# Patient Record
Sex: Female | Born: 1940 | Race: White | Hispanic: No | Smoking: Never smoker
Health system: Southern US, Community
[De-identification: ages and names within clinical notes are randomized; demographics above are authoritative.]

## PROBLEM LIST (undated history)

## (undated) DIAGNOSIS — I4891 Unspecified atrial fibrillation: Secondary | ICD-10-CM

## (undated) DIAGNOSIS — I480 Paroxysmal atrial fibrillation: Secondary | ICD-10-CM

## (undated) DIAGNOSIS — I471 Supraventricular tachycardia, unspecified: Secondary | ICD-10-CM

## (undated) DIAGNOSIS — N183 Chronic kidney disease, stage 3 unspecified: Secondary | ICD-10-CM

## (undated) DIAGNOSIS — I10 Essential (primary) hypertension: Secondary | ICD-10-CM

## (undated) DIAGNOSIS — I440 Atrioventricular block, first degree: Secondary | ICD-10-CM

## (undated) DIAGNOSIS — I495 Sick sinus syndrome: Secondary | ICD-10-CM

## (undated) DIAGNOSIS — D649 Anemia, unspecified: Secondary | ICD-10-CM

## (undated) DIAGNOSIS — I5032 Chronic diastolic (congestive) heart failure: Secondary | ICD-10-CM

## (undated) DIAGNOSIS — C50919 Malignant neoplasm of unspecified site of unspecified female breast: Secondary | ICD-10-CM

## (undated) DIAGNOSIS — I509 Heart failure, unspecified: Secondary | ICD-10-CM

## (undated) DIAGNOSIS — J910 Malignant pleural effusion: Secondary | ICD-10-CM

## (undated) DIAGNOSIS — K219 Gastro-esophageal reflux disease without esophagitis: Secondary | ICD-10-CM

## (undated) DIAGNOSIS — C801 Malignant (primary) neoplasm, unspecified: Secondary | ICD-10-CM

## (undated) HISTORY — DX: Sick sinus syndrome: I49.5

## (undated) HISTORY — PX: CHOLECYSTECTOMY: SHX55

## (undated) HISTORY — DX: Atrioventricular block, first degree: I44.0

---
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
## 1998-07-23 ENCOUNTER — Emergency Department (HOSPITAL_COMMUNITY): Admission: EM | Admit: 1998-07-23 | Discharge: 1998-07-24 | Payer: Self-pay | Admitting: Emergency Medicine

## 2003-07-10 ENCOUNTER — Emergency Department (HOSPITAL_COMMUNITY): Admission: EM | Admit: 2003-07-10 | Discharge: 2003-07-10 | Payer: Self-pay | Admitting: Emergency Medicine

## 2015-12-01 DIAGNOSIS — I10 Essential (primary) hypertension: Secondary | ICD-10-CM | POA: Insufficient documentation

## 2018-02-06 DIAGNOSIS — N183 Chronic kidney disease, stage 3 unspecified: Secondary | ICD-10-CM | POA: Diagnosis present

## 2018-10-05 ENCOUNTER — Encounter (HOSPITAL_COMMUNITY): Payer: Self-pay | Admitting: Emergency Medicine

## 2018-10-05 ENCOUNTER — Emergency Department (HOSPITAL_COMMUNITY)
Admission: EM | Admit: 2018-10-05 | Discharge: 2018-10-05 | Disposition: A | Discharge status: Home / Self Care | Payer: Medicare HMO | Attending: Emergency Medicine | Admitting: Emergency Medicine

## 2018-10-05 ENCOUNTER — Emergency Department (HOSPITAL_COMMUNITY): Payer: Medicare HMO

## 2018-10-05 ENCOUNTER — Other Ambulatory Visit: Payer: Self-pay

## 2018-10-05 DIAGNOSIS — I471 Supraventricular tachycardia: Secondary | ICD-10-CM | POA: Diagnosis not present

## 2018-10-05 DIAGNOSIS — Y929 Unspecified place or not applicable: Secondary | ICD-10-CM | POA: Diagnosis not present

## 2018-10-05 DIAGNOSIS — R0602 Shortness of breath: Secondary | ICD-10-CM | POA: Diagnosis present

## 2018-10-05 DIAGNOSIS — I10 Essential (primary) hypertension: Secondary | ICD-10-CM | POA: Insufficient documentation

## 2018-10-05 DIAGNOSIS — X58XXXA Exposure to other specified factors, initial encounter: Secondary | ICD-10-CM | POA: Diagnosis not present

## 2018-10-05 DIAGNOSIS — S21001A Unspecified open wound of right breast, initial encounter: Secondary | ICD-10-CM | POA: Insufficient documentation

## 2018-10-05 DIAGNOSIS — J181 Lobar pneumonia, unspecified organism: Secondary | ICD-10-CM | POA: Insufficient documentation

## 2018-10-05 DIAGNOSIS — J189 Pneumonia, unspecified organism: Secondary | ICD-10-CM

## 2018-10-05 DIAGNOSIS — Y999 Unspecified external cause status: Secondary | ICD-10-CM | POA: Diagnosis not present

## 2018-10-05 DIAGNOSIS — Z20828 Contact with and (suspected) exposure to other viral communicable diseases: Secondary | ICD-10-CM | POA: Insufficient documentation

## 2018-10-05 DIAGNOSIS — Y939 Activity, unspecified: Secondary | ICD-10-CM | POA: Insufficient documentation

## 2018-10-05 HISTORY — DX: Essential (primary) hypertension: I10

## 2018-10-05 HISTORY — DX: Gastro-esophageal reflux disease without esophagitis: K21.9

## 2018-10-05 LAB — BRAIN NATRIURETIC PEPTIDE: B Natriuretic Peptide: 226.8 pg/mL — ABNORMAL HIGH (ref 0.0–100.0)

## 2018-10-05 LAB — CBC WITH DIFFERENTIAL/PLATELET
Abs Immature Granulocytes: 0.12 10*3/uL — ABNORMAL HIGH (ref 0.00–0.07)
Basophils Absolute: 0.1 10*3/uL (ref 0.0–0.1)
Basophils Relative: 1 %
Eosinophils Absolute: 0.1 10*3/uL (ref 0.0–0.5)
Eosinophils Relative: 1 %
HCT: 39.8 % (ref 36.0–46.0)
Hemoglobin: 12.3 g/dL (ref 12.0–15.0)
Immature Granulocytes: 1 %
Lymphocytes Relative: 14 %
Lymphs Abs: 1.5 10*3/uL (ref 0.7–4.0)
MCH: 29.1 pg (ref 26.0–34.0)
MCHC: 30.9 g/dL (ref 30.0–36.0)
MCV: 94.3 fL (ref 80.0–100.0)
Monocytes Absolute: 0.8 10*3/uL (ref 0.1–1.0)
Monocytes Relative: 8 %
Neutro Abs: 8 10*3/uL — ABNORMAL HIGH (ref 1.7–7.7)
Neutrophils Relative %: 75 %
Platelets: 319 10*3/uL (ref 150–400)
RBC: 4.22 MIL/uL (ref 3.87–5.11)
RDW: 14.7 % (ref 11.5–15.5)
WBC: 10.6 10*3/uL — ABNORMAL HIGH (ref 4.0–10.5)
nRBC: 0 % (ref 0.0–0.2)

## 2018-10-05 LAB — COMPREHENSIVE METABOLIC PANEL WITH GFR
ALT: 20 U/L (ref 0–44)
AST: 20 U/L (ref 15–41)
Albumin: 3.3 g/dL — ABNORMAL LOW (ref 3.5–5.0)
Alkaline Phosphatase: 82 U/L (ref 38–126)
Anion gap: 11 (ref 5–15)
BUN: 15 mg/dL (ref 8–23)
CO2: 25 mmol/L (ref 22–32)
Calcium: 9.2 mg/dL (ref 8.9–10.3)
Chloride: 103 mmol/L (ref 98–111)
Creatinine, Ser: 1.38 mg/dL — ABNORMAL HIGH (ref 0.44–1.00)
GFR calc Af Amer: 42 mL/min — ABNORMAL LOW
GFR calc non Af Amer: 36 mL/min — ABNORMAL LOW
Glucose, Bld: 110 mg/dL — ABNORMAL HIGH (ref 70–99)
Potassium: 4 mmol/L (ref 3.5–5.1)
Sodium: 139 mmol/L (ref 135–145)
Total Bilirubin: 0.6 mg/dL (ref 0.3–1.2)
Total Protein: 6.5 g/dL (ref 6.5–8.1)

## 2018-10-05 LAB — TSH: TSH: 3.441 u[IU]/mL (ref 0.350–4.500)

## 2018-10-05 LAB — T4, FREE: Free T4: 1.1 ng/dL (ref 0.61–1.12)

## 2018-10-05 LAB — SARS CORONAVIRUS 2 BY RT PCR (HOSPITAL ORDER, PERFORMED IN ~~LOC~~ HOSPITAL LAB): SARS Coronavirus 2: NEGATIVE

## 2018-10-05 MED ORDER — CEPHALEXIN 500 MG PO CAPS: 500.0000 mg | ORAL_CAPSULE | Freq: Two times a day (BID) | ORAL | 0 refills | Status: DC

## 2018-10-05 MED ORDER — FLUCONAZOLE 100 MG PO TABS
100.0000 mg | ORAL_TABLET | Freq: Once | ORAL | Status: DC
  Filled 2018-10-05: qty 1

## 2018-10-05 MED ORDER — DOXYCYCLINE HYCLATE 100 MG PO TABS
100.0000 mg | ORAL_TABLET | Freq: Once | ORAL | Status: AC
  Administered 2018-10-05: 100 mg via ORAL
  Filled 2018-10-05: qty 1

## 2018-10-05 MED ORDER — DOXYCYCLINE HYCLATE 100 MG PO CAPS: 100.0000 mg | ORAL_CAPSULE | Freq: Two times a day (BID) | ORAL | 0 refills | Status: DC

## 2018-10-05 MED ORDER — CEPHALEXIN 250 MG PO CAPS
500.0000 mg | ORAL_CAPSULE | Freq: Once | ORAL | Status: AC
  Administered 2018-10-05: 500 mg via ORAL
  Filled 2018-10-05: qty 2

## 2018-10-05 NOTE — ED Notes (Signed)
Pt verbalizes understanding of this visit to the ED, follow-up care and consultations discussed with pt, medications and prescriptions were reviewed. Opportunity for questions and answers provided.

## 2018-10-05 NOTE — ED Provider Notes (Signed)
[facility] DEPART[facility] Provider Note   CSN: [id number] Arrival date & time: 10/05/18  1648     History   Chief Complaint Chief Complaint  Patient presents with  . Shortness of Breath  . Tachycardia    HPI Beverly Tucker is a 78 y.o. female.     HPI   78 year old female with a history of hypertension, situational anxiety, chronic knee pain, GERD who presents with concern for shortness of breath.  Reports has had dyspnea for approximately 2 weeks.  It is worse with exertion and when she lays down flat.  Notes some bilateral lower extremity swelling.  She is also had cough, and sensation of something in the back of her throat.  Cough has been nonproductive but feels like she needs to cough something up.  Feels like the shortness of breath is coming from the back of her throat.  Reports some nasal congestion.  Denies any fevers, nausea, vomiting, diarrhea, black or bloody stools.  Her primary care physician put her on prednisone and she believes another antibiotic.  Denies taking any other over-the-counter cough or cold medications.  No known sick contacts over this with COVID-19.  No history of cardiac arrhythmia or palpitations.  Reports she is normally very active, mowing her lawn, but has not felt up to it in the last 2 weeks.  Reports for the last 3 to 4 weeks, she has had worsening changes under her right breast.  Describes initially redness in the area that she thought was secondary to her underwire bra.  Reports that she had stopped wearing the underwire bra, but believes she was out in the heat, and that sweat and moisture made the problem under her right breast worse.  She now has ulceration and some draining.  Denies any significant pain in the area, reports that she has more pruritus.  Has never had anything like this before.  She has not yet seen her physician regarding this.  Past Medical History:  Diagnosis Date  . GERD (gastroesophageal reflux  disease)   . Hypertension        OB History   No obstetric history on file.      Home Medications    Prior to Admission medications   Medication Sig Start Date End Date Taking? Authorizing Provider  cephALEXin (KEFLEX) 500 MG capsule Take 1 capsule (500 mg total) by mouth 2 (two) times daily. 10/05/18   Gareth Morgan, MD  doxycycline (VIBRAMYCIN) 100 MG capsule Take 1 capsule (100 mg total) by mouth 2 (two) times daily. 10/05/18   Gareth Morgan, MD    Family History No family history on file.  Social History Social History   Tobacco Use  . Smoking status: Not on file  Substance Use Topics  . Alcohol use: Not on file  . Drug use: Not on file     Allergies   Patient has no known allergies.   Review of Systems Review of Systems  Constitutional: Positive for fatigue. Negative for fever.  HENT: Positive for congestion. Negative for sore throat.   Eyes: Negative for visual disturbance.  Respiratory: Positive for cough and shortness of breath.   Cardiovascular: Positive for palpitations and leg swelling. Negative for chest pain.  Gastrointestinal: Negative for abdominal pain, nausea and vomiting.  Genitourinary: Negative for difficulty urinating.  Musculoskeletal: Negative for back pain and neck pain.  Skin: Positive for wound. Negative for rash.  Neurological: Negative for syncope and headaches.     Physical  Exam Updated Vital Signs BP (!) 168/74 (BP Location: Right Arm)   Pulse 86   Temp 98.3 F (36.8 C) (Oral)   Resp (!) 24   SpO2 96%   Physical Exam Vitals signs and nursing note reviewed.  Constitutional:      General: She is not in acute distress.    Appearance: She is well-developed. She is not diaphoretic.  HENT:     Head: Normocephalic and atraumatic.  Eyes:     Conjunctiva/sclera: Conjunctivae normal.  Neck:     Musculoskeletal: Normal range of motion.  Cardiovascular:     Rate and Rhythm: Regular rhythm. Tachycardia present.     Heart  sounds: Normal heart sounds. No murmur. No friction rub. No gallop.   Pulmonary:     Effort: Pulmonary effort is normal. No respiratory distress.     Breath sounds: Rales present. No wheezing.  Chest:     Comments: Right breast underside with erythema, ulcerations, nodularity, no fluctuance Abdominal:     General: There is no distension.     Palpations: Abdomen is soft.     Tenderness: There is no abdominal tenderness. There is no guarding.  Musculoskeletal:        General: No tenderness.  Skin:    General: Skin is warm and dry.     Findings: No erythema or rash.  Neurological:     Mental Status: She is alert and oriented to person, place, and time.      ED Treatments / Results  Labs (all labs ordered are listed, but only abnormal results are displayed) Labs Reviewed  CBC WITH DIFFERENTIAL/PLATELET - Abnormal; Notable for the following components:      Result Value   WBC 10.6 (*)    Neutro Abs 8.0 (*)    Abs Immature Granulocytes 0.12 (*)    All other components within normal limits  COMPREHENSIVE METABOLIC PANEL - Abnormal; Notable for the following components:   Glucose, Bld 110 (*)    Creatinine, Ser 1.38 (*)    Albumin 3.3 (*)    GFR calc non Af Amer 36 (*)    GFR calc Af Amer 42 (*)    All other components within normal limits  BRAIN NATRIURETIC PEPTIDE - Abnormal; Notable for the following components:   B Natriuretic Peptide 226.8 (*)    All other components within normal limits  SARS CORONAVIRUS 2 (HOSPITAL ORDER, [facility])  TSH  T4, FREE    EKG EKG Interpretation  Date/Time:  Saturday October 05 2018 17:23:59 EDT Ventricular Rate:  92 PR Interval:    QRS Duration: 100 QT Interval:  331 QTC Calculation: 410 R Axis:   -30 Text Interpretation:  Sinus rhythm Atrial premature complex Prolonged PR interval Left axis deviation Low voltage, precordial leads Abnormal R-wave progression, early transition Since prior ECG, rhythm is  now sinus, rate has slowed Confirmed by Gareth Morgan [phone]) on 10/05/2018 5:39:43 PM   Radiology Dg Chest Portable 1 View  Result Date: 10/05/2018 CLINICAL DATA:  Shortness of breath for 2 weeks. EXAM: PORTABLE CHEST 1 VIEW COMPARISON:  None. FINDINGS: External pacing wires overlie the thorax. Cardiomediastinal silhouette is normal. Mediastinal contours appear intact. Left lower lobe airspace consolidation versus atelectasis. Osseous structures are without acute abnormality. Soft tissues are grossly normal. IMPRESSION: Left lower lobe airspace consolidation versus atelectasis. Electronically Signed   By: Fidela Salisbury M.D.   On: 10/05/2018 18:08    Procedures .Critical Care Performed by: Gareth Morgan, MD  Authorized by: Gareth Morgan, MD   Critical care provider statement:    Critical care time (minutes):  30   Critical care was time spent personally by me on the following activities:  Discussions with consultants, evaluation of patient's response to treatment, examination of patient, ordering and performing treatments and interventions, ordering and review of laboratory studies, ordering and review of radiographic studies, pulse oximetry, re-evaluation of patient's condition, obtaining history from patient or surrogate and review of old charts   (including critical care time)  Medications Ordered in ED Medications  fluconazole (DIFLUCAN) tablet 100 mg (100 mg Oral Not Given 10/05/18 2125)  doxycycline (VIBRA-TABS) tablet 100 mg (100 mg Oral Given 10/05/18 2046)  cephALEXin (KEFLEX) capsule 500 mg (500 mg Oral Given 10/05/18 2046)     Initial Impression / Assessment and Plan / ED Course  I have reviewed the triage vital signs and the nursing notes.  Pertinent labs & imaging results that were available during my care of the patient were reviewed by me and considered in my medical decision making (see chart for details).        78 year old female with a history of  hypertension, situational anxiety, chronic knee pain, GERD who presents with concern for shortness of breath.  On arrival to the emergency department, patient is in a supraventricular tachycardia.  On arrival, heart rate is in the 150s, and blood pressures down into the 0000000 systolic.    Attempted vagal maneuvers without change into sinus rhythm, however shortly after attempted vagal maneuvers, as we are discussing adenosine, patient converted to a normal sinus rhythm and her blood pressures improved.  Labs completed showing Cr of 1.38, last 1 in 05/2017. WBC 10.9. BNP 200s.  Chest x-ray shows left sided pneumonia. Bicarb normal, no sign of acidosis.   COVID19 testing negative.  Patient remained in sinus rhythm while in the emergency department, without hypoxia, breathing comfortably.  Do not see indication for admission at this time, however discussed strict return precautions.  For SVT, recommend Cardiology follow up and discussed this with Dr. Radford Pax who also looked at initial ECG. Suspect this was brought on by pneumonia by history. Given doxycyline, keflex rx for pneumonia.   Also noted patient with ulcerations under right breast with ddx including multiple etiologies. Will cover for possible fungal component with fluconazole, give doxycycline for bacterial infection, recommend wound care and OBGYN consult rec for likely breast center imaging.   Patient to follow-up with cardiology for SVT, primary care physician for pneumonia and recheck of creatinine, and either OB/GYN or primary care physician for evaluation of her breast wound and referral to the breast center, as well as follow-up with wound care.  Beverly Tucker was evaluated in Emergency Department on 10/05/2018 for the symptoms described in the history of present illness. She was evaluated in the context of the global COVID-19 pandemic, which necessitated consideration that the patient might be at risk for infection with the SARS-CoV-2  virus that causes COVID-19. Institutional protocols and algorithms that pertain to the evaluation of patients at risk for COVID-19 are in a state of rapid change based on information released by regulatory bodies including the CDC and federal and state organizations. These policies and algorithms were followed during the patient's care in the ED.   Final Clinical Impressions(s) / ED Diagnoses   Final diagnoses:  SVT (supraventricular tachycardia) ([employer])  Community acquired pneumonia of left lower lobe of lung ([facility])  Wound of right breast, initial encounter  ED Discharge Orders         Ordered    cephALEXin (KEFLEX) 500 MG capsule  2 times daily     10/05/18 2043    doxycycline (VIBRAMYCIN) 100 MG capsule  2 times daily     10/05/18 2043           Gareth Morgan, MD 10/05/18 2141

## 2018-10-05 NOTE — ED Notes (Signed)
Patient verbalizes understanding of discharge instructions. Opportunity for questioning and answers were provided. Armband removed by staff, pt discharged from ED.  

## 2018-10-05 NOTE — ED Triage Notes (Signed)
Pt brought in by [facility] from home for SOB x2 weeks. Per EMS pt HR 150 on arrival. Pt denies pain, denies CP. During triage, a wound was noted under pt right breast. Pt states this is from a wire bra that caused irritation x2 weeks ago. Denies pain to the site, dressing changed immediately on arrival to ED. Pt states she has not been evaluated for the wound.

## 2018-10-05 NOTE — ED Notes (Signed)
Called granddaughter to come pick pt up. Granddaughter stated she would be here in 10 min.

## 2018-10-14 ENCOUNTER — Other Ambulatory Visit: Payer: Self-pay

## 2018-10-14 ENCOUNTER — Inpatient Hospital Stay (HOSPITAL_COMMUNITY)
Admission: EM | Admit: 2018-10-14 | Discharge: 2018-10-23 | DRG: 584 | Disposition: A | Discharge status: Home Health | Payer: Medicare HMO | Attending: Internal Medicine | Admitting: Internal Medicine

## 2018-10-14 ENCOUNTER — Emergency Department (HOSPITAL_COMMUNITY): Payer: Medicare HMO

## 2018-10-14 ENCOUNTER — Inpatient Hospital Stay (HOSPITAL_COMMUNITY): Payer: Medicare HMO

## 2018-10-14 ENCOUNTER — Encounter (HOSPITAL_COMMUNITY): Payer: Self-pay | Admitting: Emergency Medicine

## 2018-10-14 DIAGNOSIS — R0902 Hypoxemia: Secondary | ICD-10-CM | POA: Diagnosis not present

## 2018-10-14 DIAGNOSIS — M795 Residual foreign body in soft tissue: Secondary | ICD-10-CM

## 2018-10-14 DIAGNOSIS — R0982 Postnasal drip: Secondary | ICD-10-CM | POA: Diagnosis present

## 2018-10-14 DIAGNOSIS — R590 Localized enlarged lymph nodes: Secondary | ICD-10-CM | POA: Diagnosis present

## 2018-10-14 DIAGNOSIS — R7881 Bacteremia: Secondary | ICD-10-CM

## 2018-10-14 DIAGNOSIS — I129 Hypertensive chronic kidney disease with stage 1 through stage 4 chronic kidney disease, or unspecified chronic kidney disease: Secondary | ICD-10-CM | POA: Diagnosis present

## 2018-10-14 DIAGNOSIS — J910 Malignant pleural effusion: Secondary | ICD-10-CM | POA: Diagnosis present

## 2018-10-14 DIAGNOSIS — I4891 Unspecified atrial fibrillation: Secondary | ICD-10-CM | POA: Diagnosis not present

## 2018-10-14 DIAGNOSIS — Z9049 Acquired absence of other specified parts of digestive tract: Secondary | ICD-10-CM | POA: Diagnosis not present

## 2018-10-14 DIAGNOSIS — J90 Pleural effusion, not elsewhere classified: Secondary | ICD-10-CM

## 2018-10-14 DIAGNOSIS — Z20828 Contact with and (suspected) exposure to other viral communicable diseases: Secondary | ICD-10-CM | POA: Diagnosis present

## 2018-10-14 DIAGNOSIS — C7951 Secondary malignant neoplasm of bone: Secondary | ICD-10-CM | POA: Diagnosis present

## 2018-10-14 DIAGNOSIS — J939 Pneumothorax, unspecified: Secondary | ICD-10-CM

## 2018-10-14 DIAGNOSIS — F419 Anxiety disorder, unspecified: Secondary | ICD-10-CM | POA: Diagnosis present

## 2018-10-14 DIAGNOSIS — R7989 Other specified abnormal findings of blood chemistry: Secondary | ICD-10-CM | POA: Diagnosis not present

## 2018-10-14 DIAGNOSIS — N183 Chronic kidney disease, stage 3 unspecified: Secondary | ICD-10-CM | POA: Diagnosis present

## 2018-10-14 DIAGNOSIS — Z170 Estrogen receptor positive status [ER+]: Secondary | ICD-10-CM

## 2018-10-14 DIAGNOSIS — K219 Gastro-esophageal reflux disease without esophagitis: Secondary | ICD-10-CM | POA: Diagnosis present

## 2018-10-14 DIAGNOSIS — R918 Other nonspecific abnormal finding of lung field: Secondary | ICD-10-CM | POA: Diagnosis present

## 2018-10-14 DIAGNOSIS — R0602 Shortness of breath: Secondary | ICD-10-CM

## 2018-10-14 DIAGNOSIS — I482 Chronic atrial fibrillation, unspecified: Secondary | ICD-10-CM | POA: Diagnosis not present

## 2018-10-14 DIAGNOSIS — I2510 Atherosclerotic heart disease of native coronary artery without angina pectoris: Secondary | ICD-10-CM | POA: Diagnosis present

## 2018-10-14 DIAGNOSIS — N6310 Unspecified lump in the right breast, unspecified quadrant: Secondary | ICD-10-CM | POA: Diagnosis not present

## 2018-10-14 DIAGNOSIS — Z800 Family history of malignant neoplasm of digestive organs: Secondary | ICD-10-CM | POA: Diagnosis not present

## 2018-10-14 DIAGNOSIS — C50911 Malignant neoplasm of unspecified site of right female breast: Secondary | ICD-10-CM | POA: Diagnosis present

## 2018-10-14 DIAGNOSIS — N189 Chronic kidney disease, unspecified: Secondary | ICD-10-CM | POA: Diagnosis not present

## 2018-10-14 DIAGNOSIS — J9811 Atelectasis: Secondary | ICD-10-CM | POA: Diagnosis not present

## 2018-10-14 DIAGNOSIS — R112 Nausea with vomiting, unspecified: Secondary | ICD-10-CM

## 2018-10-14 DIAGNOSIS — I509 Heart failure, unspecified: Secondary | ICD-10-CM | POA: Diagnosis not present

## 2018-10-14 DIAGNOSIS — Z79899 Other long term (current) drug therapy: Secondary | ICD-10-CM | POA: Diagnosis not present

## 2018-10-14 DIAGNOSIS — Z7901 Long term (current) use of anticoagulants: Secondary | ICD-10-CM | POA: Diagnosis not present

## 2018-10-14 DIAGNOSIS — I10 Essential (primary) hypertension: Secondary | ICD-10-CM | POA: Diagnosis not present

## 2018-10-14 DIAGNOSIS — R079 Chest pain, unspecified: Secondary | ICD-10-CM

## 2018-10-14 DIAGNOSIS — C50919 Malignant neoplasm of unspecified site of unspecified female breast: Secondary | ICD-10-CM | POA: Diagnosis not present

## 2018-10-14 DIAGNOSIS — R05 Cough: Secondary | ICD-10-CM

## 2018-10-14 DIAGNOSIS — I130 Hypertensive heart and chronic kidney disease with heart failure and stage 1 through stage 4 chronic kidney disease, or unspecified chronic kidney disease: Secondary | ICD-10-CM | POA: Diagnosis not present

## 2018-10-14 DIAGNOSIS — R059 Cough, unspecified: Secondary | ICD-10-CM

## 2018-10-14 DIAGNOSIS — R0600 Dyspnea, unspecified: Secondary | ICD-10-CM

## 2018-10-14 LAB — CBC WITH DIFFERENTIAL/PLATELET
Abs Immature Granulocytes: 0.1 10*3/uL — ABNORMAL HIGH (ref 0.00–0.07)
Basophils Absolute: 0.1 10*3/uL (ref 0.0–0.1)
Basophils Relative: 1 %
Eosinophils Absolute: 0.1 10*3/uL (ref 0.0–0.5)
Eosinophils Relative: 1 %
HCT: 40.5 % (ref 36.0–46.0)
Hemoglobin: 12.6 g/dL (ref 12.0–15.0)
Immature Granulocytes: 1 %
Lymphocytes Relative: 16 %
Lymphs Abs: 1.6 10*3/uL (ref 0.7–4.0)
MCH: 29.3 pg (ref 26.0–34.0)
MCHC: 31.1 g/dL (ref 30.0–36.0)
MCV: 94.2 fL (ref 80.0–100.0)
Monocytes Absolute: 0.8 10*3/uL (ref 0.1–1.0)
Monocytes Relative: 8 %
Neutro Abs: 7.7 10*3/uL (ref 1.7–7.7)
Neutrophils Relative %: 73 %
Platelets: 334 10*3/uL (ref 150–400)
RBC: 4.3 MIL/uL (ref 3.87–5.11)
RDW: 14.9 % (ref 11.5–15.5)
WBC: 10.3 10*3/uL (ref 4.0–10.5)
nRBC: 0 % (ref 0.0–0.2)

## 2018-10-14 LAB — COMPREHENSIVE METABOLIC PANEL
ALT: 16 U/L (ref 0–44)
AST: 20 U/L (ref 15–41)
Albumin: 3.3 g/dL — ABNORMAL LOW (ref 3.5–5.0)
Alkaline Phosphatase: 97 U/L (ref 38–126)
Anion gap: 13 (ref 5–15)
BUN: 24 mg/dL — ABNORMAL HIGH (ref 8–23)
CO2: 27 mmol/L (ref 22–32)
Calcium: 9.3 mg/dL (ref 8.9–10.3)
Chloride: 100 mmol/L (ref 98–111)
Creatinine, Ser: 1.34 mg/dL — ABNORMAL HIGH (ref 0.44–1.00)
GFR calc Af Amer: 44 mL/min — ABNORMAL LOW (ref >60)
GFR calc non Af Amer: 38 mL/min — ABNORMAL LOW (ref >60)
Glucose, Bld: 111 mg/dL — ABNORMAL HIGH (ref 70–99)
Potassium: 4 mmol/L (ref 3.5–5.1)
Sodium: 140 mmol/L (ref 135–145)
Total Bilirubin: 0.7 mg/dL (ref 0.3–1.2)
Total Protein: 6.6 g/dL (ref 6.5–8.1)

## 2018-10-14 LAB — LACTIC ACID, PLASMA
Lactic Acid, Venous: 3 mmol/L (ref 0.5–1.9)
Lactic Acid, Venous: 1.9 mmol/L (ref 0.5–1.9)

## 2018-10-14 LAB — BRAIN NATRIURETIC PEPTIDE: B Natriuretic Peptide: 458.5 pg/mL — ABNORMAL HIGH (ref 0.0–100.0)

## 2018-10-14 LAB — SARS CORONAVIRUS 2 BY RT PCR (HOSPITAL ORDER, PERFORMED IN ~~LOC~~ HOSPITAL LAB): SARS Coronavirus 2: NEGATIVE

## 2018-10-14 MED ORDER — ENOXAPARIN SODIUM 40 MG/0.4ML ~~LOC~~ SOLN
40.0000 mg | SUBCUTANEOUS | Status: DC
  Administered 2018-10-15 – 2018-10-16 (×2): 40 mg via SUBCUTANEOUS
  Filled 2018-10-14 (×3): qty 0.4

## 2018-10-14 MED ORDER — ALPRAZOLAM 0.25 MG PO TABS
0.2500 mg | ORAL_TABLET | Freq: Two times a day (BID) | ORAL | Status: DC | PRN
  Administered 2018-10-14 – 2018-10-16 (×2): 0.5 mg via ORAL
  Administered 2018-10-18: 0.25 mg via ORAL
  Administered 2018-10-20 – 2018-10-22 (×4): 0.5 mg via ORAL
  Filled 2018-10-14 (×2): qty 2
  Filled 2018-10-14: qty 1
  Filled 2018-10-14 (×4): qty 2

## 2018-10-14 MED ORDER — VANCOMYCIN HCL 10 G IV SOLR
1750.0000 mg | Freq: Once | INTRAVENOUS | Status: DC
  Filled 2018-10-14: qty 1750

## 2018-10-14 MED ORDER — PIPERACILLIN-TAZOBACTAM 4.5 G IVPB: 4.5000 g | Freq: Once | INTRAVENOUS | Status: DC

## 2018-10-14 MED ORDER — ACETAMINOPHEN 325 MG PO TABS
650.0000 mg | ORAL_TABLET | Freq: Four times a day (QID) | ORAL | Status: DC | PRN
  Administered 2018-10-15 (×2): 650 mg via ORAL
  Filled 2018-10-14 (×3): qty 2

## 2018-10-14 MED ORDER — FUROSEMIDE 10 MG/ML IJ SOLN
40.0000 mg | Freq: Every day | INTRAMUSCULAR | Status: DC
  Administered 2018-10-14 – 2018-10-15 (×2): 40 mg via INTRAVENOUS
  Filled 2018-10-14 (×2): qty 4

## 2018-10-14 MED ORDER — METOPROLOL TARTRATE 25 MG PO TABS: 50.0000 mg | ORAL_TABLET | Freq: Two times a day (BID) | ORAL | Status: DC

## 2018-10-14 MED ORDER — ACETAMINOPHEN 650 MG RE SUPP: 650.0000 mg | Freq: Four times a day (QID) | RECTAL | Status: DC | PRN

## 2018-10-14 MED ORDER — FUROSEMIDE 10 MG/ML IJ SOLN: 40.0000 mg | Freq: Once | INTRAMUSCULAR | Status: DC

## 2018-10-14 MED ORDER — PIPERACILLIN-TAZOBACTAM 3.375 G IVPB 30 MIN: 3.3750 g | Freq: Once | INTRAVENOUS | Status: DC

## 2018-10-14 NOTE — H&P (Addendum)
Date: 10/14/2018               Patient Name:  Beverly Tucker MRN: [id number]  DOB: Jun 11, 1940 Age / Sex: 78 y.o., female   PCP: Dr. Evette Doffing         Medical Service: Internal Medicine Teaching Service         Attending Physician: Dr. Evette Doffing, Mallie Mussel, *    First Contact: Dr. Jeanmarie Hubert Pager: [phone]  Second Contact: Dr. Kathi Ludwig Pager: [phone]       After Hours (After 5p/  First Contact Pager: [phone]  weekends / holidays): Second Contact Pager: [phone]   Chief Complaint: Shortness of breath  History of Present Illness: Beverly Tucker is a 78 year old female with past medical history of hypertension who presents with a 3-week history of progressive worsening of shortness of breath.  Patient reports that symptoms started with a runny nose and cough productive of clear white mucus.  Patient saw outpatient provider, was COVID negative, was started on allergy medication.  Shortness of breath continue to progress, saw provider on 9/12, was diagnosed with pneumonia, and started on Keflex.  At that visit, patient was also started on doxycycline for cellulitis (under right breast).  Patient reports taking both medications consistently every day.  Patient reports that shortness of breath is worse with exertion, worse when lying flat, reports sleeping with 2 pillows, denies PND symptoms.  Patient denies fever, chills, muscle aches, weight loss/gain, abdominal pain, urinary symptoms, diarrhea.  Reports 1 episode of nonbilious, nonbloody vomit last Saturday.  Patient reports lower extremity swelling over the past 2 days.  States last colonoscopy was 3 to 4 years ago, not sure when she is supposed to follow-up again.  Patient reports never having a mammogram.  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 Patient reports lesion under right breast which is non-tender and has been  developing for the past couple of months. She has been taking doxycycline as this was thought to be cellulitis.  PCP is Beverly Tucker with [facility]  Meds:  Current Meds  Medication Sig   ALPRAZolam (XANAX) 0.5 MG tablet Take 0.25-0.5 mg by mouth 2 (two) times daily as needed for anxiety.   cephALEXin (KEFLEX) 500 MG capsule Take 1 capsule (500 mg total) by mouth 2 (two) times daily.   doxycycline (VIBRAMYCIN) 100 MG capsule Take 1 capsule (100 mg total) by mouth 2 (two) times daily.   lisinopril-hydrochlorothiazide (ZESTORETIC) 10-12.5 MG tablet Take 1 tablet by mouth daily.   metoprolol tartrate (LOPRESSOR) 50 MG tablet Take 50 mg by mouth 2 (two) times daily.     Allergies: * Denies allergies to medications  Past Medical History: * Hypertension * Anxiety   Family History:  * Mother had liver cancer * Denies family history of breast cancer * Denies family history of colon cancer  Social History:  * Denies alcohol use * Denies ever smoking * Denies non-prescription drug use * Lives at home by herself, drives, performs all ADL/IADLs independently, child lives nearby * Retired, used to work for [employer]: A complete ROS was negative except as per HPI.  Physical Exam: Blood pressure (!) 150/64, pulse 73, temperature 99 F (37.2 C), temperature source Rectal, resp. rate (!) 31, height 5\' 6"  (1.676 m), weight 89.4 kg, SpO2 94 %. Physical Exam  Constitutional: She is well-developed, well-nourished,  and in no distress.  HENT:  Head: Normocephalic and atraumatic.  Eyes: EOM are normal. Right eye exhibits no discharge. Left eye exhibits no discharge.  Neck: Normal range of motion. No tracheal deviation present.  Cardiovascular: Normal rate and regular rhythm. Exam reveals no gallop and no friction rub.  No murmur heard. Pulmonary/Chest: Effort normal and breath sounds normal. No respiratory distress. She has no wheezes. She has  no rales.  Abdominal: Soft. She exhibits no distension. There is no abdominal tenderness. There is no rebound and no guarding.  Musculoskeletal: Normal range of motion.        General: Edema (2+ pitting edema up to mid-shins) present. No tenderness or deformity.  Neurological: She is alert. Coordination normal.  Skin: Skin is warm and dry. No rash noted. She is not diaphoretic. No erythema.  Erythematous, nodular, raised lesion under right breast - see photo below  Psychiatric: Memory and judgment normal.       EKG: personally reviewed my interpretation is normal sinus rhythm without signs of acute ischemia  CXR: personally reviewed my interpretation is moderate left-sided pleural effusion with possible associated atelectasis vs. airspace disease in bilateral bases.  Assessment & Plan by Problem: Principal Problem:   Acute CHF (congestive heart failure) (HCC) Active Problems:   Mass of breast, right   CKD (chronic kidney disease) stage 3, GFR 30-59 ml/min (HCC)   Pleural effusion, left  Patient is a 78 year old female with past medical history of hypertension and anxiety who presents with a 3-week history of progressive worsening shortness of breath.  # Acute CHF: Patient with 3-week history of progressively worsening shortness of breath.  Patient with left-sided pleural effusion and bibasilar airspace disease.  However, patient has normal white count, no fever, or malaise which lowers the suspicion for pneumonia.  Patient without history of smoking or chemical exposure. Saturating well on room air, no chest pain.  Patient does have 2+ pitting edema up to the mid shins, orthopnea, and BNP of 458. No history of heart failure or prior echocardiograms in system.  * Diurese with lasix 40 mg IV * Strict I/Os, daily weights, will monitor electrolytes. Potassium of 4.0 on presentation, will check Mag in AM * TTE to evaluate for heart failure * CT Chest to further characterize possible  pneumonia vs. Malignancy  # Right breast lesion: Raised, nodular, erythematous lesion (see picture above) under right breast which patient reports started a couple months ago. Wound is non-tender, without significant purulence, and non-painful. It is not characteristic of cellulitis. There is concern for malignant process. Will further evaluate with CT scan of the chest.   # Hypertension: On lisinopril-hydrochlorothiazide 10-12.5mg  QD at home. Systolics in the [id number] on presentation. Will hold home medications as we diurese patient.  * Lasix 40mg  IV QD  DVT Ppx: Lovenox 40 mg QD Diet: Heart diet Dispo: Admit patient to Inpatient with expected length of stay greater than 2 midnights.  Signed: Jeanmarie Hubert, MD 10/14/2018, 6:48 PM  Pager: [phone]

## 2018-10-14 NOTE — ED Provider Notes (Addendum)
[facility] EMERGENCY DEPARTMENT Provider Note   CSN: [id number] Arrival date & time: 10/14/18  1354     History   Chief Complaint Chief Complaint  Patient presents with  . Chest Pain    HPI Beverly Tucker is a 78 y.o. female with Hx of HTN presenting to the ED with complaints of shortness of breath and weakness. History obtained from the patient who is a poor historian and is unclear of exact timeline of events. Patient was last seen in the ED on 9/5 for dyspnea and found to have left sided pneumonia without signs of acidosis. COVID negative. Patient  Discharged to home with Keflex and Doxycycline. Patient reports that her symptoms have been improving; however, she does not feel like she is getting better as quickly as she would like. She still feels weak and unable to take care of herself. She continues to have productive cough that is improving and has some postnasal drip since Saturday. She endorses some dyspnea on exertion that has improved since her ED visit. Patient reports some increased ankle edema. She denies any fevers, chills, chest pain, recent COVID contacts, abdominal pain, nausea/vomiting, or diarrhea.    Past Medical History:  Diagnosis Date  . GERD (gastroesophageal reflux disease)   . Hypertension     There are no active problems to display for this patient.   History reviewed. No pertinent surgical history.   OB History   No obstetric history on file.      Home Medications    Prior to Admission medications   Medication Sig Start Date End Date Taking? Authorizing Provider  cephALEXin (KEFLEX) 500 MG capsule Take 1 capsule (500 mg total) by mouth 2 (two) times daily. 10/05/18   Gareth Morgan, MD  doxycycline (VIBRAMYCIN) 100 MG capsule Take 1 capsule (100 mg total) by mouth 2 (two) times daily. 10/05/18   Gareth Morgan, MD    Family History History reviewed. No pertinent family history.  Social History Social History   Tobacco  Use  . Smoking status: Not on file  Substance Use Topics  . Alcohol use: Not on file  . Drug use: Not on file     Allergies   Patient has no known allergies.   Review of Systems Review of Systems  Constitutional: Positive for fatigue. Negative for appetite change, chills and fever.  HENT: Positive for postnasal drip. Negative for congestion, rhinorrhea, sneezing and sore throat.   Eyes: Negative.   Respiratory: Positive for cough and shortness of breath. Negative for chest tightness and wheezing.        Productive cough  Cardiovascular: Positive for leg swelling. Negative for chest pain and palpitations.  Gastrointestinal: Positive for abdominal distention. Negative for abdominal pain, diarrhea, nausea and vomiting.  Endocrine: Negative.   Genitourinary: Negative.   Musculoskeletal: Negative.   Skin: Negative.   Allergic/Immunologic: Negative.   Neurological: Negative for dizziness, numbness and headaches.  Hematological: Negative.   Psychiatric/Behavioral: Negative.     Physical Exam Updated Vital Signs BP (!) 134/55   Pulse 66   Temp 99 F (37.2 C) (Rectal)   Resp (!) 25   SpO2 93%   Physical Exam Constitutional:      General: She is not in acute distress.    Appearance: She is ill-appearing.  HENT:     Head: Normocephalic and atraumatic.  Eyes:     Extraocular Movements: Extraocular movements intact.     Pupils: Pupils are equal, round, and reactive to light.  Cardiovascular:     Rate and Rhythm: Normal rate and regular rhythm.     Heart sounds: Heart sounds not distant. No murmur. No systolic murmur. No friction rub. No gallop.   Pulmonary:     Effort: Pulmonary effort is normal. No tachypnea, accessory muscle usage or respiratory distress.     Breath sounds: Examination of the left-lower field reveals decreased breath sounds. Decreased breath sounds present. No rhonchi or rales.  Chest:     Chest wall: Mass present. No tenderness.     Comments: Fungating  mass present medial to and underneath right breast (see below) Abdominal:     General: Bowel sounds are normal.     Palpations: Abdomen is soft.     Tenderness: There is no abdominal tenderness. There is no guarding.  Musculoskeletal:     Right lower leg: She exhibits no tenderness. Edema present.     Left lower leg: She exhibits no tenderness. Edema present.     Comments: 1+ pitting edema bilaterally  Skin:    General: Skin is warm and dry.     Capillary Refill: Capillary refill takes less than 2 seconds.  Neurological:     General: No focal deficit present.     Mental Status: She is alert and oriented to person, place, and time.       ED Treatments / Results  Labs (all labs ordered are listed, but only abnormal results are displayed) Labs Reviewed  CULTURE, BLOOD (ROUTINE X 2)  CULTURE, BLOOD (ROUTINE X 2)  SARS CORONAVIRUS 2 (HOSPITAL ORDER, [facility])  CBC WITH DIFFERENTIAL/PLATELET  COMPREHENSIVE METABOLIC PANEL  LACTIC ACID, PLASMA  LACTIC ACID, PLASMA  BRAIN NATRIURETIC PEPTIDE    EKG EKG Interpretation  Date/Time:  Monday October 14 2018 14:09:00 EDT Ventricular Rate:  69 PR Interval:    QRS Duration: 110 QT Interval:  433 QTC Calculation: 464 R Axis:   8 Text Interpretation:  Sinus rhythm Borderline prolonged PR interval Abnormal R-wave progression, early transition Confirmed by Lacretia Leigh ([id number]) on 10/14/2018 2:29:38 PM   Radiology Dg Chest Port 1 View  Result Date: 10/14/2018 CLINICAL DATA:  Shortness of breath EXAM: PORTABLE CHEST 1 VIEW COMPARISON:  Chest radiograph dated 10/05/2018. FINDINGS: The heart appears enlarged accounting for technique. There is a small a moderate left pleural effusion with associated atelectasis/airspace disease. There is mild right basilar atelectasis/airspace disease. There is no pneumothorax. The visualized skeletal structures are unremarkable. IMPRESSION: Small to moderate left pleural  effusion with associated atelectasis/airspace disease. Mild right basilar atelectasis/airspace disease. Cardiomegaly. Electronically Signed   By: Zerita Boers M.D.   On: 10/14/2018 14:38    Procedures Procedures (including critical care time)  Medications Ordered in ED Medications - No data to display   Initial Impression / Assessment and Plan / ED Course  I have reviewed the triage vital signs and the nursing notes.  Pertinent labs & imaging results that were available during my care of the patient were reviewed by me and considered in my medical decision making (see chart for details).  Beverly Tucker is a 78 yo female with Hx of HTN presenting to the ED from home with shortness of breath and weakness.   She was recently seen in the ED on 9/5 for dyspnea and SVTs with spontaneous conversion to NSR. CXR showed left sided pneumonia. Suspected that SVT was induced by pneumonia. Patient was discharged to home with doxycycline and Keflex. Also noted to have ulceration under right  breast and patient discharged with fluconazole, doxycycline and recommended for wound care and OB/GYN consult for further imaging.   Today, patient reports improvement in dyspnea and cough since being in the ED. However, she reports that she does not believe she is getting better as quickly as she would like. She is still having some productive cough that is improving and has some postnasal drip since Saturday. She reports improved dyspnea as well; however continues to have fatigue. On examination, patient appears unkept and disoriented however AOx4. She is afebrile and saturating well on room air. She does not appear to be in any acute distress. There is a nontender fungating mass with ulceration and minimal bleeding present under her right breast. No surrounding erythema. Lung sounds decreased on left side. JVD to mid-neck and 1+ pitting edema at bilateral lower extremities. CXR with left pleural effusion with associated  atelectasis and mild right basilar atelectasis. Cardiomegaly present on CXR.  Suspected patient's symptoms secondary to heart failure vs. malignant pleural effusion.   Lactic acid 3.0. Suspected source of pleural effusion. Patient given vancomycin and zosyn in ED.   BNP 450 (200 on 9/5). As patient appeared volume overloaded and Cr stable since last ED visit, patient given one dose of Lasix.    Patient to be admitted to inpatient service.   Final Clinical Impressions(s) / ED Diagnoses   Final diagnoses:  Chest pain    ED Discharge Orders    None       Harvie Heck, MD 10/14/18 1606    Harvie Heck, MD 10/14/18 1607    Harvie Heck, MD 10/14/18 1630    Lacretia Leigh, MD 10/15/18 1605

## 2018-10-14 NOTE — ED Triage Notes (Addendum)
Pt was at home with SOB and weakness. PNA 10 days ago with antibiotics. Ankles swollen new onset back pain as well. Sats on RA 94-95%. EMS did EKG. Breathing 30 times a minute when EMS arrived. Pt has large wound under left breast that appears like a fungating tumor.

## 2018-10-14 NOTE — ED Notes (Addendum)
Spoke to patient's son [name] over the phone and notified him that patient would be admitted to the hospital for investigation of right breast wound. Patient's son would appreciate phone call with updates on any new information.

## 2018-10-14 NOTE — ED Provider Notes (Signed)
I saw and evaluated the patient, reviewed the resident's note and I agree with the findings and plan.  EKG: EKG Interpretation  Date/Time:  Monday October 14 2018 14:09:00 EDT Ventricular Rate:  69 PR Interval:    QRS Duration: 110 QT Interval:  433 QTC Calculation: 464 R Axis:   8 Text Interpretation:  Sinus rhythm Borderline prolonged PR interval Abnormal R-wave progression, early transition Confirmed by Lacretia Leigh ([id number]) on 10/14/2018 2:83:50 PM  78 year old female presents with shortness of breath and not feeling back to her baseline is being treated for pneumonia in the past.  Chest x-ray shows CHF.  Some pedal edema.as well 2.  Labs pending at this time and patient will be admitted to the hospital   Lacretia Leigh, MD 10/14/18 1506

## 2018-10-14 NOTE — ED Notes (Signed)
Got patient undress on the monitor did ekg shown to Dr Zenia Resides patient is resting with call bell in reach and nurse at bedside

## 2018-10-14 NOTE — ED Notes (Signed)
Attempted to call report and unable due to not having nurse assigned

## 2018-10-15 ENCOUNTER — Inpatient Hospital Stay (HOSPITAL_COMMUNITY): Payer: Medicare HMO

## 2018-10-15 DIAGNOSIS — R7989 Other specified abnormal findings of blood chemistry: Secondary | ICD-10-CM

## 2018-10-15 DIAGNOSIS — R918 Other nonspecific abnormal finding of lung field: Secondary | ICD-10-CM

## 2018-10-15 DIAGNOSIS — I10 Essential (primary) hypertension: Secondary | ICD-10-CM

## 2018-10-15 DIAGNOSIS — R0602 Shortness of breath: Secondary | ICD-10-CM

## 2018-10-15 DIAGNOSIS — F419 Anxiety disorder, unspecified: Secondary | ICD-10-CM

## 2018-10-15 DIAGNOSIS — I509 Heart failure, unspecified: Secondary | ICD-10-CM

## 2018-10-15 DIAGNOSIS — J90 Pleural effusion, not elsewhere classified: Secondary | ICD-10-CM

## 2018-10-15 DIAGNOSIS — N6310 Unspecified lump in the right breast, unspecified quadrant: Secondary | ICD-10-CM | POA: Diagnosis present

## 2018-10-15 LAB — MAGNESIUM: Magnesium: 1.6 mg/dL — ABNORMAL LOW (ref 1.7–2.4)

## 2018-10-15 LAB — CBC
HCT: 37.5 % (ref 36.0–46.0)
Hemoglobin: 11.8 g/dL — ABNORMAL LOW (ref 12.0–15.0)
MCH: 29 pg (ref 26.0–34.0)
MCHC: 31.5 g/dL (ref 30.0–36.0)
MCV: 92.1 fL (ref 80.0–100.0)
Platelets: 328 10*3/uL (ref 150–400)
RBC: 4.07 MIL/uL (ref 3.87–5.11)
RDW: 14.7 % (ref 11.5–15.5)
WBC: 12 10*3/uL — ABNORMAL HIGH (ref 4.0–10.5)
nRBC: 0 % (ref 0.0–0.2)

## 2018-10-15 LAB — COMPREHENSIVE METABOLIC PANEL
ALT: 16 U/L (ref 0–44)
AST: 19 U/L (ref 15–41)
Albumin: 3 g/dL — ABNORMAL LOW (ref 3.5–5.0)
Alkaline Phosphatase: 96 U/L (ref 38–126)
Anion gap: 14 (ref 5–15)
BUN: 23 mg/dL (ref 8–23)
CO2: 28 mmol/L (ref 22–32)
Calcium: 9 mg/dL (ref 8.9–10.3)
Chloride: 99 mmol/L (ref 98–111)
Creatinine, Ser: 1.13 mg/dL — ABNORMAL HIGH (ref 0.44–1.00)
GFR calc Af Amer: 54 mL/min — ABNORMAL LOW (ref >60)
GFR calc non Af Amer: 46 mL/min — ABNORMAL LOW (ref >60)
Glucose, Bld: 121 mg/dL — ABNORMAL HIGH (ref 70–99)
Potassium: 3.4 mmol/L — ABNORMAL LOW (ref 3.5–5.1)
Sodium: 141 mmol/L (ref 135–145)
Total Bilirubin: 0.7 mg/dL (ref 0.3–1.2)
Total Protein: 6.4 g/dL — ABNORMAL LOW (ref 6.5–8.1)

## 2018-10-15 LAB — PROTIME-INR
INR: 1.1 (ref 0.8–1.2)
Prothrombin Time: 14.2 seconds (ref 11.4–15.2)

## 2018-10-15 LAB — ECHOCARDIOGRAM COMPLETE
Height: 66 in
Weight: 3093.49 oz

## 2018-10-15 LAB — LACTATE DEHYDROGENASE, PLEURAL OR PERITONEAL FLUID: LD, Fluid: 149 U/L — ABNORMAL HIGH (ref 3–23)

## 2018-10-15 LAB — BODY FLUID CELL COUNT WITH DIFFERENTIAL
Eos, Fluid: 0 %
Lymphs, Fluid: 95 %
Monocyte-Macrophage-Serous Fluid: 4 % — ABNORMAL LOW (ref 50–90)
Neutrophil Count, Fluid: 1 % (ref 0–25)
Total Nucleated Cell Count, Fluid: 3050 cu mm — ABNORMAL HIGH (ref 0–1000)

## 2018-10-15 LAB — APTT: aPTT: 31 seconds (ref 24–36)

## 2018-10-15 LAB — PROTEIN, TOTAL: Total Protein: 6.2 g/dL — ABNORMAL LOW (ref 6.5–8.1)

## 2018-10-15 LAB — PROTEIN, PLEURAL OR PERITONEAL FLUID: Total protein, fluid: 3.8 g/dL

## 2018-10-15 LAB — LACTATE DEHYDROGENASE: LDH: 138 U/L (ref 98–192)

## 2018-10-15 MED ORDER — POTASSIUM CHLORIDE CRYS ER 20 MEQ PO TBCR
40.0000 meq | EXTENDED_RELEASE_TABLET | Freq: Once | ORAL | Status: AC
  Administered 2018-10-15: 40 meq via ORAL
  Filled 2018-10-15: qty 2

## 2018-10-15 MED ORDER — LIDOCAINE-EPINEPHRINE (PF) 2 %-1:200000 IJ SOLN
10.0000 mL | Freq: Once | INTRAMUSCULAR | Status: DC
  Filled 2018-10-15 (×2): qty 10

## 2018-10-15 MED ORDER — MAGNESIUM SULFATE 50 % IJ SOLN
3.0000 g | Freq: Once | INTRAVENOUS | Status: AC
  Administered 2018-10-15: 3 g via INTRAVENOUS
  Filled 2018-10-15: qty 6

## 2018-10-15 NOTE — Significant Event (Signed)
Rapid Response Event Note  Received a call at 1212 from bedside nurse Leatrice Jewels, RN to notify me about MEWS - 2 (Yellow). Per nurse, this is not an acute change, patient is stable. Pending bedside thoracentesis this afternoon.   NO RRT INTERVENTIONS

## 2018-10-15 NOTE — Progress Notes (Signed)
  Subjective:  Patient reports she is doing okay today.  States she has improvement in her shortness of breath and was able to lie flat without significant symptoms.  We discussed findings from CT scan and concern that skin lesion is not from infection.  Informed patient that we would like to consult surgery.  All questions answered, patient comfortable with plan of care.  Objective:   Vital Signs (last 24 hours): Vitals:   10/14/18 2245 10/14/18 2300 10/14/18 2340 10/15/18 0532  BP: (!) 142/67 (!) 144/68 (!) 159/60 (!) 136/51  Pulse:   85 77  Resp: (!) 32 (!) 30 (!) 22 18  Temp:   98.4 F (36.9 C) 98.5 F (36.9 C)  TempSrc:   Oral Oral  SpO2:  96% 96% 93%  Weight:   87.7 kg   Height:   5\' 6"  (1.676 m)     Physical Exam: General Alert and answers questions appropriately, no acute distress  Cardiac Regular rate and rhythm, no murmurs, rubs, or gallops  Skin Fungating skin mass under right breast, see media for photo  Extremities 1+ pitting edema up to mid-shins    Assessment/Plan:   Active Problems:   Dyspnea  Patient is a 78 year old female with past medical history of hypertension anxiety presents with 3-week history of progressive worsening shortness of breath.  # Possible acute congestive heart failure: Patient presented with shortness of breath. CT of chest does not show convincing evidence for pneumonia, demonstrates moderate left and small right pleural effusions, and findings of interstitial edema.  There are small irregular lung nodules most evident in the right middle lobe, largest approximately 1 cm.  Patient CT findings, symptoms, and lower extremity edema may be secondary to heart failure.  Malignant process may also be involved given patient's skin findings. * Lasix 40 mg IV QD * TTE scheduled for this AM * Potassium 3.4, mag 1.6 - repleted this a.m. * Strict I/O's, daily weights, monitor electrolytes.  # Left-sided pleural effusion: Moderate to large sized  pleural effusion on CT scan without septations on bedside ultrasound.  Concern for malignant effusion considering breast mass. * Plan for bedside thoracentesis today of left pleural effusion-for diagnostic purposes as well as symptomatic relief  #  Fungating right breast mass: Nodular, nontender, fungating mass beneath the right breast, progressive/worsening over the past several months.  Axillary lymphadenopathy seen on CT scan and small pulmonary nodules. * General surgery consulted and will evaluate patient.  Spoke with Dr. Marla Roe with plastic surgery. If surgery is indicated, general surgery would perform excision and Dr. Marla Roe with plastic surgery would likely perform the reconstruction.  # Creatinine elevation: Creatinine of 1.38 on presentation, no recent baseline-level 1.08 in May last year.  Creatinine downtrending today to 1.13.   Diet: Heart diet DVT Ppx: Lovenox 40 mg QD Dispo: Anticipated discharge pending further diagnostic workup  Jeanmarie Hubert, MD 10/15/2018, 7:46 AM Pager: [phone]

## 2018-10-15 NOTE — Progress Notes (Signed)
Internal Medicine Attending:   The left pleural effusion cell count is exudative with lymphocytic predominant white cells. The chest xray shows a very small left apical and base pneumothorax. I suspect this is pneumothorax ex vacuo due to lung that is unable to expand during the thoracentesis. 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 The patient is doing well with no shortness of breath, vitals are ok. Plan is to treat with nitrogen washout, nonrebreathing face mask with 100% FiO2 for 2 hours then repeat the chest xray. As long as the pneumothorax is not expanding, she will not need a chest tube.   Lalla Brothers, MD

## 2018-10-15 NOTE — TOC Initial Note (Signed)
Transition of Care [facility]) - Initial/Assessment Note    Patient Details  Name: Beverly Tucker MRN: [id number] Date of Birth: Nov 06, 1940  [facility] of [facility]) CM/SW Contact:    Marilu Favre, RN Phone Number: 10/15/2018, 12:18 PM  Clinical Narrative:                 Confirmed face sheet information.   Patient's PCP is DR Kathryne Eriksson.  Patient lives alone, her son lives next door.   Patient currently on oxygen , does not have oxygen at home.   Will continue to follow for discharge needs  Expected Discharge Plan: Home/Self Care Barriers to Discharge: Continued Medical Work up   Patient Goals and CMS Choice Patient states their goals for this hospitalization and ongoing recovery are:: to return to home CMS Medicare.gov Compare Post Acute Care list provided to:: Patient Choice offered to / list presented to : NA  Expected Discharge Plan and Services Expected Discharge Plan: Home/Self Care       Living arrangements for the past 2 months: Single Family Home                 DME Arranged: N/A         HH Arranged: NA          Prior Living Arrangements/Services Living arrangements for the past 2 months: Single Family Home Lives with:: Self Patient language and need for interpreter reviewed:: Yes Do you feel safe going back to the place where you live?: Yes      Need for Family Participation in Patient Care: Yes (Comment) Care giver support system in place?: Yes (comment)   Criminal Activity/Legal Involvement Pertinent to Current Situation/Hospitalization: No - Comment as needed  Activities of Daily Living      Permission Sought/Granted                  Emotional Assessment Appearance:: Appears stated age Attitude/Demeanor/Rapport: Engaged Affect (typically observed): Accepting Orientation: : Oriented to Self, Oriented to Place, Oriented to  Time, Oriented to Situation Alcohol / Substance Use: Not Applicable Psych Involvement: No  (comment)  Admission diagnosis:  Pleural effusion [J90] Chest pain [R07.9] Patient Active Problem List   Diagnosis Date Noted  . Mass of breast, right 10/15/2018  . Pleural effusion, left 10/15/2018  . Acute CHF (congestive heart failure) ([place]) 10/15/2018  . CKD (chronic kidney disease) stage 3, GFR 30-59 ml/min ([facility]) 02/06/2018  . Hypertension 12/01/2015   PCP:  Patient, No Pcp Per Pharmacy:   [id number] - [place], [place] - [address] [place] [address] Phone: [phone] Fax: [phone]     Social Determinants of Health (SDOH) Interventions    Readmission Risk Interventions No flowsheet data found.

## 2018-10-15 NOTE — Progress Notes (Signed)
  Echocardiogram 2D Echocardiogram has been performed.  Burnett Kanaris 10/15/2018, 1:40 PM

## 2018-10-15 NOTE — Consult Note (Addendum)
[facility] Surgery Consult Note  Beverly Tucker [birth year]  DN:[id number].    Requesting MD: Jeanmarie Hubert MD Chief Complaint: Short of breath/tachycardic Reason for Consult: Fungating/ulcerated right breast mass  HPI:   Patient is a 78 year old female who presented to the ED on 10/05/2018 she was also found to have ulcerations under her right breast.  She was diagnosed with possible pneumonia and SVT secondary to her pneumonia.  She was treated with Keflex for her shortness of breath/pneumonia.  She was treated with fluconazole and doxycycline with OB/GYN follow-up recommended for her breast changes.  Patient notes she has had something going on under her breasts for some months.  She does not remember being there Christmas last year but is been at least 6 months since she is had problems and quit wearing bras with wiring because it was uncomfortable.  Over the last 2 months the symptoms again become progressively worse.  She has been trying to ignore it, but she knew there was something wrong.  She treated it with some creams she got from the drugstore for a time but they were ineffective. Patient returned to the ED yesterday 10/14/2018, she complained of her ankle swelling, new onset back pain.  Sats were 94 to 95% on room air but respiratory rate was 30/min.  EMS noted a large wound under her breast that appeared to be a fungating tumor.   She reported her symptoms improved after discharge on 10/05/2018 on the Keflex and doxycycline.  She did not feel she was getting better as quickly as she should she felt weak and unable to care for herself.  She has dyspnea on exertion and increased ankle edema.  Work-up in the ED showed a large fungating ulcerated mass with some skin breakdown. She was afebrile her vital signs are stable respiratory rate was in the 2728 range.  Labs shows a potassium of 3.4, glucose of 121, creatinine was improved 1.13 magnesium 1.6 albumin 3.0, total protein 6.4 LFTs were  normal.  WBC 12.0, hemoglobin 11.8, hematocrit 37.5, platelets 328,000.  INR 1.1, PTT 31, COVID is negative,9/14 and 10/05/2018.  EKG showed sinus rhythm with slightly prolonged QTC.  Chest x-ray showed a small to moderate left pleural effusion with associated atelectasis and mild right basilar atelectasis.  Cardiomegaly. CT of the chest shows mild enlargement of the heart but no pericardial effusion.  There is a moderate size left and small right pleural effusion, dependent atelectasis left greater than the right.  Interstitial thickening both lower lobes.  There is a few small irregular nodules in the largest subpleural right lower lobe.   Three-vessel coronary artery calcification.  The chest wall shows a masslike soft tissue contiguous with the skin medial aspect the right breast measuring 5.7 x 3.5 x 4.9 cm.  There is generalized skin thickening bilaterally.  Mildly prominent bilateral axillary lymph nodes.  The abnormal skin changes were thought to be possibly infectious but inflammatory neoplastic was also in the differential.  They recommended mammogram and ultrasound.   Currently she is afebrile respiratory rate still elevated in the upper 20s.  Blood pressure is stable.  Sats are good on room air 94 to 92%.  Labs as noted above.,  WBC is 12,000 echocardiogram has been ordered.  Currently no orders for mammogram or ultrasound.  ROS: Review of Systems  Constitutional: Negative.   HENT: Negative.   Eyes: Negative.   Respiratory: Positive for shortness of breath (Dyspnea on exertion walking over the last couple weeks.). Negative  for cough, hemoptysis, sputum production and wheezing.   Cardiovascular: Positive for leg swelling.       She reports some dyspnea on exertion recently with her leg swelling over the last week or 2.  Gastrointestinal: Negative.   Genitourinary: Negative.   Musculoskeletal: Positive for back pain.  Skin: Positive for itching.       Sounds like she has had some discomfort  and skin changes for at least 6 months.  She says it started with just itching.  Neurological: Negative.   Endo/Heme/Allergies: Negative.   Psychiatric/Behavioral: Negative.   All other systems reviewed and are negative.   History reviewed. No pertinent family history.  Past Medical History:  Diagnosis Date   GERD (gastroesophageal reflux disease)    Hypertension    She is widowed and lives alone but her son lives next door. Tobacco: None Drugs: None EtOH: None  Family history: Mother died at 37 with liver cancer.  Father died with pneumonia.  She has 2 half brothers both of whom had cardiac issues.  She has 2 sons in good health and 4 grandchildren.  History reviewed. No pertinent surgical history.  Social History:  has no history on file for tobacco, alcohol, and drug.  Allergies: No Known Allergies  Medications Prior to Admission  Medication Sig Dispense Refill   ALPRAZolam (XANAX) 0.5 MG tablet Take 0.25-0.5 mg by mouth 2 (two) times daily as needed for anxiety.     cephALEXin (KEFLEX) 500 MG capsule Take 1 capsule (500 mg total) by mouth 2 (two) times daily. 28 capsule 0   doxycycline (VIBRAMYCIN) 100 MG capsule Take 1 capsule (100 mg total) by mouth 2 (two) times daily. 20 capsule 0   lisinopril-hydrochlorothiazide (ZESTORETIC) 10-12.5 MG tablet Take 1 tablet by mouth daily.     metoprolol tartrate (LOPRESSOR) 50 MG tablet Take 50 mg by mouth 2 (two) times daily.      Blood pressure 136/68, pulse 96, temperature 99.6 F (37.6 C), temperature source Oral, resp. rate (!) 28, height 5\' 6"  (1.676 m), weight 87.7 kg, SpO2 92 %. Physical Exam: Physical Exam Constitutional:      General: She is not in acute distress.    Appearance: She is well-developed. She is not ill-appearing, toxic-appearing or diaphoretic.     Comments: She does not appear acutely ill but she is fairly anxious and knows there is a real problem.  HENT:     Head: Normocephalic and atraumatic.    Eyes:     Comments: Pupils are equal  Neck:     Musculoskeletal: Normal range of motion and neck supple.     Thyroid: No thyromegaly.     Vascular: No hepatojugular reflux or JVD.     Trachea: No tracheal deviation.  Cardiovascular:     Rate and Rhythm: Regular rhythm. Tachycardia present.     Heart sounds: Normal heart sounds. No murmur.     Comments: Pulses are +2 and equal bilaterally Pulmonary:     Effort: Pulmonary effort is normal. No tachypnea, accessory muscle usage or respiratory distress.     Breath sounds: Normal breath sounds. No stridor.  Chest:     Chest wall: Mass present.     Comments: Right breast see picture below.  She has about a 5 x 7 cm area that is a nodular fungating mass.  In the midportion it is open ulcerated and draining.  The right breast also has palpable nodules within it in the lateral breast.  Left breast is  without nodules.  There were no axillary or supraclavicular lymph nodes found. Abdominal:     General: Bowel sounds are normal. There is no abdominal bruit.     Palpations: Abdomen is soft. There is no fluid wave, hepatomegaly, splenomegaly or mass.     Tenderness: There is no abdominal tenderness. There is no guarding or rebound.  Lymphadenopathy:     Cervical: No cervical adenopathy.  Skin:    General: Skin is warm and dry.     Capillary Refill: Capillary refill takes less than 2 seconds.     Comments: She has multiple senile keratotic nodules.  Neurological:     General: No focal deficit present.     Mental Status: She is alert and oriented to person, place, and time.     Cranial Nerves: No cranial nerve deficit.  Psychiatric:        Mood and Affect: Mood is anxious.        Behavior: Behavior normal. Behavior is not agitated.       Results for orders placed or performed during the hospital encounter of 10/14/18 (from the past 48 hour(s))  CBC with Differential     Status: Abnormal   Collection Time: 10/14/18  2:40 PM  Result Value  Ref Range   WBC 10.3 4.0 - 10.5 K/uL   RBC 4.30 3.87 - 5.11 MIL/uL   Hemoglobin 12.6 12.0 - 15.0 g/dL   HCT 40.5 36.0 - 46.0 %   MCV 94.2 80.0 - 100.0 fL   MCH 29.3 26.0 - 34.0 pg   MCHC 31.1 30.0 - 36.0 g/dL   RDW 14.9 11.5 - 15.5 %   Platelets 334 150 - 400 K/uL   nRBC 0.0 0.0 - 0.2 %   Neutrophils Relative % 73 %   Neutro Abs 7.7 1.7 - 7.7 K/uL   Lymphocytes Relative 16 %   Lymphs Abs 1.6 0.7 - 4.0 K/uL   Monocytes Relative 8 %   Monocytes Absolute 0.8 0.1 - 1.0 K/uL   Eosinophils Relative 1 %   Eosinophils Absolute 0.1 0.0 - 0.5 K/uL   Basophils Relative 1 %   Basophils Absolute 0.1 0.0 - 0.1 K/uL   Immature Granulocytes 1 %   Abs Immature Granulocytes 0.10 (H) 0.00 - 0.07 K/uL    Comment: Performed at [facility], [address] [place], [place] [address]  Comprehensive metabolic panel     Status: Abnormal   Collection Time: 10/14/18  2:40 PM  Result Value Ref Range   Sodium 140 135 - 145 mmol/L   Potassium 4.0 3.5 - 5.1 mmol/L   Chloride 100 98 - 111 mmol/L   CO2 27 22 - 32 mmol/L   Glucose, Bld 111 (H) 70 - 99 mg/dL   BUN 24 (H) 8 - 23 mg/dL   Creatinine, Ser 1.34 (H) 0.44 - 1.00 mg/dL   Calcium 9.3 8.9 - 10.3 mg/dL   Total Protein 6.6 6.5 - 8.1 g/dL   Albumin 3.3 (L) 3.5 - 5.0 g/dL   AST 20 15 - 41 U/L   ALT 16 0 - 44 U/L   Alkaline Phosphatase 97 38 - 126 U/L   Total Bilirubin 0.7 0.3 - 1.2 mg/dL   GFR calc non Af Amer 38 (L) >60 mL/min   GFR calc Af Amer 44 (L) >60 mL/min   Anion gap 13 5 - 15    Comment: Performed at [facility] [address], [place], [place] [address]  Brain  natriuretic peptide     Status: Abnormal   Collection Time: 10/14/18  2:40 PM  Result Value Ref Range   B Natriuretic Peptide 458.5 (H) 0.0 - 100.0 pg/mL    Comment: Performed at [facility] [address], [place], [place] [address]  Culture, blood (routine x 2)     Status: None (Preliminary result)   Collection Time: 10/14/18  2:41 PM   Specimen: BLOOD   Result Value Ref Range   Specimen Description BLOOD RIGHT ANTECUBITAL    Special Requests      BOTTLES DRAWN AEROBIC AND ANAEROBIC Blood Culture results may not be optimal due to an inadequate volume of blood received in culture bottles   Culture      NO GROWTH < 24 HOURS Performed at [facility] [address] [place], [place] [address]    Report Status PENDING   Culture, blood (routine x 2)     Status: None (Preliminary result)   Collection Time: 10/14/18  2:41 PM   Specimen: BLOOD  Result Value Ref Range   Specimen Description BLOOD LEFT ANTECUBITAL    Special Requests      BOTTLES DRAWN AEROBIC AND ANAEROBIC Blood Culture adequate volume   Culture      NO GROWTH < 24 HOURS Performed at [facility], [place] [address], [place], [place] [address]    Report Status PENDING   Lactic acid, plasma     Status: Abnormal   Collection Time: 10/14/18  3:14 PM  Result Value Ref Range   Lactic Acid, Venous 3.0 (HH) 0.5 - 1.9 mmol/L    Comment: CRITICAL RESULT CALLED TO, READ BACK BY AND VERIFIED WITH: L SHLUER,RN 1604 10/14/2018 WBOND Performed at [facility], [place] [address] [place],  [address]   SARS Coronavirus 2 [facility] order, Performed in [facility] hospital lab) Nasopharyngeal Nasopharyngeal Swab     Status: None   Collection Time: 10/14/18  3:26 PM   Specimen: Nasopharyngeal Swab  Result Value Ref Range   SARS Coronavirus 2 NEGATIVE NEGATIVE    Comment: (NOTE) If result is NEGATIVE SARS-CoV-2 target nucleic acids are NOT DETECTED. The SARS-CoV-2 RNA is generally detectable in upper and lower  respiratory specimens during the acute phase of infection. The lowest  concentration of SARS-CoV-2 viral copies this assay can detect is 250  copies / mL. A negative result does not preclude SARS-CoV-2 infection  and should not be used as the sole basis for treatment or other  patient management decisions.  A negative result may occur with  improper specimen  collection / handling, submission of specimen other  than nasopharyngeal swab, presence of viral mutation(s) within the  areas targeted by this assay, and inadequate number of viral copies  (<250 copies / mL). A negative result must be combined with clinical  observations, patient history, and epidemiological information. If result is POSITIVE SARS-CoV-2 target nucleic acids are DETECTED. The SARS-CoV-2 RNA is generally detectable in upper and lower  respiratory specimens dur ing the acute phase of infection.  Positive  results are indicative of active infection with SARS-CoV-2.  Clinical  correlation with patient history and other diagnostic information is  necessary to determine patient infection status.  Positive results do  not rule out bacterial infection or co-infection with other viruses. If result is PRESUMPTIVE POSTIVE SARS-CoV-2 nucleic acids MAY BE PRESENT.   A presumptive positive result was obtained on the submitted specimen  and confirmed on repeat testing.  While 2019 novel coronavirus  (SARS-CoV-2) nucleic acids may be present in the submitted sample  additional confirmatory testing may be necessary for epidemiological  and / or clinical management purposes  to differentiate between  SARS-CoV-2 and other Sarbecovirus currently known to infect humans.  If clinically indicated additional testing with an alternate test  methodology [phone]) is advised. The SARS-CoV-2 RNA is generally  detectable in upper and lower respiratory sp ecimens during the acute  phase of infection. The expected result is Negative. Fact Sheet for Patients:  StrictlyIdeas.no Fact Sheet for Healthcare Providers: BankingDealers.co.za This test is not yet approved or cleared by the [place] FDA and has been authorized for detection and/or diagnosis of SARS-CoV-2 by FDA under an Emergency Use Authorization (EUA).  This EUA will remain in effect  (meaning this test can be used) for the duration of the COVID-19 declaration under Section 564(b)(1) of the Act, 21 U.S.C. section 360bbb-3(b)(1), unless the authorization is terminated or revoked sooner. Performed at [facility], [place] [address] [place], [place] [address]   Lactic acid, plasma     Status: None   Collection Time: 10/14/18  6:00 PM  Result Value Ref Range   Lactic Acid, Venous 1.9 0.5 - 1.9 mmol/L    Comment: Performed at [facility] [address], [place], [place] [address]  Comprehensive metabolic panel     Status: Abnormal   Collection Time: 10/15/18  1:30 AM  Result Value Ref Range   Sodium 141 135 - 145 mmol/L   Potassium 3.4 (L) 3.5 - 5.1 mmol/L   Chloride 99 98 - 111 mmol/L   CO2 28 22 - 32 mmol/L   Glucose, Bld 121 (H) 70 - 99 mg/dL   BUN 23 8 - 23 mg/dL   Creatinine, Ser 1.13 (H) 0.44 - 1.00 mg/dL   Calcium 9.0 8.9 - 10.3 mg/dL   Total Protein 6.4 (L) 6.5 - 8.1 g/dL   Albumin 3.0 (L) 3.5 - 5.0 g/dL   AST 19 15 - 41 U/L   ALT 16 0 - 44 U/L   Alkaline Phosphatase 96 38 - 126 U/L   Total Bilirubin 0.7 0.3 - 1.2 mg/dL   GFR calc non Af Amer 46 (L) >60 mL/min   GFR calc Af Amer 54 (L) >60 mL/min   Anion gap 14 5 - 15    Comment: Performed at [facility], [place] [address], [place], [place] [address]  CBC     Status: Abnormal   Collection Time: 10/15/18  1:30 AM  Result Value Ref Range   WBC 12.0 (H) 4.0 - 10.5 K/uL   RBC 4.07 3.87 - 5.11 MIL/uL   Hemoglobin 11.8 (L) 12.0 - 15.0 g/dL   HCT 37.5 36.0 - 46.0 %   MCV 92.1 80.0 - 100.0 fL   MCH 29.0 26.0 - 34.0 pg   MCHC 31.5 30.0 - 36.0 g/dL   RDW 14.7 11.5 - 15.5 %   Platelets 328 150 - 400 K/uL   nRBC 0.0 0.0 - 0.2 %    Comment: Performed at [facility], [place] [address] [place], [place] [address]  Protime-INR     Status: None   Collection Time: 10/15/18  1:30 AM  Result Value Ref Range   Prothrombin Time 14.2 11.4 - 15.2 seconds   INR 1.1 0.8 - 1.2    Comment:  (NOTE) INR goal varies based on device and disease states. Performed at [facility]  Lab, [address] [place], [place] [address]   APTT     Status: None   Collection Time: 10/15/18  1:30 AM  Result Value Ref Range   aPTT 31 24 - 36 seconds    Comment: Performed at [facility] [address], [place], [place] [address]  Magnesium     Status: Abnormal   Collection Time: 10/15/18  1:30 AM  Result Value Ref Range   Magnesium 1.6 (L) 1.7 - 2.4 mg/dL    Comment: Performed at [facility] [address] [place], [place] [address]   Ct Chest Wo Contrast  Result Date: 10/14/2018 CLINICAL DATA:  78 year old female with past medical history of hypertension who presents with a 3-week history of progressive worsening of shortness of breath. Patient reports that symptoms started with a runny nose and cough productive of clear white mucus. Patient saw outpatient provider, was COVID negative, was started on allergy medication. Shortness of breath continue to progress, saw provider on 9/12, was diagnosed with pneumonia, and started on Keflex. At that visit, patient was also started on doxycycline for cellulitis (under right breast). EXAM: CT CHEST WITHOUT CONTRAST TECHNIQUE: Multidetector CT imaging of the chest was performed following the standard protocol without IV contrast. COMPARISON:  Current chest radiograph. FINDINGS: Cardiovascular: Mild enlargement of the heart. No pericardial effusion. Three-vessel coronary artery calcifications. Great vessels normal in caliber. There are aortic atherosclerotic calcifications. Mediastinum/Nodes: No neck base masses or enlarged lymph nodes. No mediastinal or hilar masses or adenopathy. Trachea and esophagus are unremarkable. Lungs/Pleura: Moderate-sized left and small right pleural effusions. Dependent atelectasis in the lower lobes, left greater than right. Mild interstitial thickening most evident the lower lobes. Linear opacities are noted in the  left upper lobe posteriorly and in the lingula consistent with atelectasis. There are a few small irregular nodules, largest subpleural, right lower lobe, image 81, series 4, 6 mm. Mild scarring at the apices. No pneumothorax. Upper Abdomen: No acute findings. Status post cholecystectomy. Aortic atherosclerosis. Musculoskeletal: No fracture or acute finding. No osteoblastic or osteolytic lesions. Chest wall: There is masslike soft tissue that is contiguous with the skin medial aspect of the right breast, measuring approximately 5.7 x 3.5 x 4.9 cm. There is more generalized breast skin thickening bilaterally. Mildly prominent bilateral axillary lymph nodes are noted, none pathologically enlarged by size criteria. IMPRESSION: 1. No convincing pneumonia. 2. Moderate left and small right pleural effusions. There is mild bilateral interstitial thickening most evident in the lower lungs. This suggests mild interstitial edema. 3. Dependent atelectasis in the lower lobes, left greater than right, with milder atelectasis in the left upper lobe. 4. Small lung nodules most evident in the right middle lobe. 5. Abnormal soft tissue contiguous with the skin of the medial right breast. Although this may be infectious/inflammatory, neoplastic disease should be considered. Consider follow-up diagnostic mammography and possible right breast ultrasound for further assessment. Aortic Atherosclerosis (ICD10-I70.0). Electronically Signed   By: Lajean Manes M.D.   On: 10/14/2018 20:05   Dg Chest Port 1 View  Result Date: 10/14/2018 CLINICAL DATA:  Shortness of breath EXAM: PORTABLE CHEST 1 VIEW COMPARISON:  Chest radiograph dated 10/05/2018. FINDINGS: The heart appears enlarged accounting for technique. There is a small a moderate left pleural effusion with associated atelectasis/airspace disease. There is mild right basilar atelectasis/airspace disease. There is no pneumothorax. The visualized skeletal structures are unremarkable.  IMPRESSION: Small to moderate left pleural effusion with associated atelectasis/airspace disease. Mild right basilar atelectasis/airspace  disease. Cardiomegaly. Electronically Signed   By: Zerita Boers M.D.   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  FEN: Heart healthy diet ID: None. DVT: Lovenox Follow-up: To be determined  Plan.  Patient is going for thoracentesis later this a.m.  I will review with Dr. Lucia Gaskins and discuss further imaging/diagnostic requirements.  When this completed will discuss treatment options.  Earnstine [facility] Surgery 10/15/2018, 12:00 PM Pager: [phone] Consults: [phone]  Agree with above.  We can try to get tissue while she is in the hospital.  Will try beside skin biopsy tomorrow.  Will need pathology and, if breast cancer, prognostic profile.  She will need Medical and Radiation oncology consultation.  And even though not amendable to cure, breast cancer is often very treatable, depending on the pathology, as long as the patient participates.  Alphonsa Overall, MD, [facility] Surgery Pager: [phone] Office phone:  [phone]

## 2018-10-15 NOTE — Procedures (Addendum)
Thoracentesis Procedure Note  Pre-operative Diagnosis:  Pleural effusion  Indications:  Symptomatic pleural effusion  Procedure Details:  Informed consent was obtained after explanation of the risks and benefits of the procedure, refer to the consent documentation.  Time-out Performed immediately prior to the procedure.  All available chest radiographs were reviewed and the patient was subsequently placed in a sitting/semi-recumbent position.  Using ultrasound guidance a moderate pleural effusion was noted on the left.  The area was prepped with chlorhexadine and draped in sterile fashion.  Following this 1% lidocaine was injected subcutaneously and deep to provide anesthesia.  A small incision was then made parallel and superior to the rib, the thoracentesis needle with catheter was inserted into the chest wall and advanced under constant aspiration.  Upon aspiration of pleural fluid, the catheter was advanced into the pleural space and the needle was removed.  The catheter was then connected to a drainange bag and the fluid was removed under manual drainage. The catheter was then removed during slow forced exhalation and a sterile bandage was placed.  Findings:   944mL of serosanguinous pleural fluid was removed.  Fluid was sent for LDH, protein, culture, cell count, cytology  Condition: The patient tolerated the procedure well and remains in the same condition as pre-procedure.  Complications: None  Plan: Post-procedure ultrasound showed appropriate lung sliding, which makes a pneumothorax unlikely. Patient experienced coughing following procedure which is expected as the lungs expand. A routine post-procedure xray was ordered. We will followup results.  Jeanmarie Hubert, MD 10/15/2018, 3:15 PM Pager: [phone]

## 2018-10-15 NOTE — Progress Notes (Addendum)
Dr. Lalla Brothers and Dr. Quintin Alto, resident, at bedside to perform thoracentesis. Timeout performed, see flow sheet, pt monitored with frequent VS, all within normal limits. Renda Rolls, IT sales professional, Leatrice Jewels, RN, and Janine Ores, RN from [employer] at bedside. Procedure start time 1424 and end time [id number], about 1000cc yellow fluid pulled off from left lung, pt with coughing post procedure, MD stated it was normal, did order CXR for follow up. 1455 pt sitting up in bed, coughing has stopped, NAD, VSS, denies pain.

## 2018-10-16 ENCOUNTER — Inpatient Hospital Stay (HOSPITAL_COMMUNITY): Payer: Medicare HMO

## 2018-10-16 ENCOUNTER — Encounter (HOSPITAL_COMMUNITY): Payer: Self-pay | Admitting: General Practice

## 2018-10-16 DIAGNOSIS — J939 Pneumothorax, unspecified: Secondary | ICD-10-CM

## 2018-10-16 LAB — BLOOD CULTURE ID PANEL (REFLEXED)

## 2018-10-16 LAB — CBC
HCT: 38.3 % (ref 36.0–46.0)
Hemoglobin: 11.9 g/dL — ABNORMAL LOW (ref 12.0–15.0)
MCH: 28.7 pg (ref 26.0–34.0)
MCHC: 31.1 g/dL (ref 30.0–36.0)
MCV: 92.3 fL (ref 80.0–100.0)
Platelets: 312 10*3/uL (ref 150–400)
RBC: 4.15 MIL/uL (ref 3.87–5.11)
RDW: 14.8 % (ref 11.5–15.5)
WBC: 12.9 10*3/uL — ABNORMAL HIGH (ref 4.0–10.5)
nRBC: 0 % (ref 0.0–0.2)

## 2018-10-16 LAB — BASIC METABOLIC PANEL
Anion gap: 13 (ref 5–15)
BUN: 27 mg/dL — ABNORMAL HIGH (ref 8–23)
CO2: 27 mmol/L (ref 22–32)
Calcium: 8.6 mg/dL — ABNORMAL LOW (ref 8.9–10.3)
Chloride: 98 mmol/L (ref 98–111)
Creatinine, Ser: 1.24 mg/dL — ABNORMAL HIGH (ref 0.44–1.00)
GFR calc Af Amer: 48 mL/min — ABNORMAL LOW (ref >60)
GFR calc non Af Amer: 41 mL/min — ABNORMAL LOW (ref >60)
Glucose, Bld: 129 mg/dL — ABNORMAL HIGH (ref 70–99)
Potassium: 3.9 mmol/L (ref 3.5–5.1)
Sodium: 138 mmol/L (ref 135–145)

## 2018-10-16 LAB — GLUCOSE, CAPILLARY: Glucose-Capillary: 147 mg/dL — ABNORMAL HIGH (ref 70–99)

## 2018-10-16 LAB — CYTOLOGY - NON PAP

## 2018-10-16 MED ORDER — METOPROLOL TARTRATE 5 MG/5ML IV SOLN
5.0000 mg | INTRAVENOUS | Status: AC | PRN
  Administered 2018-10-16 (×2): 5 mg via INTRAVENOUS
  Filled 2018-10-16: qty 5

## 2018-10-16 MED ORDER — ONDANSETRON HCL 4 MG/2ML IJ SOLN
4.0000 mg | Freq: Four times a day (QID) | INTRAMUSCULAR | Status: DC | PRN
  Administered 2018-10-16: 15:00:00 4 mg via INTRAVENOUS
  Filled 2018-10-16: qty 2

## 2018-10-16 MED ORDER — SODIUM CHLORIDE 0.9 % IV SOLN
3.0000 g | Freq: Three times a day (TID) | INTRAVENOUS | Status: DC
  Administered 2018-10-17: 3 g via INTRAVENOUS
  Filled 2018-10-16: qty 8
  Filled 2018-10-16: qty 3
  Filled 2018-10-16 (×3): qty 8

## 2018-10-16 MED ORDER — DILTIAZEM HCL-DEXTROSE 100-5 MG/100ML-% IV SOLN (PREMIX)
5.0000 mg/h | INTRAVENOUS | Status: DC
  Administered 2018-10-16: 5 mg/h via INTRAVENOUS
  Filled 2018-10-16: qty 100

## 2018-10-16 MED ORDER — SODIUM CHLORIDE 0.9 % IV BOLUS
1000.0000 mL | Freq: Once | INTRAVENOUS | Status: AC
  Administered 2018-10-16: 1000 mL via INTRAVENOUS

## 2018-10-16 MED ORDER — VANCOMYCIN HCL IN DEXTROSE 1-5 GM/200ML-% IV SOLN: 1000.0000 mg | INTRAVENOUS | Status: DC

## 2018-10-16 MED ORDER — ENOXAPARIN SODIUM 150 MG/ML ~~LOC~~ SOLN
130.0000 mg | SUBCUTANEOUS | Status: DC
  Administered 2018-10-16: 130 mg via SUBCUTANEOUS
  Filled 2018-10-16: qty 0.87

## 2018-10-16 MED ORDER — PROMETHAZINE HCL 25 MG/ML IJ SOLN
12.5000 mg | Freq: Four times a day (QID) | INTRAMUSCULAR | Status: DC | PRN
  Administered 2018-10-16: 12.5 mg via INTRAVENOUS
  Filled 2018-10-16: qty 1

## 2018-10-16 MED ORDER — VANCOMYCIN HCL 10 G IV SOLR
1500.0000 mg | Freq: Once | INTRAVENOUS | Status: AC
  Administered 2018-10-16: 21:00:00 1500 mg via INTRAVENOUS
  Filled 2018-10-16: qty 1500

## 2018-10-16 MED ORDER — IOHEXOL 350 MG/ML SOLN
80.0000 mL | Freq: Once | INTRAVENOUS | Status: AC | PRN
  Administered 2018-10-16: 80 mL via INTRAVENOUS

## 2018-10-16 NOTE — Significant Event (Signed)
Rapid Response Event Note  Overview: Respiratory   RN called, patient in respiratory distress and having new N/V.     Initial Focused Assessment: 1710: On arrival, patient sitting up in bed, vomiting small amount of clear emesis. Patient moaning but no complaints of pain, just nausea. Lungs diminished, left lower markedly diminished. Tachycardic, appears ST 110's.  Tachypnic 20-30s. BP 140/70.    Noted she had a thoracentesis 9/15 and a breast biopsy today 9/16.    Concern for PE vs aspiration pna vs infectious process per IMTS.   Interventions: -STAT CXR, KUB done -Phenergan IVP -STAT Chest CT done -Transfer to PCU  Plan of Care (if not transferred):  CT chest done, transferred to 4E22  1830: On transfer to new room, patient went into AF RVR rate 150-170.  EKG done. RN paged MD, orders for Metoprolol and Diltiazem.   Will continue IVF at 295ml/hr for total 1L NS.   Event Summary: End Call: [id number]    Carl Best

## 2018-10-16 NOTE — Progress Notes (Signed)
Patient was seen at the bedside after the nurse paged me saying patient was in A. fib with RVR.  Heart rate was up to 170s and patient was symptomatic.  We ordered IV metoprolol 5 mg and started a diltiazem drip. We saw the patient at the bedside, she had received the metoprolol and her heart rate decreased down to the 120s with improvement of her symptoms. EKG was ordered and pending, however on telemetry the patient was no longer in RVR.   Of note, pharmacy also paged me to let us know that the blood cultures had grown gram-positive cocci in both aerobic and anaerobic bottles. The patient is currently on Unasyn but will add vancomycin to broaden coverage.  Earlene Plater, MD Internal Medicine, PGY1 Pager: [phone]  10/16/2018,6:53 PM

## 2018-10-16 NOTE — Progress Notes (Signed)
  Subjective:  Patient reports she is doing okay this morning but is little stressed from all the information.  Patient reports that her shortness of breath has gotten better.  Upon patient's request, spoke with son Beda Rabuck on phone at [phone] and updated him on patient's plan of care.  Objective:   Vital Signs (last 24 hours): Vitals:   10/15/18 1636 10/15/18 1700 10/15/18 2011 10/16/18 0513  BP: 135/61 137/65 (!) 135/47 137/67  Pulse: 83 88 91 76  Resp: (!) 28 (!) 26 (!) 22   Temp:   98.7 F (37.1 C) 98.3 F (36.8 C)  TempSrc:   Oral Oral  SpO2: 98% 100% 94% 97%  Weight:    87.2 kg  Height:       Physical Exam: General Alert and answers questions appropriately, no acute distress  Cardiac Regular rate and rhythm, no murmurs, rubs, or gallops  Pulmonary Clear to auscultation bilaterally without wheezes, rhonchi, or rales  Extremities Trace peripheral edema    Assessment/Plan:   Principal Problem:   Acute CHF (congestive heart failure) (HCC) Active Problems:   Mass of breast, right   CKD (chronic kidney disease) stage 3, GFR 30-59 ml/min (HCC)   Pleural effusion, left  Patient is a 78 year old female with past medical history of hypertension, anxiety who presented with a 3-week history of progressive worsening shortness of breath who was found to have an exudative pleural effusion as well as a right breast fungating skin lesion concerning for malignancy.  # Shortness of breath with exudative effusion: Shortness of breath is likely secondary to malignancy.  CT of chest showed interstitial edema, left greater than right pleural effusions, but no evidence for pneumonia.  Approximately 900 mL pleural fluid was drained from left side with findings on fluid analysis consistent with exudative effusion with lymphocyte predominant white cells.  No cardiac origin of patient's volume status detected - TTE yesterday showed EF.  65%, left ventricular diastolic Doppler parameters  consistent with impaired relaxation. * Cytology pending * Patient diuresed with Lasix 40 mg the past 2 days for lower extremity edema.  Potassium stable, creatinine of 1.24 from 1.13 yesterday (still below admission baseline).  Lower extremity edema is minimal, will discontinue Lasix today.  # Pneumothorax s/p thoracentesis: Initial chest x-ray yesterday following thoracentesis demonstrated small left apical and base pneumothorax.  Patient was treated with nitrogen washout, nonrebreathing facemask with 20% FiO2 for 2 hours.  On repeat film, the pneumothoraces resolved.  # Fungating right breast mass: Nausea, nontender, fungating mass beneath the right breast, aggressive/worsening over the past several months.  Axillary lymphadenopathy seen on CT scans and small pulmonary nodules. * General surgery consulted and plans for bedside biopsy today.   Diet: Regular diet DVT Ppx: Lovenox 40 mg QD Dispo: Anticipated discharge pending clinical improvement  Jeanmarie Hubert, MD 10/16/2018, 10:37 AM Pager: [phone]

## 2018-10-16 NOTE — Progress Notes (Addendum)
CC:  SOB  Subjective: 900 ml left thoracentesis yesterday TS seeing her earlier.  I collected supplies to do biopsy and she was eating lunch and said she was SOB for over an hour.  O2 sats good on [place] O2, but dropped down into high 70's to mid 80's with N&V.  I put O2 back on and Sat has come up.  Getting CXR.     She had BS on both sides when she first complained to me.    Objective: Vital signs in last 24 hours: Temp:  [98.3 F (36.8 C)-99.6 F (37.6 C)] 98.3 F (36.8 C) (09/16 0513) Pulse Rate:  [76-97] 76 (09/16 0513) Resp:  [22-29] 22 (09/15 2011) BP: (119-151)/(47-115) 137/67 (09/16 0513) SpO2:  [92 %-100 %] 97 % (09/16 0513) Weight:  [87.2 kg] 87.2 kg (09/16 0513)  480 PO Urine 1150 Afebrile, VSS Creatinine is up to 1.24 WBC stable at 12.9 Thoracentesis cytology pending   Intake/Output from previous day: 09/15 0701 - 09/16 0700 In: 586 [P.O.:480; IV Piggyback:106] Out: 1150 [Urine:1150] Intake/Output this shift: No intake/output data recorded.  General appearance: alert, cooperative and she was SOB originally, now she is better Resp: clear to auscultation bilaterally Skin: Skin color, texture, turgor normal. No rashes or lesions or breast mass about the same.    Lab Results:  Recent Labs    10/15/18 0130 10/16/18 0155  WBC 12.0* 12.9*  HGB 11.8* 11.9*  HCT 37.5 38.3  PLT 328 312    BMET Recent Labs    10/15/18 0130 10/16/18 0155  NA 141 138  K 3.4* 3.9  CL 99 98  CO2 28 27  GLUCOSE 121* 129*  BUN 23 27*  CREATININE 1.13* 1.24*  CALCIUM 9.0 8.6*   PT/INR Recent Labs    10/15/18 0130  LABPROT 14.2  INR 1.1    Recent Labs  Lab 10/14/18 1440 10/15/18 0130  AST 20 19  ALT 16 16  ALKPHOS 97 96  BILITOT 0.7 0.7  PROT 6.6 6.2*  6.4*  ALBUMIN 3.3* 3.0*     Lipase  No results found for: LIPASE   Medications: . enoxaparin (LOVENOX) injection  40 mg Subcutaneous Q24H  . 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  FEN: Heart healthy diet ID: None. DVT: Lovenox Follow-up: To be determined  Procedure:  Breast tissue biopsy  Pt was placed in a supine position in in bed.  The right breast was exposed.  Dressing was taken down.  Area was cleaned with Betadine swabs x3.  9 cc of 2% lidocaine with epinephrine was infiltrated in the area of the biopsy.  An area of the tumor as well as adjacent normal skin was picked right medial breast side.  After local anesthesia was established.  A 1.5 x 1 x 0.5 cm piece of tissue was excised using a #11 blade.  After adequate hemostasis with direct pressure a single 4-0 suture was used to close the site.  A dry sterile dressing was placed over the site.  The tissue was placed in formalin and labeled.  Patient tolerated the procedure well without any issues.  Tissue sample was taken to the lab.      LOS: 2 days  JENNINGS,WILLARD 10/16/2018 [phone]  Agree with above. Will Creig Hines did right breast biopsy today - this should give [place] pathology and tumor markers.  Alphonsa Overall, MD, [facility] Pager: [phone] Office phone:  [phone]

## 2018-10-16 NOTE — Progress Notes (Signed)
Patient found shivering and nauseated. O2 sats 60% on  2L O2. BP 127/89 pulse 118, resp 20, temp 98.1. Resident paged.and Rapid response called. Orders received. Transferred patient to [facility] room 22 with Rapid Response nurse Lattie Haw. Marilynn Rail, granddaughter notified of patient's condition and new room.

## 2018-10-16 NOTE — Progress Notes (Signed)
Patient arrived at 6:45 via RR and 6N nurses who had taken her to CT.   A&Ox3.  Afib w/RVR up to 170's.  Pt denies any symptoms.  SOB w O2 @ 4L  MD notified of IR HR .Orders received and MD at bedside.

## 2018-10-16 NOTE — Progress Notes (Signed)
Paged by surgery for desaturations. Patient O2 removed and patient ambulating when she became short of breath, started coughing, and then had nausea, emesis. During nausea/vomiting patient had desaturation to 70s. CXR ordered. Upon evaluation, patient breathing comfortably on 2 L [place]. Lungs clear to auscultation bilaterally. Will followup CXR read  Jeanmarie Hubert, MD 10/16/2018, 3:12 PM Pager: [phone]

## 2018-10-16 NOTE — Progress Notes (Signed)
Team was notified that patient had another episode of NBNB emesis and desaturated to 60% afterwards.   Went to evaluate patient at bedside. Patient says "help me, I feel bad." She asks what is wrong with her. She denies chest pain, shortness of breath, and abdominal pain, but is very nauseous and reports episodes of shaking. On exam, she is tachycardic with HR 120s, BP 127/89, RR 20s, and O2 sat is 94% on 2L of supplemental O2. She appears in mild distress 2/2 nausea.    CV: Tachycardic without mrg, no JVD Pulm: exam unchanged from 1-2 hours ago, decreased breath sounds at R base but otherwise clear, no wheezing or crackles. Appears tachypnic.  Abd: soft, NTND, normoactive bowel sounds   A: 78 yo female with no medical history who presented with volume overload and hypoxemia who was found to have a fungating mass below R breast and bilateral pleural effusions L>R highly suspicious for malignancy. She underwent thoracentesis yesterday. 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 At this time, I am concerned for PE in the setting of known malignancy and new tachycardia, re-expansion pulmonary edema given recent thoracentesis, and aspiration PNA with new RLL infiltrate after emesis episode. PTX also possible, though CXR from 2 hours ago was negative for this. It is unclear to me what is causing her nausea at this time.   P:  - CT chest w/ contrast to evaluate for PE. Ordered 1L NS bolus.  - Unasyn to cover for aspiration  PNA given episodes of emesis with new RLL infiltrate  - CXR to evaluate for re-expansion pulmonary edema given recent thoracentesis  - KUB for further evaluation of nausea, ?ileus, ?obstruction  - EKG to ensure this is sinus tachycardia vs an arrhythmia  - Start phenergan 12.5 mg q6h PRN in addition to Zofran  - Transfer to progressive  - Will continue to monitor closely

## 2018-10-16 NOTE — Progress Notes (Signed)
Pharmacy Antibiotic Note  Beverly Tucker is a 78 y.o. female admitted on 10/14/2018 with SOB and now with concern for aspiration PNA.  Pharmacy has been consulted for Unasyn dosing.  WBC 12.9, AF, SCr 1.24 CrCl ~ 40 ml/min.    Plan: Unasyn 3g IV every 8 hours Monitor renal function, Cx and LOT  Height: 5\' 6"  (167.6 cm) Weight: 192 lb 3.9 oz (87.2 kg) IBW/kg (Calculated) : 59.3  Temp (24hrs), Avg:98.6 F (37 C), Min:98.1 F (36.7 C), Max:99.3 F (37.4 C)  Recent Labs  Lab 10/14/18 1440 10/14/18 1514 10/14/18 1800 10/15/18 0130 10/16/18 0155  WBC 10.3  --   --  12.0* 12.9*  CREATININE 1.34*  --   --  1.13* 1.24*  LATICACIDVEN  --  3.0* 1.9  --   --     Estimated Creatinine Clearance: 40.9 mL/min (A) (by C-G formula based on SCr of 1.24 mg/dL (H)).    No Known Allergies  Bertis Ruddy, PharmD Clinical Pharmacist Please check AMION for all [place] numbers 10/16/2018 5:39 PM

## 2018-10-16 NOTE — Progress Notes (Signed)
Pharmacy Antibiotic Note  Beverly Tucker is a 78 y.o. female admitted on 10/14/2018 with pneumonia/?bacteremia  Pharmacy has been consulted for Vancomycin  dosing.  Plan: Vancomycin 1500 mg iv x 1  Vancomycin 1000 mg iv Q 24 hours  Follow up blood cultures  Height: 5\' 6"  (167.6 cm) Weight: 192 lb 3.9 oz (87.2 kg) IBW/kg (Calculated) : 59.3  Temp (24hrs), Avg:98.6 F (37 C), Min:98.1 F (36.7 C), Max:99.3 F (37.4 C)  Recent Labs  Lab 10/14/18 1440 10/14/18 1514 10/14/18 1800 10/15/18 0130 10/16/18 0155  WBC 10.3  --   --  12.0* 12.9*  CREATININE 1.34*  --   --  1.13* 1.24*  LATICACIDVEN  --  3.0* 1.9  --   --     Estimated Creatinine Clearance: 40.9 mL/min (A) (by C-G formula based on SCr of 1.24 mg/dL (H)).    No Known Allergies  Thank you for allowing pharmacy to be a part of this patient's care. Anette Guarneri, PharmD 10/16/2018 7:33 PM

## 2018-10-17 DIAGNOSIS — J939 Pneumothorax, unspecified: Secondary | ICD-10-CM

## 2018-10-17 DIAGNOSIS — R7881 Bacteremia: Secondary | ICD-10-CM

## 2018-10-17 DIAGNOSIS — I4891 Unspecified atrial fibrillation: Secondary | ICD-10-CM

## 2018-10-17 DIAGNOSIS — R0902 Hypoxemia: Secondary | ICD-10-CM

## 2018-10-17 LAB — BASIC METABOLIC PANEL
Anion gap: 9 (ref 5–15)
BUN: 21 mg/dL (ref 8–23)
CO2: 26 mmol/L (ref 22–32)
Calcium: 7.9 mg/dL — ABNORMAL LOW (ref 8.9–10.3)
Chloride: 102 mmol/L (ref 98–111)
Creatinine, Ser: 1.1 mg/dL — ABNORMAL HIGH (ref 0.44–1.00)
GFR calc Af Amer: 55 mL/min — ABNORMAL LOW (ref >60)
GFR calc non Af Amer: 48 mL/min — ABNORMAL LOW (ref >60)
Glucose, Bld: 121 mg/dL — ABNORMAL HIGH (ref 70–99)
Potassium: 4.2 mmol/L (ref 3.5–5.1)
Sodium: 137 mmol/L (ref 135–145)

## 2018-10-17 LAB — CBC
HCT: 38.9 % (ref 36.0–46.0)
Hemoglobin: 11.6 g/dL — ABNORMAL LOW (ref 12.0–15.0)
MCH: 28.9 pg (ref 26.0–34.0)
MCHC: 29.8 g/dL — ABNORMAL LOW (ref 30.0–36.0)
MCV: 96.8 fL (ref 80.0–100.0)
Platelets: 238 10*3/uL (ref 150–400)
RBC: 4.02 MIL/uL (ref 3.87–5.11)
RDW: 14.7 % (ref 11.5–15.5)
WBC: 20.2 10*3/uL — ABNORMAL HIGH (ref 4.0–10.5)
nRBC: 0 % (ref 0.0–0.2)

## 2018-10-17 LAB — GLUCOSE, CAPILLARY
Glucose-Capillary: 101 mg/dL — ABNORMAL HIGH (ref 70–99)
Glucose-Capillary: 131 mg/dL — ABNORMAL HIGH (ref 70–99)

## 2018-10-17 MED ORDER — VANCOMYCIN HCL IN DEXTROSE 750-5 MG/150ML-% IV SOLN
750.0000 mg | INTRAVENOUS | Status: DC
  Administered 2018-10-17: 750 mg via INTRAVENOUS
  Filled 2018-10-17: qty 150

## 2018-10-17 MED ORDER — ENOXAPARIN SODIUM 100 MG/ML ~~LOC~~ SOLN
90.0000 mg | Freq: Two times a day (BID) | SUBCUTANEOUS | Status: DC
  Administered 2018-10-17 – 2018-10-19 (×5): 90 mg via SUBCUTANEOUS
  Filled 2018-10-17 (×5): qty 1

## 2018-10-17 MED ORDER — METOPROLOL TARTRATE 12.5 MG HALF TABLET
12.5000 mg | ORAL_TABLET | Freq: Two times a day (BID) | ORAL | Status: DC
  Administered 2018-10-17 – 2018-10-18 (×3): 12.5 mg via ORAL
  Filled 2018-10-17 (×3): qty 1

## 2018-10-17 NOTE — Care Management Important Message (Signed)
Important Message  Patient Details  Name: Beverly Tucker MRN: [id number] Date of Birth: [birth year]   Medicare Important Message Given:  Yes     Shelda Altes 10/17/2018, 2:03 PM

## 2018-10-17 NOTE — Progress Notes (Signed)
ANTICOAGULATION CONSULT NOTE - Initial Consult  Pharmacy Consult for Lovenox Indication: atrial fibrillation  No Known Allergies  Patient Measurements: Height: 5\' 6"  (167.6 cm) Weight: 194 lb 10.7 oz (88.3 kg) IBW/kg (Calculated) : 59.3  Vital Signs: Temp: 98.2 F (36.8 C) (09/17 0400) Temp Source: Oral (09/17 0400) BP: 146/63 (09/17 1000) Pulse Rate: 80 (09/17 1000)  Labs: Recent Labs    10/15/18 0130 10/16/18 0155 10/17/18 0339  HGB 11.8* 11.9* 11.6*  HCT 37.5 38.3 38.9  PLT 328 312 238  APTT 31  --   --   LABPROT 14.2  --   --   INR 1.1  --   --   CREATININE 1.13* 1.24* 1.10*    Estimated Creatinine Clearance: 46.4 mL/min (A) (by C-G formula based on SCr of 1.1 mg/dL (H)).   Medical History: Past Medical History:  Diagnosis Date  . Acute CHF (congestive heart failure) ([facility]) 10/2018  . GERD (gastroesophageal reflux disease)   . Hypertension    Assessment:  Anticoag: Afib - Lovenox. Hgb 11.6 stable. Plts 328>238 watch closely. Scr 1.1 with CrCl 46.  Goal of Therapy:  Anti-Xa level 0.6-1 units/ml 4hrs after LMWH dose given Monitor platelets by anticoagulation protocol: Yes   Plan:  Change Lovenox to 90mg  BID. CBC q72h  Alixander Rallis S. Alford Highland, PharmD, BCPS Clinical Staff Pharmacist Eilene Ghazi [place] 10/17/2018,11:23 AM

## 2018-10-17 NOTE — Progress Notes (Addendum)
Subjective: CC: Right breast pain Patient was noted to have gone into a fib with rvr last night. She is now converted after being placed on dilt drip. She was noted to have + BC. Thoracentesis revealed malignant cell c/w adenocarcinoma. I made her aware of this. She reports that her right breast is sore from the procedure. She denies sob. No other complaints.   Objective: Vital signs in last 24 hours: Temp:  [98.1 F (36.7 C)-100.1 F (37.8 C)] 98.2 F (36.8 C) (09/17 0400) Pulse Rate:  [64-160] 80 (09/17 0948) Resp:  [18-36] 22 (09/17 0900) BP: (94-160)/(47-94) 125/51 (09/17 0948) SpO2:  [93 %-99 %] 98 % (09/17 0900) Weight:  [88.3 kg] 88.3 kg (09/17 0423)    Intake/Output from previous day: 09/16 0701 - 09/17 0700 In: 141 [I.V.:40.9; IV Piggyback:100.1] Out: 400 [Urine:400] Intake/Output this shift: No intake/output data recorded.  PE: Gen:  Alert, NAD, pleasant Breast: Chaperone present. Right breast with 5x7cm nodular fungating mass. The midportion along her breast fold is ulcerated and draining green draining. Stitch in place from recent bx Card:  RRR. 80's on monitor and NSR while I was in the room.  Pulm:  Expiratory wheezes throughout the right. Decreased lung sounds on the left.  Abd: Soft, NT/ND, +BS Ext:  No LE edema  Lab Results:  Recent Labs    10/16/18 0155 10/17/18 0339  WBC 12.9* 20.2*  HGB 11.9* 11.6*  HCT 38.3 38.9  PLT 312 238   BMET Recent Labs    10/16/18 0155 10/17/18 0339  NA 138 137  K 3.9 4.2  CL 98 102  CO2 27 26  GLUCOSE 129* 121*  BUN 27* 21  CREATININE 1.24* 1.10*  CALCIUM 8.6* 7.9*   PT/INR Recent Labs    10/15/18 0130  LABPROT 14.2  INR 1.1   CMP     Component Value Date/Time   NA 137 10/17/2018 0339   K 4.2 10/17/2018 0339   CL 102 10/17/2018 0339   CO2 26 10/17/2018 0339   GLUCOSE 121 (H) 10/17/2018 0339   BUN 21 10/17/2018 0339   CREATININE 1.10 (H) 10/17/2018 0339   CALCIUM 7.9 (L) 10/17/2018 0339   PROT 6.4 (L) 10/15/2018 0130   PROT 6.2 (L) 10/15/2018 0130   ALBUMIN 3.0 (L) 10/15/2018 0130   AST 19 10/15/2018 0130   ALT 16 10/15/2018 0130   ALKPHOS 96 10/15/2018 0130   BILITOT 0.7 10/15/2018 0130   GFRNONAA 48 (L) 10/17/2018 0339   GFRAA 55 (L) 10/17/2018 0339   Lipase  No results found for: LIPASE     Studies/Results: Dg Abd 1 View  Result Date: 10/16/2018 CLINICAL DATA:  Increased SOB as well as nausea and emesis. Hx of CHF, HTN, GERD. EXAM: ABDOMEN - 1 VIEW COMPARISON:  None. FINDINGS: The bowel gas pattern is normal. No radio-opaque calculi or other significant radiographic abnormality are seen. IMPRESSION: Negative. Electronically Signed   By: Nolon Nations M.D.   On: 10/16/2018 18:36   Ct Angio Chest Pe W Or Wo Contrast  Result Date: 10/16/2018 CLINICAL DATA:  Urgent patient brought down for PE due to SOB RN stated she had thoracentesis and breast bx and is now SOB and in a EXAM: CT ANGIOGRAPHY CHEST WITH CONTRAST TECHNIQUE: Multidetector CT imaging of the chest was performed using the standard protocol during bolus administration of intravenous contrast. Multiplanar CT image reconstructions and MIPs were obtained to evaluate the vascular anatomy. CONTRAST:  91mL OMNIPAQUE IOHEXOL  350 MG/ML SOLN COMPARISON:  Chest x-ray on 10/16/2018 FINDINGS: Cardiovascular: Heart size is mildly enlarged. There is atherosclerotic calcification of the coronary arteries. Calcification of the mitral annulus. Trace pericardial effusion. The pulmonary arteries are well opacified by contrast bolus and there is no evidence for acute pulmonary embolus. Note is made of atherosclerotic calcification of the thoracic aorta not associated with aneurysm. Mediastinum/Nodes: The visualized portion of the thyroid gland has a normal appearance. Esophagus is normal/in appearance. There are 3 level I RIGHT axillary lymph nodes with abnormal morphology, there small axillary lymph nodes bilaterally, measuring up to  1 centimeter in diameter. No mediastinal or hilar adenopathy. Lungs/Pleura: There is a small anterior LEFT pneumothorax, estimated to be 5-10% of lung volume. Persistent LEFT pleural effusion and associated atelectasis. Trace RIGHT pleural effusion and basilar atelectasis. Upper Abdomen: No acute abnormality. Musculoskeletal: Thoracic kyphosis. No suspicious lytic or blastic lesions are identified. Soft tissues: Irregularity, skin thickening, and asymmetry within the inferior and MEDIAL portion of the RIGHT breast, suspicious for malignancy. Small subcutaneous nodule is identified below the RIGHT breast, measuring 8 millimeters on image 110/5 Review of the MIP images confirms the above findings. IMPRESSION: 1. Technically adequate exam showing no acute pulmonary embolus. 2. Small LEFT pneumothorax, estimated to be 5-10% of lung volume. 3. Small to moderate LEFT pleural effusion and associated atelectasis. 4. Trace RIGHT pleural effusion and basilar atelectasis. 5. Cardiomegaly and coronary artery disease. 6. RIGHT breast skin thickening and irregularity suspicious for malignancy. Recommend diagnostic mammography and ultrasound when the patient is able. 7. Small bilateral axillary lymph nodes, 1 centimeter in diameter. 8. Aortic Atherosclerosis (ICD10-I70.0). These results were called by telephone at the time of interpretation on 10/16/2018 at 7:52 pm to provider Dr. Marianna Payment, Who verbally acknowledged these results. Electronically Signed   By: Nolon Nations M.D.   On: 10/16/2018 19:53   Dg Chest Port 1 View  Result Date: 10/16/2018 CLINICAL DATA:  Increased SOB as well as nausea and emesis. Hx of CHF, HTN, GERD. EXAM: PORTABLE CHEST 1 VIEW COMPARISON:  10/16/2018 FINDINGS: The heart is enlarged and stable in configuration. LEFT pleural effusion and LEFT basilar opacity are stable. Atelectasis or early infiltrate at the RIGHT lung base. No pulmonary edema. IMPRESSION: Stable appearance of the chest. Electronically  Signed   By: Nolon Nations M.D.   On: 10/16/2018 18:32   Dg Chest Port 1 View  Result Date: 10/16/2018 CLINICAL DATA:  Productive cough, shortness of breath EXAM: PORTABLE CHEST 1 VIEW COMPARISON:  10/15/2018 FINDINGS: Slight interval increase in left-sided pleural effusion with associated atelectasis or consolidation. There is increased, subtle heterogeneous opacity at the right lung base. Cardiomegaly. IMPRESSION: Slight interval increase in left-sided pleural effusion with associated atelectasis or consolidation. There is increased, subtle heterogeneous opacity at the right lung base. Findings remain consistent with infection or aspiration Electronically Signed   By: Eddie Candle M.D.   On: 10/16/2018 15:13   Dg Chest Port 1 View  Result Date: 10/15/2018 CLINICAL DATA:  Follow-up LEFT pneumothorax following thoracentesis. EXAM: PORTABLE CHEST 1 VIEW COMPARISON:  Film earlier this day FINDINGS: The very small LEFT pneumothorax is no longer identified. Mild bibasilar atelectasis again noted. Cardiomegaly is present. No other changes noted. IMPRESSION: No visualized pneumothorax. Mild basilar atelectasis. Electronically Signed   By: Margarette Canada M.D.   On: 10/15/2018 19:11   Dg Chest Portable 1 View  Result Date: 10/15/2018 CLINICAL DATA:  S/p LEFT thoracentesis. EXAM: PORTABLE CHEST 1 VIEW COMPARISON:  10/14/2018 chest  radiograph and CT FINDINGS: Near-complete resolution of visualized LEFT pleural effusion with very small apical and basilar pneumothorax. Bibasilar atelectasis again noted. Cardiomegaly again noted. IMPRESSION: Very small LEFT apical and basilar pneumothorax following LEFT thoracentesis. Near complete resolution of visualized LEFT pleural effusion. Electronically Signed   By: Margarette Canada M.D.   On: 10/15/2018 15:28    Anti-infectives: Anti-infectives (From admission, onward)   Start     Dose/Rate Route Frequency Ordered Stop   10/17/18 2000  vancomycin (VANCOCIN) IVPB 1000 mg/200  mL premix     1,000 mg 200 mL/hr over 60 Minutes Intravenous Every 24 hours 10/16/18 1938     10/16/18 2000  vancomycin (VANCOCIN) 1,500 mg in sodium chloride 0.9 % 500 mL IVPB     1,500 mg 250 mL/hr over 120 Minutes Intravenous  Once 10/16/18 1938 10/16/18 2253   10/16/18 1745  Ampicillin-Sulbactam (UNASYN) 3 g in sodium chloride 0.9 % 100 mL IVPB  Status:  Discontinued     3 g 200 mL/hr over 30 Minutes Intravenous Every 8 hours 10/16/18 1742 10/17/18 0742   10/14/18 1630  piperacillin-tazobactam (ZOSYN) IVPB 3.375 g  Status:  Discontinued     3.375 g 100 mL/hr over 30 Minutes Intravenous  Once 10/14/18 1618 10/14/18 1747   10/14/18 1615  vancomycin (VANCOCIN) 1,750 mg in sodium chloride 0.9 % 500 mL IVPB  Status:  Discontinued     1,750 mg 250 mL/hr over 120 Minutes Intravenous  Once 10/14/18 1610 10/14/18 1747   10/14/18 1615  piperacillin-tazobactam (ZOSYN) IVPB 4.5 g  Status:  Discontinued     4.5 g 200 mL/hr over 30 Minutes Intravenous  Once 10/14/18 1610 10/14/18 1618       Assessment/Plan Congestive heart failure Hypertension CKD - Cr 1.10 Small Left PTX - 5-10%. AM xray pending. Per primary. + BC - Growing both anaerobes and aerobes. Agree with IV abx.  A Fib - Went into A fib with RVR overnight. 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 Await pathology and tumor markers. If breast cancer, she will need Medical and Radiation oncology consultation as well as diagnostic imaging. I have a high suspicion for neglected cancer of the right breast. Unfortunately she has malignant cells noted from her thoracentesis c/w adenocarcinoma as mentioned above.    FEN: Heart healthy diet ID: Zosyn 9/14. Unasyn 9/16-9/17. Vancomycin 9/16 >> WBC 20.4 DVT: SCDs, Lovenox Follow-up: To be determined   LOS: 3 days     Jillyn Ledger , [facility] Surgery 10/17/2018, 10:04 AM Pager: [phone]  Agree with above.  Has malignant left pleural effusion. Right breast biopsy results pending.  She is widowed.  She has two sons - [name], who lives behind her, and Tab, who lives about 4 miles from her.  Alphonsa Overall, MD, [facility] Surgery Pager: [phone] Office phone:  [phone]

## 2018-10-17 NOTE — Progress Notes (Addendum)
  Subjective:  Patient seen at bedside this morning.  She says she feels much better than she did yesterday.  She denies shortness of breath, has continued mild cough.  Patient updated on the plan of care, all questions answered.   Objective:    Vital Signs (last 24 hours): Vitals:   10/17/18 0800 10/17/18 0900 10/17/18 0948 10/17/18 1000  BP: (!) 149/57 (!) 125/51 (!) 125/51 (!) 146/63  Pulse: 76 71 80 80  Resp: (!) 29 (!) 22  (!) 24  Temp:      TempSrc:      SpO2: 97% 98%  98%  Weight:      Height:        Physical Exam: General Alert and answers questions appropriately, no acute distress  Cardiac Regular rate and rhythm, no murmurs, rubs, or gallops  Pulmonary Clear to auscultation bilaterally without wheezes, rhonchi, or rales  Extremities Trace peripheral edema    Assessment/Plan:   Principal Problem:   Acute CHF (congestive heart failure) (HCC) Active Problems:   Mass of breast, right   CKD (chronic kidney disease) stage 3, GFR 30-59 ml/min (HCC)   Pleural effusion  Patient is a 78 year old female with past medical history of hypertension, anxiety who presented with a 3-week history of progressive worsening shortness of breath who was found to have an exudative pleural effusion as well as right breast fungating skin lesion concerning for malignancy.  # Fungating right breast mass: # Shortness of breath with exudative pleural effusion: Patient with 2 episodes of nausea, vomiting, shortness of breath, desaturations yesterday.  Patient initially placed on Unasyn given concern for aspiration pneumonia.  CT angiogram was negative for PE and showed no evidence for pneumonia.  Patient breathing comfortably, saturating well on 2 L nasal cannula.  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  KUB without evidence for obstruction.  Bedside biopsy performed of right breast mass per surgery.  Biopsy results pending, likely breast cancer.  # GPC Bacteremia: 2 out of 4 bottles  growing coag negative staph. * We will continue patient on vancomycin  # Atrial fibrillation with RVR: Patient found to be in atrial fibrillation with RVR yesterday, which may be contributing to patient's episodic shortness of breath.  Patient was given metoprolol and started on diltiazem drip.  Patient normal sinus rhythm this morning, diltiazem drip discontinued. * Metoprolol 12.5 mg twice daily * Lovenox per pharmacy consult  # Pneumothorax s/p thoracentesis: CT scan yesterday showed left-sided pneumothorax, estimated to be 5-10% of lung volume.  Suspected to be secondary to pneumothorax ex vacuo due to lung being unable to expand following fluid removal.  Diet: Regular diet DVT Ppx: Lovenox (therapeutic for A. Fib) Dispo: Anticipated discharge pending clinical improvement   Jeanmarie Hubert, MD 10/17/2018, 11:39 AM Pager: [phone]

## 2018-10-18 ENCOUNTER — Other Ambulatory Visit: Payer: Self-pay

## 2018-10-18 LAB — BODY FLUID CULTURE: Culture: NO GROWTH

## 2018-10-18 LAB — CBC
HCT: 36.1 % (ref 36.0–46.0)
Hemoglobin: 10.8 g/dL — ABNORMAL LOW (ref 12.0–15.0)
MCH: 28.6 pg (ref 26.0–34.0)
MCHC: 29.9 g/dL — ABNORMAL LOW (ref 30.0–36.0)
MCV: 95.8 fL (ref 80.0–100.0)
Platelets: 230 10*3/uL (ref 150–400)
RBC: 3.77 MIL/uL — ABNORMAL LOW (ref 3.87–5.11)
RDW: 14.8 % (ref 11.5–15.5)
WBC: 10.2 10*3/uL (ref 4.0–10.5)
nRBC: 0 % (ref 0.0–0.2)

## 2018-10-18 LAB — BASIC METABOLIC PANEL
Anion gap: 10 (ref 5–15)
BUN: 11 mg/dL (ref 8–23)
CO2: 29 mmol/L (ref 22–32)
Calcium: 8.3 mg/dL — ABNORMAL LOW (ref 8.9–10.3)
Chloride: 98 mmol/L (ref 98–111)
Creatinine, Ser: 1.01 mg/dL — ABNORMAL HIGH (ref 0.44–1.00)
GFR calc Af Amer: >60 mL/min (ref >60)
GFR calc non Af Amer: 53 mL/min — ABNORMAL LOW (ref >60)
Glucose, Bld: 112 mg/dL — ABNORMAL HIGH (ref 70–99)
Potassium: 3.9 mmol/L (ref 3.5–5.1)
Sodium: 137 mmol/L (ref 135–145)

## 2018-10-18 MED ORDER — METOPROLOL TARTRATE 12.5 MG HALF TABLET
12.5000 mg | ORAL_TABLET | Freq: Once | ORAL | Status: AC
  Administered 2018-10-18: 12.5 mg via ORAL
  Filled 2018-10-18: qty 1

## 2018-10-18 MED ORDER — METOPROLOL TARTRATE 25 MG PO TABS
25.0000 mg | ORAL_TABLET | Freq: Two times a day (BID) | ORAL | Status: DC
  Administered 2018-10-18 – 2018-10-23 (×10): 25 mg via ORAL
  Filled 2018-10-18 (×10): qty 1

## 2018-10-18 NOTE — Progress Notes (Signed)
Please see in addition to PT note:   SATURATION QUALIFICATIONS: (This note is used to comply with regulatory documentation for home oxygen)  Patient Saturations on Room Air at Rest = 95%   Patient Saturations on Room Air while Ambulating = 85%   Patient Saturations on 2 Liters of oxygen while Ambulating = 93%   Please briefly explain why patient needs home oxygen: desaturation with activity on room air  Deniece Ree PT, DPT, CBIS  Supplemental Physical Therapist [place]    Pager [phone] Acute Rehab Office [phone]

## 2018-10-18 NOTE — Progress Notes (Signed)
Eval complete, formal note to follow. Patient min guard with all mobility and no device, however did have approximately 30 second run of V-tach with gait in room which resolved with return to seated rest, also desaturated to 85% on room air with gait. RN aware of medical status and patient performance during session.  Deniece Ree PT, DPT, CBIS  Supplemental Physical Therapist [facility]    Pager [phone] Acute Rehab Office [phone]

## 2018-10-18 NOTE — Progress Notes (Addendum)
[place] [facility] Office:  [phone] General Surgery Progress Note   LOS: 4 days  POD -     Chief Complaint: Right breast mass  Assessment and Plan: 1.  Fungating right breast mass  Biopsied at bedside on 9/16 - pathology pending  Will need input from oncology when pathology final  2.  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 3.  Congestive heart failure 4.  Hypertension 5.  CKD   Creatinine - 1.01 - 10/18/2018 6.  + BC - Growing both anaerobes and aerobes.   On Vancomyci 7.  A Fib -   Went into A fib with RVR. On Dilt drip and therapeutic Lovenox    Principal Problem:   Acute CHF (congestive heart failure) (HCC) Active Problems:   Mass of breast, right   CKD (chronic kidney disease) stage 3, GFR 30-59 ml/min (HCC)   Pleural effusion   Pneumothorax   Gram-positive bacteremia  Subjective:  Doing okay.  Objective:   Vitals:   10/18/18 0500 10/18/18 0800  BP: (!) 122/48 (!) 144/60  Pulse: 73 77  Resp: (!) 26 18  Temp: 99.2 F (37.3 C) 98.5 F (36.9 C)  SpO2: 96% 97%     Intake/Output from previous day:  09/17 0701 - 09/18 0700 In: 300 [P.O.:300] Out: 800 [Urine:800]  Intake/Output this shift:  No intake/output data recorded.   Physical Exam:   General: WN older WF who is alert and oriented.    HEENT: Normal. Pupils equal.   Chest/breast - fungating medial right breast lesion that overlays sternum .   Lungs: Decreased left breath sounds   Abdomen: Soft   Lab Results:    Recent Labs    10/17/18 0339 10/18/18 0257  WBC 20.2* 10.2  HGB 11.6* 10.8*  HCT 38.9 36.1  PLT 238 230    BMET   Recent Labs    10/17/18 0339 10/18/18 0257  NA 137 137  K 4.2 3.9  CL 102 98  CO2 26 29  GLUCOSE 121* 112*  BUN 21 11  CREATININE 1.10* 1.01*  CALCIUM 7.9* 8.3*    PT/INR  No results for input(s): LABPROT, INR in the  last 72 hours.  ABG  No results for input(s): PHART, HCO3 in the last 72 hours.  Invalid input(s): PCO2, PO2   Studies/Results:  Dg Abd 1 View  Result Date: 10/16/2018 CLINICAL DATA:  Increased SOB as well as nausea and emesis. Hx of CHF, HTN, GERD. EXAM: ABDOMEN - 1 VIEW COMPARISON:  None. FINDINGS: The bowel gas pattern is normal. No radio-opaque calculi or other significant radiographic abnormality are seen. IMPRESSION: Negative. Electronically Signed   By: Nolon Nations M.D.   On: 10/16/2018 18:36   Ct Angio Chest Pe W Or Wo Contrast  Result Date: 10/16/2018 CLINICAL DATA:  Urgent patient brought down for PE due to SOB RN stated she had thoracentesis and breast bx and is now SOB and in a EXAM: CT ANGIOGRAPHY CHEST WITH CONTRAST TECHNIQUE: Multidetector CT imaging of the chest was performed using the standard protocol during bolus administration of intravenous contrast. Multiplanar CT image reconstructions and MIPs were obtained to evaluate the vascular anatomy. CONTRAST:  22mL OMNIPAQUE IOHEXOL 350 MG/ML SOLN COMPARISON:  Chest x-ray on 10/16/2018 FINDINGS: Cardiovascular: Heart size is mildly enlarged. There is atherosclerotic calcification of the  coronary arteries. Calcification of the mitral annulus. Trace pericardial effusion. The pulmonary arteries are well opacified by contrast bolus and there is no evidence for acute pulmonary embolus. Note is made of atherosclerotic calcification of the thoracic aorta not associated with aneurysm. Mediastinum/Nodes: The visualized portion of the thyroid gland has a normal appearance. Esophagus is normal/in appearance. There are 3 level I RIGHT axillary lymph nodes with abnormal morphology, there small axillary lymph nodes bilaterally, measuring up to 1 centimeter in diameter. No mediastinal or hilar adenopathy. Lungs/Pleura: There is a small anterior LEFT pneumothorax, estimated to be 5-10% of lung volume. Persistent LEFT pleural effusion and associated  atelectasis. Trace RIGHT pleural effusion and basilar atelectasis. Upper Abdomen: No acute abnormality. Musculoskeletal: Thoracic kyphosis. No suspicious lytic or blastic lesions are identified. Soft tissues: Irregularity, skin thickening, and asymmetry within the inferior and MEDIAL portion of the RIGHT breast, suspicious for malignancy. Small subcutaneous nodule is identified below the RIGHT breast, measuring 8 millimeters on image 110/5 Review of the MIP images confirms the above findings. IMPRESSION: 1. Technically adequate exam showing no acute pulmonary embolus. 2. Small LEFT pneumothorax, estimated to be 5-10% of lung volume. 3. Small to moderate LEFT pleural effusion and associated atelectasis. 4. Trace RIGHT pleural effusion and basilar atelectasis. 5. Cardiomegaly and coronary artery disease. 6. RIGHT breast skin thickening and irregularity suspicious for malignancy. Recommend diagnostic mammography and ultrasound when the patient is able. 7. Small bilateral axillary lymph nodes, 1 centimeter in diameter. 8. Aortic Atherosclerosis (ICD10-I70.0). These results were called by telephone at the time of interpretation on 10/16/2018 at 7:52 pm to provider Dr. Marianna Payment, Who verbally acknowledged these results. Electronically Signed   By: Nolon Nations M.D.   On: 10/16/2018 19:53   Dg Chest Port 1 View  Result Date: 10/16/2018 CLINICAL DATA:  Increased SOB as well as nausea and emesis. Hx of CHF, HTN, GERD. EXAM: PORTABLE CHEST 1 VIEW COMPARISON:  10/16/2018 FINDINGS: The heart is enlarged and stable in configuration. LEFT pleural effusion and LEFT basilar opacity are stable. Atelectasis or early infiltrate at the RIGHT lung base. No pulmonary edema. IMPRESSION: Stable appearance of the chest. Electronically Signed   By: Nolon Nations M.D.   On: 10/16/2018 18:32   Dg Chest Port 1 View  Result Date: 10/16/2018 CLINICAL DATA:  Productive cough, shortness of breath EXAM: PORTABLE CHEST 1 VIEW COMPARISON:   10/15/2018 FINDINGS: Slight interval increase in left-sided pleural effusion with associated atelectasis or consolidation. There is increased, subtle heterogeneous opacity at the right lung base. Cardiomegaly. IMPRESSION: Slight interval increase in left-sided pleural effusion with associated atelectasis or consolidation. There is increased, subtle heterogeneous opacity at the right lung base. Findings remain consistent with infection or aspiration Electronically Signed   By: Eddie Candle M.D.   On: 10/16/2018 15:13     Anti-infectives:   Anti-infectives (From admission, onward)   Start     Dose/Rate Route Frequency Ordered Stop   10/17/18 2000  vancomycin (VANCOCIN) IVPB 1000 mg/200 mL premix  Status:  Discontinued     1,000 mg 200 mL/hr over 60 Minutes Intravenous Every 24 hours 10/16/18 1938 10/17/18 1029   10/17/18 2000  vancomycin (VANCOCIN) IVPB 750 mg/150 ml premix     750 mg 150 mL/hr over 60 Minutes Intravenous Every 24 hours 10/17/18 1029     10/16/18 2000  vancomycin (VANCOCIN) 1,500 mg in sodium chloride 0.9 % 500 mL IVPB     1,500 mg 250 mL/hr over 120 Minutes Intravenous  Once 10/16/18  1938 10/16/18 2253   10/16/18 1745  Ampicillin-Sulbactam (UNASYN) 3 g in sodium chloride 0.9 % 100 mL IVPB  Status:  Discontinued     3 g 200 mL/hr over 30 Minutes Intravenous Every 8 hours 10/16/18 1742 10/17/18 0742   10/14/18 1630  piperacillin-tazobactam (ZOSYN) IVPB 3.375 g  Status:  Discontinued     3.375 g 100 mL/hr over 30 Minutes Intravenous  Once 10/14/18 1618 10/14/18 1747   10/14/18 1615  vancomycin (VANCOCIN) 1,750 mg in sodium chloride 0.9 % 500 mL IVPB  Status:  Discontinued     1,750 mg 250 mL/hr over 120 Minutes Intravenous  Once 10/14/18 1610 10/14/18 1747   10/14/18 1615  piperacillin-tazobactam (ZOSYN) IVPB 4.5 g  Status:  Discontinued     4.5 g 200 mL/hr over 30 Minutes Intravenous  Once 10/14/18 1610 10/14/18 1618      Alphonsa Overall, MD, FACS Pager:  [place] [facility] Office: [phone] 10/18/2018

## 2018-10-18 NOTE — Progress Notes (Signed)
CCMD notified this RN that pt HR 150s A.fib RVR. Pt with no chest pain, asymptomatic. Attempted to retrieve EKG, pt returned to NSR 80s. MD notified.  Amanda Cockayne, RN

## 2018-10-18 NOTE — Progress Notes (Signed)
Reviewed patient telemetry. There was concern that patient had a V. tach pattern but on review of telemetry this appears to be artifact and occurred while patient was moving. Patient had three beats of PVCs on separate reading.  Jeanmarie Hubert, MD 10/18/2018, 2:54 PM Pager: [phone]

## 2018-10-18 NOTE — Evaluation (Signed)
Physical Therapy Evaluation Patient Details Name: Beverly Tucker MRN: [id number] DOB: 1940-02-04 Today's Date: 10/18/2018   History of Present Illness  78yo female with 3 week history of progressive SOB; had been treated by PCP for allergies and pnuemonia, covid negative. Also reports B LE edema and lesion under R breast. CT following thoracentesis positive for small PTX, negative for PE. s/p R breast biopsy. Admitted for acute CHF. PMH HTN, anxiety  Clinical Impression   Patient received in bed with nurse tech, preparing to use the bathroom. Able to complete functional bed mobility with mod(I) and S for functional transfers without device, however once standing she had incontinent BM; able to transfer to [facility] with S and no device following this. Able to stand and perform self-care with S and no UE support. Then able to gait train approximately 63f in room, however she displayed a 30 second run of V-tach and SpO2 drop to 85% on room air, both of which resolved with seated rest and replacement of supplemental O2. She was left up in the chair with all needs met, RN aware of patient mobility and medical status during session. She will benefit from skilled PT services in the acute setting, potentially recommend skilled HHPT services moving forward pending progress.     Follow Up Recommendations Home health PT(depending on progress- if mobility improves she may not need this)    Equipment Recommendations  Rolling walker with 5" wheels    Recommendations for Other Services       Precautions / Restrictions Precautions Precautions: Fall;Other (comment) Precaution Comments: watch heart rate/rhythm, SpO2 Restrictions Weight Bearing Restrictions: No      Mobility  Bed Mobility Overal bed mobility: Modified Independent                Transfers Overall transfer level: Needs assistance Equipment used: None Transfers: Sit to/from Stand;Stand Pivot Transfers Sit to Stand: Supervision Stand  pivot transfers: Supervision       General transfer comment: S for safety and line management, no physical assist given  Ambulation/Gait Ambulation/Gait assistance: Min guard Gait Distance (Feet): 25 Feet Assistive device: None Gait Pattern/deviations: Step-through pattern;Narrow base of support;Drifts right/left Gait velocity: decreased   General Gait Details: mildly unsteady gait pattern but able to maintain balance with min guard; went into V-tach with gait and SPO2 on room air drop to 85%  Stairs            Wheelchair Mobility    Modified Rankin (Stroke Patients Only)       Balance Overall balance assessment: Mild deficits observed, not formally tested                                           Pertinent Vitals/Pain Pain Assessment: No/denies pain    Home Living Family/patient expects to be discharged to:: Private residence Living Arrangements: Alone   Type of Home: House Home Access: Stairs to enter Entrance Stairs-Rails: Can reach both Entrance Stairs-Number of Steps: 3 Home Layout: One level Home Equipment: None      Prior Function Level of Independence: Independent               Hand Dominance        Extremity/Trunk Assessment   Upper Extremity Assessment Upper Extremity Assessment: Generalized weakness    Lower Extremity Assessment Lower Extremity Assessment: Generalized weakness    Cervical / Trunk Assessment  Cervical / Trunk Assessment: Kyphotic  Communication   Communication: No difficulties  Cognition Arousal/Alertness: Awake/alert Behavior During Therapy: WFL for tasks assessed/performed Overall Cognitive Status: Within Functional Limits for tasks assessed                                        General Comments      Exercises     Assessment/Plan    PT Assessment Patient needs continued PT services  PT Problem List Decreased strength;Decreased mobility;Decreased  coordination;Decreased activity tolerance;Cardiopulmonary status limiting activity;Decreased balance       PT Treatment Interventions DME instruction;Therapeutic activities;Gait training;Therapeutic exercise;Patient/family education;Stair training;Balance training;Functional mobility training;Neuromuscular re-education    PT Goals (Current goals can be found in the Care Plan section)  Acute Rehab PT Goals Patient Stated Goal: go home PT Goal Formulation: With patient Time For Goal Achievement: 11/01/18 Potential to Achieve Goals: Good    Frequency Min 3X/week   Barriers to discharge        Co-evaluation               AM-PAC PT "6 Clicks" Mobility  Outcome Measure Help needed turning from your back to your side while in a flat bed without using bedrails?: None Help needed moving from lying on your back to sitting on the side of a flat bed without using bedrails?: None Help needed moving to and from a bed to a chair (including a wheelchair)?: None Help needed standing up from a chair using your arms (e.g., wheelchair or bedside chair)?: None Help needed to walk in hospital room?: A Little Help needed climbing 3-5 steps with a railing? : A Little 6 Click Score: 22    End of Session   Activity Tolerance: Treatment limited secondary to medical complications (Comment)(Vtach and SpO2 drop on room air) Patient left: in chair;with call bell/phone within reach Nurse Communication: Mobility status;Other (comment)(medical status during session) PT Visit Diagnosis: Unsteadiness on feet (R26.81);Muscle weakness (generalized) (M62.81)    Time: 1040-1110 PT Time Calculation (min) (ACUTE ONLY): 30 min   Charges:   PT Evaluation $PT Eval Moderate Complexity: 1 Mod PT Treatments $Gait Training: 8-22 mins        Deniece Ree PT, DPT, CBIS  Supplemental Physical Therapist [place]    Pager [phone] Acute Rehab Office [phone]

## 2018-10-18 NOTE — Progress Notes (Addendum)
  Subjective:  Patient seen at bedside this morning on rounds. The patient says she feels okay but feels depressed this AM. Patient says she felt a little nauseous yesterday. She was able to talk to her son yesterday as well.  Objective:    Vital Signs (last 24 hours): Vitals:   10/18/18 0029 10/18/18 0400 10/18/18 0500 10/18/18 0800  BP: (!) 153/58 (!) 126/48 (!) 122/48 (!) 144/60  Pulse: 81 77 73 77  Resp: 16 (!) 23 (!) 26 18  Temp: 99.3 F (37.4 C)  99.2 F (37.3 C) 98.5 F (36.9 C)  TempSrc: Oral  Oral Oral  SpO2: 95% 98% 96% 97%  Weight:   87.9 kg   Height:        Physical Exam: General Alert and answers questions appropriately, no acute distress  Cardiac Regular rate and rhythm, no murmurs, rubs, or gallops  Pulmonary Clear to auscultation bilaterally without wheezes, rhonchi, or rales  Extremities Trace peripheral edema    Assessment/Plan:   Principal Problem:   Acute CHF (congestive heart failure) (HCC) Active Problems:   Mass of breast, right   CKD (chronic kidney disease) stage 3, GFR 30-59 ml/min (HCC)   Pleural effusion   Pneumothorax   Gram-positive bacteremia  Patient is a 78 year old female with past medical history of hypertension, anxiety presented with a three-week history of progressive worsening shortness of breath who was found to have fungating right breast mass and an exudative pleural effusion with cytology revealing adenocarcinoma.    # Fungating right breast mass: # Shortness of breath with exudative pleural effusion: Patient is nauseous but without emesis, significant shortness of breath, desaturations.  Cytology from pleural fluid analysis shows adenocarcinoma, likely primary breast cancer. * Oncology consulted and will evaluate patient  # Coagulase negative staph on blood cultures: Patient was on vancomycin given concern for bacteremia.  However patient is afebrile, white count within normal limits, culture findings in 1 set likely represent  contaminant.   * Stopping vancomycin today, will monitor her symptoms  # Atrial fibrillation with RVR: Patient with episode of atrial fibrillation with RVR 2 days ago.  No known history of atrial fibrillation or subsequent episodes.  Patient off diltiazem, on metoprolol 12.5 mg twice daily.  Patient in normal sinus rhythm this morning. CHADS-VASc score of 4 which is moderate to high risk. * We will continue with therapeutic Lovenox per pharmacy consult  # Pneumothorax s/p thoracentesis: CT scan 2 days ago showed left-sided pneumothorax, estimated to be 5-10% of lung volume.  This is suspected to be secondary to a pneumothorax ex vacuo due to lung being unable to expand following fluid removal.  We will continue to monitor for symptoms.  Plan to repeat chest x-ray if symptoms worsen.   Diet: Regular diet DVT Ppx: Lovenox (therapeutic for A. Fib) Dispo: Anticipated discharge pending clinical improvement   Earlene Plater, MD 10/18/2018, 8:41 AM Pager: [phone]

## 2018-10-19 DIAGNOSIS — C50911 Malignant neoplasm of unspecified site of right female breast: Principal | ICD-10-CM

## 2018-10-19 DIAGNOSIS — Z7901 Long term (current) use of anticoagulants: Secondary | ICD-10-CM

## 2018-10-19 DIAGNOSIS — N189 Chronic kidney disease, unspecified: Secondary | ICD-10-CM

## 2018-10-19 DIAGNOSIS — I482 Chronic atrial fibrillation, unspecified: Secondary | ICD-10-CM

## 2018-10-19 DIAGNOSIS — I130 Hypertensive heart and chronic kidney disease with heart failure and stage 1 through stage 4 chronic kidney disease, or unspecified chronic kidney disease: Secondary | ICD-10-CM

## 2018-10-19 DIAGNOSIS — J910 Malignant pleural effusion: Secondary | ICD-10-CM

## 2018-10-19 DIAGNOSIS — J939 Pneumothorax, unspecified: Secondary | ICD-10-CM

## 2018-10-19 DIAGNOSIS — C50919 Malignant neoplasm of unspecified site of unspecified female breast: Secondary | ICD-10-CM

## 2018-10-19 DIAGNOSIS — I509 Heart failure, unspecified: Secondary | ICD-10-CM

## 2018-10-19 LAB — BASIC METABOLIC PANEL
Anion gap: 14 (ref 5–15)
BUN: 12 mg/dL (ref 8–23)
CO2: 26 mmol/L (ref 22–32)
Calcium: 8.3 mg/dL — ABNORMAL LOW (ref 8.9–10.3)
Chloride: 96 mmol/L — ABNORMAL LOW (ref 98–111)
Creatinine, Ser: 0.98 mg/dL (ref 0.44–1.00)
GFR calc Af Amer: >60 mL/min (ref >60)
GFR calc non Af Amer: 55 mL/min — ABNORMAL LOW (ref >60)
Glucose, Bld: 94 mg/dL (ref 70–99)
Potassium: 3.7 mmol/L (ref 3.5–5.1)
Sodium: 136 mmol/L (ref 135–145)

## 2018-10-19 LAB — CULTURE, BLOOD (ROUTINE X 2)
Culture: NO GROWTH
Special Requests: ADEQUATE

## 2018-10-19 LAB — CBC
HCT: 33.8 % — ABNORMAL LOW (ref 36.0–46.0)
Hemoglobin: 10.7 g/dL — ABNORMAL LOW (ref 12.0–15.0)
MCH: 29.7 pg (ref 26.0–34.0)
MCHC: 31.7 g/dL (ref 30.0–36.0)
MCV: 93.9 fL (ref 80.0–100.0)
Platelets: 214 10*3/uL (ref 150–400)
RBC: 3.6 MIL/uL — ABNORMAL LOW (ref 3.87–5.11)
RDW: 14.5 % (ref 11.5–15.5)
WBC: 10.3 10*3/uL (ref 4.0–10.5)
nRBC: 0 % (ref 0.0–0.2)

## 2018-10-19 MED ORDER — BENZONATATE 100 MG PO CAPS
100.0000 mg | ORAL_CAPSULE | Freq: Three times a day (TID) | ORAL | Status: DC | PRN
  Administered 2018-10-19 – 2018-10-20 (×2): 100 mg via ORAL
  Filled 2018-10-19 (×2): qty 1

## 2018-10-19 NOTE — Evaluation (Signed)
Occupational Therapy Evaluation Patient Details Name: Beverly Tucker MRN: [id number] DOB: 04-23-1940 Today's Date: 10/19/2018    History of Present Illness 78yo female with 3 week history of progressive SOB; had been treated by PCP for allergies and pnuemonia, covid negative. Also reports B LE edema and lesion under R breast. CT following thoracentesis positive for small PTX, negative for PE. s/p R breast biopsy. Admitted for acute CHF. PMH HTN, anxiety   Clinical Impression   Pt is at sup - mod I level with ADLs/ADL mobility with some fatigue with moderate activity. Pt reports that PTA,k she lived at home alone and was independent with ADLs, home mgt, driving , mowing her yard and used no AD for mobility. Pt on 2 L O2 during activity this session and O2 SATs remained >90% throughout, however pt reports some fatigue after ADL activities. Pt would benefit from acute OT services to address impairments to maximize level of function and safety    Follow Up Recommendations  No OT follow up;Supervision - Intermittent    Equipment Recommendations  Tub/shower seat    Recommendations for Other Services       Precautions / Restrictions Precautions Precautions: Fall;Other (comment) Precaution Comments: watch heart rate/rhythm, SpO2 Restrictions Weight Bearing Restrictions: No      Mobility Bed Mobility               General bed mobility comments: pt sittng EOB with RN upon arrival  Transfers Overall transfer level: Needs assistance Equipment used: None Transfers: Sit to/from [employer] Transfers Sit to Stand: Supervision Stand pivot transfers: Supervision       General transfer comment: S for safety and line management, no physical assist given    Balance Overall balance assessment: Mild deficits observed, not formally tested                                         ADL either performed or assessed with clinical judgement   ADL Overall ADL's :  Needs assistance/impaired Eating/Feeding: Independent;Sitting   Grooming: Wash/dry hands;Wash/dry face;Set up;Sitting   Upper Body Bathing: Set up;Sitting   Lower Body Bathing: Supervison/ safety;Sit to/from stand   Upper Body Dressing : Set up;Sitting   Lower Body Dressing: Supervision/safety   Toilet Transfer: Supervision/safety;Ambulation;BSC   Toileting- Water quality scientist and Hygiene: Supervision/safety;Sit to/from stand       Functional mobility during ADLs: Supervision/safety General ADL Comments: mod I - sup for safety, no physical assist needed     Vision Patient Visual Report: No change from baseline       Perception     Praxis      Pertinent Vitals/Pain Pain Assessment: No/denies pain     Hand Dominance Right   Extremity/Trunk Assessment Upper Extremity Assessment Upper Extremity Assessment: Generalized weakness   Lower Extremity Assessment Lower Extremity Assessment: Defer to PT evaluation   Cervical / Trunk Assessment Cervical / Trunk Assessment: Kyphotic   Communication Communication Communication: No difficulties   Cognition Arousal/Alertness: Awake/alert Behavior During Therapy: WFL for tasks assessed/performed Overall Cognitive Status: Within Functional Limits for tasks assessed                                     General Comments       Exercises     Shoulder Instructions  Home Living Family/patient expects to be discharged to:: Private residence Living Arrangements: Alone Available Help at Discharge: Family Type of Home: House Home Access: Stairs to enter Technical brewer of Steps: 3 Entrance Stairs-Rails: Can reach both Home Layout: One level     Bathroom Shower/Tub: Teacher, early years/pre: Standard     Home Equipment: None          Prior Functioning/Environment Level of Independence: Independent                 OT Problem List: Decreased activity tolerance;Decreased  knowledge of use of DME or AE      OT Treatment/Interventions: Self-care/ADL training;DME and/or AE instruction;Therapeutic activities;Energy conservation;Patient/family education    OT Goals(Current goals can be found in the care plan section) Acute Rehab OT Goals Patient Stated Goal: go home OT Goal Formulation: With patient Time For Goal Achievement: 11/02/18 Potential to Achieve Goals: Good ADL Goals Pt Will Perform Grooming: with supervision;with modified independence;standing Pt Will Perform Lower Body Bathing: with modified independence Pt Will Perform Lower Body Dressing: with modified independence Pt Will Transfer to Toilet: with modified independence;Independently;ambulating Additional ADL Goal #1: Pt will verbalize and demo 3 energy conservation technqiues for and during ADLs/ADL mobility  OT Frequency: Min 2X/week   Barriers to D/C:            Co-evaluation              AM-PAC OT "6 Clicks" Daily Activity     Outcome Measure Help from another person eating meals?: None Help from another person taking care of personal grooming?: None Help from another person toileting, which includes using toliet, bedpan, or urinal?: A Little Help from another person bathing (including washing, rinsing, drying)?: A Little Help from another person to put on and taking off regular upper body clothing?: None Help from another person to put on and taking off regular lower body clothing?: A Little 6 Click Score: 21   End of Session Equipment Utilized During Treatment: Gait belt;Other (comment)(BSC)  Activity Tolerance: Patient tolerated treatment well Patient left: in chair;with call bell/phone within reach  OT Visit Diagnosis: Unsteadiness on feet (R26.81);Muscle weakness (generalized) (M62.81)                Time: [id number] OT Time Calculation (min): 26 min Charges:  OT General Charges $OT Visit: 1 Visit OT Evaluation $OT Eval Moderate Complexity: 1 Mod OT  Treatments $Self Care/Home Management : 8-22 mins    Emmit Alexanders [facility] 10/19/2018, 11:35 AM

## 2018-10-19 NOTE — Consult Note (Signed)
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  Patient has a history of hypertension, anxiety and initially presented to the emergency room on 10/05/2018 with diagnosis of possible pneumonia and SVT related to pneumonia.  She was also noted to have ulceration under her right breast.  She was discharged on oral antibiotics[Keflex and doxycycline] and fluconazole with OB/GYN follow-up for her breast changes. She notes that she has had felt some changes in her breasts for the last 3 to 6 months but more notably uncomfortable over the last 2 months.  She notes that she treated with some cream she was at the drugstore without any benefit.  EMS had noted a large wound which appeared to be a fungating tumor.  She presented this time with back pain and increasing progressive shortness of breath.  CT chest without contrast on 10/14/2018 showed no overt pneumonia.  Moderate left and small right-sided pleural effusions.  Small lung nodules most evident in the right middle lobe.  Mild interstitial edema.  5.7 x 3.5 x 4.9 cm right breast mass with more generalized breast thickening.  Bilateral axillary lymph nodes noted.  Left-sided pleural effusion was tapped and noted to be exudative with cytology showing malignant cells consistent with adenocarcinoma.  Developed some mild pneumothorax that is being managed conservatively. CTA chest (10/16/2018) showed no evidence of PE.  Small left pneumothorax 5 to 10% of lung volume.  She was noted to  be COVID 19 negative.  Her right breast fungating mass has been biopsied by Dr. Alphonsa Overall at bedside and the pathology results from this are currently pending.  Today the patient notes that her breathing is improved since admission.  Does feel weak and still has dyspnea with ambulation.  Notes some bothersome cough. Patient expresses gratitude for all her cares thus far.     MEDICAL HISTORY:  Past Medical History:  Diagnosis Date   Acute CHF (congestive heart failure) ([facility]) 10/2018   GERD (gastroesophageal reflux disease)    Hypertension     SURGICAL HISTORY: Past Surgical History:  Procedure Laterality Date   CHOLECYSTECTOMY      SOCIAL HISTORY: Social History   Socioeconomic History   Marital status: Widowed    Spouse name: Not on file   Number of children: Not on file   Years of education: Not on file   Highest education level: Not on file  Occupational History   Not on file  Social Needs   Financial resource strain: Not on file   Food insecurity    Worry: Not on file    Inability: Not on file   Transportation needs    Medical: Not on file    Non-medical: Not on file  Tobacco Use   Smoking status: Never Smoker   Smokeless tobacco: Never Used  Substance and Sexual Activity   Alcohol use: Never    Frequency: Never   Drug use: Never   Sexual activity: Not on file  Lifestyle   Physical activity    Days per week: Not on file  Minutes per session: Not on file   Stress: Not on file  Relationships   Social connections    Talks on phone: Not on file    Gets together: Not on file    Attends religious service: Not on file    Active member of club or organization: Not on file    Attends meetings of clubs or organizations: Not on file    Relationship status: Not on file   Intimate partner violence    Fear of current or ex partner: Not on file    Emotionally abused: Not on file    Physically abused: Not on file    Forced sexual  activity: Not on file  Other Topics Concern   Not on file  Social History Narrative   Not on file    FAMILY HISTORY: History reviewed. No pertinent family history.  ALLERGIES:  has No Known Allergies.  MEDICATIONS:  Current Facility-Administered Medications  Medication Dose Route Frequency Provider Last Rate Last Dose   acetaminophen (TYLENOL) tablet 650 mg  650 mg Oral Q6H PRN Kathi Ludwig, MD   650 mg at 10/15/18 2203   Or   acetaminophen (TYLENOL) suppository 650 mg  650 mg Rectal Q6H PRN Kathi Ludwig, MD       ALPRAZolam Duanne Moron) tablet 0.25-0.5 mg  0.25-0.5 mg Oral BID PRN Kathi Ludwig, MD   0.25 mg at 10/18/18 2349   enoxaparin (LOVENOX) injection 90 mg  90 mg Subcutaneous Q12H Karren Cobble, RPH   90 mg at 10/19/18 1016   metoprolol tartrate (LOPRESSOR) tablet 25 mg  25 mg Oral BID Kathi Ludwig, MD   25 mg at 10/19/18 1016   ondansetron (ZOFRAN) injection 4 mg  4 mg Intravenous Q6H PRN Earnstine Regal, PA-C   4 mg at 10/16/18 1441   promethazine (PHENERGAN) injection 12.5 mg  12.5 mg Intravenous Q6H PRN Welford Roche, MD   12.5 mg at 10/16/18 1736    REVIEW OF SYSTEMS:    10 Point review of Systems was done is negative except as noted above.  PHYSICAL EXAMINATION: ECOG PERFORMANCE STATUS: 2 - Symptomatic, <50% confined to bed  . Vitals:   10/19/18 0637 10/19/18 1000  BP: (!) 150/63 (!) 156/77  Pulse: 66 93  Resp: (!) 27 (!) 25  Temp: 98.9 F (37.2 C) 99.9 F (37.7 C)  SpO2: 100% 97%   Filed Weights   10/17/18 0423 10/18/18 0500 10/19/18 0637  Weight: 194 lb 10.7 oz (88.3 kg) 193 lb 12.6 oz (87.9 kg) 193 lb 9 oz (87.8 kg)   .Body mass index is 31.24 kg/m.  GENERAL:alert, in no acute distress and comfortable SKIN: no acute rashes, no significant lesions EYES: conjunctiva are pink and non-injected, sclera anicteric OROPHARYNX: MMM, NECK: supple, no JVD LYMPH: Bilateral small palpable axillary lymph nodes  no palpable cervical or inguinal lymphadenopathy LUNGS: Few scattered rhonchi, decreased breath sounds bilateral lung bases Left>RT Breast: Notable fungating mass on the inferior aspect of her right breast that appears overtly malignant. HEART: Irregular S1-S2 ABDOMEN:  normoactive bowel sounds , non tender, not distended. Extremity: no pedal edema PSYCH: alert & oriented x 3 with fluent speech NEURO: no focal motor/sensory deficits  LABORATORY DATA:  I have reviewed the data as listed  . CBC Latest Ref Rng & Units 10/19/2018 10/18/2018 10/17/2018  WBC 4.0 - 10.5 K/uL 10.3 10.2 20.2(H)  Hemoglobin 12.0 - 15.0 g/dL 10.7(L) 10.8(L) 11.6(L)  Hematocrit 36.0 - 46.0 % 33.8(L) 36.1 38.9  Platelets 150 -  400 K/uL 214 230 238    . CMP Latest Ref Rng & Units 10/19/2018 10/18/2018 10/17/2018  Glucose 70 - 99 mg/dL 94 112(H) 121(H)  BUN 8 - 23 mg/dL '12 11 21  ' Creatinine 0.44 - 1.00 mg/dL 0.98 1.01(H) 1.10(H)  Sodium 135 - 145 mmol/L 136 137 137  Potassium 3.5 - 5.1 mmol/L 3.7 3.9 4.2  Chloride 98 - 111 mmol/L 96(L) 98 102  CO2 22 - 32 mmol/L '26 29 26  ' Calcium 8.9 - 10.3 mg/dL 8.3(L) 8.3(L) 7.9(L)  Total Protein 6.5 - 8.1 g/dL - - -  Total Bilirubin 0.3 - 1.2 mg/dL - - -  Alkaline Phos 38 - 126 U/L - - -  AST 15 - 41 U/L - - -  ALT 0 - 44 U/L - - -     RADIOGRAPHIC STUDIES: I have personally reviewed the radiological images as listed and agreed with the findings in the report. Dg Abd 1 View  Result Date: 10/16/2018 CLINICAL DATA:  Increased SOB as well as nausea and emesis. Hx of CHF, HTN, GERD. EXAM: ABDOMEN - 1 VIEW COMPARISON:  None. FINDINGS: The bowel gas pattern is normal. No radio-opaque calculi or other significant radiographic abnormality are seen. IMPRESSION: Negative. Electronically Signed   By: Nolon Nations M.D.   On: 10/16/2018 18:36   Ct Chest Wo Contrast  Result Date: 10/14/2018 CLINICAL DATA:  78 year old female with past medical history of hypertension who presents  with a 3-week history of progressive worsening of shortness of breath. Patient reports that symptoms started with a runny nose and cough productive of clear white mucus. Patient saw outpatient provider, was COVID negative, was started on allergy medication. Shortness of breath continue to progress, saw provider on 9/12, was diagnosed with pneumonia, and started on Keflex. At that visit, patient was also started on doxycycline for cellulitis (under right breast). EXAM: CT CHEST WITHOUT CONTRAST TECHNIQUE: Multidetector CT imaging of the chest was performed following the standard protocol without IV contrast. COMPARISON:  Current chest radiograph. FINDINGS: Cardiovascular: Mild enlargement of the heart. No pericardial effusion. Three-vessel coronary artery calcifications. Great vessels normal in caliber. There are aortic atherosclerotic calcifications. Mediastinum/Nodes: No neck base masses or enlarged lymph nodes. No mediastinal or hilar masses or adenopathy. Trachea and esophagus are unremarkable. Lungs/Pleura: Moderate-sized left and small right pleural effusions. Dependent atelectasis in the lower lobes, left greater than right. Mild interstitial thickening most evident the lower lobes. Linear opacities are noted in the left upper lobe posteriorly and in the lingula consistent with atelectasis. There are a few small irregular nodules, largest subpleural, right lower lobe, image 81, series 4, 6 mm. Mild scarring at the apices. No pneumothorax. Upper Abdomen: No acute findings. Status post cholecystectomy. Aortic atherosclerosis. Musculoskeletal: No fracture or acute finding. No osteoblastic or osteolytic lesions. Chest wall: There is masslike soft tissue that is contiguous with the skin medial aspect of the right breast, measuring approximately 5.7 x 3.5 x 4.9 cm. There is more generalized breast skin thickening bilaterally. Mildly prominent bilateral axillary lymph nodes are noted, none pathologically enlarged by  size criteria. IMPRESSION: 1. No convincing pneumonia. 2. Moderate left and small right pleural effusions. There is mild bilateral interstitial thickening most evident in the lower lungs. This suggests mild interstitial edema. 3. Dependent atelectasis in the lower lobes, left greater than right, with milder atelectasis in the left upper lobe. 4. Small lung nodules most evident in the right middle lobe. 5. Abnormal soft tissue contiguous with the skin of  the medial right breast. Although this may be infectious/inflammatory, neoplastic disease should be considered. Consider follow-up diagnostic mammography and possible right breast ultrasound for further assessment. Aortic Atherosclerosis (ICD10-I70.0). Electronically Signed   By: Lajean Manes M.D.   On: 10/14/2018 20:05   Ct Angio Chest Pe W Or Wo Contrast  Result Date: 10/16/2018 CLINICAL DATA:  Urgent patient brought down for PE due to SOB RN stated she had thoracentesis and breast bx and is now SOB and in a EXAM: CT ANGIOGRAPHY CHEST WITH CONTRAST TECHNIQUE: Multidetector CT imaging of the chest was performed using the standard protocol during bolus administration of intravenous contrast. Multiplanar CT image reconstructions and MIPs were obtained to evaluate the vascular anatomy. CONTRAST:  86m OMNIPAQUE IOHEXOL 350 MG/ML SOLN COMPARISON:  Chest x-ray on 10/16/2018 FINDINGS: Cardiovascular: Heart size is mildly enlarged. There is atherosclerotic calcification of the coronary arteries. Calcification of the mitral annulus. Trace pericardial effusion. The pulmonary arteries are well opacified by contrast bolus and there is no evidence for acute pulmonary embolus. Note is made of atherosclerotic calcification of the thoracic aorta not associated with aneurysm. Mediastinum/Nodes: The visualized portion of the thyroid gland has a normal appearance. Esophagus is normal/in appearance. There are 3 level I RIGHT axillary lymph nodes with abnormal morphology, there  small axillary lymph nodes bilaterally, measuring up to 1 centimeter in diameter. No mediastinal or hilar adenopathy. Lungs/Pleura: There is a small anterior LEFT pneumothorax, estimated to be 5-10% of lung volume. Persistent LEFT pleural effusion and associated atelectasis. Trace RIGHT pleural effusion and basilar atelectasis. Upper Abdomen: No acute abnormality. Musculoskeletal: Thoracic kyphosis. No suspicious lytic or blastic lesions are identified. Soft tissues: Irregularity, skin thickening, and asymmetry within the inferior and MEDIAL portion of the RIGHT breast, suspicious for malignancy. Small subcutaneous nodule is identified below the RIGHT breast, measuring 8 millimeters on image 110/5 Review of the MIP images confirms the above findings. IMPRESSION: 1. Technically adequate exam showing no acute pulmonary embolus. 2. Small LEFT pneumothorax, estimated to be 5-10% of lung volume. 3. Small to moderate LEFT pleural effusion and associated atelectasis. 4. Trace RIGHT pleural effusion and basilar atelectasis. 5. Cardiomegaly and coronary artery disease. 6. RIGHT breast skin thickening and irregularity suspicious for malignancy. Recommend diagnostic mammography and ultrasound when the patient is able. 7. Small bilateral axillary lymph nodes, 1 centimeter in diameter. 8. Aortic Atherosclerosis (ICD10-I70.0). These results were called by telephone at the time of interpretation on 10/16/2018 at 7:52 pm to provider Dr. CMarianna Payment Who verbally acknowledged these results. Electronically Signed   By: ENolon NationsM.D.   On: 10/16/2018 19:53   Dg Chest Port 1 View  Result Date: 10/16/2018 CLINICAL DATA:  Increased SOB as well as nausea and emesis. Hx of CHF, HTN, GERD. EXAM: PORTABLE CHEST 1 VIEW COMPARISON:  10/16/2018 FINDINGS: The heart is enlarged and stable in configuration. LEFT pleural effusion and LEFT basilar opacity are stable. Atelectasis or early infiltrate at the RIGHT lung base. No pulmonary edema.  IMPRESSION: Stable appearance of the chest. Electronically Signed   By: ENolon NationsM.D.   On: 10/16/2018 18:32   Dg Chest Port 1 View  Result Date: 10/16/2018 CLINICAL DATA:  Productive cough, shortness of breath EXAM: PORTABLE CHEST 1 VIEW COMPARISON:  10/15/2018 FINDINGS: Slight interval increase in left-sided pleural effusion with associated atelectasis or consolidation. There is increased, subtle heterogeneous opacity at the right lung base. Cardiomegaly. IMPRESSION: Slight interval increase in left-sided pleural effusion with associated atelectasis or consolidation. There is increased, subtle  heterogeneous opacity at the right lung base. Findings remain consistent with infection or aspiration Electronically Signed   By: Eddie Candle M.D.   On: 10/16/2018 15:13   Dg Chest Port 1 View  Result Date: 10/15/2018 CLINICAL DATA:  Follow-up LEFT pneumothorax following thoracentesis. EXAM: PORTABLE CHEST 1 VIEW COMPARISON:  Film earlier this day FINDINGS: The very small LEFT pneumothorax is no longer identified. Mild bibasilar atelectasis again noted. Cardiomegaly is present. No other changes noted. IMPRESSION: No visualized pneumothorax. Mild basilar atelectasis. Electronically Signed   By: Margarette Canada M.D.   On: 10/15/2018 19:11   Dg Chest Portable 1 View  Result Date: 10/15/2018 CLINICAL DATA:  S/p LEFT thoracentesis. EXAM: PORTABLE CHEST 1 VIEW COMPARISON:  10/14/2018 chest radiograph and CT FINDINGS: Near-complete resolution of visualized LEFT pleural effusion with very small apical and basilar pneumothorax. Bibasilar atelectasis again noted. Cardiomegaly again noted. IMPRESSION: Very small LEFT apical and basilar pneumothorax following LEFT thoracentesis. Near complete resolution of visualized LEFT pleural effusion. Electronically Signed   By: Margarette Canada M.D.   On: 10/15/2018 15:28   Dg Chest Port 1 View  Result Date: 10/14/2018 CLINICAL DATA:  Shortness of breath EXAM: PORTABLE CHEST 1 VIEW  COMPARISON:  Chest radiograph dated 10/05/2018. FINDINGS: The heart appears enlarged accounting for technique. There is a small a moderate left pleural effusion with associated atelectasis/airspace disease. There is mild right basilar atelectasis/airspace disease. There is no pneumothorax. The visualized skeletal structures are unremarkable. IMPRESSION: Small to moderate left pleural effusion with associated atelectasis/airspace disease. Mild right basilar atelectasis/airspace disease. Cardiomegaly. Electronically Signed   By: Zerita Boers M.D.   On: 10/14/2018 14:38   Dg Chest Portable 1 View  Result Date: 10/05/2018 CLINICAL DATA:  Shortness of breath for 2 weeks. EXAM: PORTABLE CHEST 1 VIEW COMPARISON:  None. FINDINGS: External pacing wires overlie the thorax. Cardiomediastinal silhouette is normal. Mediastinal contours appear intact. Left lower lobe airspace consolidation versus atelectasis. Osseous structures are without acute abnormality. Soft tissues are grossly normal. IMPRESSION: Left lower lobe airspace consolidation versus atelectasis. Electronically Signed   By: Fidela Salisbury M.D.   On: 10/05/2018 18:08   Echo on 10/15/2018:  IMPRESSIONS   1. The left ventricle has hyperdynamic systolic function, with an ejection fraction of >65%. The cavity size was normal. Left ventricular diastolic Doppler parameters are consistent with impaired relaxation. Indeterminate filling pressures The E/e' is  8-15. No evidence of left ventricular regional wall motion abnormalities.  2. The right ventricle has normal systolic function. The cavity was normal. There is no increase in right ventricular wall thickness.  3. The mitral valve is abnormal. Mild thickening of the mitral valve leaflet. There is mild to moderate mitral annular calcification present.  4. The aortic valve was not well visualized. No stenosis of the aortic valve.  5. The aorta is normal unless otherwise noted.  ASSESSMENT & PLAN:    78 year old female with  #1 Fungating right breast mass This was biopsied on 9/16 by Dr. Lucia Gaskins at bedside-pathology results are pending as of today.  #2 moderate left and small right-sided pleural effusion with shortness of breath. 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  IHC not done to determine primary site. Pneumothorax status post thoracentesis  #3 Congestive heart failure #4 Atrial fibrillation with RVR on therapeutic anticoagulation #5 CKD #6 hypertension #7 coag negative staph on blood cultures thought to be contaminant  PLAN -Appreciate excellent hospitalist cares for this patient's medical concerns. -We discussed all  her lab findings thus far as well as her findings of left-sided malignant pleural effusion with cytology showing malignant cells consistent with adenocarcinoma. 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 -will order baseline tumor markers ca 15-3 and ca 27 29 -Formal pathology of her right breast mass biopsy is currently pending.  She will need prognostic indicator profile with ER/PR/HER-2 neu testing. -If hemodynamically stable will need CT abdomen and pelvis, Whole body bone scan and MRI of the brain with and without contrast for completion of her staging work-up. -tessalon perles prn for cough -We discussed that her presentation of the malignancy is metastatic and though the treatment is not curative there can be quite significant and potentially durable response depending on her prognostication markers and her ability to handle treatments and the status of her other co-morbidities. -We initiated goals of care discussion and would proceed with more specific discussions once final pathology results are available. -We shall set her up for outpatient medical oncology follow-up with me/Dr [name] in 1-2 weeks.  We shall follow up with the  patient to discuss the pathology in the hospital if available results prior to discharge. -might need possible consider for rpt US guided thoracentesis if symptomatic though higher threshold for rpt given pneumothorax -PT/OT evaluation -- home with HHS vs SNF on discharge. -Please call if any other acute oncologic concerns arise.  I appreciate the opportunity to be of service to this wonderful person.  All of the patients questions were answered with apparent satisfaction. The patient knows to call the clinic with any problems, questions or concerns.  I spent 60 minutes counseling the patient face to face. The total time spent in the appointment was 80 minutes and more than 50% was on counseling and direct patient cares.    Sullivan Lone MD [place] AAHIVMS [facility] Hematology/Oncology Physician [facility]  (Office):       [phone] (Work cell):  [phone] (Fax):           [phone]  10/19/2018 12:00 PM

## 2018-10-19 NOTE — Progress Notes (Signed)
Oncology consult received --patient will be seen today  Sullivan Lone MD MS

## 2018-10-19 NOTE — Plan of Care (Signed)

## 2018-10-19 NOTE — Progress Notes (Signed)
  Subjective:  Patient seen at bedside this morning.  Patient reports breathing comfortably when sitting down, but does feel weak and have shortness of breath when ambulating.  Patient also reports discomfort from dry nose due to oxygen.  We discussed plan of care informed that we are waiting on biopsy results.    Patient asked about visitation policy, as she thought that it would not be possible for both her sons to visit.  We checked with nursing staff and it is okay for both sons to see her as long as they are visiting 1 at a time and on different days.  Patient was happy to hear this.    Objective:    Vital Signs (last 24 hours): Vitals:   10/18/18 2051 10/18/18 2325 10/19/18 0637 10/19/18 1000  BP: (!) 150/69 139/69 (!) 150/63 (!) 156/77  Pulse: 81 75 66 93  Resp: (!) 23 18 (!) 27 (!) 25  Temp: 99.6 F (37.6 C) 98.5 F (36.9 C) 98.9 F (37.2 C) 99.9 F (37.7 C)  TempSrc: Axillary Oral Oral Axillary  SpO2: 98% 98% 100% 97%  Weight:   87.8 kg   Height:        Physical Exam: General Alert and answers questions appropriately, no acute distress  Cardiac Regular rate and rhythm, no murmurs, rubs, or gallops  Pulmonary Mildly decreased breath sounds on left side, no rales/rhonchi  Extremities No peripheral edema    Assessment/Plan:   Principal Problem:   Acute CHF (congestive heart failure) (HCC) Active Problems:   Mass of breast, right   CKD (chronic kidney disease) stage 3, GFR 30-59 ml/min (HCC)   Pleural effusion   Pneumothorax   Gram-positive bacteremia  Patient is a 78 year old female with past medical history of hypertension, anxiety who presented with a 3-week history of progressive worsening shortness of breath who was found to have fungating right breast mass and exudative pleural effusion with cytology revealing adenocarcinoma.  # Fungating right breast mass: # Shortness of breath with exudative pleural effusion: Patient continues to have shortness of breath  with ambulation.  Mildly decreased breath sounds on the left side.  Cytology from pleural fluid analysis shows adenocarcinoma, likely primary breast cancer, awaiting biopsy results (from skin lesion). * Oncology consulted and will evaluate patient  # Coagulase negative staph on blood cultures: Patient was on vancomycin given concern for bacteremia.  However, given that patient was afebrile, white count within normal limits, culture findings from 1 set, these results were thought to represent contaminant and vancomycin was stopped yesterday. Patient remains without infectious symptoms off antibiotics.  # Atrial fibrillation with RVR: Patient with episodes of atrial fibrillation with RVR 2 days ago, no subsequent episodes.  There was concern for ventricular tachycardia pattern yesterday, but on review this was artifact.  There was also concern for brief episode of A. fib with RVR yesterday which returned to normal sinus before EKG could be obtained.  On review of telemetry, this appears to have been an AVNRT rhythm.  # Pneumothorax s/p thoracentesis: CT scan 3 days ago showed left-sided pneumothorax, estimated to be 5-10% of lung volume.  This is suspected to be secondary to a pneumothorax ex vacuo due to lung being unable to expand following fluid removal.  We will continue to monitor for symptoms.  Plan to repeat chest x-ray if symptoms worsen.  Diet: Regular DVT Ppx: Lovenox Dispo: Anticipated discharge pending clinical improvement and further diagnostic workup  Jeanmarie Hubert, MD 10/19/2018, 10:49 AM Pager: [phone]

## 2018-10-20 LAB — CANCER ANTIGEN 15-3: CA 15-3: 14.8 U/mL (ref 0.0–25.0)

## 2018-10-20 LAB — BASIC METABOLIC PANEL
Anion gap: 10 (ref 5–15)
BUN: 10 mg/dL (ref 8–23)
CO2: 30 mmol/L (ref 22–32)
Calcium: 8.3 mg/dL — ABNORMAL LOW (ref 8.9–10.3)
Chloride: 97 mmol/L — ABNORMAL LOW (ref 98–111)
Creatinine, Ser: 0.96 mg/dL (ref 0.44–1.00)
GFR calc Af Amer: >60 mL/min (ref >60)
GFR calc non Af Amer: 56 mL/min — ABNORMAL LOW (ref >60)
Glucose, Bld: 114 mg/dL — ABNORMAL HIGH (ref 70–99)
Potassium: 3.5 mmol/L (ref 3.5–5.1)
Sodium: 137 mmol/L (ref 135–145)

## 2018-10-20 LAB — CBC
HCT: 33.1 % — ABNORMAL LOW (ref 36.0–46.0)
Hemoglobin: 10.4 g/dL — ABNORMAL LOW (ref 12.0–15.0)
MCH: 29.1 pg (ref 26.0–34.0)
MCHC: 31.4 g/dL (ref 30.0–36.0)
MCV: 92.7 fL (ref 80.0–100.0)
Platelets: 243 10*3/uL (ref 150–400)
RBC: 3.57 MIL/uL — ABNORMAL LOW (ref 3.87–5.11)
RDW: 14.3 % (ref 11.5–15.5)
WBC: 8.7 10*3/uL (ref 4.0–10.5)
nRBC: 0 % (ref 0.0–0.2)

## 2018-10-20 MED ORDER — LISINOPRIL 10 MG PO TABS
10.0000 mg | ORAL_TABLET | Freq: Every day | ORAL | Status: DC
  Administered 2018-10-20 – 2018-10-23 (×4): 10 mg via ORAL
  Filled 2018-10-20 (×4): qty 1

## 2018-10-20 MED ORDER — ENOXAPARIN SODIUM 100 MG/ML ~~LOC~~ SOLN
85.0000 mg | Freq: Two times a day (BID) | SUBCUTANEOUS | Status: DC
  Administered 2018-10-20 – 2018-10-23 (×6): 85 mg via SUBCUTANEOUS
  Filled 2018-10-20 (×7): qty 1

## 2018-10-20 NOTE — Progress Notes (Signed)
[place] for Lovenox Indication: atrial fibrillation  No Known Allergies  Patient Measurements: Height: 5\' 6"  (167.6 cm) Weight: 188 lb 4.4 oz (85.4 kg) IBW/kg (Calculated) : 59.3  Vital Signs: Temp: 98.4 F (36.9 C) (09/20 0829) Temp Source: Oral (09/20 0829) BP: 163/64 (09/20 0829) Pulse Rate: 81 (09/20 0829)  Labs: Recent Labs    10/18/18 0257 10/19/18 0257 10/20/18 0303  HGB 10.8* 10.7* 10.4*  HCT 36.1 33.8* 33.1*  PLT 230 214 243  CREATININE 1.01* 0.98 0.96    Estimated Creatinine Clearance: 52.3 mL/min (by C-G formula based on SCr of 0.96 mg/dL).   Medical History: Past Medical History:  Diagnosis Date  . Acute CHF (congestive heart failure) ([place]) 10/2018  . GERD (gastroesophageal reflux disease)   . Hypertension    Assessment: 78 year old female continues on Lovenox for Afib Scr stable CBC stable  Goal of Therapy:  Anti-Xa level 0.6-1 units/ml 4hrs after LMWH dose given Monitor platelets by anticoagulation protocol: Yes   Plan:  Lovenox 85 mg sq Q 12 hours CBC q72h  Thank you Anette Guarneri, PharmD 10/20/2018,9:47 AM

## 2018-10-20 NOTE — Progress Notes (Signed)
   Subjective: The patient was sitting up in her chair today upon entering the room whereupon my entering she denied discomfort. She stated that she remains minimally short of breath while ambulating but this was tolerable thus far today. She generally understands the plan moving forward and awaits treatment with much anticipation.   Objective:  Vital signs in last 24 hours: Vitals:   10/20/18 0412 10/20/18 0446 10/20/18 0457 10/20/18 0829  BP:    (!) 163/64  Pulse:    81  Resp: 18 20  (!) 22  Temp:  98.2 F (36.8 C)  98.4 F (36.9 C)  TempSrc:  Oral  Oral  SpO2:  98%  99%  Weight:   85.4 kg   Height:       General: A/O x4, in no acute distress, afebrile, nondiaphoretic HEENT: PEERL, EMO intact Cardio: RRR, no mrg's  Pulmonary: CTA on the right, decreased breath sounds on the left lower with dullness to percussion of the same region Abdomen: Bowel sounds normal, soft, nontender  Psych: Appropriate affect, not depressed in appearance, engages well  Assessment/Plan:  Principal Problem:   Acute CHF (congestive heart failure) (HCC) Active Problems:   Shortness of breath   Mass of breast, right   CKD (chronic kidney disease) stage 3, GFR 30-59 ml/min (HCC)   Malignant pleural effusion   Pneumothorax   Gram-positive bacteremia   Metastatic breast cancer [facility])  Patient is a 78 year old female with a past medical history notable for HTN, and anxiety who presented initially with a 3-week history of progressive worsening shortness of breath.  She was found to have a large right-sided fungating breast mass for which biopsy completed with results pending.  In addition there was a large left-sided pleural effusion for thoracentesis was completed and noted exudative fluid with cytology positive for adenocarcinoma.  She is been evaluated by oncology Dr. [name] and by general surgery.  She will follow-up with oncology at discharge once clinically stable.  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  Awaiting skin biopsy results.  She is been seen by oncology who are recommending extensive evaluation once pathology for the breast mass returns.  Unfortunately, she has continued to be mildly symptomatic from the effusion and may require repeat thoracentesis but I agree with a higher threshold given her expansion pneumothorax with a prior [place]. -Follow-up skin biopsy pathology, uncertain as to the delay in processing  Atrial fibrillation with RVR: Likely secondary to acute illness.  Patient has had intermittent episodes of increased heart rate but the most recent appears to be sinus tach versus AVNRT.  We will continue to monitor her with telemetry during her admission.  She will likely need to consider long-term anticoagulation therapy but currently on therapeutic Lovenox. -Continue Lovenox --Continue metoprolol 25mg  BID --Start lisinopril 10mg  daily  Diet: Regular Code: Full DVT PPX: Full dose enoxaparin Fluids: N/A Dispo: Anticipated discharge in approximately 1-2 day(s).   Kathi Ludwig, MD 10/20/2018, 10:09 AM Pager: # [phone]

## 2018-10-20 NOTE — Plan of Care (Signed)

## 2018-10-21 ENCOUNTER — Telehealth: Payer: Self-pay | Admitting: Hematology

## 2018-10-21 ENCOUNTER — Inpatient Hospital Stay (HOSPITAL_COMMUNITY): Payer: Medicare HMO

## 2018-10-21 ENCOUNTER — Encounter: Payer: Self-pay | Admitting: Hematology

## 2018-10-21 LAB — VITAMIN D 25 HYDROXY (VIT D DEFICIENCY, FRACTURES): Vit D, 25-Hydroxy: 18.4 ng/mL — ABNORMAL LOW (ref 30.0–100.0)

## 2018-10-21 MED ORDER — GADOBUTROL 1 MMOL/ML IV SOLN
8.0000 mL | Freq: Once | INTRAVENOUS | Status: AC | PRN
  Administered 2018-10-21: 18:00:00 8 mL via INTRAVENOUS

## 2018-10-21 MED ORDER — IOHEXOL 300 MG/ML  SOLN
100.0000 mL | Freq: Once | INTRAMUSCULAR | Status: AC | PRN
  Administered 2018-10-21: 100 mL via INTRAVENOUS

## 2018-10-21 NOTE — Progress Notes (Signed)
  Subjective:  Patient seen at bedside this morning on rounds. Patient is sitting up at the side of the bed eating breakfast. She says the food doesn't taste very good. Breathing is doing better today than prior days. Was able to get some sleep last night. Pt says she spent a couple hours with her granddaughter yesterday. We discussed doing a bone density scan to evaluate for other cancerous lesions.  [facility]. We discussed setting up a PCP appointment when she leaves the hospital. The patient says she understands.     Objective:    Vital Signs (last 24 hours): Vitals:   10/21/18 0010 10/21/18 0342 10/21/18 0348 10/21/18 0817  BP: (!) 115/52 (!) 113/50  (!) 130/59  Pulse:    69  Resp: 20 19  (!) 27  Temp: 98.8 F (37.1 C) 99 F (37.2 C)  98 F (36.7 C)  TempSrc: Oral Oral  Oral  SpO2: 96% 96%  98%  Weight:   86.9 kg   Height:        Physical Exam: General Alert and answers questions appropriately, no acute distress.  Cardiac Regular rate and rhythm, no murmurs, rubs, or gallops  Pulmonary Mildly decreased breath sounds on the left side, no rales/rhonchi  Extremities No peripheral edema    Assessment/Plan:   Principal Problem:   Acute CHF (congestive heart failure) (HCC) Active Problems:   Shortness of breath   Mass of breast, right   CKD (chronic kidney disease) stage 3, GFR 30-59 ml/min (HCC)   Malignant pleural effusion   Pneumothorax   Gram-positive bacteremia   Metastatic breast cancer [facility])  Patient is a 78 year old female with a past medical history notable for hypertension, anxiety who presented initially with a 3-week history of progressive worsening shortness of breath.  She was found to have a large right-sided fungating breast mass for which biopsy completed with results pending.  In addition, there was a large left pleural effusion for which a thoracentesis was performed and revealed exudative fluid with cytology positive for  adenocarcinoma.  She has been evaluated by oncology, Dr. [name], and by general surgery.  She will follow-up with oncology at discharge once clinically stable.  # Fungating right breast mass: # Shortness of breath with exudative pleural effusion: Adenocarcinoma on cytology of the pleural effusion.  Awaiting skin biopsy results.  She has been evaluated by oncology who recommends bone scan, CT abdomen/pelvis, MRI brain for staging - we will order CT abdomen/pelvis, MR brain as inpatient, plan for bone scan as outpatient.  Patient continues to be mildly symptomatic from effusion, but patient shortness of breath is improved today.  Will have a high threshold to repeat thoracentesis given her expansion pneumothorax from prior thoracentesis. * Follow-up skin biopsy pathology * CT abdomen/pelvis W/WO, MR brain W/WO as inpatient, bone scan as outpatient  # Atrial fibrillation with RVR: Patient with episodes of atrial fibrillation with RVR 2 days ago, no subsequent episodes.  Patient has had intermittent episodes of increased heart rate but the most recent appears to be sinus tachycardia versus AVNRT.  We will continue to monitor on telemetry during her admission.  She will likely need to consider long-term anticoagulation therapy but currently on therapeutic Lovenox.   Diet: Regular DVT Ppx: Lovenox Dispo: Anticipated discharge pending clinical improvement and further diagnostic workup  Earlene Plater, MD 10/21/2018, 8:50 AM Pager: [phone]

## 2018-10-21 NOTE — Telephone Encounter (Signed)
A hospital follow up appt has been scheduled for the pt to see Dr. [name] on 10/5 at 11am. Pt is currently in the hospital. Letter mailed.

## 2018-10-21 NOTE — Progress Notes (Signed)
Physical Therapy Treatment Patient Details Name: Beverly Tucker MRN: [id number] DOB: 1940-03-14 Today's Date: 10/21/2018    History of Present Illness 78yo female with 3 week history of progressive SOB; had been treated by PCP for allergies and pnuemonia, covid negative. Also reports B LE edema and lesion under R breast. CT following thoracentesis positive for small PTX, negative for PE. s/p R breast biopsy. Admitted for acute CHF. PMH HTN, anxiety Patient found to have metastatic breast CA, malignant pneumothorax, adenocarinoma.    PT Comments    Patient received in recliner. No supplemental O2 donned. Sats at 89% on room air. Patient agrees to PT session. Walked 15 feet on room air, sats dropped to 85%. Patient given supplemental O2 for the rest of gait. Patient ambulated 125 feet without AD, min guard. Slight fatigue with 2 brief standing rest breaks. Mild unsteadiness due to weakness. Patient will benefit from continued skilled PT while here to improve strength and activity tolerance.     Follow Up Recommendations  Home health PT     Equipment Recommendations  Rolling walker with 5" wheels    Recommendations for Other Services       Precautions / Restrictions Precautions Precautions: Fall Precaution Comments: watch heart rate/rhythm, SpO2 Restrictions Weight Bearing Restrictions: No    Mobility  Bed Mobility               General bed mobility comments: up in recliner  Transfers Overall transfer level: Needs assistance Equipment used: None Transfers: Sit to/from Stand Sit to Stand: Supervision         General transfer comment: patient reaching out for furniture in room with initial gait. After leaving room patient is able to walk without UE assistance, min guard.  Ambulation/Gait Ambulation/Gait assistance: Min guard Gait Distance (Feet): 125 Feet Assistive device: None Gait Pattern/deviations: Step-through pattern;Decreased stride length Gait velocity:  decreased   General Gait Details: mild unsteadiness, one small LOB in hallway, able to self correct. Min guard for ambulation in hallway   Stairs             Wheelchair Mobility    Modified Rankin (Stroke Patients Only)       Balance Overall balance assessment: Mild deficits observed, not formally tested                                          Cognition Arousal/Alertness: Awake/alert Behavior During Therapy: WFL for tasks assessed/performed Overall Cognitive Status: Within Functional Limits for tasks assessed                                        Exercises      General Comments General comments (skin integrity, edema, etc.): pt on 2lnc throughout session, SpO2 >96% throughout session      Pertinent Vitals/Pain Pain Assessment: No/denies pain    Home Living                      Prior Function            PT Goals (current goals can now be found in the care plan section) Acute Rehab PT Goals Patient Stated Goal: go home PT Goal Formulation: With patient Time For Goal Achievement: 11/01/18 Potential to Achieve Goals: Good Progress towards PT goals: Progressing  toward goals    Frequency    Min 3X/week      PT Plan Current plan remains appropriate    Co-evaluation              AM-PAC PT "6 Clicks" Mobility   Outcome Measure  Help needed turning from your back to your side while in a flat bed without using bedrails?: None Help needed moving from lying on your back to sitting on the side of a flat bed without using bedrails?: None Help needed moving to and from a bed to a chair (including a wheelchair)?: A Little Help needed standing up from a chair using your arms (e.g., wheelchair or bedside chair)?: A Little Help needed to walk in hospital room?: A Little Help needed climbing 3-5 steps with a railing? : A Little 6 Click Score: 20    End of Session Equipment Utilized During Treatment: Gait  belt;Oxygen Activity Tolerance: Patient tolerated treatment well;Patient limited by fatigue Patient left: in chair;with call bell/phone within reach Nurse Communication: Mobility status PT Visit Diagnosis: Unsteadiness on feet (R26.81);Muscle weakness (generalized) (M62.81)     Time: 1415-1430 PT Time Calculation (min) (ACUTE ONLY): 15 min  Charges:  $Gait Training: 8-22 mins                     Shanta Dorvil, PT, GCS 10/21/18,2:42 PM

## 2018-10-21 NOTE — Progress Notes (Signed)
Occupational Therapy Treatment Patient Details Name: Beverly Tucker MRN: [id number] DOB: 07-Dec-1940 Today's Date: 10/21/2018    History of present illness 78yo female with 3 week history of progressive SOB; had been treated by PCP for allergies and pnuemonia, covid negative. Also reports B LE edema and lesion under R breast. CT following thoracentesis positive for small PTX, negative for PE. s/p R breast biopsy. Admitted for acute CHF. PMH HTN, anxiety   OT comments  Pt continues to progress toward established OT goals. Pt currently requires minguard for functional mobility with use of RW to increase stability as pt demonstrated preference for at least single UE support and furniture walking. Pt educated on energy conservation strategies during ADL completion with provided handout. Pt will continue to benefit from skilled OT services to maximize safety and independence with ADL/IADL and functional mobility. Will continue to follow acutely and progress as tolerated.    Follow Up Recommendations  No OT follow up;Supervision - Intermittent    Equipment Recommendations  Tub/shower seat    Recommendations for Other Services      Precautions / Restrictions Precautions Precautions: Fall;Other (comment) Precaution Comments: watch heart rate/rhythm, SpO2       Mobility Bed Mobility               General bed mobility comments: pt sitting in recliner upon arrival  Transfers Overall transfer level: Needs assistance Equipment used: Rolling walker (2 wheeled) Transfers: Sit to/from Stand Sit to Stand: Supervision         General transfer comment: provided RW to increase stability as pt was reliant on furniture walking and demonstrated preference for at least 1UE support     Balance Overall balance assessment: Mild deficits observed, not formally tested                                         ADL either performed or assessed with clinical judgement   ADL  Overall ADL's : Needs assistance/impaired     Grooming: Brushing hair;Standing;Supervision/safety                               Functional mobility during ADLs: Supervision/safety General ADL Comments: educated pt on energy conservation strategies with provided handout;     Vision       Perception     Praxis      Cognition Arousal/Alertness: Awake/alert Behavior During Therapy: WFL for tasks assessed/performed Overall Cognitive Status: Within Functional Limits for tasks assessed                                          Exercises     Shoulder Instructions       General Comments pt on 2lnc throughout session, SpO2 >96% throughout session    Pertinent Vitals/ Pain       Pain Assessment: No/denies pain  Home Living                                          Prior Functioning/Environment              Frequency  Min 2X/week  Progress Toward Goals  OT Goals(current goals can now be found in the care plan section)  Progress towards OT goals: Progressing toward goals  Acute Rehab OT Goals Patient Stated Goal: go home OT Goal Formulation: With patient Time For Goal Achievement: 11/02/18 Potential to Achieve Goals: Good ADL Goals Pt Will Perform Grooming: with supervision;with modified independence;standing Pt Will Perform Lower Body Bathing: with modified independence Pt Will Perform Lower Body Dressing: with modified independence Pt Will Transfer to Toilet: with modified independence;Independently;ambulating Additional ADL Goal #1: Pt will verbalize and demo 3 energy conservation technqiues for and during ADLs/ADL mobility  Plan Discharge plan remains appropriate    Co-evaluation                 AM-PAC OT "6 Clicks" Daily Activity     Outcome Measure   Help from another person eating meals?: None Help from another person taking care of personal grooming?: None Help from another person  toileting, which includes using toliet, bedpan, or urinal?: A Little Help from another person bathing (including washing, rinsing, drying)?: A Little Help from another person to put on and taking off regular upper body clothing?: None Help from another person to put on and taking off regular lower body clothing?: A Little 6 Click Score: 21    End of Session Equipment Utilized During Treatment: Gait belt;Rolling walker;Oxygen(2lnc)  OT Visit Diagnosis: Unsteadiness on feet (R26.81);Muscle weakness (generalized) (M62.81)   Activity Tolerance Patient tolerated treatment well   Patient Left in chair;with call bell/phone within reach   Nurse Communication Mobility status        Time: [id number] OT Time Calculation (min): 13 min  Charges: OT General Charges $OT Visit: 1 Visit OT Treatments $Self Care/Home Management : 8-22 mins  Dorinda Hill OTR/L [place] Office: [place] 10/21/2018, 1:25 PM

## 2018-10-21 NOTE — Progress Notes (Signed)
acknowledge order to remove suture, however biospy site with significant bleeding during dressing change and unable to remove suture at this time.  Will try tomorrow.

## 2018-10-21 NOTE — Progress Notes (Signed)
SATURATION QUALIFICATIONS: (This note is used to comply with regulatory documentation for home oxygen)  Patient Saturations on Room Air at Rest = 89%  Patient Saturations on Room Air while Ambulating = 85%  Patient Saturations on 2 Liters of oxygen while Ambulating = 92%  Please briefly explain why patient needs home oxygen: Patient has oxygen desaturation when not on supplemental O2.   Garland Smouse, PT, GCS 10/21/18,2:32 PM

## 2018-10-22 ENCOUNTER — Inpatient Hospital Stay (HOSPITAL_COMMUNITY): Payer: Medicare HMO

## 2018-10-22 ENCOUNTER — Encounter (HOSPITAL_COMMUNITY): Payer: Self-pay | Admitting: Radiology

## 2018-10-22 HISTORY — PX: IR THORACENTESIS ASP PLEURAL SPACE W/IMG GUIDE: IMG5380

## 2018-10-22 LAB — CBC
HCT: 34.4 % — ABNORMAL LOW (ref 36.0–46.0)
Hemoglobin: 10.5 g/dL — ABNORMAL LOW (ref 12.0–15.0)
MCH: 28.8 pg (ref 26.0–34.0)
MCHC: 30.5 g/dL (ref 30.0–36.0)
MCV: 94.5 fL (ref 80.0–100.0)
Platelets: 291 10*3/uL (ref 150–400)
RBC: 3.64 MIL/uL — ABNORMAL LOW (ref 3.87–5.11)
RDW: 14.5 % (ref 11.5–15.5)
WBC: 10.9 10*3/uL — ABNORMAL HIGH (ref 4.0–10.5)
nRBC: 0 % (ref 0.0–0.2)

## 2018-10-22 LAB — GLUCOSE, PLEURAL OR PERITONEAL FLUID: Glucose, Fluid: 110 mg/dL

## 2018-10-22 LAB — PROTEIN, PLEURAL OR PERITONEAL FLUID: Total protein, fluid: 3.4 g/dL

## 2018-10-22 LAB — BODY FLUID CELL COUNT WITH DIFFERENTIAL
Eos, Fluid: 10 %
Lymphs, Fluid: 68 %
Monocyte-Macrophage-Serous Fluid: 20 % — ABNORMAL LOW (ref 50–90)
Neutrophil Count, Fluid: 2 % (ref 0–25)
Total Nucleated Cell Count, Fluid: 2375 cu mm — ABNORMAL HIGH (ref 0–1000)

## 2018-10-22 LAB — CANCER ANTIGEN 27.29: CA 27.29: 15.6 U/mL (ref 0.0–38.6)

## 2018-10-22 LAB — GRAM STAIN

## 2018-10-22 LAB — LACTATE DEHYDROGENASE, PLEURAL OR PERITONEAL FLUID: LD, Fluid: 189 U/L — ABNORMAL HIGH (ref 3–23)

## 2018-10-22 LAB — ALBUMIN, PLEURAL OR PERITONEAL FLUID: Albumin, Fluid: 1.9 g/dL

## 2018-10-22 MED ORDER — LIDOCAINE HCL 1 % IJ SOLN
INTRAMUSCULAR | Status: AC
  Filled 2018-10-22: qty 20

## 2018-10-22 MED ORDER — LIDOCAINE HCL 1 % IJ SOLN
INTRAMUSCULAR | Status: DC | PRN
  Administered 2018-10-22: 10 mL

## 2018-10-22 NOTE — TOC Progression Note (Signed)
Transition of Care (TOC) - Progression Note  Marvetta Gibbons RN, BSN Transitions of Care Unit 4E- RN Case Manager [phone]   Patient Details  Name: Beverly Tucker MRN: [id number] Date of Birth: [birth year]  Transition of Care [facility]) CM/SW Contact  Dahlia Client, Romeo Rabon, RN Phone Number: 10/22/2018, 3:55 PM  Clinical Narrative:    Pt s/p thoracentesis today, plan to return home with [facility]- order placed for HHPT, spoke with pt at bedside- list provided Per CMS guidelines from medicare.gov website with star ratings (copy placed in shadow chart)- pt requesting time to review list, CM will f/u in am for choice. Discussed DME needs with pt, per pt she has a friend who is going to let her use a RW- declines need for RW at discharge- she however is requesting DME-3n1- will ask MD for order- also discussed possible need for home 02- pt will need a qualifying note within 48 hr of discharge- explained home 02 coverage under Medicare- will have pt assessed for qualification. Pt understands if she does not qualify she may have to pay out of pocket of she still requires it for discharge. Pt is hopeful she will only need it short term if at all. CM will f/u on home 02 needs prior to transition home.    Expected Discharge Plan: [place] Barriers to Discharge: Continued Medical Work up  Expected Discharge Plan and Services Expected Discharge Plan: [place] Choice: Home Health, Durable Medical Equipment Living arrangements for the past 2 months: Single Family Home                 DME Arranged: 3-N-1, Oxygen DME Agency: [employer]       HH Arranged: PT           Social Determinants of Health (SDOH) Interventions    Readmission Risk Interventions No flowsheet data found.

## 2018-10-22 NOTE — Care Management Important Message (Signed)
Important Message  Patient Details  Name: Beverly Tucker MRN: [id number] Date of Birth: 12-28-1940   Medicare Important Message Given:  Yes     Shelda Altes 10/22/2018, 2:48 PM

## 2018-10-22 NOTE — Progress Notes (Signed)
[facility] Progress Note     Subjective: CC: fungating breast CA Notified patient that pathology resulted as breast cancer, as expected. Explained that further discussions regarding specific markers and what that means for treatment options will be discussed further by oncology. No further bleeding from mass, dressing was changed this AM.   Objective: Vital signs in last 24 hours: Temp:  [97.8 F (36.6 C)-98.9 F (37.2 C)] 98.2 F (36.8 C) (09/22 0750) Pulse Rate:  [65-93] 72 (09/22 0750) Resp:  [17-28] 18 (09/22 0750) BP: (107-141)/(40-52) 119/49 (09/22 0750) SpO2:  [92 %-100 %] 100 % (09/22 0750) Last BM Date: 10/21/18  Intake/Output from previous day: 09/21 0701 - 09/22 0700 In: 200 [P.O.:200] Out: -  Intake/Output this shift: No intake/output data recorded.  PE: Gen:  Alert, NAD, pleasant Card:  Regular rate and rhythm Pulm:  Normal effort Chest: R breast mass without bleeding or purulence, suture removed from biopsy site  Lab Results:  Recent Labs    10/20/18 0303 10/22/18 0258  WBC 8.7 10.9*  HGB 10.4* 10.5*  HCT 33.1* 34.4*  PLT 243 291   BMET Recent Labs    10/20/18 0303  NA 137  K 3.5  CL 97*  CO2 30  GLUCOSE 114*  BUN 10  CREATININE 0.96  CALCIUM 8.3*   PT/INR No results for input(s): LABPROT, INR in the last 72 hours. CMP     Component Value Date/Time   NA 137 10/20/2018 0303   K 3.5 10/20/2018 0303   CL 97 (L) 10/20/2018 0303   CO2 30 10/20/2018 0303   GLUCOSE 114 (H) 10/20/2018 0303   BUN 10 10/20/2018 0303   CREATININE 0.96 10/20/2018 0303   CALCIUM 8.3 (L) 10/20/2018 0303   PROT 6.4 (L) 10/15/2018 0130   PROT 6.2 (L) 10/15/2018 0130   ALBUMIN 3.0 (L) 10/15/2018 0130   AST 19 10/15/2018 0130   ALT 16 10/15/2018 0130   ALKPHOS 96 10/15/2018 0130   BILITOT 0.7 10/15/2018 0130   GFRNONAA 56 (L) 10/20/2018 0303   GFRAA >60 10/20/2018 0303   Lipase  No results found for: LIPASE     Studies/Results: Dg Eye  Foreign Body  Result Date: 10/21/2018 CLINICAL DATA:  Previous unspecified eye surgery; clearance prior to MRI EXAM: ORBITS FOR FOREIGN BODY - 2 VIEW COMPARISON:  None. FINDINGS: There is no evidence of metallic foreign body within the orbits. No significant bone abnormality identified. IMPRESSION: No evidence of metallic foreign body within the orbits. Electronically Signed   By: Logan Bores M.D.   On: 10/21/2018 16:34   Mr Jeri Cos [id number] Contrast  Result Date: 10/21/2018 CLINICAL DATA:  Breast cancer staging. EXAM: MRI HEAD WITHOUT AND WITH CONTRAST TECHNIQUE: Multiplanar, multiecho pulse sequences of the brain and surrounding structures were obtained without and with intravenous contrast. CONTRAST:  38mL GADAVIST GADOBUTROL 1 MMOL/ML IV SOLN COMPARISON:  None. FINDINGS: Brain: Mild cerebral atrophy. Negative for hydrocephalus. Negative for acute or chronic infarct. Negative for hemorrhage, mass, or edema. Normal enhancement postcontrast infusion.  No enhancing mass lesion. Vascular: Normal arterial flow voids Skull and upper cervical spine: Negative Sinuses/Orbits: Negative Other: None IMPRESSION: Negative MRI brain with contrast.  Negative for metastatic disease. Electronically Signed   By: Franchot Gallo M.D.   On: 10/21/2018 17:47   Ct Abdomen Pelvis W Contrast  Result Date: 10/22/2018 CLINICAL DATA:  History of breast cancer, breast cancer staging with suspicion for metastatic disease. EXAM: CT ABDOMEN AND PELVIS WITH CONTRAST TECHNIQUE: Multidetector  CT imaging of the abdomen and pelvis was performed using the standard protocol following bolus administration of intravenous contrast. CONTRAST:  122mL OMNIPAQUE IOHEXOL 300 MG/ML  SOLN COMPARISON:  None. FINDINGS: Lower chest: Opacification of the left inferior chest due to large left-sided pleural effusion with basilar collapse, nodularity in the costodiaphragmatic sulcus best seen on sagittal images the left chest. Variable enhancement of collapsed lung  tissue is incompletely imaged. Basilar atelectasis noted on the right with nodular opacity at the right lung base. Heart is incompletely imaged, no visualized pericardial fluid or nodularity. Hepatobiliary: Subtle low-attenuation in the medial section of the left hepatic lobe (hepatic subsegment IV B) best seen on image 20, series 3 measuring approximately 1.5 x 1.4 cm. Post cholecystectomy without signs of biliary ductal dilation. Pancreas: Unremarkable. No pancreatic ductal dilatation or surrounding inflammatory changes. Spleen: No signs of focal splenic lesion. Adrenals/Urinary Tract: Adrenal glands are normal. No signs of hydronephrosis. Symmetric renal enhancement. Cyst arises from the lower pole the left kidney. Stomach/Bowel: Stomach is normal. Small bowel with normal caliber, no signs of acute small bowel process. A normal appendix. Moderate size periumbilical ventral hernia contains the midportion of the transverse colon, no signs of obstruction at this time. Moderate sigmoid diverticulosis with mild fascial thickening along the posterior aspect of sigmoid colon in the sigmoid mesocolon. Vascular/Lymphatic: Moderate to marked calcific atherosclerosis of the abdominal aorta extending into the iliac vessels. No signs of aneurysm in the abdomen. No retroperitoneal adenopathy. Scattered lymph nodes are present but without pathologic size. No signs of pelvic lymphadenopathy. Reproductive: Thickening of the endometrium beyond what is expected for this 78 year old patient measuring nearly 2 cm as estimated by CT. Other: Urinary bladder is decompressed. Hernia as discussed above. No signs of acute process in the body wall with areas of gas in the subcutaneous fat likely related to prior injection. Bowing of levator ani musculature with descent of anterior middle and posterior compartment (bladder base, uterus-vagina and rectum) visceral contents. (Findings are indicative of pelvic floor dysfunction and should be  correlated clinically. Musculoskeletal: Mixed lytic and sclerotic process in the right ischium measures 1.8 x 1.6 cm (image 74, series 3). Mixed lytic and sclerotic changes in the right eighth and ninth ribs with subacute fractures at the lateral margin of these ribs. Cortical destruction the medial cortex of the right tenth rib. Question of increased enhancement within the central canal along the distal aspect of the spinal cord and filum terminalis. Spinal degenerative changes. Small sclerotic focus at the L1 level best seen on sagittal images. Grade 1 anterolisthesis of L5 on S1 with pars defects at this level the left. Vertebral hemangioma at L3. IMPRESSION: 1. Large malignant left-sided effusion. Trace effusion on the right with basilar atelectasis. 2. Variation in parenchymal enhancement raising the question of hilar lesion in the left chest, consider CT chest if not performed for further evaluation. 3. 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 4. Endometrial thickening may represent a second primary, consider endovaginal sonogram for further assessment as warranted. 5. Question of added enhancement in these central canal along the spinal cord internal nerve roots. This could be seen in the setting of CNS involvement but is not certain. MRI of the spine for additional staging could be performed as warranted. 6. Area of vague hypoattenuation anterior and inferior to portal structures in medial section left hepatic lobe likely an area of fatty infiltration, attention  on follow-up or assess with MR for definitive characterization. Electronically Signed   By: Zetta Bills M.D.   On: 10/22/2018 08:26    Anti-infectives: Anti-infectives (From admission, onward)   Start     Dose/Rate Route Frequency Ordered Stop   10/17/18 2000  vancomycin (VANCOCIN) IVPB 1000 mg/200 mL premix  Status:  Discontinued      1,000 mg 200 mL/hr over 60 Minutes Intravenous Every 24 hours 10/16/18 1938 10/17/18 1029   10/17/18 2000  vancomycin (VANCOCIN) IVPB 750 mg/150 ml premix  Status:  Discontinued     750 mg 150 mL/hr over 60 Minutes Intravenous Every 24 hours 10/17/18 1029 10/18/18 1057   10/16/18 2000  vancomycin (VANCOCIN) 1,500 mg in sodium chloride 0.9 % 500 mL IVPB     1,500 mg 250 mL/hr over 120 Minutes Intravenous  Once 10/16/18 1938 10/16/18 2253   10/16/18 1745  Ampicillin-Sulbactam (UNASYN) 3 g in sodium chloride 0.9 % 100 mL IVPB  Status:  Discontinued     3 g 200 mL/hr over 30 Minutes Intravenous Every 8 hours 10/16/18 1742 10/17/18 0742   10/14/18 1630  piperacillin-tazobactam (ZOSYN) IVPB 3.375 g  Status:  Discontinued     3.375 g 100 mL/hr over 30 Minutes Intravenous  Once 10/14/18 1618 10/14/18 1747   10/14/18 1615  vancomycin (VANCOCIN) 1,750 mg in sodium chloride 0.9 % 500 mL IVPB  Status:  Discontinued     1,750 mg 250 mL/hr over 120 Minutes Intravenous  Once 10/14/18 1610 10/14/18 1747   10/14/18 1615  piperacillin-tazobactam (ZOSYN) IVPB 4.5 g  Status:  Discontinued     4.5 g 200 mL/hr over 30 Minutes Intravenous  Once 10/14/18 1610 10/14/18 1618       Assessment/Plan CHF HTN CKD Bacteremia - off abx Atrial fibrillation with RVR  Fungating R breast mass S/p bedside biopsy 9/16 - pathology shows metastatic breast CA, ER, cytokeratin 7 and GATA-3 positive - continue dressing changes, suture removed from biopsy site - further recommendations per oncology - if port desired we could place either prior to discharge or she could follow up as an outpatient   FEN: reg diet VTE: lovenox ID: no current abx  LOS: 8 days    Brigid Re , [facility] 10/22/2018, 9:59 AM Pager: [phone] Consults: [phone] 7:00 AM - 4:30 PM M, W-F 7:00 AM - 11:30 AM Tues, Sat, Sun

## 2018-10-22 NOTE — Progress Notes (Addendum)
°  Subjective:  Patient seen at bedside this morning.  Patient says she is feeling well, with continued mild shortness of breath.  Patient reports difficulty during imaging yesterday as the table is very uncomfortable.  Patient informed that the imaging studies showed evidence for cancer in other parts of her body.  Patient informed of plan of care, all questions answered.   Objective:    Vital Signs (last 24 hours): Vitals:   10/21/18 2300 10/22/18 0014 10/22/18 0455 10/22/18 0750  BP:  (!) 107/40 (!) 141/46 (!) 119/49  Pulse:  65 73 72  Resp: 18 20 (!) 28 18  Temp:  97.8 F (36.6 C) 98.3 F (36.8 C) 98.2 F (36.8 C)  TempSrc:  Oral Oral Oral  SpO2:  98% 94% 100%  Weight:      Height:        Physical Exam: General Alert and answers questions appropriately, no acute distress.  Cardiac Regular rate and rhythm, no murmurs, rubs, or gallops  Pulmonary Mildly decreased breath sounds on the left side, no rales/rhonchi  Neurology 5/5 strength in proximal and distal bilateral lower extremities  Extremities No peripheral edema    Assessment/Plan:   Principal Problem:   Acute CHF (congestive heart failure) (HCC) Active Problems:   Shortness of breath   Mass of breast, right   CKD (chronic kidney disease) stage 3, GFR 30-59 ml/min (HCC)   Malignant pleural effusion   Pneumothorax   Gram-positive bacteremia   Metastatic breast cancer [facility])  Patient is a 78 year old female with a past medical history notable for hypertension, anxiety who presented initially with a 3-week history of progressive worsening shortness of breath.  She was found to have a large right-sided fungating breast mass for which biopsy completed and results showing breast carcinoma.  In addition, there was a large left pleural effusion for which a thoracentesis performed revealed exudative fluid cytology positive for adenocarcinoma.  Repeat imaging shows reaccumulation of left-sided pleural fluid.  Plan for IR  thoracentesis today.  # Fungating right breast mass: # Shortness of breath with exudative pleural effusion: Adenocarcinoma on cytology of the pleural effusion.  Skin biopsy results showing breast carcinoma.  MRI brain negative for metastatic disease.  CT of abdomen/pelvis shows signs of bony metastatic disease, concern for enhancement in the central canal along the distal aspect of the spinal cord and filum terminalis, hypoattenuation in the liver which may represent fatty infiltration, endometrial thickening possibly representing a second primary disease.  Repeat chest x-ray this morning shows prominent left-sided pleural effusion, progressed from prior exam. * Thoracentesis per IR today * Considering further imaging for evaluation of the increased enhancement within central canal.  Patient is with 5 out of 5 strength in proximal and distal lower extremities, able to ambulate, without symptoms of cord compression. * Bone scan as outpatient to complete staging workup  # Atrial fibrillation with RVR: Patient with episodes of atrial fibrillation with RVR 2 days ago, no subsequent episodes.  Patient has had intermittent episodes of increased heart rate but the most recent appears to be sinus tachycardia versus AVNRT.  We will continue to monitor on telemetry during her admission.  She will likely need to consider long-term anticoagulation therapy but currently on therapeutic Lovenox.   Diet: Regular DVT Ppx: Lovenox Dispo: Anticipated discharge in 1-2 days  Jeanmarie Hubert, MD 10/22/2018, 12:37 PM Pager: [phone]

## 2018-10-22 NOTE — Procedures (Signed)
Ultrasound-guided diagnostic and therapeutic left thoracentesis performed yielding 820 cc of hazy, amber fluid. No immediate complications. Follow-up chest x-ray pending. A portion of the fluid was sent to the lab for preordered studies. EBL< 1 cc. Due to pt coughing only the above amount of fluid was removed today.

## 2018-10-23 LAB — CBC
HCT: 33.5 % — ABNORMAL LOW (ref 36.0–46.0)
Hemoglobin: 10.6 g/dL — ABNORMAL LOW (ref 12.0–15.0)
MCH: 29.5 pg (ref 26.0–34.0)
MCHC: 31.6 g/dL (ref 30.0–36.0)
MCV: 93.3 fL (ref 80.0–100.0)
Platelets: 291 10*3/uL (ref 150–400)
RBC: 3.59 MIL/uL — ABNORMAL LOW (ref 3.87–5.11)
RDW: 14.6 % (ref 11.5–15.5)
WBC: 11 10*3/uL — ABNORMAL HIGH (ref 4.0–10.5)
nRBC: 0 % (ref 0.0–0.2)

## 2018-10-23 LAB — PATHOLOGIST SMEAR REVIEW

## 2018-10-23 LAB — SURGICAL PATHOLOGY

## 2018-10-23 MED ORDER — METOPROLOL TARTRATE 25 MG PO TABS: 25.0000 mg | ORAL_TABLET | Freq: Two times a day (BID) | ORAL | 1 refills | Status: DC

## 2018-10-23 MED ORDER — APIXABAN 5 MG PO TABS: 5.0000 mg | ORAL_TABLET | Freq: Two times a day (BID) | ORAL | 1 refills | Status: AC

## 2018-10-23 NOTE — Discharge Instructions (Signed)

## 2018-10-23 NOTE — Progress Notes (Signed)
HEMATOLOGY-ONCOLOGY PROGRESS NOTE  SUBJECTIVE: Feels well today.  Planning to discharge home in the next few hours.  Has decided to hold off on getting the MRI of her back until she is discharged.  Denies chest pain or shortness of breath.  No abdominal pain, nausea, vomiting.  REVIEW OF SYSTEMS:   Constitutional: Denies fevers, chills or abnormal weight loss Eyes: Denies blurriness of vision Ears, nose, mouth, throat, and face: Denies mucositis or sore throat Respiratory: Denies cough, dyspnea or wheezes Cardiovascular: Denies palpitation, chest discomfort Gastrointestinal:  Denies nausea, heartburn or change in bowel habits Skin: Denies abnormal skin rashes Lymphatics: Denies new lymphadenopathy or easy bruising Neurological:Denies numbness, tingling or new weaknesses Behavioral/Psych: Mood is stable, no new changes  Extremities: No lower extremity edema All other systems were reviewed with the patient and are negative.  I have reviewed the past medical history, past surgical history, social history and family history with the patient and they are unchanged from previous note.   PHYSICAL EXAMINATION: ECOG PERFORMANCE STATUS: 2 - Symptomatic, <50% confined to bed  Vitals:   10/23/18 0749 10/23/18 1134  BP: (!) 117/49 (!) 99/48  Pulse: 74 65  Resp: (!) 21 (!) 27  Temp: 98.5 F (36.9 C) 98.4 F (36.9 C)  SpO2: 97% 100%   Filed Weights   10/20/18 0457 10/21/18 0348 10/23/18 0500  Weight: 188 lb 4.4 oz (85.4 kg) 191 lb 8 oz (86.9 kg) 192 lb 1.6 oz (87.1 kg)    Intake/Output from previous day: 09/22 0701 - 09/23 0700 In: 78 [P.O.:440] Out: -   GENERAL:alert, no distress and comfortable SKIN: skin color, texture, turgor are normal, no rashes or significant lesions EYES: normal, Conjunctiva are pink and non-injected, sclera clear OROPHARYNX:no exudate, no erythema and lips, buccal mucosa, and tongue normal  NECK: supple, thyroid normal size, non-tender, without  nodularity LYMPH:  no palpable lymphadenopathy in the cervical, axillary or inguinal LUNGS: clear to auscultation and percussion with normal breathing effort HEART: regular rate & rhythm and no murmurs and no lower extremity edema ABDOMEN:abdomen soft, non-tender and normal bowel sounds Musculoskeletal:no cyanosis of digits and no clubbing  NEURO: alert & oriented x 3 with fluent speech, no focal motor/sensory deficits  LABORATORY DATA:  I have reviewed the data as listed CMP Latest Ref Rng & Units 10/20/2018 10/19/2018 10/18/2018  Glucose 70 - 99 mg/dL 114(H) 94 112(H)  BUN 8 - 23 mg/dL '10 12 11  ' Creatinine 0.44 - 1.00 mg/dL 0.96 0.98 1.01(H)  Sodium 135 - 145 mmol/L 137 136 137  Potassium 3.5 - 5.1 mmol/L 3.5 3.7 3.9  Chloride 98 - 111 mmol/L 97(L) 96(L) 98  CO2 22 - 32 mmol/L '30 26 29  ' Calcium 8.9 - 10.3 mg/dL 8.3(L) 8.3(L) 8.3(L)  Total Protein 6.5 - 8.1 g/dL - - -  Total Bilirubin 0.3 - 1.2 mg/dL - - -  Alkaline Phos 38 - 126 U/L - - -  AST 15 - 41 U/L - - -  ALT 0 - 44 U/L - - -    Lab Results  Component Value Date   WBC 11.0 (H) 10/23/2018   HGB 10.6 (L) 10/23/2018   HCT 33.5 (L) 10/23/2018   MCV 93.3 10/23/2018   PLT 291 10/23/2018   NEUTROABS 7.7 10/14/2018    Dg Eye Foreign Body  Result Date: 10/21/2018 CLINICAL DATA:  Previous unspecified eye surgery; clearance prior to MRI EXAM: ORBITS FOR FOREIGN BODY - 2 VIEW COMPARISON:  None. FINDINGS: There is no evidence  of metallic foreign body within the orbits. No significant bone abnormality identified. IMPRESSION: No evidence of metallic foreign body within the orbits. Electronically Signed   By: Logan Bores M.D.   On: 10/21/2018 16:34   Dg Chest 1 View  Result Date: 10/22/2018 CLINICAL DATA:  Status post left thoracentesis. EXAM: CHEST  1 VIEW COMPARISON:  Radiograph of same day. FINDINGS: Stable cardiomegaly. No pneumothorax is noted. Left pleural effusion is significantly smaller status post thoracentesis. Mild right  basilar atelectasis is noted with probable small right pleural effusion. Bony thorax is unremarkable. IMPRESSION: Left pleural effusion is significantly smaller status post thoracentesis. No pneumothorax is noted. Electronically Signed   By: Marijo Conception M.D.   On: 10/22/2018 12:57   Dg Abd 1 View  Result Date: 10/16/2018 CLINICAL DATA:  Increased SOB as well as nausea and emesis. Hx of CHF, HTN, GERD. EXAM: ABDOMEN - 1 VIEW COMPARISON:  None. FINDINGS: The bowel gas pattern is normal. No radio-opaque calculi or other significant radiographic abnormality are seen. IMPRESSION: Negative. Electronically Signed   By: Nolon Nations M.D.   On: 10/16/2018 18:36   Ct Chest Wo Contrast  Result Date: 10/14/2018 CLINICAL DATA:  78 year old female with past medical history of hypertension who presents with a 3-week history of progressive worsening of shortness of breath. Patient reports that symptoms started with a runny nose and cough productive of clear white mucus. Patient saw outpatient provider, was COVID negative, was started on allergy medication. Shortness of breath continue to progress, saw provider on 9/12, was diagnosed with pneumonia, and started on Keflex. At that visit, patient was also started on doxycycline for cellulitis (under right breast). EXAM: CT CHEST WITHOUT CONTRAST TECHNIQUE: Multidetector CT imaging of the chest was performed following the standard protocol without IV contrast. COMPARISON:  Current chest radiograph. FINDINGS: Cardiovascular: Mild enlargement of the heart. No pericardial effusion. Three-vessel coronary artery calcifications. Great vessels normal in caliber. There are aortic atherosclerotic calcifications. Mediastinum/Nodes: No neck base masses or enlarged lymph nodes. No mediastinal or hilar masses or adenopathy. Trachea and esophagus are unremarkable. Lungs/Pleura: Moderate-sized left and small right pleural effusions. Dependent atelectasis in the lower lobes, left  greater than right. Mild interstitial thickening most evident the lower lobes. Linear opacities are noted in the left upper lobe posteriorly and in the lingula consistent with atelectasis. There are a few small irregular nodules, largest subpleural, right lower lobe, image 81, series 4, 6 mm. Mild scarring at the apices. No pneumothorax. Upper Abdomen: No acute findings. Status post cholecystectomy. Aortic atherosclerosis. Musculoskeletal: No fracture or acute finding. No osteoblastic or osteolytic lesions. Chest wall: There is masslike soft tissue that is contiguous with the skin medial aspect of the right breast, measuring approximately 5.7 x 3.5 x 4.9 cm. There is more generalized breast skin thickening bilaterally. Mildly prominent bilateral axillary lymph nodes are noted, none pathologically enlarged by size criteria. IMPRESSION: 1. No convincing pneumonia. 2. Moderate left and small right pleural effusions. There is mild bilateral interstitial thickening most evident in the lower lungs. This suggests mild interstitial edema. 3. Dependent atelectasis in the lower lobes, left greater than right, with milder atelectasis in the left upper lobe. 4. Small lung nodules most evident in the right middle lobe. 5. Abnormal soft tissue contiguous with the skin of the medial right breast. Although this may be infectious/inflammatory, neoplastic disease should be considered. Consider follow-up diagnostic mammography and possible right breast ultrasound for further assessment. Aortic Atherosclerosis (ICD10-I70.0). Electronically Signed   By:  Lajean Manes M.D.   On: 10/14/2018 20:05   Ct Angio Chest Pe W Or Wo Contrast  Result Date: 10/16/2018 CLINICAL DATA:  Urgent patient brought down for PE due to SOB RN stated she had thoracentesis and breast bx and is now SOB and in a EXAM: CT ANGIOGRAPHY CHEST WITH CONTRAST TECHNIQUE: Multidetector CT imaging of the chest was performed using the standard protocol during bolus  administration of intravenous contrast. Multiplanar CT image reconstructions and MIPs were obtained to evaluate the vascular anatomy. CONTRAST:  46m OMNIPAQUE IOHEXOL 350 MG/ML SOLN COMPARISON:  Chest x-ray on 10/16/2018 FINDINGS: Cardiovascular: Heart size is mildly enlarged. There is atherosclerotic calcification of the coronary arteries. Calcification of the mitral annulus. Trace pericardial effusion. The pulmonary arteries are well opacified by contrast bolus and there is no evidence for acute pulmonary embolus. Note is made of atherosclerotic calcification of the thoracic aorta not associated with aneurysm. Mediastinum/Nodes: The visualized portion of the thyroid gland has a normal appearance. Esophagus is normal/in appearance. There are 3 level I RIGHT axillary lymph nodes with abnormal morphology, there small axillary lymph nodes bilaterally, measuring up to 1 centimeter in diameter. No mediastinal or hilar adenopathy. Lungs/Pleura: There is a small anterior LEFT pneumothorax, estimated to be 5-10% of lung volume. Persistent LEFT pleural effusion and associated atelectasis. Trace RIGHT pleural effusion and basilar atelectasis. Upper Abdomen: No acute abnormality. Musculoskeletal: Thoracic kyphosis. No suspicious lytic or blastic lesions are identified. Soft tissues: Irregularity, skin thickening, and asymmetry within the inferior and MEDIAL portion of the RIGHT breast, suspicious for malignancy. Small subcutaneous nodule is identified below the RIGHT breast, measuring 8 millimeters on image 110/5 Review of the MIP images confirms the above findings. IMPRESSION: 1. Technically adequate exam showing no acute pulmonary embolus. 2. Small LEFT pneumothorax, estimated to be 5-10% of lung volume. 3. Small to moderate LEFT pleural effusion and associated atelectasis. 4. Trace RIGHT pleural effusion and basilar atelectasis. 5. Cardiomegaly and coronary artery disease. 6. RIGHT breast skin thickening and irregularity  suspicious for malignancy. Recommend diagnostic mammography and ultrasound when the patient is able. 7. Small bilateral axillary lymph nodes, 1 centimeter in diameter. 8. Aortic Atherosclerosis (ICD10-I70.0). These results were called by telephone at the time of interpretation on 10/16/2018 at 7:52 pm to provider Dr. CMarianna Payment Who verbally acknowledged these results. Electronically Signed   By: ENolon NationsM.D.   On: 10/16/2018 19:53   Mr BJeri CosWAVContrast  Result Date: 10/21/2018 CLINICAL DATA:  Breast cancer staging. EXAM: MRI HEAD WITHOUT AND WITH CONTRAST TECHNIQUE: Multiplanar, multiecho pulse sequences of the brain and surrounding structures were obtained without and with intravenous contrast. CONTRAST:  868mGADAVIST GADOBUTROL 1 MMOL/ML IV SOLN COMPARISON:  None. FINDINGS: Brain: Mild cerebral atrophy. Negative for hydrocephalus. Negative for acute or chronic infarct. Negative for hemorrhage, mass, or edema. Normal enhancement postcontrast infusion.  No enhancing mass lesion. Vascular: Normal arterial flow voids Skull and upper cervical spine: Negative Sinuses/Orbits: Negative Other: None IMPRESSION: Negative MRI brain with contrast.  Negative for metastatic disease. Electronically Signed   By: ChFranchot Gallo.D.   On: 10/21/2018 17:47   Ct Abdomen Pelvis W Contrast  Result Date: 10/22/2018 CLINICAL DATA:  History of breast cancer, breast cancer staging with suspicion for metastatic disease. EXAM: CT ABDOMEN AND PELVIS WITH CONTRAST TECHNIQUE: Multidetector CT imaging of the abdomen and pelvis was performed using the standard protocol following bolus administration of intravenous contrast. CONTRAST:  10076mMNIPAQUE IOHEXOL 300 MG/ML  SOLN COMPARISON:  None. FINDINGS: Lower chest: Opacification of the left inferior chest due to large left-sided pleural effusion with basilar collapse, nodularity in the costodiaphragmatic sulcus best seen on sagittal images the left chest. Variable enhancement of  collapsed lung tissue is incompletely imaged. Basilar atelectasis noted on the right with nodular opacity at the right lung base. Heart is incompletely imaged, no visualized pericardial fluid or nodularity. Hepatobiliary: Subtle low-attenuation in the medial section of the left hepatic lobe (hepatic subsegment IV B) best seen on image 20, series 3 measuring approximately 1.5 x 1.4 cm. Post cholecystectomy without signs of biliary ductal dilation. Pancreas: Unremarkable. No pancreatic ductal dilatation or surrounding inflammatory changes. Spleen: No signs of focal splenic lesion. Adrenals/Urinary Tract: Adrenal glands are normal. No signs of hydronephrosis. Symmetric renal enhancement. Cyst arises from the lower pole the left kidney. Stomach/Bowel: Stomach is normal. Small bowel with normal caliber, no signs of acute small bowel process. A normal appendix. Moderate size periumbilical ventral hernia contains the midportion of the transverse colon, no signs of obstruction at this time. Moderate sigmoid diverticulosis with mild fascial thickening along the posterior aspect of sigmoid colon in the sigmoid mesocolon. Vascular/Lymphatic: Moderate to marked calcific atherosclerosis of the abdominal aorta extending into the iliac vessels. No signs of aneurysm in the abdomen. No retroperitoneal adenopathy. Scattered lymph nodes are present but without pathologic size. No signs of pelvic lymphadenopathy. Reproductive: Thickening of the endometrium beyond what is expected for this 78 year old patient measuring nearly 2 cm as estimated by CT. Other: Urinary bladder is decompressed. Hernia as discussed above. No signs of acute process in the body wall with areas of gas in the subcutaneous fat likely related to prior injection. Bowing of levator ani musculature with descent of anterior middle and posterior compartment (bladder base, uterus-vagina and rectum) visceral contents. (Findings are indicative of pelvic floor dysfunction  and should be correlated clinically. Musculoskeletal: Mixed lytic and sclerotic process in the right ischium measures 1.8 x 1.6 cm (image 74, series 3). Mixed lytic and sclerotic changes in the right eighth and ninth ribs with subacute fractures at the lateral margin of these ribs. Cortical destruction the medial cortex of the right tenth rib. Question of increased enhancement within the central canal along the distal aspect of the spinal cord and filum terminalis. Spinal degenerative changes. Small sclerotic focus at the L1 level best seen on sagittal images. Grade 1 anterolisthesis of L5 on S1 with pars defects at this level the left. Vertebral hemangioma at L3. IMPRESSION: 1. Large malignant left-sided effusion. Trace effusion on the right with basilar atelectasis. 2. Variation in parenchymal enhancement raising the question of hilar lesion in the left chest, consider CT chest if not performed for further evaluation. 3. 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 4. Endometrial thickening may represent a second primary, consider endovaginal sonogram for further assessment as warranted. 5. Question of added enhancement in these central canal along the spinal cord internal nerve roots. This could be seen in the setting of CNS involvement but is not certain. MRI of the spine for additional staging could be performed as warranted. 6. Area of vague hypoattenuation anterior and inferior to portal structures in medial section left hepatic lobe likely an area of fatty infiltration, attention on follow-up or assess with MR for definitive characterization. Electronically Signed   By: Zetta Bills M.D.   On: 10/22/2018 08:26   Dg Chest [facility] 1 [address]  Result Date: 10/22/2018 CLINICAL DATA:  Pleural effusion. EXAM: PORTABLE CHEST 1 VIEW COMPARISON:  10/16/2018. FINDINGS: Stable cardiomegaly. No pulmonary venous  congestion. Prominent left-sided pleural effusion, progressed from prior exam. Underlying left lower lung atelectasis/infiltrate most likely present. Atelectatic changes right lung base also again noted. IMPRESSION: 1.  Stable cardiomegaly.  No pulmonary venous congestion. 2. Prominent left-sided pleural effusion, progressed from prior exam. Underlying left lower lobe atelectasis/infiltrate most likely present. Atelectatic changes right lung base also again noted. Electronically Signed   By: Marcello Moores  Register   On: 10/22/2018 10:03   Dg Chest Port 1 View  Result Date: 10/16/2018 CLINICAL DATA:  Increased SOB as well as nausea and emesis. Hx of CHF, HTN, GERD. EXAM: PORTABLE CHEST 1 VIEW COMPARISON:  10/16/2018 FINDINGS: The heart is enlarged and stable in configuration. LEFT pleural effusion and LEFT basilar opacity are stable. Atelectasis or early infiltrate at the RIGHT lung base. No pulmonary edema. IMPRESSION: Stable appearance of the chest. Electronically Signed   By: Nolon Nations M.D.   On: 10/16/2018 18:32   Dg Chest Port 1 View  Result Date: 10/16/2018 CLINICAL DATA:  Productive cough, shortness of breath EXAM: PORTABLE CHEST 1 VIEW COMPARISON:  10/15/2018 FINDINGS: Slight interval increase in left-sided pleural effusion with associated atelectasis or consolidation. There is increased, subtle heterogeneous opacity at the right lung base. Cardiomegaly. IMPRESSION: Slight interval increase in left-sided pleural effusion with associated atelectasis or consolidation. There is increased, subtle heterogeneous opacity at the right lung base. Findings remain consistent with infection or aspiration Electronically Signed   By: Eddie Candle M.D.   On: 10/16/2018 15:13   Dg Chest Port 1 View  Result Date: 10/15/2018 CLINICAL DATA:  Follow-up LEFT pneumothorax following thoracentesis. EXAM: PORTABLE CHEST 1 VIEW COMPARISON:  Film earlier this day FINDINGS: The very small LEFT pneumothorax is no longer  identified. Mild bibasilar atelectasis again noted. Cardiomegaly is present. No other changes noted. IMPRESSION: No visualized pneumothorax. Mild basilar atelectasis. Electronically Signed   By: Margarette Canada M.D.   On: 10/15/2018 19:11   Dg Chest Portable 1 View  Result Date: 10/15/2018 CLINICAL DATA:  S/p LEFT thoracentesis. EXAM: PORTABLE CHEST 1 VIEW COMPARISON:  10/14/2018 chest radiograph and CT FINDINGS: Near-complete resolution of visualized LEFT pleural effusion with very small apical and basilar pneumothorax. Bibasilar atelectasis again noted. Cardiomegaly again noted. IMPRESSION: Very small LEFT apical and basilar pneumothorax following LEFT thoracentesis. Near complete resolution of visualized LEFT pleural effusion. Electronically Signed   By: Margarette Canada M.D.   On: 10/15/2018 15:28   Dg Chest Port 1 View  Result Date: 10/14/2018 CLINICAL DATA:  Shortness of breath EXAM: PORTABLE CHEST 1 VIEW COMPARISON:  Chest radiograph dated 10/05/2018. FINDINGS: The heart appears enlarged accounting for technique. There is a small a moderate left pleural effusion with associated atelectasis/airspace disease. There is mild right basilar atelectasis/airspace disease. There is no pneumothorax. The visualized skeletal structures are unremarkable. IMPRESSION: Small to moderate left pleural effusion with associated atelectasis/airspace disease. Mild right basilar atelectasis/airspace disease. Cardiomegaly. Electronically Signed   By: Zerita Boers M.D.   On: 10/14/2018 14:38   Dg Chest Portable 1 View  Result Date: 10/05/2018 CLINICAL DATA:  Shortness of breath for 2 weeks. EXAM: PORTABLE CHEST 1 VIEW COMPARISON:  None. FINDINGS: External pacing wires overlie the thorax. Cardiomediastinal silhouette is normal. Mediastinal contours appear intact. Left lower lobe airspace consolidation versus atelectasis. Osseous structures are without acute abnormality. Soft tissues are grossly normal. IMPRESSION: Left lower lobe  airspace  consolidation versus atelectasis. Electronically Signed   By: Fidela Salisbury M.D.   On: 10/05/2018 18:08   Ir Thoracentesis Asp Pleural Space W/img Guide  Result Date: 10/22/2018 INDICATION: Patient with history of CHF, chronic kidney disease, metastatic breast cancer, dyspnea, recurrent left pleural effusion. Request made for diagnostic and therapeutic left thoracentesis. EXAM: ULTRASOUND GUIDED DIAGNOSTIC AND THERAPEUTIC LEFT THORACENTESIS MEDICATIONS: None COMPLICATIONS: None immediate. PROCEDURE: An ultrasound guided thoracentesis was thoroughly discussed with the patient and questions answered. The benefits, risks, alternatives and complications were also discussed. The patient understands and wishes to proceed with the procedure. Written consent was obtained. Ultrasound was performed to localize and mark an adequate pocket of fluid in the left chest. The area was then prepped and draped in the normal sterile fashion. 1% Lidocaine was used for local anesthesia. Under ultrasound guidance a 6 Fr Safe-T-Centesis catheter was introduced. Thoracentesis was performed. The catheter was removed and a dressing applied. FINDINGS: A total of approximately 820 cc of hazy, amber fluid was removed. Samples were sent to the laboratory as requested by the clinical team. IMPRESSION: Successful ultrasound guided diagnostic and therapeutic left thoracentesis yielding 820 cc of pleural fluid. Read by: Rowe Robert, PA-C Electronically Signed   By: Sandi Mariscal M.D.   On: 10/22/2018 12:25    ASSESSMENT AND PLAN: 78 year old female with  #1 Fungating right breast mass This was biopsied on 9/16 by Dr. Lucia Gaskins at bedside. Pathology results consistent with metastatic carcinoma, ER positive at 90%, PR positive at 90%, HER-2/neu negative, Ki-67 50% CA 15-3 normal at 14.8 and CA 27.29 normal at 15.6 on 10/19/2018 MRI of the brain performed on 10/21/2018- for metastatic disease CT of the abdomen and pelvis  performed on 10/21/2018 showed a large malignant left-sided effusion and trace effusion on the right, questionable left hilar lesion in the chest, signs of bony metastatic disease involving the right ischium, right sided ribs with potential pathologic rib fracture of the right eighth and ninth ribs, endometrial thickening could represent a second primary -consider endovaginal sonogram for further assessment, question of an enhancement in the central canal along the spinal cord interval nerve roots concerning for CNS involvement but not certain-MRI of the spine recommended.  #2 moderate left and small right-sided pleural effusion with shortness of breath. 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  IHC not done to determine primary site. Pneumothorax status post thoracentesis  #3 Congestive heart failure #4 Atrial fibrillation with RVR on therapeutic anticoagulation #5 CKD #6 hypertension #7 coag negative staph on blood cultures thought to be contaminant  PLAN -Appreciate excellent hospitalist care for this patient's medical concerns. -Discussed biopsy results and results of tumor marker.  Will discuss further treatment plan as an outpatient. -Patient was scheduled to have an MRI of the spine but she is opted not to do this as an inpatient.  We will need to follow-up on this as an outpatient. -We will need to consider endovaginal sonogram for further evaluation of the endometrial thickening as an outpatient. -We discussed that her presentation of the malignancy is metastatic and though the treatment is not curative there can be quite significant and potentially durable response. -The patient plans to discharged home later today.  She has outpatient follow-up with Dr. [name] scheduled for 11/04/2018 at the [facility] to discuss treatment options.    LOS: 9 days   Mikey Bussing, DNP, AGPCNP-BC, AOCNP 10/23/18

## 2018-10-23 NOTE — Progress Notes (Signed)
°  Subjective:  Pt seen at the bedside on rounds today. The patient feels well overall and ready to go home. She would like to hold off on getting the MRI on her back for now and get it in the outpatient setting. We discussed the benefits and risks of long term anticoagulation for her afib and she would like to be on anticoagulation. Patient says she had a bowel movement yesterday. She was up and walking yesterday with the therapist as well.   Objective:   Vital Signs (last 24 hours): Vitals:   10/23/18 0043 10/23/18 0444 10/23/18 0500 10/23/18 0749  BP: (!) 104/57 (!) 92/58  (!) 117/49  Pulse: 65 62 63 74  Resp: (!) 27 (!) 21 (!) 23 (!) 21  Temp: 99.2 F (37.3 C) 99.1 F (37.3 C)  98.5 F (36.9 C)  TempSrc: Oral Oral  Oral  SpO2: 99% 99% 99% 97%  Weight:   87.1 kg   Height:       Physical Exam: General Alert and answers questions appropriately, no acute distress.  Cardiac Regular rate and rhythm, no murmurs, rubs, or gallops  Pulmonary Mildly decreased breath sounds on the left side, no rales/rhonchi  Abdominal Nontender, nondistended, bowel sounds present    Assessment/Plan:   Principal Problem:   Acute CHF (congestive heart failure) (HCC) Active Problems:   Shortness of breath   Mass of breast, right   CKD (chronic kidney disease) stage 3, GFR 30-59 ml/min (HCC)   Malignant pleural effusion   Pneumothorax   Gram-positive bacteremia   Metastatic breast cancer [facility])  Patient is a 78 year old female with past medical history notable for hypertension, anxiety presented initially with 3 week history of progressive worsening shortness of breath.  She was found to have a large left-sided fungating breast mass for which biopsy completed and results showing breast carcinoma.  In addition, there was a large left pleural effusion for which thoracentesis performed revealed exudative fluid cytology positive for adenocarcinoma.  Patient had repeat thoracentesis yesterday of 800 ml  pleural fluid.  # Fungating right breast mass: # Shortness of breath with exudative pleural effusion: Adenocarcinoma on cytology of the pleural effusion.  Skin biopsy results showing breast carcinoma.  MRI brain negative for metastatic disease.  CT of abdomen/pelvis shows signs of bony metastatic disease, concern for enhancement in the central canal along the distal aspect of the spinal cord and filum terminalis, hypoattenuation in the liver which may represent fatty infiltration, endometrial thickening possibly representing a second primary disease.  Patient had repeat thoracentesis yesterday of 800 ml fluid, analysis showing exudative effusion. * Talked with patient about further imaging for the increase enhancement within the central canal.  Patient had discomfort with MR and does not want this procedure.  Discussed risks of delaying evaluation.  Patient understands and will follow-up outpatient for further evaluation * We will need bone scan as outpatient to complete staging work-up  # Atrial fibrillation with RVR: Patient with episodes of atrial fibrillation with RVR during admission. In normal sinus rhythm this morning.  * Patient has been on therapeutic Lovenox, will start on long-term anticoagulation with Eliquis.  We discussed the risks and benefits of Eliquis with patient and son in detail.  They agree with anticoagulant therapy. * Continue metoprolol 25 mg BID  Dispo: Anticipated discharge today  Earlene Plater, MD 10/23/2018, 9:30 AM Pager: [phone]

## 2018-10-23 NOTE — TOC Transition Note (Addendum)
Transition of Care [facility]) - CM/SW Discharge Note Marvetta Gibbons RN, BSN Transitions of Care Unit 4E- RN Case Manager [phone]   Patient Details  Name: Beverly Tucker MRN: [id number] Date of Birth: February 16, 1940  Transition of Care [facility]) CM/SW Contact:  Dawayne Patricia, RN Phone Number: 10/23/2018, 11:27 AM   Clinical Narrative:    Pt stable for transition home today, per rounding team plan to d/c later today. F/u done this am with pt for [facility] agency choice- per pt she would like to use [facility]- Call made to [facility] with [name] for HHPT needs- referral has been accepted. Call also made to [facility] with [employer] for Home 02 and 3n1 needs- DME to be delivered to room prior to discharge along with portable 02 tank for transport home. Benefits check submitted for Eliquis. 30 day free card provided for Eliquis [facility] pt to [facility] Per pt her PCP is Kathryne Eriksson, address, phone # confirmed in epic  Final next level of care: [place] Barriers to Discharge: No Barriers Identified, Barriers Resolved   Patient Goals and CMS Choice Patient states their goals for this hospitalization and ongoing recovery are:: to return to home CMS Medicare.gov Compare Post Acute Care list provided to:: Patient Choice offered to / list presented to : Patient  Discharge Placement        Home with [facility]               Discharge Plan and Services     Post Acute Care Choice: Home Health, Durable Medical Equipment          DME Arranged: 3-N-1, Oxygen DME Agency: [facility] Date DME Agency Contacted: 10/23/18 Time DME Agency Contacted: [id number] Representative spoke with at DME Agency: [place]: PT [place]: [place] Date [place]: 10/23/18 Time [place]: 1126 Representative spoke with at [place]: [place] ([place]) Interventions     Readmission Risk Interventions Readmission Risk Prevention Plan 10/23/2018  Transportation  Screening Complete  PCP or Specialist Appt within 5-7 Days Complete  Home Care Screening Complete  Medication Review (RN CM) Complete  Some recent data might be hidden

## 2018-10-23 NOTE — TOC Benefit Eligibility Note (Signed)
Transition of Care [facility]) Benefit Eligibility Note    Patient Details  Name: Beverly Tucker MRN: [id number] Date of Birth: December 18, 1940   Medication/Dose: Eliquis 5mg  bid  Covered?: Yes        Spoke with Person/Company/Phone Number:: [facility] [id number]  Co-Pay: $45  Prior Approval: No          [place] Phone Number: 10/23/2018, 1:53 PM

## 2018-10-23 NOTE — Progress Notes (Signed)
Physical Therapy Treatment Patient Details Name: Beverly Tucker MRN: [id number] DOB: 03/12/1940 Today's Date: 10/23/2018    History of Present Illness 78yo female with 3 week history of progressive SOB; had been treated by PCP for allergies and pnuemonia, covid negative. Also reports B LE edema and lesion under R breast. CT following thoracentesis positive for small PTX, negative for PE. s/p R breast biopsy. Admitted for acute CHF. PMH HTN, anxiety Patient found to have metastatic breast CA, malignant pneumothorax, adenocarinoma.    PT Comments    Pt progressing well. Pt with increased stability with RW and feels more safe. RW also assists in energy conservation to allow for increased ambulation distance. Continue to recommend HHPT and initial 24/7 until patient is indep with O2 line management. Acute PT to cont to follow.  SATURATION QUALIFICATIONS: (This note is used to comply with regulatory documentation for home oxygen)  Patient Saturations on Room Air at Rest = 90%  Patient Saturations on Room Air while Ambulating = 81%  Patient Saturations on 2 Liters of oxygen while Ambulating = 91%  Please briefly explain why patient needs home oxygen:   Follow Up Recommendations  Home health PT;Supervision/Assistance - 24 hour(24/7 initially)     Equipment Recommendations  Rolling walker with 5" wheels    Recommendations for Other Services       Precautions / Restrictions Precautions Precautions: Fall Restrictions Weight Bearing Restrictions: No    Mobility  Bed Mobility               General bed mobility comments: up in recliner upon PT arrival  Transfers Overall transfer level: Needs assistance Equipment used: None Transfers: Sit to/from Stand Sit to Stand: Supervision         General transfer comment: increased time but no physical assist  Ambulation/Gait Ambulation/Gait assistance: Min guard Gait Distance (Feet): 200 Feet Assistive device: Rolling walker (2  wheeled) Gait Pattern/deviations: Step-through pattern;Decreased stride length Gait velocity: slow Gait velocity interpretation: 1.31 - 2.62 ft/sec, indicative of limited community ambulator General Gait Details: initially amb without AD however short, shuffled steps, wide base of support and reaching for objects to hold onto. Pt then given RW, pt with noted increased stability and increased step length and fluidity in gait pattern. Pt SpO2 dec into low 80s on RA, on 2Lo2 via [place] pt SpO2 >90%   Stairs             Wheelchair Mobility    Modified Rankin (Stroke Patients Only)       Balance Overall balance assessment: Mild deficits observed, not formally tested                                          Cognition Arousal/Alertness: Awake/alert Behavior During Therapy: WFL for tasks assessed/performed Overall Cognitive Status: Within Functional Limits for tasks assessed                                        Exercises      General Comments General comments (skin integrity, edema, etc.): see SpO2 sats in ambulation section      Pertinent Vitals/Pain Pain Assessment: No/denies pain    Home Living  Prior Function            PT Goals (current goals can now be found in the care plan section) Acute Rehab PT Goals Patient Stated Goal: go home Progress towards PT goals: Progressing toward goals    Frequency    Min 3X/week      PT Plan Current plan remains appropriate    Co-evaluation              AM-PAC PT "6 Clicks" Mobility   Outcome Measure  Help needed turning from your back to your side while in a flat bed without using bedrails?: None Help needed moving from lying on your back to sitting on the side of a flat bed without using bedrails?: None Help needed moving to and from a bed to a chair (including a wheelchair)?: A Little Help needed standing up from a chair using your arms (e.g.,  wheelchair or bedside chair)?: A Little Help needed to walk in hospital room?: A Little Help needed climbing 3-5 steps with a railing? : A Little 6 Click Score: 20    End of Session Equipment Utilized During Treatment: Gait belt;Oxygen Activity Tolerance: Patient tolerated treatment well;Patient limited by fatigue Patient left: in chair;with call bell/phone within reach Nurse Communication: Mobility status PT Visit Diagnosis: Unsteadiness on feet (R26.81);Muscle weakness (generalized) (M62.81)     Time: [id number] PT Time Calculation (min) (ACUTE ONLY): 18 min  Charges:  $Gait Training: 8-22 mins                     Kittie Plater, PT, DPT Acute Rehabilitation Services Pager #: [phone] Office #: [phone]    [place]line Lopes 10/23/2018, 1:25 PM

## 2018-10-24 ENCOUNTER — Telehealth: Payer: Self-pay | Admitting: *Deleted

## 2018-10-24 NOTE — Discharge Summary (Signed)
Name: Beverly Tucker MRN: [id number] DOB: [birth year] 78 y.o. PCP: Patient, No Pcp Per  Date of Admission: 10/14/2018  1:54 PM Date of Discharge: 10/23/2018 Attending Physician: Dr. Dareen Piano Discharge Diagnosis: 1. Breast cancer 2. Shortness of breath with exudative pleural effusion 3. Atrial fibrillation with RVR  Discharge Medications: Allergies as of 10/23/2018   No Known Allergies     Medication List    STOP taking these medications   cephALEXin 500 MG capsule Commonly known as: KEFLEX   doxycycline 100 MG capsule Commonly known as: VIBRAMYCIN     TAKE these medications   ALPRAZolam 0.5 MG tablet Commonly known as: XANAX Take 0.25-0.5 mg by mouth 2 (two) times daily as needed for anxiety.   apixaban 5 MG Tabs tablet Commonly known as: ELIQUIS Take 1 tablet (5 mg total) by mouth 2 (two) times daily.   lisinopril-hydrochlorothiazide 10-12.5 MG tablet Commonly known as: ZESTORETIC Take 1 tablet by mouth daily.   metoprolol tartrate 25 MG tablet Commonly known as: LOPRESSOR Take 1 tablet (25 mg total) by mouth 2 (two) times daily. What changed:   medication strength  how much to take       Disposition and follow-up:   Beverly Tucker was discharged from [facility] in [place] condition.  At the hospital follow up visit please address:  1.  Please assess patient for shortness of breath and consider repeat chest x-ray if worsening shortness of breath. Patient required thoracentesis twice during admission for left-sided pleural effusion and may have re-accumulation of fluid. If patient has new weakness/changes in sensation, please consider the possible spinal cord lesion seen on CT. Patient had previously declined MR as inpatient but this may need further workup with MR.  2.  Labs / imaging needed at time of follow-up: Consider Chest Radiograph, EKG, MR spine  3.  Pending labs/ test needing follow-up: None  Follow-up Appointments: Follow-up  Information    [name], MD. Schedule an appointment as soon as possible for a visit.   Specialty: Family Medicine Contact information: [address] [place] [address] [place] [address] [phone]        Brunetta Genera, MD Follow up on 11/04/2018.   Specialties: Hematology, Oncology Why: Go to your appointment with Dr. [name] on Monday October 5th at 11 am Contact information: [place] [address] [phone]        [facility], [facility] Follow up.   Specialty: [place] Why: HHPT arranged- they will call you to set up home visits Contact information: [place] [address] [phone]        [facility] Follow up.   Why: home 02 and 3n1 arranged- portable tank and 3n1 to be delivered to room prior to discharge Contact information: [place] [address] [phone]           Hospital Course by problem list:  # Breast cancer: On physical exam, patient had a 5 x 7 cm nodular fungating mass on the inferior portion of the right breast (see picture below).  Biopsy of breast lesion was consistent with breast carcinoma.    Imaging studies for breast cancer staging - CT chest demonstrated few small irregular nodules, CT abdomen/pelvis demonstrated signs of bony metastatic disease involving the right ischium and right-sided ribs and also demonstrated endometrial thickening which could represent a second primary cancer.  CT abdomen/pelvis additionally revealed an area of enhancement in  the central canal along the distal aspect of the spinal cord.  Patient was without cranial nerve deficit or focal change in strength/sensation to suggest a compressive spinal cord lesion.  Patient was encouraged to undergo an MRI for further evaluation of a potential spinal cord lesion.  Patient declined this study due to discomfort with prior MRI and wished to instead consider this imaging study as an  outpatient.  Patient was evaluated by Dr. [name] with oncology and was provided outpatient appointment for follow-up and initiation of treatment.  # Shortness of breath: # Pneumothorax: # Exudative pleural effusion:  Patient presented with a 3-week history of progressive worsening shortness of breath and on chest x-ray was found to have a left-sided pleural effusion.  Patient had normal white count, no fever or malaise to suggest pneumonia.  Bedside thoracentesis was performed on 9/15 and drained 900 mL of serosanguineous pleural fluid with fluid analysis consistent with exudative effusion and cytology revealing adenocarcinoma.  Follow-up chest x-ray demonstrated small left apical and basal pneumothorax and patient was treated with nitrogen washout and pneumothorax resolved on repeat radiograph.  A CT angiogram performed on 9/16 did show a small 5-10% left pneumothorax thought to represent pneumothorax ex vacuo. Repeat chest x-ray on 9/22 showed reaccumulation of left-sided pleural effusion.  Repeat thoracentesis was performed by IR with drainage of 820 cc of hazy, amber fluid.  No pneumothorax was appreciated on follow-up chest x-ray.  There was initially concern for heart failure given patient's shortness of breath and bilateral 2+ pitting edema up to mid shins.  Patient was diuresed with 40 mg IV Lasix daily for 2 days with improvement in lower extremity edema.  TTE was performed which showed an ejection fraction of greater than 65% and was without evidence for significant diastolic dysfunction.  # Atrial fibrillation with RVR: On 9/16, patient became short of breath and was found to be in atrial fibrillation with RVR.  Patient was given metoprolol and started on diltiazem drip.  Patient returned to normal sinus rhythm the following morning, diltiazem drip discontinued.  There was an episode of AVNRT versus sinus tachycardia, but no additional episodes of atrial fibrillation.  Patient was placed on  therapeutic Lovenox as inpatient. Patient was discharged on metoprolol 25 mg twice daily, as well as apixaban 5 mg twice daily.  Before discharge, we discussed the risks versus benefits of anticoagulant therapy and patient was in favor of proceeding with anticoagulant therapy.      Discharge Vitals:   BP (!) 99/48 (BP Location: Left Arm)   Pulse 65   Temp 98.4 F (36.9 C) (Oral)   Resp (!) 27   Ht 5\' 6"  (1.676 m)   Wt 87.1 kg   SpO2 100%   BMI 31.01 kg/m   Pertinent Labs, Studies, and Procedures:  CBC Latest Ref Rng & Units 10/23/2018 10/22/2018 10/20/2018  WBC 4.0 - 10.5 K/uL 11.0(H) 10.9(H) 8.7  Hemoglobin 12.0 - 15.0 g/dL 10.6(L) 10.5(L) 10.4(L)  Hematocrit 36.0 - 46.0 % 33.5(L) 34.4(L) 33.1(L)  Platelets 150 - 400 K/uL 291 291 243   BMP Latest Ref Rng & Units 10/20/2018 10/19/2018 10/18/2018  Glucose 70 - 99 mg/dL 114(H) 94 112(H)  BUN 8 - 23 mg/dL 10 12 11   Creatinine 0.44 - 1.00 mg/dL 0.96 0.98 1.01(H)  Sodium 135 - 145 mmol/L 137 136 137  Potassium 3.5 - 5.1 mmol/L 3.5 3.7 3.9  Chloride 98 - 111 mmol/L 97(L) 96(L) 98  CO2 22 - 32 mmol/L 30 26 29  Calcium 8.9 - 10.3 mg/dL 8.3(L) 8.3(L) 8.3(L)   CT Chest (10/14/2018): IMPRESSION: 1. No convincing pneumonia. 2. Moderate left and small right pleural effusions. There is mild bilateral interstitial thickening most evident in the lower lungs. This suggests mild interstitial edema. 3. Dependent atelectasis in the lower lobes, left greater than right, with milder atelectasis in the left upper lobe. 4. Small lung nodules most evident in the right middle lobe. 5. Abnormal soft tissue contiguous with the skin of the medial right breast. Although this may be infectious/inflammatory, neoplastic disease should be considered. Consider follow-up diagnostic mammography and possible right breast ultrasound for further Assessment.  CT Angio Chest PE W or WO Contrast (10/16/2018): IMPRESSION: 1. Technically adequate exam showing no  acute pulmonary embolus. 2. Small LEFT pneumothorax, estimated to be 5-10% of lung volume. 3. Small to moderate LEFT pleural effusion and associated atelectasis. 4. Trace RIGHT pleural effusion and basilar atelectasis. 5. Cardiomegaly and coronary artery disease. 6. RIGHT breast skin thickening and irregularity suspicious for malignancy. Recommend diagnostic mammography and ultrasound when the patient is able. 7. Small bilateral axillary lymph nodes, 1 centimeter in diameter. 8. Aortic Atherosclerosis (ICD10-I70.0).  MR Brain W WO Contrast (10/21/2018): IMPRESSION: Negative MRI brain with contrast.  Negative for metastatic disease.  CT Abdomen/Pelvis W Contrast (10/22/2018): IMPRESSION: 1. Large malignant left-sided effusion. Trace effusion on the right with basilar atelectasis. 2. Variation in parenchymal enhancement raising the question of hilar lesion in the left chest, consider CT chest if not performed for further evaluation. 3. 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 4. Endometrial thickening may represent a second primary, consider endovaginal sonogram for further assessment as warranted. 5. Question of added enhancement in these central canal along the spinal cord internal nerve roots. This could be seen in the setting of CNS involvement but is not certain. MRI of the spine for additional staging could be performed as warranted. 6. Area of vague hypoattenuation anterior and inferior to portal structures in medial section left hepatic lobe likely an area of fatty infiltration, attention on follow-up or assess with MR for definitive characterization.  DG Chest 1 View (10/22/2018): IMPRESSION: Left pleural effusion is significantly smaller status post thoracentesis. No pneumothorax is noted.   Discharge Instructions: Discharge Instructions    Call MD for:   difficulty breathing, headache or visual disturbances   Complete by: As directed    Call MD for:  extreme fatigue   Complete by: As directed    Call MD for:  persistant dizziness or light-headedness   Complete by: As directed    Call MD for:  temperature >100.4   Complete by: As directed    Diet - low sodium heart healthy   Complete by: As directed    Discharge instructions   Complete by: As directed    You were seen in the hospital for shortness of breath. During your evaluation, we found that you have breast cancer. You were seen by Dr. [name], the oncologist, and have a followup appointment with him. You were also found to have an abnormal heart beat called atrial fibrillation. We have started you on an anticoagulant called apixaban to thin your blood and help prevent a clot. If you notice severe fatigue, weakness, signs of bleeding please call your doctor or return to the emergency room. We have also continued you on metoprolol to help treat your abnormal heart rhythm. Please take all your  medications as prescribed.   Thank you for allowing [place] to be part of your medical care   Increase activity slowly   Complete by: As directed       Signed: Jeanmarie Hubert, MD 10/26/2018, 8:22 AM   Pager: [phone]

## 2018-10-24 NOTE — Telephone Encounter (Signed)
Attempted to contact patient per Dr. [name] - offer appt today, 9/24 at 3:20 PM. Current appt is on 10/5. Left voice mail and requested patient contact office.

## 2018-10-27 LAB — CULTURE, BODY FLUID W GRAM STAIN -BOTTLE: Culture: NO GROWTH

## 2018-11-03 NOTE — Progress Notes (Signed)
HEMATOLOGY/ONCOLOGY CONSULTATION NOTE  Date of Service: 11/03/2018  Patient Care Team: Patient, No Pcp Per as PCP - General (General Practice)  CHIEF COMPLAINTS/PURPOSE OF CONSULTATION:  Breast Cancer  HISTORY OF PRESENTING ILLNESS:  Beverly Tucker is a wonderful 78 y.o. female with past medical history of hypertension who presents with a 3-week history of progressive worsening of shortness of breath.  Patient reports that symptoms started with a runny nose and cough productive of clear white mucus.  Patient saw outpatient provider, was COVID negative, was started on allergy medication.  Shortness of breath continue to progress, saw provider on 9/12, was diagnosed with pneumonia, and started on Keflex.  At that visit, patient was also started on doxycycline for cellulitis (under right breast).  Patient reports taking both medications consistently every day.  Patient reports that shortness of breath is worse with exertion, worse when lying flat, reports sleeping with 2 pillows, denies PND symptoms.  Patient denies fever, chills, muscle aches, weight loss/gain, abdominal pain, urinary symptoms, diarrhea.  Reports 1 episode of nonbilious, nonbloody vomit last Saturday.  Patient reports lower extremity swelling over the past 2 days.  States last colonoscopy was 3 to 4 years ago, not sure when she is supposed to follow-up again.  Patient reports never having a mammogram.  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 Patient reports lesion under right breast which is non-tender and has been developing for the past couple of months. She has been taking doxycycline as this was thought to be cellulitis.  PCP is Kathryne Eriksson with [facility].   INTERVAL HISTORY:   Beverly Tucker is a wonderful 78 y.o. female who is here for evaluation and management of breast cancer. The pt  reports that she is doing well overall.  The pt reports that she lives in her own home and has family that lives very close by that comes over to help her out. She was not on oxgen before she went to the hospital but has used it since leaving. Her cough has gotten worse and she has seen her PCP since her hospital visit who believes that the fluid in her lungs has come back. It has been two weeks since her last Thoracentesis. Pt reports that she has lost her appetite and has also lost some weight. The loss of appetite began when she was in the hospital but improved and began again yesterday. She is still having issues dressing her breast wound. Pt has PT twice a week and has nursing assistance 2 days a week, who helps her dress her breast wound, her granddaughter helps her dress it the other days of the week. She denies pain in her pelvis, hips or chest wall, as well as abnormal vaginal discharge. Pt does not have an OBGYN. Pt has never used Marijuana. She denies current dental issues or gum issues with her dentures.  Of note prior to the patient's visit today, pt has had CT Chest ([id number]) completed on 10/14/2018 with results revealing "1. No convincing pneumonia. 2. Moderate left and small right pleural effusions. There is mild bilateral interstitial thickening most evident in the lower lungs. This suggests mild interstitial edema. 3. Dependent atelectasis in the lower lobes, left greater than right, with milder atelectasis in the left upper lobe. 4. Small lung nodules most evident in the right middle lobe. 5. Abnormal soft tissue contiguous with the skin of  the medial right breast. Although this may be infectious/inflammatory, neoplastic disease should be considered. Consider follow-up diagnostic mammography and possible right breast ultrasound for further assessment."  Pt has also had CT Angio Chest ([id number]) completed on 10/16/2018 with results revealing "1. Technically adequate exam showing no acute  pulmonary embolus. 2. Small LEFT pneumothorax, estimated to be 5-10% of lung volume. 3. Small to moderate LEFT pleural effusion and associated Atelectasis. 4. Trace RIGHT pleural effusion and basilar atelectasis. 5. Cardiomegaly and coronary artery disease. 6. RIGHT breast skin thickening and irregularity suspicious for malignancy. Recommend diagnostic mammography and ultrasound when the patient is able. 7. Small bilateral axillary lymph nodes, 1 centimeter in diameter. 8. Aortic Atherosclerosis (ICD10-I70.0)."  Pt has had MRI Brain ([id number]) completed on 10/21/2018 with results revealing "Negative MRI brain with contrast.  Negative for metastatic disease."  As well as CT Abd/Pel ([id number]) completed on 10/21/2018 with results revealing "1. Large malignant left-sided effusion. Trace effusion on the right with basilar atelectasis. 2. Variation in parenchymal enhancement raising the question of hilar lesion in the left chest, consider CT chest if not performed for further evaluation. 3. 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 4. Endometrial thickening may represent a second primary, consider endovaginal sonogram for further assessment as warranted. 5. Question of added enhancement in these central canal along the spinal cord internal nerve roots. This could be seen in the setting of CNS involvement but is not certain. MRI of the spine for additional staging could be performed as warranted. 6. Area of vague hypoattenuation anterior and inferior to portal structures in medial section left hepatic lobe likely an area of fatty infiltration, attention on follow-up or assess with MR for definitive characterization."  Most recent lab results (10/23/2018) of CBC is as follows: WBC at 11.0K, RBC at 3.59, Hgb at 10.6, HCT at 33.5, MCV at 93.3, MCH at 29.5, MCHC at 31.6, RDW at 14.6, PLTs at 291K, nRBC at  0.0. 10/20/2018 BMP is as follows: all values are WNL except for Chloride at 97, Glucose at 114, Calcium at 8.3, GFR Est Non Af Am at 56.   On review of systems, pt reports worsening cough, SOB and denies back pain, abnormal vaginal discharge, hip pain, chest wall tightness, pelvic pain, abdominal pain, dental issues and any other symptoms.   MEDICAL HISTORY:  Past Medical History:  Diagnosis Date   Acute CHF (congestive heart failure) ([place]) 10/2018   GERD (gastroesophageal reflux disease)    Hypertension     SURGICAL HISTORY: Past Surgical History:  Procedure Laterality Date   CHOLECYSTECTOMY     IR THORACENTESIS ASP PLEURAL SPACE W/IMG GUIDE  10/22/2018    SOCIAL HISTORY: Social History   Socioeconomic History   Marital status: Widowed    Spouse name: Not on file   Number of children: Not on file   Years of education: Not on file   Highest education level: Not on file  Occupational History   Not on file  Social Needs   Financial resource strain: Not on file   Food insecurity    Worry: Not on file    Inability: Not on file   Transportation needs    Medical: Not on file    Non-medical: Not on file  Tobacco Use   Smoking status: Never Smoker   Smokeless tobacco: Never Used  Substance and Sexual Activity   Alcohol use: Never  Frequency: Never   Drug use: Never   Sexual activity: Not on file  Lifestyle   Physical activity    Days per week: Not on file    Minutes per session: Not on file   Stress: Not on file  Relationships   Social connections    Talks on phone: Not on file    Gets together: Not on file    Attends religious service: Not on file    Active member of club or organization: Not on file    Attends meetings of clubs or organizations: Not on file    Relationship status: Not on file   Intimate partner violence    Fear of current or ex partner: Not on file    Emotionally abused: Not on file    Physically abused: Not on file     Forced sexual activity: Not on file  Other Topics Concern   Not on file  Social History Narrative   Not on file    FAMILY HISTORY: No family history on file.  ALLERGIES:  has No Known Allergies.  MEDICATIONS:  Current Outpatient Medications  Medication Sig Dispense Refill   ALPRAZolam (XANAX) 0.5 MG tablet Take 0.25-0.5 mg by mouth 2 (two) times daily as needed for anxiety.     apixaban (ELIQUIS) 5 MG TABS tablet Take 1 tablet (5 mg total) by mouth 2 (two) times daily. 60 tablet 1   lisinopril-hydrochlorothiazide (ZESTORETIC) 10-12.5 MG tablet Take 1 tablet by mouth daily.     metoprolol tartrate (LOPRESSOR) 25 MG tablet Take 1 tablet (25 mg total) by mouth 2 (two) times daily. 60 tablet 1   No current facility-administered medications for this visit.     REVIEW OF SYSTEMS:    10 Point review of Systems was done is negative except as noted above.  PHYSICAL EXAMINATION: ECOG PERFORMANCE STATUS: 3 - Symptomatic, >50% confined to bed  . Vitals:   11/04/18 1207  BP: (!) 129/59  Pulse: 77  Resp: 17  Temp: 98.5 F (36.9 C)  SpO2: 98%   Filed Weights   11/04/18 1207  Weight: 189 lb 6.4 oz (85.9 kg)   .Body mass index is 30.57 kg/m.  GENERAL:alert, in no acute distress and comfortable SKIN: no acute rashes, no significant lesions EYES: conjunctiva are pink and non-injected, sclera anicteric OROPHARYNX: MMM, no exudates, no oropharyngeal erythema or ulceration NECK: supple, no JVD LYMPH:  no palpable lymphadenopathy in the cervical, axillary or inguinal regions LUNGS: fluid 1/3 - 1/2 up the left side  HEART: regular rate & rhythm ABDOMEN:  normoactive bowel sounds , non tender, not distended. Extremity: 1+ pedal edema PSYCH: alert & oriented x 3 with fluent speech NEURO: no focal motor/sensory deficits  LABORATORY DATA:  I have reviewed the data as listed  . CBC Latest Ref Rng & Units 11/09/2018 11/07/2018 11/05/2018  WBC 4.0 - 10.5 K/uL 10.4 8.6 12.7(H)    Hemoglobin 12.0 - 15.0 g/dL 10.1(L) 9.8(L) 11.5(L)  Hematocrit 36.0 - 46.0 % 33.9(L) 33.3(L) 38.9  Platelets 150 - 400 K/uL 250 259 347    . CMP Latest Ref Rng & Units 11/09/2018 11/08/2018 11/08/2018  Glucose 70 - 99 mg/dL 128(H) 110(H) 111(H)  BUN 8 - 23 mg/dL '19 18 19  ' Creatinine 0.44 - 1.00 mg/dL 1.07(H) 1.14(H) 1.12(H)  Sodium 135 - 145 mmol/L 136 137 137  Potassium 3.5 - 5.1 mmol/L 4.2 4.0 4.0  Chloride 98 - 111 mmol/L 96(L) 96(L) 96(L)  CO2 22 - 32 mmol/L 30  30 30  Calcium 8.9 - 10.3 mg/dL 8.6(L) 8.7(L) 8.7(L)  Total Protein 6.5 - 8.1 g/dL - - -  Total Bilirubin 0.3 - 1.2 mg/dL - - -  Alkaline Phos 38 - 126 U/L - - -  AST 15 - 41 U/L - - -  ALT 0 - 44 U/L - - -   10/16/2018 Surgical Pathology    10/16/2018 Surgical Pathology    RADIOGRAPHIC STUDIES: I have personally reviewed the radiological images as listed and agreed with the findings in the report. Dg Eye Foreign Body  Result Date: 10/21/2018 CLINICAL DATA:  Previous unspecified eye surgery; clearance prior to MRI EXAM: ORBITS FOR FOREIGN BODY - 2 VIEW COMPARISON:  None. FINDINGS: There is no evidence of metallic foreign body within the orbits. No significant bone abnormality identified. IMPRESSION: No evidence of metallic foreign body within the orbits. Electronically Signed   By: Logan Bores M.D.   On: 10/21/2018 16:34   Dg Chest 1 View  Result Date: 10/22/2018 CLINICAL DATA:  Status post left thoracentesis. EXAM: CHEST  1 VIEW COMPARISON:  Radiograph of same day. FINDINGS: Stable cardiomegaly. No pneumothorax is noted. Left pleural effusion is significantly smaller status post thoracentesis. Mild right basilar atelectasis is noted with probable small right pleural effusion. Bony thorax is unremarkable. IMPRESSION: Left pleural effusion is significantly smaller status post thoracentesis. No pneumothorax is noted. Electronically Signed   By: Marijo Conception M.D.   On: 10/22/2018 12:57   Dg Abd 1 View  Result Date:  10/16/2018 CLINICAL DATA:  Increased SOB as well as nausea and emesis. Hx of CHF, HTN, GERD. EXAM: ABDOMEN - 1 VIEW COMPARISON:  None. FINDINGS: The bowel gas pattern is normal. No radio-opaque calculi or other significant radiographic abnormality are seen. IMPRESSION: Negative. Electronically Signed   By: Nolon Nations M.D.   On: 10/16/2018 18:36   Ct Chest Wo Contrast  Result Date: 10/14/2018 CLINICAL DATA:  78 year old female with past medical history of hypertension who presents with a 3-week history of progressive worsening of shortness of breath. Patient reports that symptoms started with a runny nose and cough productive of clear white mucus. Patient saw outpatient provider, was COVID negative, was started on allergy medication. Shortness of breath continue to progress, saw provider on 9/12, was diagnosed with pneumonia, and started on Keflex. At that visit, patient was also started on doxycycline for cellulitis (under right breast). EXAM: CT CHEST WITHOUT CONTRAST TECHNIQUE: Multidetector CT imaging of the chest was performed following the standard protocol without IV contrast. COMPARISON:  Current chest radiograph. FINDINGS: Cardiovascular: Mild enlargement of the heart. No pericardial effusion. Three-vessel coronary artery calcifications. Great vessels normal in caliber. There are aortic atherosclerotic calcifications. Mediastinum/Nodes: No neck base masses or enlarged lymph nodes. No mediastinal or hilar masses or adenopathy. Trachea and esophagus are unremarkable. Lungs/Pleura: Moderate-sized left and small right pleural effusions. Dependent atelectasis in the lower lobes, left greater than right. Mild interstitial thickening most evident the lower lobes. Linear opacities are noted in the left upper lobe posteriorly and in the lingula consistent with atelectasis. There are a few small irregular nodules, largest subpleural, right lower lobe, image 81, series 4, 6 mm. Mild scarring at the apices. No  pneumothorax. Upper Abdomen: No acute findings. Status post cholecystectomy. Aortic atherosclerosis. Musculoskeletal: No fracture or acute finding. No osteoblastic or osteolytic lesions. Chest wall: There is masslike soft tissue that is contiguous with the skin medial aspect of the right breast, measuring approximately 5.7 x 3.5 x  4.9 cm. There is more generalized breast skin thickening bilaterally. Mildly prominent bilateral axillary lymph nodes are noted, none pathologically enlarged by size criteria. IMPRESSION: 1. No convincing pneumonia. 2. Moderate left and small right pleural effusions. There is mild bilateral interstitial thickening most evident in the lower lungs. This suggests mild interstitial edema. 3. Dependent atelectasis in the lower lobes, left greater than right, with milder atelectasis in the left upper lobe. 4. Small lung nodules most evident in the right middle lobe. 5. Abnormal soft tissue contiguous with the skin of the medial right breast. Although this may be infectious/inflammatory, neoplastic disease should be considered. Consider follow-up diagnostic mammography and possible right breast ultrasound for further assessment. Aortic Atherosclerosis (ICD10-I70.0). Electronically Signed   By: Lajean Manes M.D.   On: 10/14/2018 20:05   Ct Angio Chest Pe W Or Wo Contrast  Result Date: 10/16/2018 CLINICAL DATA:  Urgent patient brought down for PE due to SOB RN stated she had thoracentesis and breast bx and is now SOB and in a EXAM: CT ANGIOGRAPHY CHEST WITH CONTRAST TECHNIQUE: Multidetector CT imaging of the chest was performed using the standard protocol during bolus administration of intravenous contrast. Multiplanar CT image reconstructions and MIPs were obtained to evaluate the vascular anatomy. CONTRAST:  54m OMNIPAQUE IOHEXOL 350 MG/ML SOLN COMPARISON:  Chest x-ray on 10/16/2018 FINDINGS: Cardiovascular: Heart size is mildly enlarged. There is atherosclerotic calcification of the  coronary arteries. Calcification of the mitral annulus. Trace pericardial effusion. The pulmonary arteries are well opacified by contrast bolus and there is no evidence for acute pulmonary embolus. Note is made of atherosclerotic calcification of the thoracic aorta not associated with aneurysm. Mediastinum/Nodes: The visualized portion of the thyroid gland has a normal appearance. Esophagus is normal/in appearance. There are 3 level I RIGHT axillary lymph nodes with abnormal morphology, there small axillary lymph nodes bilaterally, measuring up to 1 centimeter in diameter. No mediastinal or hilar adenopathy. Lungs/Pleura: There is a small anterior LEFT pneumothorax, estimated to be 5-10% of lung volume. Persistent LEFT pleural effusion and associated atelectasis. Trace RIGHT pleural effusion and basilar atelectasis. Upper Abdomen: No acute abnormality. Musculoskeletal: Thoracic kyphosis. No suspicious lytic or blastic lesions are identified. Soft tissues: Irregularity, skin thickening, and asymmetry within the inferior and MEDIAL portion of the RIGHT breast, suspicious for malignancy. Small subcutaneous nodule is identified below the RIGHT breast, measuring 8 millimeters on image 110/5 Review of the MIP images confirms the above findings. IMPRESSION: 1. Technically adequate exam showing no acute pulmonary embolus. 2. Small LEFT pneumothorax, estimated to be 5-10% of lung volume. 3. Small to moderate LEFT pleural effusion and associated atelectasis. 4. Trace RIGHT pleural effusion and basilar atelectasis. 5. Cardiomegaly and coronary artery disease. 6. RIGHT breast skin thickening and irregularity suspicious for malignancy. Recommend diagnostic mammography and ultrasound when the patient is able. 7. Small bilateral axillary lymph nodes, 1 centimeter in diameter. 8. Aortic Atherosclerosis (ICD10-I70.0). These results were called by telephone at the time of interpretation on 10/16/2018 at 7:52 pm to provider Dr. CMarianna Payment  Who verbally acknowledged these results. Electronically Signed   By: ENolon NationsM.D.   On: 10/16/2018 19:53   Mr BJeri CosWQBContrast  Result Date: 10/21/2018 CLINICAL DATA:  Breast cancer staging. EXAM: MRI HEAD WITHOUT AND WITH CONTRAST TECHNIQUE: Multiplanar, multiecho pulse sequences of the brain and surrounding structures were obtained without and with intravenous contrast. CONTRAST:  82mGADAVIST GADOBUTROL 1 MMOL/ML IV SOLN COMPARISON:  None. FINDINGS: Brain: Mild cerebral atrophy. Negative for  hydrocephalus. Negative for acute or chronic infarct. Negative for hemorrhage, mass, or edema. Normal enhancement postcontrast infusion.  No enhancing mass lesion. Vascular: Normal arterial flow voids Skull and upper cervical spine: Negative Sinuses/Orbits: Negative Other: None IMPRESSION: Negative MRI brain with contrast.  Negative for metastatic disease. Electronically Signed   By: Franchot Gallo M.D.   On: 10/21/2018 17:47   Ct Abdomen Pelvis W Contrast  Result Date: 10/22/2018 CLINICAL DATA:  History of breast cancer, breast cancer staging with suspicion for metastatic disease. EXAM: CT ABDOMEN AND PELVIS WITH CONTRAST TECHNIQUE: Multidetector CT imaging of the abdomen and pelvis was performed using the standard protocol following bolus administration of intravenous contrast. CONTRAST:  163m OMNIPAQUE IOHEXOL 300 MG/ML  SOLN COMPARISON:  None. FINDINGS: Lower chest: Opacification of the left inferior chest due to large left-sided pleural effusion with basilar collapse, nodularity in the costodiaphragmatic sulcus best seen on sagittal images the left chest. Variable enhancement of collapsed lung tissue is incompletely imaged. Basilar atelectasis noted on the right with nodular opacity at the right lung base. Heart is incompletely imaged, no visualized pericardial fluid or nodularity. Hepatobiliary: Subtle low-attenuation in the medial section of the left hepatic lobe (hepatic subsegment IV B) best seen  on image 20, series 3 measuring approximately 1.5 x 1.4 cm. Post cholecystectomy without signs of biliary ductal dilation. Pancreas: Unremarkable. No pancreatic ductal dilatation or surrounding inflammatory changes. Spleen: No signs of focal splenic lesion. Adrenals/Urinary Tract: Adrenal glands are normal. No signs of hydronephrosis. Symmetric renal enhancement. Cyst arises from the lower pole the left kidney. Stomach/Bowel: Stomach is normal. Small bowel with normal caliber, no signs of acute small bowel process. A normal appendix. Moderate size periumbilical ventral hernia contains the midportion of the transverse colon, no signs of obstruction at this time. Moderate sigmoid diverticulosis with mild fascial thickening along the posterior aspect of sigmoid colon in the sigmoid mesocolon. Vascular/Lymphatic: Moderate to marked calcific atherosclerosis of the abdominal aorta extending into the iliac vessels. No signs of aneurysm in the abdomen. No retroperitoneal adenopathy. Scattered lymph nodes are present but without pathologic size. No signs of pelvic lymphadenopathy. Reproductive: Thickening of the endometrium beyond what is expected for this 78year old patient measuring nearly 2 cm as estimated by CT. Other: Urinary bladder is decompressed. Hernia as discussed above. No signs of acute process in the body wall with areas of gas in the subcutaneous fat likely related to prior injection. Bowing of levator ani musculature with descent of anterior middle and posterior compartment (bladder base, uterus-vagina and rectum) visceral contents. (Findings are indicative of pelvic floor dysfunction and should be correlated clinically. Musculoskeletal: Mixed lytic and sclerotic process in the right ischium measures 1.8 x 1.6 cm (image 74, series 3). Mixed lytic and sclerotic changes in the right eighth and ninth ribs with subacute fractures at the lateral margin of these ribs. Cortical destruction the medial cortex of the  right tenth rib. Question of increased enhancement within the central canal along the distal aspect of the spinal cord and filum terminalis. Spinal degenerative changes. Small sclerotic focus at the L1 level best seen on sagittal images. Grade 1 anterolisthesis of L5 on S1 with pars defects at this level the left. Vertebral hemangioma at L3. IMPRESSION: 1. Large malignant left-sided effusion. Trace effusion on the right with basilar atelectasis. 2. Variation in parenchymal enhancement raising the question of hilar lesion in the left chest, consider CT chest if not performed for further evaluation. 3. Signs of bony metastatic disease involving the right  ischium right-sided ribs with potential pathologic rib fracture of the right eighth and ninth ribs. 4. Endometrial thickening may represent a second primary, consider endovaginal sonogram for further assessment as warranted. 5. Question of added enhancement in these central canal along the spinal cord internal nerve roots. This could be seen in the setting of CNS involvement but is not certain. MRI of the spine for additional staging could be performed as warranted. 6. Area of vague hypoattenuation anterior and inferior to portal structures in medial section left hepatic lobe likely an area of fatty infiltration, attention on follow-up or assess with MR for definitive characterization. Electronically Signed   By: Zetta Bills M.D.   On: 10/22/2018 08:26   Dg Chest Port 1 View  Result Date: 10/22/2018 CLINICAL DATA:  Pleural effusion. EXAM: PORTABLE CHEST 1 VIEW COMPARISON:  10/16/2018. FINDINGS: Stable cardiomegaly. No pulmonary venous congestion. Prominent left-sided pleural effusion, progressed from prior exam. Underlying left lower lung atelectasis/infiltrate most likely present. Atelectatic changes right lung base also again noted. IMPRESSION: 1.  Stable cardiomegaly.  No pulmonary venous congestion. 2. Prominent left-sided pleural effusion, progressed from  prior exam. Underlying left lower lobe atelectasis/infiltrate most likely present. Atelectatic changes right lung base also again noted. Electronically Signed   By: Marcello Moores  Register   On: 10/22/2018 10:03   Dg Chest Port 1 View  Result Date: 10/16/2018 CLINICAL DATA:  Increased SOB as well as nausea and emesis. Hx of CHF, HTN, GERD. EXAM: PORTABLE CHEST 1 VIEW COMPARISON:  10/16/2018 FINDINGS: The heart is enlarged and stable in configuration. LEFT pleural effusion and LEFT basilar opacity are stable. Atelectasis or early infiltrate at the RIGHT lung base. No pulmonary edema. IMPRESSION: Stable appearance of the chest. Electronically Signed   By: Nolon Nations M.D.   On: 10/16/2018 18:32   Dg Chest Port 1 View  Result Date: 10/16/2018 CLINICAL DATA:  Productive cough, shortness of breath EXAM: PORTABLE CHEST 1 VIEW COMPARISON:  10/15/2018 FINDINGS: Slight interval increase in left-sided pleural effusion with associated atelectasis or consolidation. There is increased, subtle heterogeneous opacity at the right lung base. Cardiomegaly. IMPRESSION: Slight interval increase in left-sided pleural effusion with associated atelectasis or consolidation. There is increased, subtle heterogeneous opacity at the right lung base. Findings remain consistent with infection or aspiration Electronically Signed   By: Eddie Candle M.D.   On: 10/16/2018 15:13   Dg Chest Port 1 View  Result Date: 10/15/2018 CLINICAL DATA:  Follow-up LEFT pneumothorax following thoracentesis. EXAM: PORTABLE CHEST 1 VIEW COMPARISON:  Film earlier this day FINDINGS: The very small LEFT pneumothorax is no longer identified. Mild bibasilar atelectasis again noted. Cardiomegaly is present. No other changes noted. IMPRESSION: No visualized pneumothorax. Mild basilar atelectasis. Electronically Signed   By: Margarette Canada M.D.   On: 10/15/2018 19:11   Dg Chest Portable 1 View  Result Date: 10/15/2018 CLINICAL DATA:  S/p LEFT thoracentesis. EXAM:  PORTABLE CHEST 1 VIEW COMPARISON:  10/14/2018 chest radiograph and CT FINDINGS: Near-complete resolution of visualized LEFT pleural effusion with very small apical and basilar pneumothorax. Bibasilar atelectasis again noted. Cardiomegaly again noted. IMPRESSION: Very small LEFT apical and basilar pneumothorax following LEFT thoracentesis. Near complete resolution of visualized LEFT pleural effusion. Electronically Signed   By: Margarette Canada M.D.   On: 10/15/2018 15:28   Dg Chest Port 1 View  Result Date: 10/14/2018 CLINICAL DATA:  Shortness of breath EXAM: PORTABLE CHEST 1 VIEW COMPARISON:  Chest radiograph dated 10/05/2018. FINDINGS: The heart appears enlarged accounting for technique.  There is a small a moderate left pleural effusion with associated atelectasis/airspace disease. There is mild right basilar atelectasis/airspace disease. There is no pneumothorax. The visualized skeletal structures are unremarkable. IMPRESSION: Small to moderate left pleural effusion with associated atelectasis/airspace disease. Mild right basilar atelectasis/airspace disease. Cardiomegaly. Electronically Signed   By: Zerita Boers M.D.   On: 10/14/2018 14:38   Dg Chest Portable 1 View  Result Date: 10/05/2018 CLINICAL DATA:  Shortness of breath for 2 weeks. EXAM: PORTABLE CHEST 1 VIEW COMPARISON:  None. FINDINGS: External pacing wires overlie the thorax. Cardiomediastinal silhouette is normal. Mediastinal contours appear intact. Left lower lobe airspace consolidation versus atelectasis. Osseous structures are without acute abnormality. Soft tissues are grossly normal. IMPRESSION: Left lower lobe airspace consolidation versus atelectasis. Electronically Signed   By: Fidela Salisbury M.D.   On: 10/05/2018 18:08   Ir Thoracentesis Asp Pleural Space W/img Guide  Result Date: 10/22/2018 INDICATION: Patient with history of CHF, chronic kidney disease, metastatic breast cancer, dyspnea, recurrent left pleural effusion. Request  made for diagnostic and therapeutic left thoracentesis. EXAM: ULTRASOUND GUIDED DIAGNOSTIC AND THERAPEUTIC LEFT THORACENTESIS MEDICATIONS: None COMPLICATIONS: None immediate. PROCEDURE: An ultrasound guided thoracentesis was thoroughly discussed with the patient and questions answered. The benefits, risks, alternatives and complications were also discussed. The patient understands and wishes to proceed with the procedure. Written consent was obtained. Ultrasound was performed to localize and mark an adequate pocket of fluid in the left chest. The area was then prepped and draped in the normal sterile fashion. 1% Lidocaine was used for local anesthesia. Under ultrasound guidance a 6 Fr Safe-T-Centesis catheter was introduced. Thoracentesis was performed. The catheter was removed and a dressing applied. FINDINGS: A total of approximately 820 cc of hazy, amber fluid was removed. Samples were sent to the laboratory as requested by the clinical team. IMPRESSION: Successful ultrasound guided diagnostic and therapeutic left thoracentesis yielding 820 cc of pleural fluid. Read by: Rowe Robert, PA-C Electronically Signed   By: Sandi Mariscal M.D.   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  IHC not done to determine primary site. Pneumothorax status post thoracentesis  #3 Congestive heart failure #4 Atrial fibrillation with RVR on therapeutic anticoagulation #5 CKD #6 hypertension #7 coag negative staph on blood cultures thought to be contaminant  PLAN: -Discussed patient's most recent labs from 10/23/2018, WBC at 11.0K, RBC at 3.59, Hgb at 10.6, HCT at 33.5, MCV at 93.3, MCH at 29.5, MCHC at 31.6, RDW at 14.6, PLTs at 291K, nRBC at 0.0. -Discussed  10/20/2018 BMP is as follows: all values are WNL except for Chloride at 97, Glucose at 114, Calcium at 8.3, GFR Est Non Af Am at 56.  -Discussed 10/14/2018 CT Chest ([id number]) which revealed "1. No convincing pneumonia. 2. Moderate left and small right pleural effusions. There is mild bilateral interstitial thickening most evident in the lower lungs. This suggests mild interstitial edema. 3. Dependent atelectasis in the lower lobes, left greater than right, with milder atelectasis in the left upper lobe. 4. Small lung nodules most evident in the right middle lobe. 5. Abnormal soft tissue contiguous with the skin of the medial right breast. Although this may be infectious/inflammatory, neoplastic disease should be considered. Consider follow-up diagnostic mammography and possible right breast ultrasound for further assessment." -  Discussed 10/16/2018 CT Angio Chest ([id number]) which revealed "1. Technically adequate exam showing no acute pulmonary embolus. 2. Small LEFT pneumothorax, estimated to be 5-10% of lung volume. 3. Small to moderate LEFT pleural effusion and associated Atelectasis. 4. Trace RIGHT pleural effusion and basilar atelectasis. 5. Cardiomegaly and coronary artery disease. 6. RIGHT breast skin thickening and irregularity suspicious for malignancy. Recommend diagnostic mammography and ultrasound when the patient is able. 7. Small bilateral axillary lymph nodes, 1 centimeter in diameter. 8. Aortic Atherosclerosis (ICD10-I70.0)." -Discussed 10/21/2018 MRI Brain ([id number]) which revealed "Negative MRI brain with contrast.  Negative for metastatic disease." -Discussed 10/21/2018 CT Abd/Pel ([id number]) which revealed "1. Large malignant left-sided effusion. Trace effusion on the right with basilar atelectasis. 2. Variation in parenchymal enhancement raising the question of hilar lesion in the left chest, consider CT chest if not performed for further evaluation. 3. Signs of bony metastatic  disease involving the right ischium right-sided ribs with potential pathologic rib fracture of the right eighth and ninth ribs. 4. Endometrial thickening may represent a second primary, consider endovaginal sonogram for further assessment as warranted. 5. Question of added enhancement in these central canal along the spinal cord internal nerve roots. This could be seen in the setting of CNS involvement but is not certain. MRI of the spine for additional staging could be performed as warranted. 6. Area of vague hypoattenuation anterior and inferior to portal structures in medial section left hepatic lobe likely an area of fatty infiltration, attention on follow-up or assess with MR for definitive characterization." -10/16/2018 Surgical Pathology of right breast excision reveals "Metastatic Carcinoma. Tumor cells are NEGATIVE for Her2".  -Advised pt that due to the spread of her disease surgery would not be our best option to treat -Will place pt on a Aromatase inhibitor and a CDK4/6 inhibitor  -Will place pt on Xgeva shots once a month  -Will order an MRI of the spine in 1 week  -Will order US Pelvis in 1 week -Will order Thoracentesis of the left pleural space -Will refer to our dietician, Ernestene Kiel -Will see back in 2 weeks with labs   FOLLOW UP: -US guided thoracentesis left Stat -starting Aromatase inhibitor+ CDK inh (orally ASAP0 -Xgeva q4weeks -US pelvis in 1 week -dietician referral in 1 week -MRI L spine in 1 week -RTC with Dr [name] with labs in 2 weeks  All of the patients questions were answered with apparent satisfaction. The patient knows to call the clinic with any problems, questions or concerns.  I spent 24mns counseling the patient face to face. The total time spent in the appointment was 46ms and more than 50% was on counseling and direct patient cares.    GaSullivan LoneD MSDysartAHIVMS SCDoctor'S Hospital At Deer CreekTUtah Valley Regional Medical Centerematology/Oncology Physician CoManalapan Surgery Center Inc(Office):        [phone] cell):  [phone]):           [phone]/05/2018 3:27 PM  I, JaYevette Edwardsam acting as a scribe for Dr. GaSullivan Lone  .I have reviewed the above documentation for accuracy and completeness, and I agree with the above. GaBrunetta GeneraD

## 2018-11-04 ENCOUNTER — Telehealth: Payer: Self-pay | Admitting: Pharmacist

## 2018-11-04 ENCOUNTER — Other Ambulatory Visit: Payer: Self-pay

## 2018-11-04 ENCOUNTER — Telehealth: Payer: Self-pay

## 2018-11-04 ENCOUNTER — Inpatient Hospital Stay: Payer: Medicare HMO | Attending: Hematology | Admitting: Hematology

## 2018-11-04 VITALS — BP 129/59 | HR 77 | Temp 98.5°F | Resp 17 | Ht 66.0 in | Wt 189.4 lb

## 2018-11-04 DIAGNOSIS — C50919 Malignant neoplasm of unspecified site of unspecified female breast: Secondary | ICD-10-CM | POA: Diagnosis not present

## 2018-11-04 DIAGNOSIS — J90 Pleural effusion, not elsewhere classified: Secondary | ICD-10-CM

## 2018-11-04 DIAGNOSIS — N8500 Endometrial hyperplasia, unspecified: Secondary | ICD-10-CM | POA: Diagnosis not present

## 2018-11-04 DIAGNOSIS — J910 Malignant pleural effusion: Secondary | ICD-10-CM | POA: Insufficient documentation

## 2018-11-04 DIAGNOSIS — G959 Disease of spinal cord, unspecified: Secondary | ICD-10-CM | POA: Diagnosis not present

## 2018-11-04 DIAGNOSIS — I10 Essential (primary) hypertension: Secondary | ICD-10-CM | POA: Insufficient documentation

## 2018-11-04 DIAGNOSIS — E860 Dehydration: Secondary | ICD-10-CM | POA: Insufficient documentation

## 2018-11-04 DIAGNOSIS — C7951 Secondary malignant neoplasm of bone: Secondary | ICD-10-CM | POA: Insufficient documentation

## 2018-11-04 DIAGNOSIS — R630 Anorexia: Secondary | ICD-10-CM | POA: Insufficient documentation

## 2018-11-04 MED ORDER — LETROZOLE 2.5 MG PO TABS: 2.5000 mg | ORAL_TABLET | Freq: Every day | ORAL | 11 refills | Status: DC

## 2018-11-04 MED ORDER — ABEMACICLIB 50 MG PO TABS: 50.0000 mg | ORAL_TABLET | Freq: Two times a day (BID) | ORAL | 1 refills | Status: DC

## 2018-11-04 MED ORDER — ERGOCALCIFEROL 1.25 MG (50000 UT) PO CAPS: 50000.0000 [IU] | ORAL_CAPSULE | ORAL | 0 refills | Status: DC

## 2018-11-04 NOTE — Telephone Encounter (Signed)
Oral Oncology Pharmacist Encounter  Received new prescription for Verzenio (abemaciclib) for the treatment of metastatic, hormone receptor positive, HER2 receptor nonamplified, breast cancer in conjunction with Femara (letrozole), planned duration until disease progression or unacceptable toxicity.  This is a new diagnosis for patient. She is under evaluation to initiate initial endocrine based therapy with abemaciclib at 50 mg by mouth 2 times daily, will increase to target dose of 150 mg by mouth 2 times daily based on toleration  Labs from September 2020 assessed, OK for treatment initiation.  Current medication list in Epic reviewed, no DDIs with abemaciclib identified.  Prescription has been e-scribed to the [facility] for benefits analysis and approval by MD.  Oral Oncology Clinic will continue to follow for insurance authorization, copayment issues, initial counseling and start date.  Johny Drilling, PharmD, BCPS, BCOP  11/04/2018 12:42 PM Oral Oncology Clinic [phone]

## 2018-11-04 NOTE — Telephone Encounter (Signed)
Oral Oncology Patient Advocate Encounter  Received notification from [facility] that prior authorization for Verzenio is required.  PA submitted on CoverMyMeds Key [id number] Status is pending  Oral Oncology Clinic will continue to follow.  [place] Patient [place] Phone [phone] Fax [phone] 11/04/2018    1:48 PM

## 2018-11-04 NOTE — Telephone Encounter (Signed)
Oral Chemotherapy Pharmacist Encounter   I spoke with patient in exam room for overview of: Verzenio (abemaciclib) for the treatment of metastatic, hormone receptor positive, HER2 receptor nonamplified, breast cancer in conjunction with Femara (letrozole), planned duration until disease progression or unacceptable toxicity.   Counseled patient on administration, dosing, side effects, monitoring, drug-food interactions, safe handling, storage, and disposal.  Patient will take Verzenio 50 mg tablets, 1 tablet by mouth twice daily without regard to food.  Patient understands her dose may be increased to target dosing of 150 mg by mouth twice daily, based on toleration.  Patient knows to avoid grapefruit and grapefruit juice.  Patient will also be taking letrozole 2.5 mg once daily.  Verzenio start date: TBD, pending medication toleration  Adverse effects include but are not limited to: diarrhea, fatigue, nausea, abdominal pain, hair loss, decreased blood counts, and increased liver function tests, and joint pains. Severe, life-threatening, and/or fatal interstitial lung disease (ILD) and/or pneumonitis may occur with CDK 4/6 inhibitors.  Patient will obtain anti diarrheal and alert the office of 4 or more loose stools above baseline.  Reviewed with patient importance of keeping a medication schedule and plan for any missed doses.  Medication reconciliation performed and medication/allergy list updated.  Insurance authorization for [employer] will be submitted. We discussed medicare copayment and possibility for high copayment. We discussed options for copayment assistance including copayment grant foundations and manufacturer compassionate use program. Patient instructed that if they are enrolled into manufacturer compassionate use program that medication will be shipped directly to their home from dispensing pharmacy of program's choosing. We will be in touch with patient to coordinate  medication acquisition details.  All questions answered.  Beverly Tucker voiced understanding and appreciation.   Patient knows to call the office with questions or concerns.  Johny Drilling, PharmD, BCPS, BCOP  11/04/2018   1:30 PM Oral Oncology Clinic [phone]

## 2018-11-04 NOTE — Telephone Encounter (Signed)
Oral Oncology Patient Advocate Encounter  Prior Authorization for Beverly Tucker has been approved.    PA# [id number] Effective dates: 11/04/18 through 05/03/19  Oral Oncology Clinic will continue to follow.   [place] Patient [place] Phone [phone] Fax [phone] 11/04/2018    1:48 PM

## 2018-11-05 ENCOUNTER — Telehealth: Payer: Self-pay

## 2018-11-05 ENCOUNTER — Encounter (HOSPITAL_COMMUNITY): Payer: Self-pay | Admitting: Family Medicine

## 2018-11-05 ENCOUNTER — Inpatient Hospital Stay (HOSPITAL_COMMUNITY)
Admission: EM | Admit: 2018-11-05 | Discharge: 2018-11-10 | DRG: 543 | Disposition: A | Discharge status: Home Health | Payer: Medicare HMO | Attending: Family Medicine | Admitting: Family Medicine

## 2018-11-05 ENCOUNTER — Telehealth: Payer: Self-pay | Admitting: Hematology

## 2018-11-05 ENCOUNTER — Emergency Department (HOSPITAL_COMMUNITY): Payer: Medicare HMO

## 2018-11-05 ENCOUNTER — Other Ambulatory Visit: Payer: Self-pay

## 2018-11-05 ENCOUNTER — Other Ambulatory Visit (HOSPITAL_COMMUNITY)
Admission: RE | Admit: 2018-11-05 | Discharge: 2018-11-05 | Disposition: A | Discharge status: Home / Self Care | Payer: Medicare HMO | Source: Ambulatory Visit | Attending: Hematology | Admitting: Hematology

## 2018-11-05 DIAGNOSIS — Z9889 Other specified postprocedural states: Secondary | ICD-10-CM

## 2018-11-05 DIAGNOSIS — N183 Chronic kidney disease, stage 3 unspecified: Secondary | ICD-10-CM | POA: Diagnosis present

## 2018-11-05 DIAGNOSIS — I130 Hypertensive heart and chronic kidney disease with heart failure and stage 1 through stage 4 chronic kidney disease, or unspecified chronic kidney disease: Secondary | ICD-10-CM | POA: Diagnosis present

## 2018-11-05 DIAGNOSIS — J90 Pleural effusion, not elsewhere classified: Secondary | ICD-10-CM | POA: Diagnosis present

## 2018-11-05 DIAGNOSIS — Z79899 Other long term (current) drug therapy: Secondary | ICD-10-CM

## 2018-11-05 DIAGNOSIS — Z170 Estrogen receptor positive status [ER+]: Secondary | ICD-10-CM

## 2018-11-05 DIAGNOSIS — N179 Acute kidney failure, unspecified: Secondary | ICD-10-CM

## 2018-11-05 DIAGNOSIS — J910 Malignant pleural effusion: Secondary | ICD-10-CM

## 2018-11-05 DIAGNOSIS — I10 Essential (primary) hypertension: Secondary | ICD-10-CM

## 2018-11-05 DIAGNOSIS — K219 Gastro-esophageal reflux disease without esophagitis: Secondary | ICD-10-CM | POA: Diagnosis present

## 2018-11-05 DIAGNOSIS — K589 Irritable bowel syndrome without diarrhea: Secondary | ICD-10-CM | POA: Diagnosis present

## 2018-11-05 DIAGNOSIS — N390 Urinary tract infection, site not specified: Secondary | ICD-10-CM | POA: Diagnosis present

## 2018-11-05 DIAGNOSIS — I480 Paroxysmal atrial fibrillation: Secondary | ICD-10-CM

## 2018-11-05 DIAGNOSIS — C50911 Malignant neoplasm of unspecified site of right female breast: Secondary | ICD-10-CM | POA: Diagnosis present

## 2018-11-05 DIAGNOSIS — I5032 Chronic diastolic (congestive) heart failure: Secondary | ICD-10-CM | POA: Diagnosis present

## 2018-11-05 DIAGNOSIS — C7951 Secondary malignant neoplasm of bone: Principal | ICD-10-CM | POA: Diagnosis present

## 2018-11-05 DIAGNOSIS — C50919 Malignant neoplasm of unspecified site of unspecified female breast: Secondary | ICD-10-CM | POA: Diagnosis present

## 2018-11-05 DIAGNOSIS — B9620 Unspecified Escherichia coli [E. coli] as the cause of diseases classified elsewhere: Secondary | ICD-10-CM | POA: Diagnosis present

## 2018-11-05 DIAGNOSIS — Z20828 Contact with and (suspected) exposure to other viral communicable diseases: Secondary | ICD-10-CM | POA: Diagnosis present

## 2018-11-05 DIAGNOSIS — Z7901 Long term (current) use of anticoagulants: Secondary | ICD-10-CM

## 2018-11-05 HISTORY — DX: Malignant (primary) neoplasm, unspecified: C80.1

## 2018-11-05 NOTE — ED Triage Notes (Signed)
Patient is complaining of shortness of breath and a cough. Patient was discharged from [facility] on 09/23 for breast cancer, shortness of breath with exudative plural effusion, and A-fib with RVR. Patient is currently on 2L of oxygen and appears in no acute distress. She is able to talk in full sentences.

## 2018-11-05 NOTE — Telephone Encounter (Signed)
Oral Oncology Patient Advocate Encounter  Verzenio copay is [phone], this is not affordable for the patient.  I can help the patient apply for patient assistance with [employer] in an attempt to get the Verzenio at $0 out of pocket cost to her.  I also have a Verzenio voucher that we can use one time in order to fill at [facility] and get the medicine in her hand quickly.  I called the patient and went over this information with her. She agreed to apply for patient assistance. She will bring in her income info and sign the application on Friday, 0000000.  Martin will fill the [facility] for pick up on 10/7 with the one time free voucher.  While discussing the patients income I determined that she may benefit from applying for Low income subsidy (LIS). The patient agreed and we submitted it online today. She will call me when she gets the letter from [facility] and we will determine next steps from there.  In the meantime we will proceed with patient assistance and the patients grand daughter will pick her Verzenio up at [place] tomorrow, 10/7.  Oral Oncology clinic will continue to follow.  [place] Patient [place] Phone [phone] Fax [phone] 11/05/2018   11:36 AM

## 2018-11-05 NOTE — ED Notes (Signed)
Pt ambulated in room on 2L [place]. Pt remained between 95-98%. Pt stated she did not feel short of breath, however, her work of breathing increased with ambulation.

## 2018-11-05 NOTE — Telephone Encounter (Signed)
Scheduled appt per 10/5 los.  Left a detailed VM of the appt date and times.

## 2018-11-06 ENCOUNTER — Inpatient Hospital Stay (HOSPITAL_COMMUNITY): Payer: Medicare HMO

## 2018-11-06 ENCOUNTER — Telehealth: Payer: Self-pay | Admitting: Pharmacist

## 2018-11-06 DIAGNOSIS — N183 Chronic kidney disease, stage 3 unspecified: Secondary | ICD-10-CM | POA: Diagnosis present

## 2018-11-06 DIAGNOSIS — J90 Pleural effusion, not elsewhere classified: Secondary | ICD-10-CM | POA: Diagnosis present

## 2018-11-06 DIAGNOSIS — Z170 Estrogen receptor positive status [ER+]: Secondary | ICD-10-CM | POA: Diagnosis not present

## 2018-11-06 DIAGNOSIS — C7951 Secondary malignant neoplasm of bone: Secondary | ICD-10-CM | POA: Diagnosis present

## 2018-11-06 DIAGNOSIS — D72829 Elevated white blood cell count, unspecified: Secondary | ICD-10-CM | POA: Diagnosis not present

## 2018-11-06 DIAGNOSIS — Z7901 Long term (current) use of anticoagulants: Secondary | ICD-10-CM | POA: Diagnosis not present

## 2018-11-06 DIAGNOSIS — N179 Acute kidney failure, unspecified: Secondary | ICD-10-CM

## 2018-11-06 DIAGNOSIS — K589 Irritable bowel syndrome without diarrhea: Secondary | ICD-10-CM | POA: Diagnosis present

## 2018-11-06 DIAGNOSIS — I130 Hypertensive heart and chronic kidney disease with heart failure and stage 1 through stage 4 chronic kidney disease, or unspecified chronic kidney disease: Secondary | ICD-10-CM | POA: Diagnosis present

## 2018-11-06 DIAGNOSIS — I5033 Acute on chronic diastolic (congestive) heart failure: Secondary | ICD-10-CM | POA: Diagnosis not present

## 2018-11-06 DIAGNOSIS — C50911 Malignant neoplasm of unspecified site of right female breast: Secondary | ICD-10-CM | POA: Diagnosis present

## 2018-11-06 DIAGNOSIS — I5032 Chronic diastolic (congestive) heart failure: Secondary | ICD-10-CM | POA: Diagnosis present

## 2018-11-06 DIAGNOSIS — J910 Malignant pleural effusion: Secondary | ICD-10-CM | POA: Diagnosis present

## 2018-11-06 DIAGNOSIS — I480 Paroxysmal atrial fibrillation: Secondary | ICD-10-CM

## 2018-11-06 DIAGNOSIS — Z79899 Other long term (current) drug therapy: Secondary | ICD-10-CM | POA: Diagnosis not present

## 2018-11-06 DIAGNOSIS — B9620 Unspecified Escherichia coli [E. coli] as the cause of diseases classified elsewhere: Secondary | ICD-10-CM | POA: Diagnosis present

## 2018-11-06 DIAGNOSIS — N390 Urinary tract infection, site not specified: Secondary | ICD-10-CM | POA: Diagnosis present

## 2018-11-06 DIAGNOSIS — C50919 Malignant neoplasm of unspecified site of unspecified female breast: Secondary | ICD-10-CM | POA: Diagnosis not present

## 2018-11-06 DIAGNOSIS — K219 Gastro-esophageal reflux disease without esophagitis: Secondary | ICD-10-CM | POA: Diagnosis present

## 2018-11-06 DIAGNOSIS — I4891 Unspecified atrial fibrillation: Secondary | ICD-10-CM | POA: Diagnosis not present

## 2018-11-06 DIAGNOSIS — Z20828 Contact with and (suspected) exposure to other viral communicable diseases: Secondary | ICD-10-CM | POA: Diagnosis present

## 2018-11-06 LAB — COMPREHENSIVE METABOLIC PANEL
ALT: 26 U/L (ref 0–44)
AST: 21 U/L (ref 15–41)
Albumin: 3.7 g/dL (ref 3.5–5.0)
Alkaline Phosphatase: 80 U/L (ref 38–126)
Anion gap: 10 (ref 5–15)
BUN: 24 mg/dL — ABNORMAL HIGH (ref 8–23)
CO2: 29 mmol/L (ref 22–32)
Calcium: 9 mg/dL (ref 8.9–10.3)
Chloride: 99 mmol/L (ref 98–111)
Creatinine, Ser: 1.37 mg/dL — ABNORMAL HIGH (ref 0.44–1.00)
GFR calc Af Amer: 42 mL/min — ABNORMAL LOW (ref >60)
GFR calc non Af Amer: 37 mL/min — ABNORMAL LOW (ref >60)
Glucose, Bld: 108 mg/dL — ABNORMAL HIGH (ref 70–99)
Potassium: 4.1 mmol/L (ref 3.5–5.1)
Sodium: 138 mmol/L (ref 135–145)
Total Bilirubin: 0.4 mg/dL (ref 0.3–1.2)
Total Protein: 7.3 g/dL (ref 6.5–8.1)

## 2018-11-06 LAB — BASIC METABOLIC PANEL
Anion gap: 11 (ref 5–15)
BUN: 24 mg/dL — ABNORMAL HIGH (ref 8–23)
CO2: 30 mmol/L (ref 22–32)
Calcium: 8.7 mg/dL — ABNORMAL LOW (ref 8.9–10.3)
Chloride: 99 mmol/L (ref 98–111)
Creatinine, Ser: 1.27 mg/dL — ABNORMAL HIGH (ref 0.44–1.00)
GFR calc Af Amer: 46 mL/min — ABNORMAL LOW (ref >60)
GFR calc non Af Amer: 40 mL/min — ABNORMAL LOW (ref >60)
Glucose, Bld: 107 mg/dL — ABNORMAL HIGH (ref 70–99)
Potassium: 4 mmol/L (ref 3.5–5.1)
Sodium: 140 mmol/L (ref 135–145)

## 2018-11-06 LAB — CBC WITH DIFFERENTIAL/PLATELET
Abs Immature Granulocytes: 0.07 10*3/uL (ref 0.00–0.07)
Basophils Absolute: 0.1 10*3/uL (ref 0.0–0.1)
Basophils Relative: 1 %
Eosinophils Absolute: 0.2 10*3/uL (ref 0.0–0.5)
Eosinophils Relative: 2 %
HCT: 38.9 % (ref 36.0–46.0)
Hemoglobin: 11.5 g/dL — ABNORMAL LOW (ref 12.0–15.0)
Immature Granulocytes: 1 %
Lymphocytes Relative: 15 %
Lymphs Abs: 2 10*3/uL (ref 0.7–4.0)
MCH: 28.5 pg (ref 26.0–34.0)
MCHC: 29.6 g/dL — ABNORMAL LOW (ref 30.0–36.0)
MCV: 96.5 fL (ref 80.0–100.0)
Monocytes Absolute: 0.9 10*3/uL (ref 0.1–1.0)
Monocytes Relative: 7 %
Neutro Abs: 9.5 10*3/uL — ABNORMAL HIGH (ref 1.7–7.7)
Neutrophils Relative %: 74 %
Platelets: 347 10*3/uL (ref 150–400)
RBC: 4.03 MIL/uL (ref 3.87–5.11)
RDW: 14.8 % (ref 11.5–15.5)
WBC: 12.7 10*3/uL — ABNORMAL HIGH (ref 4.0–10.5)
nRBC: 0 % (ref 0.0–0.2)

## 2018-11-06 LAB — NOVEL CORONAVIRUS, NAA (HOSP ORDER, SEND-OUT TO REF LAB; TAT 18-24 HRS): SARS-CoV-2, NAA: NOT DETECTED

## 2018-11-06 LAB — BRAIN NATRIURETIC PEPTIDE: B Natriuretic Peptide: 47.6 pg/mL (ref 0.0–100.0)

## 2018-11-06 LAB — SARS CORONAVIRUS 2 (TAT 6-24 HRS): SARS Coronavirus 2: NEGATIVE

## 2018-11-06 MED ORDER — ACETAMINOPHEN 650 MG RE SUPP: 650.0000 mg | Freq: Four times a day (QID) | RECTAL | Status: DC | PRN

## 2018-11-06 MED ORDER — METOPROLOL TARTRATE 25 MG PO TABS
25.0000 mg | ORAL_TABLET | Freq: Two times a day (BID) | ORAL | Status: DC
  Administered 2018-11-06 – 2018-11-10 (×9): 25 mg via ORAL
  Filled 2018-11-06 (×9): qty 1

## 2018-11-06 MED ORDER — ACETAMINOPHEN 325 MG PO TABS: 650.0000 mg | ORAL_TABLET | Freq: Four times a day (QID) | ORAL | Status: DC | PRN

## 2018-11-06 MED ORDER — LIDOCAINE HCL 1 % IJ SOLN
INTRAMUSCULAR | Status: AC
  Filled 2018-11-06: qty 10

## 2018-11-06 MED ORDER — SODIUM CHLORIDE 0.9 % IV SOLN
INTRAVENOUS | Status: AC
  Administered 2018-11-06: 06:00:00 via INTRAVENOUS

## 2018-11-06 MED ORDER — ALPRAZOLAM 0.25 MG PO TABS
0.2500 mg | ORAL_TABLET | Freq: Two times a day (BID) | ORAL | Status: DC | PRN
  Administered 2018-11-06 – 2018-11-10 (×3): 0.5 mg via ORAL
  Filled 2018-11-06 (×3): qty 2

## 2018-11-06 MED FILL — VERZENIO 50 MG TABS: 50 | 28 days supply | Qty: 56 | Fill #0

## 2018-11-06 NOTE — ED Notes (Signed)
Pt resting in bed at this time. NAD noted. Equal rise and fall of chest noted.

## 2018-11-06 NOTE — ED Notes (Signed)
Pt able to ambulate to restroom without assistance.

## 2018-11-06 NOTE — ED Notes (Signed)
Pt assisted back into bed.

## 2018-11-06 NOTE — ED Notes (Signed)
Dr. Rathore at bedside 

## 2018-11-06 NOTE — Telephone Encounter (Signed)
Oral Oncology Pharmacist Encounter  Received voicemail from patient's son, [name], with additional questions about patient's office visit this week.  Specific questions included: where does patient go for thoracentesis on 11/08/2018, will patient receive resection for her breast cancer, why has ergocalciferol been prescribed, and when should she start the letrozole that was picked up at the local pharmacy on 11/06/2018?  I returned call to [name] and informed him that Dr. [name] and his collaborative practice RN would be the best source of information for thoracentesis and resection questions.  [name] informed that his mom's vitamin D level was low when it was drawn while she was admitted during September, and that is the reason that high-dose vitamin D has been prescribed.   It is okay for her to start this next week. [name] also informed about letrozole and abemaciclib prescriptions. His daughter (patient's granddaughter) is still planning to pick up the abemaciclib from the [facility] today.  Eric expressed dissatisfaction for lack of phone call during his mom's office visit on 11/04/2018.  [name] was transferred to Visual merchandiser telephone to leave a voicemail to get some additional questions about treatment plan answered.  Patient was admitted through the emergency department last night and is planned to have paracentesis while she is in the hospital today. [name] informed that treatment medications will likely not be started until his mom is discharged, however, the direction of her plan of care will be made by Dr. [name].  All questions answered. [name] knows to call the office with additional questions or concerns.  Johny Drilling, PharmD, BCPS, BCOP  11/06/2018 8:52 AM Oral Oncology Clinic [phone]

## 2018-11-06 NOTE — ED Notes (Signed)
Pt transported to IR 

## 2018-11-06 NOTE — ED Notes (Signed)
EKG that was done in triage was originally given to Dr. Darl Householder. Found in EKG tray and given to Dr. Leonette Monarch at his request.

## 2018-11-06 NOTE — ED Notes (Signed)
RN performed wound care to rt sided breast, sangrias drainage is noted. , cleaned area with normal saline, applied gauze and covered with ABD pads, secured with tape.

## 2018-11-06 NOTE — ED Notes (Signed)
2nd call Internal Medicine @0334 

## 2018-11-06 NOTE — ED Provider Notes (Signed)
[facility] DEPT Provider Note  CSN: [id number] Arrival date & time: 11/05/18 2055  Chief Complaint(s) Shortness of Breath  HPI Beverly Tucker is a 78 y.o. female with a past medical history listed below including atrial fibrillation on Eliquis, metastatic breast cancer with associated pleural effusion requiring thoracentesis during recent admission, diastolic heart failure who presents to the emergency department with 2 days of worsening shortness of breath similar to her pleural effusion from last time.  Patient reports that she is unable to lie flat due to the shortness of breath.  Also has dyspnea on exertion.  She also endorses dry cough.  No fevers or chills.  No nausea or vomiting.  No chest pain.  No abdominal pain.  She endorses improve peripheral edema since last admission.  Patient was sent home on 2 L nasal cannula during last admission which she is currently on.  Reports that she is supposed to start chemotherapy for her cancer but has not picked up the medication yet.  She is scheduled for thoracentesis on Friday but is too symptomatic to wait.  HPI  Past Medical History Past Medical History:  Diagnosis Date  . Acute CHF (congestive heart failure) ([place]) 10/2018  . Atrial fibrillation ([place])   . Cancer (HCC)    Breast   . GERD (gastroesophageal reflux disease)   . Hypertension    Patient Active Problem List   Diagnosis Date Noted  . Bone metastases ([place]) 11/04/2018  . Metastatic breast cancer ([place])   . Pneumothorax   . Gram-positive bacteremia   . Mass of breast, right 10/15/2018  . 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 Acute CHF (congestive heart failure) ([place]) 10/15/2018  . Shortness of breath 10/14/2018  . CKD (chronic kidney disease) stage 3, GFR 30-59 ml/min 02/06/2018  . Hypertension 12/01/2015   Home Medication(s) Prior to Admission medications   Medication Sig Start Date End Date Taking? Authorizing Provider  abemaciclib  (VERZENIO) 50 MG tablet Take 1 tablet (50 mg total) by mouth 2 (two) times daily. Swallow tablets whole. Do not chew, crush, or split tablets before swallowing. 11/04/18  Yes Brunetta Genera, MD  ALPRAZolam Duanne Moron) 0.5 MG tablet Take 0.25-0.5 mg by mouth 2 (two) times daily as needed for anxiety. 05/31/17  Yes [provider]  apixaban (ELIQUIS) 5 MG TABS tablet Take 1 tablet (5 mg total) by mouth 2 (two) times daily. 10/23/18  Yes Jeanmarie Hubert, MD  ergocalciferol (VITAMIN D2) 1.25 MG (50000 UT) capsule Take 1 capsule (50,000 Units total) by mouth once a week. 11/04/18  Yes Brunetta Genera, MD  letrozole [facility]) 2.5 MG tablet Take 1 tablet (2.5 mg total) by mouth daily. 11/04/18  Yes Brunetta Genera, MD  lisinopril-hydrochlorothiazide (ZESTORETIC) 10-12.5 MG tablet Take 1 tablet by mouth daily. 10/08/18  Yes [provider]  metoprolol tartrate (LOPRESSOR) 25 MG tablet Take 1 tablet (25 mg total) by mouth 2 (two) times daily. 10/23/18  Yes Jeanmarie Hubert, MD  Past Surgical History Past Surgical History:  Procedure Laterality Date  . CHOLECYSTECTOMY    . IR THORACENTESIS ASP PLEURAL SPACE W/IMG GUIDE  10/22/2018   Family History History reviewed. No pertinent family history.  Social History Social History   Tobacco Use  . Smoking status: Never Smoker  . Smokeless tobacco: Never Used  Substance Use Topics  . Alcohol use: Never    Frequency: Never  . Drug use: Never   Allergies Patient has no known allergies.  Review of Systems Review of Systems All other systems are reviewed and are negative for acute change except as noted in the HPI  Physical Exam Vital Signs  I have reviewed the triage vital signs BP 111/63   Pulse 66   Temp 98.9 F (37.2 C) (Oral)   Resp 20   Ht 5\' 6"  (1.676 m)   Wt 84.4 kg   SpO2 95%   BMI  30.02 kg/m   Physical Exam Vitals signs reviewed.  Constitutional:      General: She is not in acute distress.    Appearance: She is well-developed. She is not diaphoretic.  HENT:     Head: Normocephalic and atraumatic.     Nose: Nose normal.  Eyes:     General: No scleral icterus.       Right eye: No discharge.        Left eye: No discharge.     Conjunctiva/sclera: Conjunctivae normal.     Pupils: Pupils are equal, round, and reactive to light.  Neck:     Musculoskeletal: Normal range of motion and neck supple.  Cardiovascular:     Rate and Rhythm: Normal rate and regular rhythm.     Heart sounds: No murmur. No friction rub. No gallop.   Pulmonary:     Effort: Pulmonary effort is normal. Tachypnea present. No respiratory distress.     Breath sounds: No stridor. Examination of the left-middle field reveals decreased breath sounds. Examination of the left-lower field reveals decreased breath sounds. Decreased breath sounds present. No wheezing, rhonchi or rales.  Abdominal:     General: There is no distension.     Palpations: Abdomen is soft.     Tenderness: There is no abdominal tenderness.  Musculoskeletal:        General: No tenderness.  Skin:    General: Skin is warm and dry.     Findings: No erythema or rash.  Neurological:     Mental Status: She is alert and oriented to person, place, and time.     ED Results and Treatments Labs (all labs ordered are listed, but only abnormal results are displayed) Labs Reviewed  CBC WITH DIFFERENTIAL/PLATELET - Abnormal; Notable for the following components:      Result Value   WBC 12.7 (*)    Hemoglobin 11.5 (*)    MCHC 29.6 (*)    Neutro Abs 9.5 (*)    All other components within normal limits  COMPREHENSIVE METABOLIC PANEL - Abnormal; Notable for the following components:   Glucose, Bld 108 (*)    BUN 24 (*)    Creatinine, Ser 1.37 (*)    GFR calc non Af Amer 37 (*)    GFR calc Af Amer 42 (*)    All other components  within normal limits  SARS CORONAVIRUS 2 (TAT 6-24 HRS)  BRAIN NATRIURETIC PEPTIDE  EKG did not cross into MUSE       Radiology Dg Chest 2 View  Result Date: 11/05/2018 CLINICAL DATA:  Shortness of breath EXAM: CHEST - 2 VIEW COMPARISON:  October 22, 2018 FINDINGS: There is mild cardiomegaly. A moderate to large left pleural effusion is seen. There is trace right pleural effusion. Mildly increased interstitial markings seen predominantly at the right lung base, likely chronic lung changes. Air not calcifications. IMPRESSION: Moderate to large left and trace right pleural effusion. Electronically Signed   By: Prudencio Pair M.D.   On: 11/05/2018 22:09    Pertinent labs & imaging results that were available during my care of the patient were reviewed by me and considered in my medical decision making (see chart for details).  Medications Ordered in ED Medications - No data to display                                                                                                                                  Procedures Procedures  (including critical care time)  Medical Decision Making / ED Course I have reviewed the nursing notes for this encounter and the patient's prior records (if available in EHR or on provided paperwork).   Beverly Tucker was evaluated in Emergency Department on 11/06/2018 for the symptoms described in the history of present illness. She was evaluated in the context of the global COVID-19 pandemic, which necessitated consideration that the patient might be at risk for infection with the SARS-CoV-2 virus that causes COVID-19. Institutional protocols and algorithms that pertain to the evaluation of patients at risk for COVID-19 are in a state of rapid change based on information released by regulatory bodies including the CDC and federal and state  organizations. These policies and algorithms were followed during the patient's care in the ED.  2 days of gradually worsening shortness of breath.  History of metastatic disease resulting in pleural effusions.  Reports that this is similar to her previous pleural effusion.  Chest x-ray notable for moderate to severe left pleural effusion with trace right pleural effusion.  This appears to be worse than the previous effusions from last month.  Patient is satting well on her 2 L nasal cannula.  No evidence of volume overload on exam, but does have mild increased work of breathing.  EKG without acute ischemic changes.  No dysrhythmias.  No blocks.  Patient has mild AKI on labs.  Otherwise no significant electrolyte derangements.   Internal medicine patient but no beds available at [facility].  Hospitalist will admit here at [facility] long for further management.      Final Clinical Impression(s) / ED Diagnoses Final diagnoses:  Malignant pleural effusion      This chart was dictated using voice recognition software.  Despite best efforts to proofread,  errors can occur which can change the documentation meaning.   Fatima Blank, MD 11/06/18 [phone]

## 2018-11-06 NOTE — H&P (Signed)
History and Physical    Beverly Tucker [id number] DOB: October 30, 1940 DOA: 11/05/2018  PCP: [name], MD Patient coming from: Home  Chief Complaint: Shortness of breath  HPI: Beverly Tucker is a 78 y.o. female with medical history significant of atrial fibrillation on Eliquis, metastatic breast cancer with associated pleural effusion requiring thoracentesis during recent admission, chronic diastolic congestive heart failure, hypertension, GERD presenting to the emergency department with complaints of shortness of breath.  2-day history of dyspnea especially worse exertion.  She has also been coughing.  Denies fevers or chills.  She has not been eating much.  Denies nausea, vomiting, abdominal pain, or diarrhea.  States she was recently seen by oncology and was supposed to start 2 medications today for her cancer.  She has not taken these medications yet.  Oncology was also planning to schedule her for thoracentesis on 10/9 but advised her to come into the ED if she became dyspneic.  States she has been using 2 L supplemental oxygen since her recent hospital discharge.  ED Course: Afebrile.  Not tachypneic.  On 2 L supplemental oxygen, same as home requirement.  SARS-CoV-2 test pending.  White count 12.7.  BNP normal.  BUN 24, creatinine 1.3.  Baseline creatinine 0.9-1.0.  Chest x-ray showing moderate to large left and trace right pleural effusion.  Review of Systems:  All systems reviewed and apart from history of presenting illness, are negative.  Past Medical History:  Diagnosis Date  . Acute CHF (congestive heart failure) ([place]) 10/2018  . Atrial fibrillation ([place])   . Cancer (HCC)    Breast   . GERD (gastroesophageal reflux disease)   . Hypertension     Past Surgical History:  Procedure Laterality Date  . CHOLECYSTECTOMY    . IR THORACENTESIS ASP PLEURAL SPACE W/IMG GUIDE  10/22/2018     reports that she has never smoked. She has never used smokeless tobacco. She reports that  she does not drink alcohol or use drugs.  No Known Allergies  History reviewed. No pertinent family history.  Prior to Admission medications   Medication Sig Start Date End Date Taking? Authorizing Provider  abemaciclib (VERZENIO) 50 MG tablet Take 1 tablet (50 mg total) by mouth 2 (two) times daily. Swallow tablets whole. Do not chew, crush, or split tablets before swallowing. 11/04/18  Yes Brunetta Genera, MD  ALPRAZolam Duanne Moron) 0.5 MG tablet Take 0.25-0.5 mg by mouth 2 (two) times daily as needed for anxiety. 05/31/17  Yes [provider]  apixaban (ELIQUIS) 5 MG TABS tablet Take 1 tablet (5 mg total) by mouth 2 (two) times daily. 10/23/18  Yes Jeanmarie Hubert, MD  ergocalciferol (VITAMIN D2) 1.25 MG (50000 UT) capsule Take 1 capsule (50,000 Units total) by mouth once a week. 11/04/18  Yes Brunetta Genera, MD  letrozole [facility]) 2.5 MG tablet Take 1 tablet (2.5 mg total) by mouth daily. 11/04/18  Yes Brunetta Genera, MD  lisinopril-hydrochlorothiazide (ZESTORETIC) 10-12.5 MG tablet Take 1 tablet by mouth daily. 10/08/18  Yes [provider]  metoprolol tartrate (LOPRESSOR) 25 MG tablet Take 1 tablet (25 mg total) by mouth 2 (two) times daily. 10/23/18  Yes Jeanmarie Hubert, MD    Physical Exam: Vitals:   11/06/18 0400 11/06/18 0430 11/06/18 0500 11/06/18 0530  BP: (!) 138/95 (!) 117/54 (!) 116/55 (!) 109/50  Pulse: 73 65 65 62  Resp: 19 20  20   Temp:      TempSrc:      SpO2:  99% 97% 97% 97%  Weight:      Height:        Physical Exam  Constitutional: She is oriented to person, place, and time. She appears well-developed and well-nourished. No distress.  HENT:  Head: Normocephalic.  Mouth/Throat: Oropharynx is clear and moist.  Eyes: Right eye exhibits no discharge. Left eye exhibits no discharge.  Neck: Neck supple.  Cardiovascular: Normal rate, regular rhythm and intact distal pulses.  Pulmonary/Chest: Effort normal. She has no wheezes. She has no  rales.  Diminished breath sounds appreciated in the entire left lung field  Abdominal: Soft. Bowel sounds are normal. She exhibits no distension. There is no abdominal tenderness. There is no guarding.  Musculoskeletal:        General: No edema.  Neurological: She is alert and oriented to person, place, and time.  Skin: Skin is warm and dry. She is not diaphoretic.  Psychiatric: She has a normal mood and affect. Her behavior is normal.     Labs on Admission: I have personally reviewed following labs and imaging studies  CBC: Recent Labs  Lab 11/05/18 2353  WBC 12.7*  NEUTROABS 9.5*  HGB 11.5*  HCT 38.9  MCV 96.5  PLT [id number]   Basic Metabolic Panel: Recent Labs  Lab 11/05/18 2353 11/06/18 0539  NA 138 140  K 4.1 4.0  CL 99 99  CO2 29 30  GLUCOSE 108* 107*  BUN 24* 24*  CREATININE 1.37* 1.27*  CALCIUM 9.0 8.7*   GFR: Estimated Creatinine Clearance: 39.3 mL/min (A) (by C-G formula based on SCr of 1.27 mg/dL (H)). Liver Function Tests: Recent Labs  Lab 11/05/18 2353  AST 21  ALT 26  ALKPHOS 80  BILITOT 0.4  PROT 7.3  ALBUMIN 3.7   No results for input(s): LIPASE, AMYLASE in the last 168 hours. No results for input(s): AMMONIA in the last 168 hours. Coagulation Profile: No results for input(s): INR, PROTIME in the last 168 hours. Cardiac Enzymes: No results for input(s): CKTOTAL, CKMB, CKMBINDEX, TROPONINI in the last 168 hours. BNP (last 3 results) No results for input(s): PROBNP in the last 8760 hours. HbA1C: No results for input(s): HGBA1C in the last 72 hours. CBG: No results for input(s): GLUCAP in the last 168 hours. Lipid Profile: No results for input(s): CHOL, HDL, LDLCALC, TRIG, CHOLHDL, LDLDIRECT in the last 72 hours. Thyroid Function Tests: No results for input(s): TSH, T4TOTAL, FREET4, T3FREE, THYROIDAB in the last 72 hours. Anemia Panel: No results for input(s): VITAMINB12, FOLATE, FERRITIN, TIBC, IRON, RETICCTPCT in the last 72 hours. Urine  analysis: No results found for: COLORURINE, APPEARANCEUR, [place], [place], GLUCOSEU, [place], [place], KETONESUR, PROTEINUR, UROBILINOGEN, NITRITE, LEUKOCYTESUR  Radiological Exams on Admission: Dg Chest 2 View  Result Date: 11/05/2018 CLINICAL DATA:  Shortness of breath EXAM: CHEST - 2 VIEW COMPARISON:  October 22, 2018 FINDINGS: There is mild cardiomegaly. A moderate to large left pleural effusion is seen. There is trace right pleural effusion. Mildly increased interstitial markings seen predominantly at the right lung base, likely chronic lung changes. Air not calcifications. IMPRESSION: Moderate to large left and trace right pleural effusion. Electronically Signed   By: Prudencio Pair M.D.   On: 11/05/2018 22:09    EKG: Pending at this time.  Assessment/Plan Principal Problem:   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 Active Problems:   HTN (hypertension)   Metastatic breast cancer (HCC)   AKI (acute kidney injury) (HCC)   AF (paroxysmal atrial fibrillation) (HCC)  Recurrent malignant pleural effusion Afebrile  and no significant leukocytosis.  White count mildly elevated at 12.7.  Not tachypneic.  Satting well on 2 L supplemental oxygen which she has been using since her recent hospital discharge.  Chest x-ray showing moderate to large left and trace right pleural effusion. BNP normal.  Echo done 9/15 with normal systolic function and no significant diastolic dysfunction.  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 -IR guided thoracentesis.  Order has been placed. -Continuous pulse ox -Continue supplemental oxygen  AKI BUN 24, creatinine 1.3.  Baseline creatinine 0.9-1.0.  Suspect prerenal due to decreased p.o. intake and home lisinopril-hydrochlorothiazide use. -Gentle IV fluid hydration -Continue to monitor renal function -Hold ACE inhibitor and diuretic at this time  -Monitor urine output  Metastatic breast cancer Patient was seen by Dr. [name] during her recent hospital admission.  She then followed up with oncology on 10/5 and plan was to start her on an aromatase inhibitor and a CDK 4/6 inhibitor.  She has not started taking these medications yet. -Consult oncology in a.m.  Paroxysmal atrial fibrillation -Continue home metoprolol -Hold home Eliquis at this time.  Pending IR evaluation in the morning for thoracentesis.  Hypertension -Hold home lisinopril-hydrochlorothiazide at this time given AKI -Continue home metoprolol  DVT prophylaxis: SCDs at this time Code Status: Patient wishes to be full code. Family Communication: No family available. Disposition Plan: Anticipate discharge after clinical improvement. Consults called: None Admission status: It is my clinical opinion that referral for OBSERVATION is reasonable and necessary in this patient based on the above information provided. The aforementioned taken together are felt to place the patient at high risk for further clinical deterioration. However it is anticipated that the patient may be medically stable for discharge from the hospital within 24 to 48 hours.  The medical decision making on this patient was of high complexity and the patient is at high risk for clinical deterioration, therefore this is a level 3 visit.  Shela Leff MD [facility] Pager [phone]  If 7PM-7AM, please contact night-coverage [email or web address] Password [email or web address]  11/06/2018, 6:05 AM

## 2018-11-06 NOTE — ED Notes (Signed)
ED TO INPATIENT HANDOFF REPORT  ED Nurse Name and Phone #: [phone] Clent Ridges Name/Age/Gender Beverly Tucker 78 y.o. female Room/Bed: WA10/WA10  Code Status   Code Status: Full Code  Home/SNF/Other Home Patient oriented to: self, place, time and situation Is this baseline? Yes   Triage Complete: Triage complete  Chief Complaint Shortness of Breath  Triage Note Patient is complaining of shortness of breath and a cough. Patient was discharged from [facility] on 09/23 for breast cancer, shortness of breath with exudative plural effusion, and A-fib with RVR. Patient is currently on 2L of oxygen and appears in no acute distress. She is able to talk in full sentences.    Allergies No Known Allergies  Level of Care/Admitting Diagnosis ED Disposition    ED Disposition Condition Comment   Admit  Hospital Area: [place] [[id number]]  Level of Care: Med-Surg [16]  Covid Evaluation: Confirmed COVID Negative  Diagnosis: Pleural effusion [id number]  Admitting Physician: [place], [place]  Attending Physician: Nita Sells [id number]  Estimated length of stay: 3 - 4 days  Certification:: I certify this patient will need inpatient services for at least 2 midnights  PT Class (Do Not Modify): Inpatient [101]  PT Acc Code (Do Not Modify): Private [1]       B Medical/Surgery History Past Medical History:  Diagnosis Date  . Acute CHF (congestive heart failure) ([place]) 10/2018  . Atrial fibrillation ([place])   . Cancer (HCC)    Breast   . GERD (gastroesophageal reflux disease)   . Hypertension    Past Surgical History:  Procedure Laterality Date  . CHOLECYSTECTOMY    . IR THORACENTESIS ASP PLEURAL SPACE W/IMG GUIDE  10/22/2018     A IV Location/Drains/Wounds Patient Lines/Drains/Airways Status   Active Line/Drains/Airways    Name:   Placement date:   Placement time:   Site:   Days:   Peripheral IV Right Antecubital   -    -    Antecubital       Peripheral IV 11/05/18 Right Antecubital   11/05/18    2357    Antecubital   1   Wound / Incision (Open or Dehisced) 10/14/18 Other (Comment) Breast Right   10/14/18    1420    Breast   23          Intake/Output Last 24 hours No intake or output data in the 24 hours ending 11/06/18 1612  Labs/Imaging Results for orders placed or performed during the hospital encounter of 11/05/18 (from the past 48 hour(s))  CBC with Differential/Platelet     Status: Abnormal   Collection Time: 11/05/18 11:53 PM  Result Value Ref Range   WBC 12.7 (H) 4.0 - 10.5 K/uL   RBC 4.03 3.87 - 5.11 MIL/uL   Hemoglobin 11.5 (L) 12.0 - 15.0 g/dL   HCT 38.9 36.0 - 46.0 %   MCV 96.5 80.0 - 100.0 fL   MCH 28.5 26.0 - 34.0 pg   MCHC 29.6 (L) 30.0 - 36.0 g/dL   RDW 14.8 11.5 - 15.5 %   Platelets 347 150 - 400 K/uL   nRBC 0.0 0.0 - 0.2 %   Neutrophils Relative % 74 %   Neutro Abs 9.5 (H) 1.7 - 7.7 K/uL   Lymphocytes Relative 15 %   Lymphs Abs 2.0 0.7 - 4.0 K/uL   Monocytes Relative 7 %   Monocytes Absolute 0.9 0.1 - 1.0 K/uL   Eosinophils Relative 2 %   Eosinophils  Absolute 0.2 0.0 - 0.5 K/uL   Basophils Relative 1 %   Basophils Absolute 0.1 0.0 - 0.1 K/uL   Immature Granulocytes 1 %   Abs Immature Granulocytes 0.07 0.00 - 0.07 K/uL    Comment: Performed at [facility], [place] [address], [place], [place] [address]  Brain natriuretic peptide     Status: None   Collection Time: 11/05/18 11:53 PM  Result Value Ref Range   B Natriuretic Peptide 47.6 0.0 - 100.0 pg/mL    Comment: Performed at [facility], [place] [address], [place], [place] [address]  Comprehensive metabolic panel     Status: Abnormal   Collection Time: 11/05/18 11:53 PM  Result Value Ref Range   Sodium 138 135 - 145 mmol/L   Potassium 4.1 3.5 - 5.1 mmol/L   Chloride 99 98 - 111 mmol/L   CO2 29 22 - 32 mmol/L   Glucose, Bld 108 (H) 70 - 99 mg/dL   BUN 24 (H) 8 - 23 mg/dL   Creatinine, Ser 1.37 (H)  0.44 - 1.00 mg/dL   Calcium 9.0 8.9 - 10.3 mg/dL   Total Protein 7.3 6.5 - 8.1 g/dL   Albumin 3.7 3.5 - 5.0 g/dL   AST 21 15 - 41 U/L   ALT 26 0 - 44 U/L   Alkaline Phosphatase 80 38 - 126 U/L   Total Bilirubin 0.4 0.3 - 1.2 mg/dL   GFR calc non Af Amer 37 (L) >60 mL/min   GFR calc Af Amer 42 (L) >60 mL/min   Anion gap 10 5 - 15    Comment: Performed at [facility], [place] [address], [place], [place] [address]  SARS CORONAVIRUS 2 (TAT 6-24 HRS) Nasopharyngeal Nasopharyngeal Swab     Status: None   Collection Time: 11/05/18 11:53 PM   Specimen: Nasopharyngeal Swab  Result Value Ref Range   SARS Coronavirus 2 NEGATIVE NEGATIVE    Comment: (NOTE) SARS-CoV-2 target nucleic acids are NOT DETECTED. The SARS-CoV-2 RNA is generally detectable in upper and lower respiratory specimens during the acute phase of infection. Negative results do not preclude SARS-CoV-2 infection, do not rule out co-infections with other pathogens, and should not be used as the sole basis for treatment or other patient management decisions. Negative results must be combined with clinical observations, patient history, and epidemiological information. The expected result is Negative. Fact Sheet for Patients: SugarRoll.be Fact Sheet for Healthcare Providers: https://www.woods-mathews.com/ This test is not yet approved or cleared by the [place] FDA and  has been authorized for detection and/or diagnosis of SARS-CoV-2 by FDA under an Emergency Use Authorization (EUA). This EUA will remain  in effect (meaning this test can be used) for the duration of the COVID-19 declaration under Section 56 4(b)(1) of the Act, 21 U.S.C. section 360bbb-3(b)(1), unless the authorization is terminated or revoked sooner. Performed at [facility], [place] [address], [place], [place] [id number]   Basic metabolic panel     Status: Abnormal   Collection Time: 11/06/18   5:39 AM  Result Value Ref Range   Sodium 140 135 - 145 mmol/L   Potassium 4.0 3.5 - 5.1 mmol/L   Chloride 99 98 - 111 mmol/L   CO2 30 22 - 32 mmol/L   Glucose, Bld 107 (H) 70 - 99 mg/dL   BUN 24 (H) 8 - 23 mg/dL   Creatinine, Ser 1.27 (H) 0.44 - 1.00 mg/dL   Calcium 8.7 (L) 8.9 - 10.3  mg/dL   GFR calc non Af Amer 40 (L) >60 mL/min   GFR calc Af Amer 46 (L) >60 mL/min   Anion gap 11 5 - 15    Comment: Performed at [facility], [place] [address], [place], [place] [address]   Dg Chest 1 View  Result Date: 11/06/2018 CLINICAL DATA:  Post left-sided thoracentesis. EXAM: CHEST  1 VIEW COMPARISON:  Radiographs 11/05/2018 and 10/22/2018. FINDINGS: 1344 hours. The left pleural effusion has decreased in volume. There is improved aeration of the left lung base. There is a stable small right pleural effusion and mild right basilar atelectasis. The heart size and mediastinal contours are stable with aortic atherosclerosis. No pneumothorax. IMPRESSION: 1. Decreased left pleural effusion and improved aeration of the left lung base status post thoracentesis. No pneumothorax. 2. Stable small right pleural effusion and right basilar atelectasis. Electronically Signed   By: Richardean Sale M.D.   On: 11/06/2018 14:16   Dg Chest 2 View  Result Date: 11/05/2018 CLINICAL DATA:  Shortness of breath EXAM: CHEST - 2 VIEW COMPARISON:  October 22, 2018 FINDINGS: There is mild cardiomegaly. A moderate to large left pleural effusion is seen. There is trace right pleural effusion. Mildly increased interstitial markings seen predominantly at the right lung base, likely chronic lung changes. Air not calcifications. IMPRESSION: Moderate to large left and trace right pleural effusion. Electronically Signed   By: Prudencio Pair M.D.   On: 11/05/2018 22:09   US Thoracentesis Asp Pleural Space W/img Guide  Result Date: 11/06/2018 INDICATION: Patient with history of CHF, chronic kidney disease, dyspnea,  metastatic breast cancer, recurrent left pleural effusion. Request made for therapeutic left thoracentesis. EXAM: ULTRASOUND GUIDED THERAPEUTIC LEFT THORACENTESIS MEDICATIONS: None COMPLICATIONS: None immediate. PROCEDURE: An ultrasound guided thoracentesis was thoroughly discussed with the patient and questions answered. The benefits, risks, alternatives and complications were also discussed. The patient understands and wishes to proceed with the procedure. Written consent was obtained. Ultrasound was performed to localize and mark an adequate pocket of fluid in the left chest. The area was then prepped and draped in the normal sterile fashion. 1% Lidocaine was used for local anesthesia. Under ultrasound guidance a 6 Fr Safe-T-Centesis catheter was introduced. Thoracentesis was performed. The catheter was removed and a dressing applied. FINDINGS: A total of approximately 900 cc of amber/blood-tinged fluid was removed. Due to persistent patient coughing only the above amount of fluid was removed today. IMPRESSION: Successful ultrasound guided therapeutic left thoracentesis yielding 900 cc of pleural fluid. Read by: Rowe Robert, PA-C Electronically Signed   By: Jacqulynn Cadet M.D.   On: 11/06/2018 14:11    Pending Labs Unresulted Labs (From admission, onward)    Start     Ordered   11/06/18 0825  Urinalysis, Routine w reflex microscopic  ONCE - STAT,   STAT     11/06/18 0824          Vitals/Pain Today's Vitals   11/06/18 1323 11/06/18 1330 11/06/18 1345 11/06/18 1346  BP: 117/73 (!) 112/97 (!) 129/55 (!) 129/55  Pulse:      Resp:      Temp:      TempSrc:      SpO2:      Weight:      Height:      PainSc:        Isolation Precautions No active isolations  Medications Medications  metoprolol tartrate (LOPRESSOR) tablet 25 mg (25 mg Oral Given 11/06/18 1029)  ALPRAZolam (XANAX) tablet 0.25-0.5  mg (has no administration in time range)  acetaminophen (TYLENOL) tablet 650 mg (has no  administration in time range)    Or  acetaminophen (TYLENOL) suppository 650 mg (has no administration in time range)  0.9 %  sodium chloride infusion ( Intravenous Rate/Dose Change 11/06/18 0849)  lidocaine (XYLOCAINE) 1 % (with pres) injection (has no administration in time range)    Mobility walks with person assist Low fall risk   Focused Assessments    R Recommendations: See Admitting Provider Note  Report given to:   Additional Notes:

## 2018-11-06 NOTE — Procedures (Addendum)
Ultrasound-guided  therapeutic left thoracentesis performed yielding 900 cc of amber/blood-tinged fluid. No immediate complications. Follow-up chest x-ray pending. EBL < 1cc. Due to persistent pt coughing only the above amount of fluid was removed today. If pleurx cath desired in future pt would need to remain off eliquis for 48 hrs before procedure; recommend consulting with oncology before placing pleurx.

## 2018-11-06 NOTE — Progress Notes (Signed)
Patient seen and examined and agree with plan of care as per my partner who admitted this patient this morning    [name] recent admitted diagnosed with R metastatic breast cancer-biopsied 9/16 adenocarcinoma-5.7 X3.5 X4.9R mass with fungating tumor-?  Enhancement central cord MRI deferred secondary to patient preference-underwent thoracentesis x2, first one 9/15 =900 cc second one 9/22 =822 cc--echo did not show diastolic heart failure Complications of A. fib RVR during hospital stay started on metoprolol Cardizem apixaban  Returns to the hospital as per my partner's note with shortness of breath-CXR = moderate to large left pleural effusion  Patient also is complaining of some burning in her urine over the past 1 to 2 days and is asking if we can tested for urinary infection She states that she was taking Lasix but I do not see it on her MAR so I do not know if she is confused and whether she is actually taking this or not  She looks comfortable on oxygen, she does not have any accessory muscle use and she seems to be mentating fairly well She tells me that she was supposed to have started Femara and Verzenio in the next day or so and she was told based on her visit 2 days ago with her oncologist that these medications would help control the pleural effusion  I have put out a call to Dr. [name] to discuss whether we should consider a Pleurx drain--as I have not heard back from him as yet, I have asked IR to consider this when they evaluate her for thoracentesis  She remains stable at this time and can go to telemetry bed when stable

## 2018-11-06 NOTE — ED Notes (Signed)
Pt is alert and oriented x 4 and is verbally responsive. Pt denies pain at this time. Pt is speaking with husband on the phone at this time.

## 2018-11-06 NOTE — ED Notes (Signed)
Per MD: Pt may not eat until IR has evaluated. Pt aware

## 2018-11-07 ENCOUNTER — Inpatient Hospital Stay (HOSPITAL_COMMUNITY): Payer: Medicare HMO

## 2018-11-07 ENCOUNTER — Telehealth: Payer: Self-pay | Admitting: *Deleted

## 2018-11-07 DIAGNOSIS — J910 Malignant pleural effusion: Secondary | ICD-10-CM

## 2018-11-07 DIAGNOSIS — I10 Essential (primary) hypertension: Secondary | ICD-10-CM

## 2018-11-07 DIAGNOSIS — C50911 Malignant neoplasm of unspecified site of right female breast: Secondary | ICD-10-CM

## 2018-11-07 DIAGNOSIS — I482 Chronic atrial fibrillation, unspecified: Secondary | ICD-10-CM

## 2018-11-07 DIAGNOSIS — J90 Pleural effusion, not elsewhere classified: Secondary | ICD-10-CM

## 2018-11-07 DIAGNOSIS — C50919 Malignant neoplasm of unspecified site of unspecified female breast: Secondary | ICD-10-CM

## 2018-11-07 DIAGNOSIS — I5033 Acute on chronic diastolic (congestive) heart failure: Secondary | ICD-10-CM

## 2018-11-07 DIAGNOSIS — C7951 Secondary malignant neoplasm of bone: Principal | ICD-10-CM

## 2018-11-07 DIAGNOSIS — N189 Chronic kidney disease, unspecified: Secondary | ICD-10-CM

## 2018-11-07 LAB — BASIC METABOLIC PANEL
Anion gap: 9 (ref 5–15)
BUN: 22 mg/dL (ref 8–23)
CO2: 29 mmol/L (ref 22–32)
Calcium: 8.6 mg/dL — ABNORMAL LOW (ref 8.9–10.3)
Chloride: 100 mmol/L (ref 98–111)
Creatinine, Ser: 1.17 mg/dL — ABNORMAL HIGH (ref 0.44–1.00)
GFR calc Af Amer: 51 mL/min — ABNORMAL LOW (ref >60)
GFR calc non Af Amer: 44 mL/min — ABNORMAL LOW (ref >60)
Glucose, Bld: 101 mg/dL — ABNORMAL HIGH (ref 70–99)
Potassium: 4.1 mmol/L (ref 3.5–5.1)
Sodium: 138 mmol/L (ref 135–145)

## 2018-11-07 LAB — CBC WITH DIFFERENTIAL/PLATELET
Abs Immature Granulocytes: 0.05 10*3/uL (ref 0.00–0.07)
Basophils Absolute: 0.1 10*3/uL (ref 0.0–0.1)
Basophils Relative: 1 %
Eosinophils Absolute: 0.2 10*3/uL (ref 0.0–0.5)
Eosinophils Relative: 2 %
HCT: 33.3 % — ABNORMAL LOW (ref 36.0–46.0)
Hemoglobin: 9.8 g/dL — ABNORMAL LOW (ref 12.0–15.0)
Immature Granulocytes: 1 %
Lymphocytes Relative: 15 %
Lymphs Abs: 1.3 10*3/uL (ref 0.7–4.0)
MCH: 28.2 pg (ref 26.0–34.0)
MCHC: 29.4 g/dL — ABNORMAL LOW (ref 30.0–36.0)
MCV: 95.7 fL (ref 80.0–100.0)
Monocytes Absolute: 0.7 10*3/uL (ref 0.1–1.0)
Monocytes Relative: 8 %
Neutro Abs: 6.2 10*3/uL (ref 1.7–7.7)
Neutrophils Relative %: 73 %
Platelets: 259 10*3/uL (ref 150–400)
RBC: 3.48 MIL/uL — ABNORMAL LOW (ref 3.87–5.11)
RDW: 14.7 % (ref 11.5–15.5)
WBC: 8.6 10*3/uL (ref 4.0–10.5)
nRBC: 0 % (ref 0.0–0.2)

## 2018-11-07 LAB — URINALYSIS, ROUTINE W REFLEX MICROSCOPIC
Bilirubin Urine: NEGATIVE
Glucose, UA: NEGATIVE mg/dL
Ketones, ur: NEGATIVE mg/dL
Nitrite: POSITIVE — AB
Protein, ur: NEGATIVE mg/dL
Specific Gravity, Urine: 1.013 (ref 1.005–1.030)
WBC, UA: >50 WBC/hpf — ABNORMAL HIGH (ref 0–5)
pH: 6 (ref 5.0–8.0)

## 2018-11-07 LAB — APTT
aPTT: 33 seconds (ref 24–36)
aPTT: 48 seconds — ABNORMAL HIGH (ref 24–36)

## 2018-11-07 LAB — PROTIME-INR
INR: 1.1 (ref 0.8–1.2)
Prothrombin Time: 13.9 seconds (ref 11.4–15.2)

## 2018-11-07 LAB — HEPARIN LEVEL (UNFRACTIONATED): Heparin Unfractionated: 0.91 IU/mL — ABNORMAL HIGH (ref 0.30–0.70)

## 2018-11-07 MED ORDER — CEFAZOLIN SODIUM-DEXTROSE 2-4 GM/100ML-% IV SOLN
2.0000 g | INTRAVENOUS | Status: AC
  Administered 2018-11-08: 08:00:00 2 g via INTRAVENOUS

## 2018-11-07 MED ORDER — HEPARIN (PORCINE) 25000 UT/250ML-% IV SOLN
1000.0000 [IU]/h | INTRAVENOUS | Status: DC
  Administered 2018-11-07: 15:00:00 900 [IU]/h via INTRAVENOUS
  Filled 2018-11-07 (×2): qty 250

## 2018-11-07 NOTE — Progress Notes (Signed)
Hospitalist progress note  Beverly Tucker  [id number] DOB: Nov 14, 1940 DOA: 11/05/2018 PCP: [name], MD  Narrative:  56 WF recent admitted diagnosed with R metastatic breast cancer-biopsied 9/16 adenocarcinoma-5.7 X3.5 X4.9R mass with fungating tumor-?  Enhancement central cord MRI deferred secondary to patient preference-underwent thoracentesis x2, first one 9/15 =900 cc second one 9/22 =822 cc--echo did not show diastolic heart failure Complications of A. fib RVR during hospital stay started on metoprolol Cardizem apixaban  Returns to the hospital-shortness of breath-CXR = moderate to large left pleural effusion  Assessment & Plan: Recurrent pleural effusion-thoracentesis 10/7-my review of x-ray 10/8 shows some accumulation-repeat chest x-ray 10/9, IR consulted for Pleurx catheter drainage after my discussion with Dr. [name] of oncology who okays this and feels that in either event the chemotherapy used that may help desiccate pleural effusion will take several months to occur--get desat screen but we may probably need to send her back home again with oxygen  Metastatic right breast cancer/fungating-await start of chemo as outpatient looks like approval has been performed  Atrial fibrillation on Eliquis Eliquis has not been resumed since admission we will place on low-dose heparin without bolus and await further input from IR-continue metoprolol 25 twice daily she is rate controlled  AKI on admission now resolved and seems improved holding IV fluids at this time  HTN holding lisinopril/HCTZ continue meds as above  DVT prophylaxis: Heparin per pharmacy code Status:   Presumed full   family Communication:   Spoke with son [name] and updated Disposition Plan: inpatient pending resolution  Consultants:   IR   Oncology spoke with Dr. [name]  Procedures:   Thoracentesis 10/7  Antimicrobials:   none   Subjective: Awake feels better, no fever -chills -n -v -diarr    Objective: Vitals:   11/06/18 1346 11/06/18 1641 11/06/18 2058 11/07/18 0506  BP: (!) 129/55 (!) 139/54 109/60 (!) 117/53  Pulse:  76 76 68  Resp:  20 18 19   Temp:  98.4 F (36.9 C) 98.9 F (37.2 C) 98.4 F (36.9 C)  TempSrc:   Oral Oral  SpO2:  98% 97% 95%  Weight:      Height:        Intake/Output Summary (Last 24 hours) at 11/07/2018 1002 Last data filed at 11/06/2018 1800 Gross per 24 hour  Intake 878.48 ml  Output -  Net 878.48 ml   Filed Weights   11/05/18 2117  Weight: 84.4 kg    Examination: eomi ncat no pallor no ict cta b no added sound abd soft nt nd  s1 s2 no m/r/g Neuro intact and moving x 4 limbs without deficit  Data Reviewed: I have personally reviewed following labs and imaging studies  CBC: Recent Labs  Lab 11/05/18 2353 11/07/18 0524  WBC 12.7* 8.6  NEUTROABS 9.5* 6.2  HGB 11.5* 9.8*  HCT 38.9 33.3*  MCV 96.5 95.7  PLT 347 [id number]   Basic Metabolic Panel: Recent Labs  Lab 11/05/18 2353 11/06/18 0539 11/07/18 0524  NA 138 140 138  K 4.1 4.0 4.1  CL 99 99 100  CO2 29 30 29   GLUCOSE 108* 107* 101*  BUN 24* 24* 22  CREATININE 1.37* 1.27* 1.17*  CALCIUM 9.0 8.7* 8.6*   GFR: Estimated Creatinine Clearance: 42.7 mL/min (A) (by C-G formula based on SCr of 1.17 mg/dL (H)). Liver Function Tests: Recent Labs  Lab 11/05/18 2353  AST 21  ALT 26  ALKPHOS 80  BILITOT 0.4  PROT 7.3  ALBUMIN  3.7   No results for input(s): LIPASE, AMYLASE in the last 168 hours. No results for input(s): AMMONIA in the last 168 hours. Coagulation Profile: No results for input(s): INR, PROTIME in the last 168 hours. Cardiac Enzymes:  Radiology Studies: Reviewed images personally in health database   Scheduled Meds: . metoprolol tartrate  25 mg Oral BID   Continuous Infusions:   LOS: 1 day  Time spent: [place], MD [facility]  If 7PM-7AM, please contact night-coverage-look on AMION to find my number otherwise-prefer pages-not epic  chat,please 11/07/2018, 10:02 AM

## 2018-11-07 NOTE — Telephone Encounter (Signed)
Late entry for 11/06/2018: Received voice mail from Jacklynn Bue, patient's son. He requested a call back stating he had several questions related to his mother's appointment with Dr. [name] 11/04/2018.  Attempted to contact him at number provided - left message to contact Dr.Kale's office at his convenience.

## 2018-11-07 NOTE — Progress Notes (Addendum)
ANTICOAGULATION CONSULT NOTE - Initial Consult  Pharmacy Consult for heparin Indication: atrial fibrillation  No Known Allergies  Patient Measurements: Height: 5\' 6"  (167.6 cm) Weight: 186 lb (84.4 kg) IBW/kg (Calculated) : 59.3 Heparin Dosing Weight: 77 kg  Vital Signs: Temp: 98.5 F (36.9 C) (10/08 1020) Temp Source: Oral (10/08 0506) BP: 157/66 (10/08 1020) Pulse Rate: 79 (10/08 1021)  Labs: Recent Labs    11/05/18 2353 11/06/18 0539 11/07/18 0524  HGB 11.5*  --  9.8*  HCT 38.9  --  33.3*  PLT 347  --  259  CREATININE 1.37* 1.27* 1.17*    Estimated Creatinine Clearance: 42.7 mL/min (A) (by C-G formula based on SCr of 1.17 mg/dL (H)).   Medical History: Past Medical History:  Diagnosis Date  . Acute CHF (congestive heart failure) ([place]) 10/2018  . Atrial fibrillation ([place])   . Cancer (HCC)    Breast   . GERD (gastroesophageal reflux disease)   . Hypertension     Assessment: Pharmacy consulted to dose/monitor heparin for atrial fibrillation. Pt was taking apixaban PTA for afib, last dose on 10/6 PM - has been held during admission pending thoracentesis which was done on 10/7. Pharmacy consulted to resume heparin today with no bolus.  Significant Events: -Last apixaban dose: 10/6 PM -Thoracentesis: 10/7  Today, 11/07/18  Baseline Hgb 9.8  Plt 259 - WNL  SCr 1.17, CrCl 43 mL/min  Baseline HL 0.91 falsely elevated as a reflection of DOAC PTA  Baseline aPTT: 33, INR 1.1  Goal of Therapy:  -Heparin level 0.3-0.7 units/ml; consider targeting lower end of therapeutic range 0.3-0.5 as MD note mentions starting low dose heparin without bolus -aPTT 66-102 seconds, consider targeting lower end of therapeutic range (66-84 seconds) Monitor platelets by anticoagulation protocol: Yes   Plan:   No bolus per consult orders  Start heparin infusion at 900 units/hr  Check aPTT level in 8 hours and daily while on heparin  Check aPTT and HL daily and once  correlate, can transition to using the HL for monitoring  Continue to monitor H&H and platelets  Monitor for signs/symptoms of bleeding or thrombosis  Lenis Noon, PharmD 11/07/2018,1:18 PM

## 2018-11-07 NOTE — Plan of Care (Signed)

## 2018-11-07 NOTE — Progress Notes (Signed)
Referring Physician(s): Kale,G/Samtani,J  Supervising Physician: Jacqulynn Cadet  Patient Status:  [facility] - In-pt  Chief Complaint:  Metastatic breast cancer, recurrent malignant left pleural effusion, dyspnea  Subjective: Patient known to IR service from prior left thoracenteses on 10/22/2018 and 11/06/2018.  She has a history of metastatic breast cancer with recurrent malignant left pleural effusion, chronic kidney disease, paroxysmal atrial fibrillation on Eliquis, congestive heart failure, hypertension, GERD.  She is currently on IV heparin.  Request now received for left Pleurx catheter placement.  Currently denies fever, headache, chest pain, abdominal/back pain, nausea, vomiting or bleeding.  She does have dyspnea and cough.  Past Medical History:  Diagnosis Date  . Acute CHF (congestive heart failure) ([place]) 10/2018  . Atrial fibrillation ([place])   . Cancer (HCC)    Breast   . GERD (gastroesophageal reflux disease)   . Hypertension    Past Surgical History:  Procedure Laterality Date  . CHOLECYSTECTOMY    . IR THORACENTESIS ASP PLEURAL SPACE W/IMG GUIDE  10/22/2018     Allergies: Patient has no known allergies.  Medications: Prior to Admission medications   Medication Sig Start Date End Date Taking? Authorizing Provider  abemaciclib (VERZENIO) 50 MG tablet Take 1 tablet (50 mg total) by mouth 2 (two) times daily. Swallow tablets whole. Do not chew, crush, or split tablets before swallowing. 11/04/18  Yes Brunetta Genera, MD  ALPRAZolam Duanne Moron) 0.5 MG tablet Take 0.25-0.5 mg by mouth 2 (two) times daily as needed for anxiety. 05/31/17  Yes [provider]  apixaban (ELIQUIS) 5 MG TABS tablet Take 1 tablet (5 mg total) by mouth 2 (two) times daily. 10/23/18  Yes Jeanmarie Hubert, MD  ergocalciferol (VITAMIN D2) 1.25 MG (50000 UT) capsule Take 1 capsule (50,000 Units total) by mouth once a week. 11/04/18  Yes Brunetta Genera, MD  letrozole [facility]) 2.5 MG  tablet Take 1 tablet (2.5 mg total) by mouth daily. 11/04/18  Yes Brunetta Genera, MD  lisinopril-hydrochlorothiazide (ZESTORETIC) 10-12.5 MG tablet Take 1 tablet by mouth daily. 10/08/18  Yes [provider]  metoprolol tartrate (LOPRESSOR) 25 MG tablet Take 1 tablet (25 mg total) by mouth 2 (two) times daily. 10/23/18  Yes Jeanmarie Hubert, MD     Vital Signs: BP (!) 122/59   Pulse 74   Temp 98.4 F (36.9 C)   Resp 17   Ht 5\' 6"  (1.676 m)   Wt 186 lb (84.4 kg)   SpO2 97%   BMI 30.02 kg/m   Physical Exam awake, alert.  Chest with diminished breath sounds bases, greater on left; heart-RRR; abd-soft ,positive bowel sounds, nontender.  Bilateral pretibial edema noted.  Trace to 1+.  Imaging: Dg Chest 1 View  Result Date: 11/06/2018 CLINICAL DATA:  Post left-sided thoracentesis. EXAM: CHEST  1 VIEW COMPARISON:  Radiographs 11/05/2018 and 10/22/2018. FINDINGS: 1344 hours. The left pleural effusion has decreased in volume. There is improved aeration of the left lung base. There is a stable small right pleural effusion and mild right basilar atelectasis. The heart size and mediastinal contours are stable with aortic atherosclerosis. No pneumothorax. IMPRESSION: 1. Decreased left pleural effusion and improved aeration of the left lung base status post thoracentesis. No pneumothorax. 2. Stable small right pleural effusion and right basilar atelectasis. Electronically Signed   By: Richardean Sale M.D.   On: 11/06/2018 14:16   Dg Chest 2 View  Result Date: 11/07/2018 CLINICAL DATA:  Left thoracentesis yesterday EXAM: CHEST - 2 VIEW COMPARISON:  11/06/2018 FINDINGS: Moderate left pleural effusion appears slightly larger. No pneumothorax. Consolidation left lower lobe shows progression Small right effusion and mild right lower lobe airspace disease unchanged. Negative for heart failure or edema. IMPRESSION: Progression of left effusion and left lower lobe consolidation following left  thoracentesis. No pneumothorax. Electronically Signed   By: Franchot Gallo M.D.   On: 11/07/2018 09:53   Dg Chest 2 View  Result Date: 11/05/2018 CLINICAL DATA:  Shortness of breath EXAM: CHEST - 2 VIEW COMPARISON:  October 22, 2018 FINDINGS: There is mild cardiomegaly. A moderate to large left pleural effusion is seen. There is trace right pleural effusion. Mildly increased interstitial markings seen predominantly at the right lung base, likely chronic lung changes. Air not calcifications. IMPRESSION: Moderate to large left and trace right pleural effusion. Electronically Signed   By: Prudencio Pair M.D.   On: 11/05/2018 22:09   US Thoracentesis Asp Pleural Space W/img Guide  Result Date: 11/06/2018 INDICATION: Patient with history of CHF, chronic kidney disease, dyspnea, metastatic breast cancer, recurrent left pleural effusion. Request made for therapeutic left thoracentesis. EXAM: ULTRASOUND GUIDED THERAPEUTIC LEFT THORACENTESIS MEDICATIONS: None COMPLICATIONS: None immediate. PROCEDURE: An ultrasound guided thoracentesis was thoroughly discussed with the patient and questions answered. The benefits, risks, alternatives and complications were also discussed. The patient understands and wishes to proceed with the procedure. Written consent was obtained. Ultrasound was performed to localize and mark an adequate pocket of fluid in the left chest. The area was then prepped and draped in the normal sterile fashion. 1% Lidocaine was used for local anesthesia. Under ultrasound guidance a 6 Fr Safe-T-Centesis catheter was introduced. Thoracentesis was performed. The catheter was removed and a dressing applied. FINDINGS: A total of approximately 900 cc of amber/blood-tinged fluid was removed. Due to persistent patient coughing only the above amount of fluid was removed today. IMPRESSION: Successful ultrasound guided therapeutic left thoracentesis yielding 900 cc of pleural fluid. Read by: Rowe Robert, PA-C  Electronically Signed   By: Jacqulynn Cadet M.D.   On: 11/06/2018 14:11    Labs:  CBC: Recent Labs    10/22/18 0258 10/23/18 0226 11/05/18 2353 11/07/18 0524  WBC 10.9* 11.0* 12.7* 8.6  HGB 10.5* 10.6* 11.5* 9.8*  HCT 34.4* 33.5* 38.9 33.3*  PLT 291 291 347 259    COAGS: Recent Labs    10/15/18 0130 11/07/18 1325  INR 1.1 1.1  APTT 31 33    BMP: Recent Labs    10/20/18 0303 11/05/18 2353 11/06/18 0539 11/07/18 0524  NA 137 138 140 138  K 3.5 4.1 4.0 4.1  CL 97* 99 99 100  CO2 30 29 30 29   GLUCOSE 114* 108* 107* 101*  BUN 10 24* 24* 22  CALCIUM 8.3* 9.0 8.7* 8.6*  CREATININE 0.96 1.37* 1.27* 1.17*  GFRNONAA 56* 37* 40* 44*  GFRAA >60 42* 46* 51*    LIVER FUNCTION TESTS: Recent Labs    10/05/18 1837 10/14/18 1440 10/15/18 0130 11/05/18 2353  BILITOT 0.6 0.7 0.7 0.4  AST 20 20 19 21   ALT 20 16 16 26   ALKPHOS 82 97 96 80  PROT 6.5 6.6 6.2*  6.4* 7.3  ALBUMIN 3.3* 3.3* 3.0* 3.7    Assessment and Plan: Pt with history of metastatic breast cancer with recurrent malignant left pleural effusion, chronic kidney disease, paroxysmal atrial fibrillation on Eliquis, congestive heart failure, hypertension, GERD.  She is currently on IV heparin.  Last thoracentesis on 10/7 yielded 900 cc . Request now received  for left Pleurx catheter placement.  Case has been reviewed by Dr. Laurence Ferrari.  Details/risks of procedure, including but not limited to, internal bleeding, infection, pneumothorax, injury to adjacent structures,  inability to adequately drain pleural fluid discussed with patient with her understanding and consent.  Procedure planned for tomorrow a.m.  IV heparin will be stopped at 0500 10/9.   Electronically Signed: D. Rowe Robert, PA-C 11/07/2018, 5:54 PM   I spent a total of 25 minutes at the the patient's bedside AND on the patient's hospital floor or unit, greater than 50% of which was counseling/coordinating care for left Pleurx catheter placement     Patient ID: Beverly Tucker, female   DOB: [birth year], 78 y.o.   MRN: [id number]

## 2018-11-08 ENCOUNTER — Inpatient Hospital Stay (HOSPITAL_COMMUNITY): Payer: Medicare HMO

## 2018-11-08 ENCOUNTER — Encounter (HOSPITAL_COMMUNITY): Payer: Self-pay | Admitting: Interventional Radiology

## 2018-11-08 ENCOUNTER — Ambulatory Visit (HOSPITAL_COMMUNITY): Payer: Medicare HMO

## 2018-11-08 DIAGNOSIS — J910 Malignant pleural effusion: Secondary | ICD-10-CM

## 2018-11-08 HISTORY — PX: IR PERC PLEURAL DRAIN W/INDWELL CATH W/IMG GUIDE: IMG5383

## 2018-11-08 LAB — BASIC METABOLIC PANEL
Anion gap: 11 (ref 5–15)
BUN: 19 mg/dL (ref 8–23)
CO2: 30 mmol/L (ref 22–32)
Calcium: 8.7 mg/dL — ABNORMAL LOW (ref 8.9–10.3)
Chloride: 96 mmol/L — ABNORMAL LOW (ref 98–111)
Creatinine, Ser: 1.12 mg/dL — ABNORMAL HIGH (ref 0.44–1.00)
GFR calc Af Amer: 54 mL/min — ABNORMAL LOW (ref >60)
GFR calc non Af Amer: 47 mL/min — ABNORMAL LOW (ref >60)
Glucose, Bld: 111 mg/dL — ABNORMAL HIGH (ref 70–99)
Potassium: 4 mmol/L (ref 3.5–5.1)
Sodium: 137 mmol/L (ref 135–145)

## 2018-11-08 LAB — RENAL FUNCTION PANEL
Albumin: 2.8 g/dL — ABNORMAL LOW (ref 3.5–5.0)
Anion gap: 11 (ref 5–15)
BUN: 18 mg/dL (ref 8–23)
CO2: 30 mmol/L (ref 22–32)
Calcium: 8.7 mg/dL — ABNORMAL LOW (ref 8.9–10.3)
Chloride: 96 mmol/L — ABNORMAL LOW (ref 98–111)
Creatinine, Ser: 1.14 mg/dL — ABNORMAL HIGH (ref 0.44–1.00)
GFR calc Af Amer: 53 mL/min — ABNORMAL LOW (ref >60)
GFR calc non Af Amer: 46 mL/min — ABNORMAL LOW (ref >60)
Glucose, Bld: 110 mg/dL — ABNORMAL HIGH (ref 70–99)
Phosphorus: 3.7 mg/dL (ref 2.5–4.6)
Potassium: 4 mmol/L (ref 3.5–5.1)
Sodium: 137 mmol/L (ref 135–145)

## 2018-11-08 LAB — HEPARIN LEVEL (UNFRACTIONATED)
Heparin Unfractionated: 0.22 IU/mL — ABNORMAL LOW (ref 0.30–0.70)
Heparin Unfractionated: 0.33 IU/mL (ref 0.30–0.70)

## 2018-11-08 LAB — APTT
aPTT: 31 seconds (ref 24–36)
aPTT: 54 seconds — ABNORMAL HIGH (ref 24–36)

## 2018-11-08 MED ORDER — CEFAZOLIN SODIUM-DEXTROSE 2-4 GM/100ML-% IV SOLN
INTRAVENOUS | Status: AC
  Administered 2018-11-08: 08:00:00 2 g via INTRAVENOUS
  Filled 2018-11-08: qty 100

## 2018-11-08 MED ORDER — FENTANYL CITRATE (PF) 100 MCG/2ML IJ SOLN
INTRAMUSCULAR | Status: AC | PRN
  Administered 2018-11-08: 50 ug via INTRAVENOUS

## 2018-11-08 MED ORDER — TRAMADOL HCL 50 MG PO TABS
50.0000 mg | ORAL_TABLET | Freq: Four times a day (QID) | ORAL | Status: DC | PRN
  Administered 2018-11-08 – 2018-11-09 (×4): 50 mg via ORAL
  Filled 2018-11-08 (×4): qty 1

## 2018-11-08 MED ORDER — FENTANYL CITRATE (PF) 100 MCG/2ML IJ SOLN
INTRAMUSCULAR | Status: AC
  Filled 2018-11-08: qty 2

## 2018-11-08 MED ORDER — LIDOCAINE HCL 1 % IJ SOLN
INTRAMUSCULAR | Status: AC
  Filled 2018-11-08: qty 20

## 2018-11-08 MED ORDER — MIDAZOLAM HCL 2 MG/2ML IJ SOLN
INTRAMUSCULAR | Status: AC
  Filled 2018-11-08: qty 2

## 2018-11-08 MED ORDER — MIDAZOLAM HCL 2 MG/2ML IJ SOLN
INTRAMUSCULAR | Status: AC | PRN
  Administered 2018-11-08: 1 mg via INTRAVENOUS
  Administered 2018-11-08: 0.5 mg via INTRAVENOUS

## 2018-11-08 MED ORDER — MORPHINE SULFATE (PF) 2 MG/ML IV SOLN
1.0000 mg | INTRAVENOUS | Status: DC | PRN
  Administered 2018-11-08 – 2018-11-09 (×2): 1 mg via INTRAVENOUS
  Filled 2018-11-08 (×3): qty 1

## 2018-11-08 MED ORDER — HEPARIN (PORCINE) 25000 UT/250ML-% IV SOLN
1000.0000 [IU]/h | INTRAVENOUS | Status: DC
  Administered 2018-11-08: 1000 [IU]/h via INTRAVENOUS
  Filled 2018-11-08: qty 250

## 2018-11-08 MED ORDER — SODIUM CHLORIDE 0.9 % IV SOLN
1.0000 g | INTRAVENOUS | Status: DC
  Administered 2018-11-08 – 2018-11-10 (×3): 1 g via INTRAVENOUS
  Filled 2018-11-08 (×3): qty 1

## 2018-11-08 MED ORDER — HEPARIN (PORCINE) 25000 UT/250ML-% IV SOLN
1100.0000 [IU]/h | INTRAVENOUS | Status: DC
  Administered 2018-11-09: 1100 [IU]/h via INTRAVENOUS
  Filled 2018-11-08: qty 250

## 2018-11-08 NOTE — Telephone Encounter (Signed)
Oral Oncology Patient Advocate Encounter  Melynda Keller is still at the pharmacy ready for pick up.  I called the patients son, [name] to follow up with them and had to leave a voicemail.  [place] [facility] Phone [phone] Fax [phone] 11/08/2018   1:09 PM

## 2018-11-08 NOTE — Progress Notes (Signed)
ANTICOAGULATION CONSULT NOTE - Follow Up Consult  Pharmacy Consult for Heparin Indication: atrial fibrillation  No Known Allergies  Patient Measurements: Height: 5\' 6"  (167.6 cm) Weight: 186 lb (84.4 kg) IBW/kg (Calculated) : 59.3 Heparin Dosing Weight:   Vital Signs: Temp: 99.6 F (37.6 C) (10/08 1938) BP: 152/71 (10/08 1938) Pulse Rate: 80 (10/08 1938)  Labs: Recent Labs    11/05/18 2353 11/06/18 0539 11/07/18 0524 11/07/18 1325 11/07/18 1326 11/07/18 2223  HGB 11.5*  --  9.8*  --   --   --   HCT 38.9  --  33.3*  --   --   --   PLT 347  --  259  --   --   --   APTT  --   --   --  33  --  48*  LABPROT  --   --   --  13.9  --   --   INR  --   --   --  1.1  --   --   HEPARINUNFRC  --   --   --   --  0.91*  --   CREATININE 1.37* 1.27* 1.17*  --   --   --     Estimated Creatinine Clearance: 42.7 mL/min (A) (by C-G formula based on SCr of 1.17 mg/dL (H)).   Medications:  Infusions:  .  ceFAZolin (ANCEF) IV    . heparin 1,000 Units/hr (11/07/18 2353)    Assessment: Patient with low PTT.  No heparin issue noted.  PTT ordered with Heparin level until both correlate due to possible drug-lab interaction between oral anticoagulant (rivaroxaban, edoxaban, or apixaban) and anti-Xa level (aka heparin level)   Goal of Therapy:  Heparin level 0.3-0.7 units/ml aPTT 66-102 seconds Monitor platelets by anticoagulation protocol: Yes   Plan:  Increase heparin to 1000 units/hr Recheck PTT and heparin level at 0800  Tyler Deis, Shea Stakes [place] 11/08/2018,3:23 AM

## 2018-11-08 NOTE — Progress Notes (Signed)
ANTICOAGULATION CONSULT NOTE - Initial Consult  Pharmacy Consult for heparin Indication: atrial fibrillation  No Known Allergies  Patient Measurements: Height: 5\' 6"  (167.6 cm) Weight: 186 lb (84.4 kg) IBW/kg (Calculated) : 59.3 Heparin Dosing Weight: 77 kg  Vital Signs: Temp: 98.5 F (36.9 C) (10/09 0402) BP: 199/94 (10/09 0900) Pulse Rate: 88 (10/09 0855)  Labs: Recent Labs    11/05/18 2353 11/06/18 0539 11/07/18 0524 11/07/18 1325 11/07/18 1326 11/07/18 2223 11/08/18 0745  HGB 11.5*  --  9.8*  --   --   --   --   HCT 38.9  --  33.3*  --   --   --   --   PLT 347  --  259  --   --   --   --   APTT  --   --   --  33  --  48* 31  LABPROT  --   --   --  13.9  --   --   --   INR  --   --   --  1.1  --   --   --   HEPARINUNFRC  --   --   --   --  0.91*  --  0.22*  CREATININE 1.37* 1.27* 1.17*  --   --   --  1.14*  1.12*    Estimated Creatinine Clearance: 43.8 mL/min (A) (by C-G formula based on SCr of 1.14 mg/dL (H)).   Medical History: Past Medical History:  Diagnosis Date  . Acute CHF (congestive heart failure) ([place]) 10/2018  . Atrial fibrillation ([place])   . Cancer (HCC)    Breast   . GERD (gastroesophageal reflux disease)   . Hypertension     Assessment: Pharmacy consulted to dose/monitor heparin for atrial fibrillation. Pt was taking apixaban PTA for afib, last dose on 10/6 PM - has been held during admission pending thoracentesis which was done on 10/7. Pharmacy consulted to begin heparin today with no bolus.  Heparin started 10/8 at 900 units/hr, no bolus per consult orders. Baseline Hgb 9.8 Baseline HL 0.91 falsely elevated as a reflection of DOAC PTA 10/8 at 2223 Hep level = 48 seconds, below desired range, heparin rate increased to 1000 units/hr  Today, 11/08/18  0500 Heparin stopped for pleural drainage catheter placement 0745 Hep level low (0.22), aPTT low (31) as expected off Heparin IR procedure ended 0910, can resume Heparin 4 hr after end of  procedure per guidelines  Goal of Therapy:  -Heparin level 0.3-0.7 units/ml; consider targeting lower end of therapeutic range 0.3-0.5 as MD note mentions starting low dose heparin without bolus -aPTT 66-102 seconds, consider targeting lower end of therapeutic range (66-84 seconds) Monitor platelets by anticoagulation protocol: Yes   Plan:   Resume Heparin at 1000 units/hr at 1300 today  Check aPTT & HL at 8pm  Daily CBC and Hep Level ordered  Minda Ditto, PharmD 11/08/2018,9:27 AM

## 2018-11-08 NOTE — Progress Notes (Signed)
Patient was in PACU when vitals were taken.

## 2018-11-08 NOTE — Care Management Important Message (Signed)
Important Message  Patient Details IM Letter given to Sharren Bridge SW to present to the Patient Name: Beverly Tucker MRN: [id number] Date of Birth: [birth year]   Medicare Important Message Given:  Yes     Kerin Salen 11/08/2018, 11:19 AM

## 2018-11-08 NOTE — Progress Notes (Signed)
11/08/2018  1205  One case of 10 pleurx drainage kits are in patient's room on bedside window.

## 2018-11-08 NOTE — Progress Notes (Signed)
Pharmacy Brief Note - Heparin Follow Up:  Patient currently on heparin drip for atrial fibrillation - being monitored via aPTT until aPTT and HL correlate as patient was on apixaban PTA.   Assessment:   APTT = 54 seconds is subtherapeutic on heparin infusion of 1000 units/hr  HL (0.33) and aPTT NOT correlating  Per discussion with RN - no interruptions or issues with infusion. No signs/symptoms of bleeding/bruising.   Goal: APTT: 66-84 seconds HL: 0.3-05  Plan:   No bolus per consult   Increase heparin infusion to 1100 units/hr  Check aPTT and HL in 8 hours  CBC daily  Lenis Noon, PharmD 11/08/18 9:46 PM

## 2018-11-08 NOTE — Progress Notes (Signed)
MEDICATION-RELATED CONSULT NOTE   IR Procedure Consult - Anticoagulant/Antiplatelet PTA/Inpatient Med List Review by Pharmacist    Procedure: Pleural Drain Catheter placement    Completed: 10/9 at 9:10  Post-Procedural bleeding risk per IR MD assessment:  Low  Antithrombotic medications on inpatient or PTA profile prior to procedure:   Heparin infusion, therapeutic    Recommended restart time per IR Post-Procedure Guidelines:  Resume 4 hr post-procedure   Other considerations:      Plan:    Resume Heparin at 1pm at 1000 units/hr  Minda Ditto PharmD Pager [phone] 11/08/2018, 9:57 AM

## 2018-11-08 NOTE — Progress Notes (Signed)
[facility]   HEMATOLOGY/ONCOLOGY INPATIENT PROGRESS NOTE  Date of Service: 11/08/2018  Inpatient Attending: .Nita Sells, MD   SUBJECTIVE  Patient admitted with symptomatic recurrent pleural effusion. She had a left sided thoracentesis with removal of 999ml of pleural fluid. Notes improvement in her breathing as a result. There is concern that the pleural effusion is rapidly reaccumulating and she is being evaluated for an indwelling pleurx catheter. Hasnt start the Letrozole and Verzenio yet.   OBJECTIVE:  NAD  PHYSICAL EXAMINATION: . Vitals:   11/07/18 1020 11/07/18 1021 11/07/18 1417 11/07/18 1938  BP: (!) 157/66  (!) 122/59 (!) 152/71  Pulse: 82 79 74 80  Resp: 18  17 19   Temp: 98.5 F (36.9 C)  98.4 F (36.9 C) 99.6 F (37.6 C)  TempSrc:      SpO2: (!) 87% 97% 97% 97%  Weight:      Height:       Filed Weights   11/05/18 2117  Weight: 186 lb (84.4 kg)   .Body mass index is 30.02 kg/m.  GENERAL:alert, in no acute distress and comfortable SKIN: skin color, texture, turgor are normal, no rashes or significant lesions EYES: normal, conjunctiva are pink and non-injected, sclera clear OROPHARYNX:no exudate, no erythema and lips, buccal mucosa, and tongue normal  NECK: supple, no JVD, thyroid normal size, non-tender, without nodularity LYMPH:  no palpable lymphadenopathy in the cervical, axillary or inguinal LUNGS: decreased left sided air entry HEART: regular rate & rhythm,  no murmurs and no lower extremity edema ABDOMEN: abdomen soft, non-tender, normoactive bowel sounds  Musculoskeletal: no cyanosis of digits and no clubbing  PSYCH: alert & oriented x 3 with fluent speech NEURO: no focal motor/sensory deficits  MEDICAL HISTORY:  Past Medical History:  Diagnosis Date   Acute CHF (congestive heart failure) (HCC) 10/2018   Atrial fibrillation (HCC)    Cancer (HCC)    Breast    GERD (gastroesophageal reflux disease)    Hypertension     SURGICAL  HISTORY: Past Surgical History:  Procedure Laterality Date   CHOLECYSTECTOMY     IR THORACENTESIS ASP PLEURAL SPACE W/IMG GUIDE  10/22/2018    SOCIAL HISTORY: Social History   Socioeconomic History   Marital status: Widowed    Spouse name: Not on file   Number of children: Not on file   Years of education: Not on file   Highest education level: Not on file  Occupational History   Not on file  Social Needs   Financial resource strain: Not on file   Food insecurity    Worry: Not on file    Inability: Not on file   Transportation needs    Medical: Not on file    Non-medical: Not on file  Tobacco Use   Smoking status: Never Smoker   Smokeless tobacco: Never Used  Substance and Sexual Activity   Alcohol use: Never    Frequency: Never   Drug use: Never   Sexual activity: Not on file  Lifestyle   Physical activity    Days per week: Not on file    Minutes per session: Not on file   Stress: Not on file  Relationships   Social connections    Talks on phone: Not on file    Gets together: Not on file    Attends religious service: Not on file    Active member of club or organization: Not on file    Attends meetings of clubs or organizations: Not on file  Relationship status: Not on file   Intimate partner violence    Fear of current or ex partner: Not on file    Emotionally abused: Not on file    Physically abused: Not on file    Forced sexual activity: Not on file  Other Topics Concern   Not on file  Social History Narrative   Not on file    FAMILY HISTORY: History reviewed. No pertinent family history.  ALLERGIES:  has No Known Allergies.  MEDICATIONS:  Scheduled Meds:  metoprolol tartrate  25 mg Oral BID   Continuous Infusions:   ceFAZolin (ANCEF) IV     heparin 1,000 Units/hr (11/07/18 2353)   PRN Meds:.acetaminophen **OR** acetaminophen, ALPRAZolam  REVIEW OF SYSTEMS:    10 Point review of Systems was done is negative except  as noted above.   LABORATORY DATA:  I have reviewed the data as listed  . CBC Latest Ref Rng & Units 11/07/2018 11/05/2018 10/23/2018  WBC 4.0 - 10.5 K/uL 8.6 12.7(H) 11.0(H)  Hemoglobin 12.0 - 15.0 g/dL 9.8(L) 11.5(L) 10.6(L)  Hematocrit 36.0 - 46.0 % 33.3(L) 38.9 33.5(L)  Platelets 150 - 400 K/uL 259 347 291    . CMP Latest Ref Rng & Units 11/07/2018 11/06/2018 11/05/2018  Glucose 70 - 99 mg/dL 101(H) 107(H) 108(H)  BUN 8 - 23 mg/dL 22 24(H) 24(H)  Creatinine 0.44 - 1.00 mg/dL 1.17(H) 1.27(H) 1.37(H)  Sodium 135 - 145 mmol/L 138 140 138  Potassium 3.5 - 5.1 mmol/L 4.1 4.0 4.1  Chloride 98 - 111 mmol/L 100 99 99  CO2 22 - 32 mmol/L 29 30 29   Calcium 8.9 - 10.3 mg/dL 8.6(L) 8.7(L) 9.0  Total Protein 6.5 - 8.1 g/dL - - 7.3  Total Bilirubin 0.3 - 1.2 mg/dL - - 0.4  Alkaline Phos 38 - 126 U/L - - 80  AST 15 - 41 U/L - - 21  ALT 0 - 44 U/L - - 26     RADIOGRAPHIC STUDIES: I have personally reviewed the radiological images as listed and agreed with the findings in the report. Dg Eye Foreign Body  Result Date: 10/21/2018 CLINICAL DATA:  Previous unspecified eye surgery; clearance prior to MRI EXAM: ORBITS FOR FOREIGN BODY - 2 VIEW COMPARISON:  None. FINDINGS: There is no evidence of metallic foreign body within the orbits. No significant bone abnormality identified. IMPRESSION: No evidence of metallic foreign body within the orbits. Electronically Signed   By: Logan Bores M.D.   On: 10/21/2018 16:34   Dg Chest 1 View  Result Date: 11/06/2018 CLINICAL DATA:  Post left-sided thoracentesis. EXAM: CHEST  1 VIEW COMPARISON:  Radiographs 11/05/2018 and 10/22/2018. FINDINGS: 1344 hours. The left pleural effusion has decreased in volume. There is improved aeration of the left lung base. There is a stable small right pleural effusion and mild right basilar atelectasis. The heart size and mediastinal contours are stable with aortic atherosclerosis. No pneumothorax. IMPRESSION: 1. Decreased left  pleural effusion and improved aeration of the left lung base status post thoracentesis. No pneumothorax. 2. Stable small right pleural effusion and right basilar atelectasis. Electronically Signed   By: Richardean Sale M.D.   On: 11/06/2018 14:16   Dg Chest 1 View  Result Date: 10/22/2018 CLINICAL DATA:  Status post left thoracentesis. EXAM: CHEST  1 VIEW COMPARISON:  Radiograph of same day. FINDINGS: Stable cardiomegaly. No pneumothorax is noted. Left pleural effusion is significantly smaller status post thoracentesis. Mild right basilar atelectasis is noted with probable small right pleural  effusion. Bony thorax is unremarkable. IMPRESSION: Left pleural effusion is significantly smaller status post thoracentesis. No pneumothorax is noted. Electronically Signed   By: Marijo Conception M.D.   On: 10/22/2018 12:57   Dg Chest 2 View  Result Date: 11/07/2018 CLINICAL DATA:  Left thoracentesis yesterday EXAM: CHEST - 2 VIEW COMPARISON:  11/06/2018 FINDINGS: Moderate left pleural effusion appears slightly larger. No pneumothorax. Consolidation left lower lobe shows progression Small right effusion and mild right lower lobe airspace disease unchanged. Negative for heart failure or edema. IMPRESSION: Progression of left effusion and left lower lobe consolidation following left thoracentesis. No pneumothorax. Electronically Signed   By: Franchot Gallo M.D.   On: 11/07/2018 09:53   Dg Chest 2 View  Result Date: 11/05/2018 CLINICAL DATA:  Shortness of breath EXAM: CHEST - 2 VIEW COMPARISON:  October 22, 2018 FINDINGS: There is mild cardiomegaly. A moderate to large left pleural effusion is seen. There is trace right pleural effusion. Mildly increased interstitial markings seen predominantly at the right lung base, likely chronic lung changes. Air not calcifications. IMPRESSION: Moderate to large left and trace right pleural effusion. Electronically Signed   By: Prudencio Pair M.D.   On: 11/05/2018 22:09   Dg Abd 1  View  Result Date: 10/16/2018 CLINICAL DATA:  Increased SOB as well as nausea and emesis. Hx of CHF, HTN, GERD. EXAM: ABDOMEN - 1 VIEW COMPARISON:  None. FINDINGS: The bowel gas pattern is normal. No radio-opaque calculi or other significant radiographic abnormality are seen. IMPRESSION: Negative. Electronically Signed   By: Nolon Nations M.D.   On: 10/16/2018 18:36   Ct Chest Wo Contrast  Result Date: 10/14/2018 CLINICAL DATA:  78 year old female with past medical history of hypertension who presents with a 3-week history of progressive worsening of shortness of breath. Patient reports that symptoms started with a runny nose and cough productive of clear white mucus. Patient saw outpatient provider, was COVID negative, was started on allergy medication. Shortness of breath continue to progress, saw provider on 9/12, was diagnosed with pneumonia, and started on Keflex. At that visit, patient was also started on doxycycline for cellulitis (under right breast). EXAM: CT CHEST WITHOUT CONTRAST TECHNIQUE: Multidetector CT imaging of the chest was performed following the standard protocol without IV contrast. COMPARISON:  Current chest radiograph. FINDINGS: Cardiovascular: Mild enlargement of the heart. No pericardial effusion. Three-vessel coronary artery calcifications. Great vessels normal in caliber. There are aortic atherosclerotic calcifications. Mediastinum/Nodes: No neck base masses or enlarged lymph nodes. No mediastinal or hilar masses or adenopathy. Trachea and esophagus are unremarkable. Lungs/Pleura: Moderate-sized left and small right pleural effusions. Dependent atelectasis in the lower lobes, left greater than right. Mild interstitial thickening most evident the lower lobes. Linear opacities are noted in the left upper lobe posteriorly and in the lingula consistent with atelectasis. There are a few small irregular nodules, largest subpleural, right lower lobe, image 81, series 4, 6 mm. Mild  scarring at the apices. No pneumothorax. Upper Abdomen: No acute findings. Status post cholecystectomy. Aortic atherosclerosis. Musculoskeletal: No fracture or acute finding. No osteoblastic or osteolytic lesions. Chest wall: There is masslike soft tissue that is contiguous with the skin medial aspect of the right breast, measuring approximately 5.7 x 3.5 x 4.9 cm. There is more generalized breast skin thickening bilaterally. Mildly prominent bilateral axillary lymph nodes are noted, none pathologically enlarged by size criteria. IMPRESSION: 1. No convincing pneumonia. 2. Moderate left and small right pleural effusions. There is mild bilateral interstitial thickening most  evident in the lower lungs. This suggests mild interstitial edema. 3. Dependent atelectasis in the lower lobes, left greater than right, with milder atelectasis in the left upper lobe. 4. Small lung nodules most evident in the right middle lobe. 5. Abnormal soft tissue contiguous with the skin of the medial right breast. Although this may be infectious/inflammatory, neoplastic disease should be considered. Consider follow-up diagnostic mammography and possible right breast ultrasound for further assessment. Aortic Atherosclerosis (ICD10-I70.0). Electronically Signed   By: Lajean Manes M.D.   On: 10/14/2018 20:05   Ct Angio Chest Pe W Or Wo Contrast  Result Date: 10/16/2018 CLINICAL DATA:  Urgent patient brought down for PE due to SOB RN stated she had thoracentesis and breast bx and is now SOB and in a EXAM: CT ANGIOGRAPHY CHEST WITH CONTRAST TECHNIQUE: Multidetector CT imaging of the chest was performed using the standard protocol during bolus administration of intravenous contrast. Multiplanar CT image reconstructions and MIPs were obtained to evaluate the vascular anatomy. CONTRAST:  13mL OMNIPAQUE IOHEXOL 350 MG/ML SOLN COMPARISON:  Chest x-ray on 10/16/2018 FINDINGS: Cardiovascular: Heart size is mildly enlarged. There is atherosclerotic  calcification of the coronary arteries. Calcification of the mitral annulus. Trace pericardial effusion. The pulmonary arteries are well opacified by contrast bolus and there is no evidence for acute pulmonary embolus. Note is made of atherosclerotic calcification of the thoracic aorta not associated with aneurysm. Mediastinum/Nodes: The visualized portion of the thyroid gland has a normal appearance. Esophagus is normal/in appearance. There are 3 level I RIGHT axillary lymph nodes with abnormal morphology, there small axillary lymph nodes bilaterally, measuring up to 1 centimeter in diameter. No mediastinal or hilar adenopathy. Lungs/Pleura: There is a small anterior LEFT pneumothorax, estimated to be 5-10% of lung volume. Persistent LEFT pleural effusion and associated atelectasis. Trace RIGHT pleural effusion and basilar atelectasis. Upper Abdomen: No acute abnormality. Musculoskeletal: Thoracic kyphosis. No suspicious lytic or blastic lesions are identified. Soft tissues: Irregularity, skin thickening, and asymmetry within the inferior and MEDIAL portion of the RIGHT breast, suspicious for malignancy. Small subcutaneous nodule is identified below the RIGHT breast, measuring 8 millimeters on image 110/5 Review of the MIP images confirms the above findings. IMPRESSION: 1. Technically adequate exam showing no acute pulmonary embolus. 2. Small LEFT pneumothorax, estimated to be 5-10% of lung volume. 3. Small to moderate LEFT pleural effusion and associated atelectasis. 4. Trace RIGHT pleural effusion and basilar atelectasis. 5. Cardiomegaly and coronary artery disease. 6. RIGHT breast skin thickening and irregularity suspicious for malignancy. Recommend diagnostic mammography and ultrasound when the patient is able. 7. Small bilateral axillary lymph nodes, 1 centimeter in diameter. 8. Aortic Atherosclerosis (ICD10-I70.0). These results were called by telephone at the time of interpretation on 10/16/2018 at 7:52 pm to  provider Dr. Marianna Payment, Who verbally acknowledged these results. Electronically Signed   By: Nolon Nations M.D.   On: 10/16/2018 19:53   Mr Jeri Cos [id number] Contrast  Result Date: 10/21/2018 CLINICAL DATA:  Breast cancer staging. EXAM: MRI HEAD WITHOUT AND WITH CONTRAST TECHNIQUE: Multiplanar, multiecho pulse sequences of the brain and surrounding structures were obtained without and with intravenous contrast. CONTRAST:  7mL GADAVIST GADOBUTROL 1 MMOL/ML IV SOLN COMPARISON:  None. FINDINGS: Brain: Mild cerebral atrophy. Negative for hydrocephalus. Negative for acute or chronic infarct. Negative for hemorrhage, mass, or edema. Normal enhancement postcontrast infusion.  No enhancing mass lesion. Vascular: Normal arterial flow voids Skull and upper cervical spine: Negative Sinuses/Orbits: Negative Other: None IMPRESSION: Negative MRI brain with contrast.  Negative for metastatic disease. Electronically Signed   By: Franchot Gallo M.D.   On: 10/21/2018 17:47   Ct Abdomen Pelvis W Contrast  Result Date: 10/22/2018 CLINICAL DATA:  History of breast cancer, breast cancer staging with suspicion for metastatic disease. EXAM: CT ABDOMEN AND PELVIS WITH CONTRAST TECHNIQUE: Multidetector CT imaging of the abdomen and pelvis was performed using the standard protocol following bolus administration of intravenous contrast. CONTRAST:  1100mL OMNIPAQUE IOHEXOL 300 MG/ML  SOLN COMPARISON:  None. FINDINGS: Lower chest: Opacification of the left inferior chest due to large left-sided pleural effusion with basilar collapse, nodularity in the costodiaphragmatic sulcus best seen on sagittal images the left chest. Variable enhancement of collapsed lung tissue is incompletely imaged. Basilar atelectasis noted on the right with nodular opacity at the right lung base. Heart is incompletely imaged, no visualized pericardial fluid or nodularity. Hepatobiliary: Subtle low-attenuation in the medial section of the left hepatic lobe (hepatic  subsegment IV B) best seen on image 20, series 3 measuring approximately 1.5 x 1.4 cm. Post cholecystectomy without signs of biliary ductal dilation. Pancreas: Unremarkable. No pancreatic ductal dilatation or surrounding inflammatory changes. Spleen: No signs of focal splenic lesion. Adrenals/Urinary Tract: Adrenal glands are normal. No signs of hydronephrosis. Symmetric renal enhancement. Cyst arises from the lower pole the left kidney. Stomach/Bowel: Stomach is normal. Small bowel with normal caliber, no signs of acute small bowel process. A normal appendix. Moderate size periumbilical ventral hernia contains the midportion of the transverse colon, no signs of obstruction at this time. Moderate sigmoid diverticulosis with mild fascial thickening along the posterior aspect of sigmoid colon in the sigmoid mesocolon. Vascular/Lymphatic: Moderate to marked calcific atherosclerosis of the abdominal aorta extending into the iliac vessels. No signs of aneurysm in the abdomen. No retroperitoneal adenopathy. Scattered lymph nodes are present but without pathologic size. No signs of pelvic lymphadenopathy. Reproductive: Thickening of the endometrium beyond what is expected for this 78 year old patient measuring nearly 2 cm as estimated by CT. Other: Urinary bladder is decompressed. Hernia as discussed above. No signs of acute process in the body wall with areas of gas in the subcutaneous fat likely related to prior injection. Bowing of levator ani musculature with descent of anterior middle and posterior compartment (bladder base, uterus-vagina and rectum) visceral contents. (Findings are indicative of pelvic floor dysfunction and should be correlated clinically. Musculoskeletal: Mixed lytic and sclerotic process in the right ischium measures 1.8 x 1.6 cm (image 74, series 3). Mixed lytic and sclerotic changes in the right eighth and ninth ribs with subacute fractures at the lateral margin of these ribs. Cortical  destruction the medial cortex of the right tenth rib. Question of increased enhancement within the central canal along the distal aspect of the spinal cord and filum terminalis. Spinal degenerative changes. Small sclerotic focus at the L1 level best seen on sagittal images. Grade 1 anterolisthesis of L5 on S1 with pars defects at this level the left. Vertebral hemangioma at L3. IMPRESSION: 1. Large malignant left-sided effusion. Trace effusion on the right with basilar atelectasis. 2. Variation in parenchymal enhancement raising the question of hilar lesion in the left chest, consider CT chest if not performed for further evaluation. 3. 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 4. Endometrial thickening may represent a second primary, consider endovaginal sonogram for further assessment as warranted. 5. Question of added enhancement in these central canal along the spinal cord  internal nerve roots. This could be seen in the setting of CNS involvement but is not certain. MRI of the spine for additional staging could be performed as warranted. 6. Area of vague hypoattenuation anterior and inferior to portal structures in medial section left hepatic lobe likely an area of fatty infiltration, attention on follow-up or assess with MR for definitive characterization. Electronically Signed   By: Zetta Bills M.D.   On: 10/22/2018 08:26   Dg Chest Port 1 View  Result Date: 10/22/2018 CLINICAL DATA:  Pleural effusion. EXAM: PORTABLE CHEST 1 VIEW COMPARISON:  10/16/2018. FINDINGS: Stable cardiomegaly. No pulmonary venous congestion. Prominent left-sided pleural effusion, progressed from prior exam. Underlying left lower lung atelectasis/infiltrate most likely present. Atelectatic changes right lung base also again noted. IMPRESSION: 1.  Stable cardiomegaly.  No pulmonary venous congestion. 2. Prominent  left-sided pleural effusion, progressed from prior exam. Underlying left lower lobe atelectasis/infiltrate most likely present. Atelectatic changes right lung base also again noted. Electronically Signed   By: Marcello Moores  Register   On: 10/22/2018 10:03   Dg Chest Port 1 View  Result Date: 10/16/2018 CLINICAL DATA:  Increased SOB as well as nausea and emesis. Hx of CHF, HTN, GERD. EXAM: PORTABLE CHEST 1 VIEW COMPARISON:  10/16/2018 FINDINGS: The heart is enlarged and stable in configuration. LEFT pleural effusion and LEFT basilar opacity are stable. Atelectasis or early infiltrate at the RIGHT lung base. No pulmonary edema. IMPRESSION: Stable appearance of the chest. Electronically Signed   By: Nolon Nations M.D.   On: 10/16/2018 18:32   Dg Chest Port 1 View  Result Date: 10/16/2018 CLINICAL DATA:  Productive cough, shortness of breath EXAM: PORTABLE CHEST 1 VIEW COMPARISON:  10/15/2018 FINDINGS: Slight interval increase in left-sided pleural effusion with associated atelectasis or consolidation. There is increased, subtle heterogeneous opacity at the right lung base. Cardiomegaly. IMPRESSION: Slight interval increase in left-sided pleural effusion with associated atelectasis or consolidation. There is increased, subtle heterogeneous opacity at the right lung base. Findings remain consistent with infection or aspiration Electronically Signed   By: Eddie Candle M.D.   On: 10/16/2018 15:13   Dg Chest Port 1 View  Result Date: 10/15/2018 CLINICAL DATA:  Follow-up LEFT pneumothorax following thoracentesis. EXAM: PORTABLE CHEST 1 VIEW COMPARISON:  Film earlier this day FINDINGS: The very small LEFT pneumothorax is no longer identified. Mild bibasilar atelectasis again noted. Cardiomegaly is present. No other changes noted. IMPRESSION: No visualized pneumothorax. Mild basilar atelectasis. Electronically Signed   By: Margarette Canada M.D.   On: 10/15/2018 19:11   Dg Chest Portable 1 View  Result Date:  10/15/2018 CLINICAL DATA:  S/p LEFT thoracentesis. EXAM: PORTABLE CHEST 1 VIEW COMPARISON:  10/14/2018 chest radiograph and CT FINDINGS: Near-complete resolution of visualized LEFT pleural effusion with very small apical and basilar pneumothorax. Bibasilar atelectasis again noted. Cardiomegaly again noted. IMPRESSION: Very small LEFT apical and basilar pneumothorax following LEFT thoracentesis. Near complete resolution of visualized LEFT pleural effusion. Electronically Signed   By: Margarette Canada M.D.   On: 10/15/2018 15:28   Dg Chest Port 1 View  Result Date: 10/14/2018 CLINICAL DATA:  Shortness of breath EXAM: PORTABLE CHEST 1 VIEW COMPARISON:  Chest radiograph dated 10/05/2018. FINDINGS: The heart appears enlarged accounting for technique. There is a small a moderate left pleural effusion with associated atelectasis/airspace disease. There is mild right basilar atelectasis/airspace disease. There is no pneumothorax. The visualized skeletal structures are unremarkable. IMPRESSION: Small to moderate left pleural effusion with associated atelectasis/airspace disease. Mild right basilar  atelectasis/airspace disease. Cardiomegaly. Electronically Signed   By: Zerita Boers M.D.   On: 10/14/2018 14:38   Ir Thoracentesis Asp Pleural Space W/img Guide  Result Date: 10/22/2018 INDICATION: Patient with history of CHF, chronic kidney disease, metastatic breast cancer, dyspnea, recurrent left pleural effusion. Request made for diagnostic and therapeutic left thoracentesis. EXAM: ULTRASOUND GUIDED DIAGNOSTIC AND THERAPEUTIC LEFT THORACENTESIS MEDICATIONS: None COMPLICATIONS: None immediate. PROCEDURE: An ultrasound guided thoracentesis was thoroughly discussed with the patient and questions answered. The benefits, risks, alternatives and complications were also discussed. The patient understands and wishes to proceed with the procedure. Written consent was obtained. Ultrasound was performed to localize and mark an  adequate pocket of fluid in the left chest. The area was then prepped and draped in the normal sterile fashion. 1% Lidocaine was used for local anesthesia. Under ultrasound guidance a 6 Fr Safe-T-Centesis catheter was introduced. Thoracentesis was performed. The catheter was removed and a dressing applied. FINDINGS: A total of approximately 820 cc of hazy, amber fluid was removed. Samples were sent to the laboratory as requested by the clinical team. IMPRESSION: Successful ultrasound guided diagnostic and therapeutic left thoracentesis yielding 820 cc of pleural fluid. Read by: Rowe Robert, PA-C Electronically Signed   By: Sandi Mariscal M.D.   On: 10/22/2018 12:25   US Thoracentesis Asp Pleural Space W/img Guide  Result Date: 11/06/2018 INDICATION: Patient with history of CHF, chronic kidney disease, dyspnea, metastatic breast cancer, recurrent left pleural effusion. Request made for therapeutic left thoracentesis. EXAM: ULTRASOUND GUIDED THERAPEUTIC LEFT THORACENTESIS MEDICATIONS: None COMPLICATIONS: None immediate. PROCEDURE: An ultrasound guided thoracentesis was thoroughly discussed with the patient and questions answered. The benefits, risks, alternatives and complications were also discussed. The patient understands and wishes to proceed with the procedure. Written consent was obtained. Ultrasound was performed to localize and mark an adequate pocket of fluid in the left chest. The area was then prepped and draped in the normal sterile fashion. 1% Lidocaine was used for local anesthesia. Under ultrasound guidance a 6 Fr Safe-T-Centesis catheter was introduced. Thoracentesis was performed. The catheter was removed and a dressing applied. FINDINGS: A total of approximately 900 cc of amber/blood-tinged fluid was removed. Due to persistent patient coughing only the above amount of fluid was removed today. IMPRESSION: Successful ultrasound guided therapeutic left thoracentesis yielding 900 cc of pleural fluid.  Read by: Rowe Robert, PA-C Electronically Signed   By: Jacqulynn Cadet M.D.   On: 11/06/2018 14:11    ASSESSMENT & PLAN:   #1 Metastatic Rt breast cancer ER/PR +ve Her 2 neg  #2 moderate left and small right-sided pleural effusion with shortness of breath. Cytology on left-sided pleural effusion-positive for malignant cells consistent with adenocarcinoma.IHC not done to determine primary site. 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 PLAN: -patient felt better after therapeutic left thoracentesis but the left malignant pleural effusion appears to be rapidly reaccumulating.leading to consideration of indwelling left sided pleurx catheter  -will need home health serv on discharge to help with chest tube mx -anticoag mx per hospitalist -prescribed a Aromatase inhibitor and a CDK4/6 inhibitor  -continue f/u with cardiologist for optimization of CHF and afib mx -Will place pt on Xgeva shots once a month  -Will order an MRI of the spine in 1 week ? Spinal involvement by  breast cancer - I spent 25 minutes counseling the patient face to face. The total time spent in the appointment was 35 minutes and more than 50% was on counseling and direct patient cares.    Sullivan Lone MD [place] AAHIVMS [facility] Hematology/Oncology Physician [facility]  (Office):       [phone] (Work cell):  [phone] (Fax):           [phone]

## 2018-11-08 NOTE — Progress Notes (Signed)
PT Cancellation Note  Patient Details Name: Beverly Tucker MRN: [id number] DOB: 1940-05-16   Cancelled Treatment:    Reason Eval/Treat Not Completed: Pain limiting ability to participate - Pt with thoracentesis this am, and is crying with pain at present. Pt requests PT eval tomorrow, will check back then.  Julien Girt, PT Acute Rehabilitation Services Pager [phone]  Office [phone]    Roxine Caddy D [name] 11/08/2018, 12:44 PM

## 2018-11-08 NOTE — Progress Notes (Signed)
11/08/2018  1336  Patient's granddaughter took 9 Pleurx drainage kits home to patient house. One drainage kit left at bedside.

## 2018-11-08 NOTE — TOC Initial Note (Signed)
Transition of Care [facility]) - Initial/Assessment Note    Patient Details  Name: Beverly Tucker MRN: [id number] Date of Birth: Jun 04, 1940  Transition of [facility]) CM/SW Contact:    Beverly Nephew, LCSW Phone Number: [phone] 11/08/2018, 2:59 PM  Clinical Narrative:        Pt admitted with recurrent malignant pleural effusion.  Is patient of cancer center. Lives at home with strong support of family. Had [facility]; PT/aide/RN), and DME ([employer]; oxygen and 3n1) arranged during recent hospitalization.  Had pleurx chest drain placed during this hospitalization and expects to manage at home with home health support and family assistance. Beverly Tucker made aware of new pleurx drain. Pt has 1 case of pleurx kits to take home.    TOC team will follow for additional needs.   Patient Goals and CMS Choice Patient states their goals for this hospitalization and ongoing recovery are:: "hope to be off of oxygen one day"      Expected Discharge Plan and [place] with Home Health  Discharge Planning Services: CM Consult                     Prior Living Arrangements/Services    Home/ Home Health/DME              Current home services: Homehealth aide, Home PT, Home RN, DME    Activities of Daily Living Home Assistive Devices/Equipment: Oxygen, Dentures (specify type), Walker (specify type), Bedside commode/3-in-1(upper/lower dentures, front wheeled walker) ADL Screening (condition at time of admission) Patient's cognitive ability adequate to safely complete daily activities?: Yes Is the patient deaf or have difficulty hearing?: No Does the patient have difficulty seeing, even when wearing glasses/contacts?: No Does the patient have difficulty concentrating, remembering, or making decisions?: No Patient able to express need for assistance with ADLs?: Yes Does the patient have difficulty dressing or bathing?: Yes Independently performs ADLs?: No Communication:  Independent Dressing (OT): Needs assistance Is this a change from baseline?: Change from baseline, expected to last >3 days Grooming: Needs assistance Is this a change from baseline?: Change from baseline, expected to last >3 days Feeding: Needs assistance Is this a change from baseline?: Change from baseline, expected to last >3 days Bathing: Needs assistance Is this a change from baseline?: Change from baseline, expected to last >3 days Toileting: Needs assistance Is this a change from baseline?: Change from baseline, expected to last >3days In/Out Bed: Needs assistance Is this a change from baseline?: Change from baseline, expected to last >3 days Walks in Home: Needs assistance Is this a change from baseline?: Change from baseline, expected to last >3 days Does the patient have difficulty walking or climbing stairs?: Yes(secondary to shortness of breath) Weakness of Legs: Both Weakness of Arms/Hands: None  Permission Sought/Granted Permission sought to share information with : Other (comment) Permission granted to share information with : Yes, Verbal Permission Granted     Permission granted to share info w AGENCY: [facility], [employer] DME        Emotional Assessment    engaged, pleasant, oriented x4          Admission diagnosis:  Pleural effusion [J90] Malignant pleural effusion [J91.0] S/P thoracentesis [Z98.890] Patient Active Problem List   Diagnosis Date Noted  . Malignant pleural effusion   . 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 11/06/2018  . AKI (acute kidney injury) ([place]) 11/06/2018  . AF (paroxysmal atrial fibrillation) ([place]) 11/06/2018  . Pleural effusion  11/06/2018  . Bone metastases ([place]) 11/04/2018  . Metastatic breast cancer ([place])   . Pneumothorax   . Gram-positive bacteremia   . Mass of breast, right 10/15/2018  . Acute CHF (congestive heart failure) ([place]) 10/15/2018  . Shortness of breath 10/14/2018  . CKD (chronic kidney disease) stage  3, GFR 30-59 ml/min 02/06/2018  . HTN (hypertension) 12/01/2015   PCP:  Beverly Sacramento, MD Pharmacy:   [id number] - [place], [place] - [address] [place] [address] Phone: [phone] Fax: [place], [place] [address] Phone: [phone] Fax: [phone]     Social Determinants of Health (SDOH) Interventions    Readmission Risk Interventions Readmission Risk Prevention Plan 10/23/2018  Transportation Screening Complete  PCP or Specialist Appt within 5-7 Days Complete  Home Care Screening Complete  Medication Review (RN CM) Complete  Some recent data might be hidden

## 2018-11-08 NOTE — Progress Notes (Signed)
Hospitalist progress note  Beverly Tucker  [id number] DOB: 1941-01-17 DOA: 11/05/2018 PCP: [name], MD  Narrative:  70 WF recent adm c R metastatic breast cancer-biopsied 9/16 adenocarcinoma-5.7 X3.5 X4.9R mass with fungating tumor-? Enhancement central cord MRI deferred secondary to patient preference-underwent thoracentesis x2, first one 9/15 =900 cc second one 9/22 =822 cc--echo did not show diastolic heart failure Complications of A. fib RVR during hospital stay started on metoprolol Cardizem apixaban. Returns to the hospital-shortness of breath-CXR = moderate to large left pleural effusion  Assessment & Plan: Recurrent pleural effusion-thoracentesis 10/7 no labs needed at the time-my review of x-ray 10/8 shows some accumulation-IR placed pleurx 10/9 experiencing some severe pain therefore given morphine 1 mg every 3, first choice tramadol 50 started-pain needs to be controlled prior to discharge Repeat CXR in a.m. 10/10  Leukocytosis on admission etiology unclear seems to be resolved periodically monitor no current indication for antibiotics at this time  Metastatic right breast cancer/fungating-await start of chemo as outpatient looks like approval has been performed  Atrial fibrillation on Eliquis--Eliquis to be resumed as per discretion of IR-continue metoprolol 25 twice daily she is rate controlled  AKI on admission now resolved-NSL IV-labs a.m.  HTN holding lisinopril/HCTZ continue meds as above  DVT prophylaxis: Heparin per pharmacy code Status:   Presumed full   family Communication:   No family present disposition Plan: inpatient pending resolution-expect in the next day day and a half with repeat chest x-ray possibly can discharge home with Pleurx and follow-up as an outpatient once chemo is started  Consultants:   IR   Oncology spoke with Dr. [name]  Procedures:   Thoracentesis 10/7  Antimicrobials:   none   Subjective: Significant pain left chest tube -  Fever - chills - vomiting - weakness  Objective: Vitals:   11/08/18 0855 11/08/18 0900 11/08/18 0945 11/08/18 1004  BP: (!) 139/92 (!) 199/94 (!) 105/55 (!) 105/55  Pulse: 88 78 72 72  Resp: (!) 27 (!) 31 (!) 22   Temp:   98.5 F (36.9 C)   TempSrc:   Oral   SpO2: 97% 98% 98%   Weight:      Height:        Intake/Output Summary (Last 24 hours) at 11/08/2018 1040 Last data filed at 11/08/2018 1024 Gross per 24 hour  Intake 572.51 ml  Output 125 ml  Net 447.51 ml   Filed Weights   11/05/18 2117  Weight: 84.4 kg    Examination: eomi ncat no pallor no ict no pallor does seem to be in some painful distress cta b but exam is circumscribe given patient is in pain abd obese soft nontender s1 s2 no m/r/g Neuro intact and moving x 4 limbs without deficit  Data Reviewed: I have personally reviewed following labs and imaging studies  CBC: Recent Labs  Lab 11/05/18 2353 11/07/18 0524  WBC 12.7* 8.6  NEUTROABS 9.5* 6.2  HGB 11.5* 9.8*  HCT 38.9 33.3*  MCV 96.5 95.7  PLT 347 [id number]   Basic Metabolic Panel: Recent Labs  Lab 11/05/18 2353 11/06/18 0539 11/07/18 0524 11/08/18 0745  NA 138 140 138 137  137  K 4.1 4.0 4.1 4.0  4.0  CL 99 99 100 96*  96*  CO2 29 30 29 30  30   GLUCOSE 108* 107* 101* 110*  111*  BUN 24* 24* 22 18  19   CREATININE 1.37* 1.27* 1.17* 1.14*  1.12*  CALCIUM 9.0 8.7* 8.6* 8.7*  8.7*  PHOS  --   --   --  3.7   GFR: Estimated Creatinine Clearance: 43.8 mL/min (A) (by C-G formula based on SCr of 1.14 mg/dL (H)). Liver Function Tests: Recent Labs  Lab 11/05/18 2353 11/08/18 0745  AST 21  --   ALT 26  --   ALKPHOS 80  --   BILITOT 0.4  --   PROT 7.3  --   ALBUMIN 3.7 2.8*   No results for input(s): LIPASE, AMYLASE in the last 168 hours. No results for input(s): AMMONIA in the last 168 hours. Coagulation Profile: Recent Labs  Lab 11/07/18 1325  INR 1.1   Cardiac Enzymes:  Radiology Studies: Reviewed images personally in health  database   Scheduled Meds: . lidocaine      . metoprolol tartrate  25 mg Oral BID   Continuous Infusions: . heparin       LOS: 2 days  Time spent: [place], MD [facility]  If 7PM-7AM, please contact night-coverage-look on AMION to find my number otherwise-prefer pages-not epic chat,please 11/08/2018, 10:40 AM

## 2018-11-08 NOTE — Procedures (Signed)
Interventional Radiology Procedure Note  Procedure: LEFT tunneled pleural drainage catheter placement.   Complications: None  Estimated Blood Loss: None  Recommendations: - Begin drainage daily, up to 1L  Signed,  Criselda Peaches, MD

## 2018-11-09 ENCOUNTER — Inpatient Hospital Stay (HOSPITAL_COMMUNITY): Payer: Medicare HMO

## 2018-11-09 DIAGNOSIS — I4891 Unspecified atrial fibrillation: Secondary | ICD-10-CM

## 2018-11-09 DIAGNOSIS — C50919 Malignant neoplasm of unspecified site of unspecified female breast: Secondary | ICD-10-CM

## 2018-11-09 DIAGNOSIS — J910 Malignant pleural effusion: Secondary | ICD-10-CM

## 2018-11-09 DIAGNOSIS — D72829 Elevated white blood cell count, unspecified: Secondary | ICD-10-CM

## 2018-11-09 LAB — CBC
HCT: 33.9 % — ABNORMAL LOW (ref 36.0–46.0)
Hemoglobin: 10.1 g/dL — ABNORMAL LOW (ref 12.0–15.0)
MCH: 28.5 pg (ref 26.0–34.0)
MCHC: 29.8 g/dL — ABNORMAL LOW (ref 30.0–36.0)
MCV: 95.5 fL (ref 80.0–100.0)
Platelets: 250 10*3/uL (ref 150–400)
RBC: 3.55 MIL/uL — ABNORMAL LOW (ref 3.87–5.11)
RDW: 14.7 % (ref 11.5–15.5)
WBC: 10.4 10*3/uL (ref 4.0–10.5)
nRBC: 0 % (ref 0.0–0.2)

## 2018-11-09 LAB — RENAL FUNCTION PANEL
Albumin: 2.7 g/dL — ABNORMAL LOW (ref 3.5–5.0)
Anion gap: 10 (ref 5–15)
BUN: 19 mg/dL (ref 8–23)
CO2: 30 mmol/L (ref 22–32)
Calcium: 8.6 mg/dL — ABNORMAL LOW (ref 8.9–10.3)
Chloride: 96 mmol/L — ABNORMAL LOW (ref 98–111)
Creatinine, Ser: 1.07 mg/dL — ABNORMAL HIGH (ref 0.44–1.00)
GFR calc Af Amer: 57 mL/min — ABNORMAL LOW (ref >60)
GFR calc non Af Amer: 49 mL/min — ABNORMAL LOW (ref >60)
Glucose, Bld: 128 mg/dL — ABNORMAL HIGH (ref 70–99)
Phosphorus: 3.6 mg/dL (ref 2.5–4.6)
Potassium: 4.2 mmol/L (ref 3.5–5.1)
Sodium: 136 mmol/L (ref 135–145)

## 2018-11-09 LAB — APTT: aPTT: 50 seconds — ABNORMAL HIGH (ref 24–36)

## 2018-11-09 LAB — HEPARIN LEVEL (UNFRACTIONATED): Heparin Unfractionated: 0.53 IU/mL (ref 0.30–0.70)

## 2018-11-09 MED ORDER — GUAIFENESIN-DM 100-10 MG/5ML PO SYRP
5.0000 mL | ORAL_SOLUTION | ORAL | Status: DC | PRN
  Administered 2018-11-09: 5 mL via ORAL
  Filled 2018-11-09: qty 10

## 2018-11-09 MED ORDER — OXYCODONE-ACETAMINOPHEN 7.5-325 MG PO TABS
1.0000 | ORAL_TABLET | ORAL | Status: DC | PRN
  Administered 2018-11-10: 1 via ORAL
  Filled 2018-11-09 (×2): qty 1

## 2018-11-09 MED ORDER — APIXABAN 5 MG PO TABS
5.0000 mg | ORAL_TABLET | Freq: Two times a day (BID) | ORAL | Status: DC
  Administered 2018-11-09 – 2018-11-10 (×3): 5 mg via ORAL
  Filled 2018-11-09 (×3): qty 1

## 2018-11-09 NOTE — Progress Notes (Signed)
Pleural effusion drainage performed, draining 350 blood/amber tinged fluid with approximately 50cc of red-tinged foam.  Patient tolerated the procedure well.  Will continue to monitor.    Virginia Rochester, RN

## 2018-11-09 NOTE — Evaluation (Signed)
Physical Therapy Evaluation Patient Details Name: Beverly Tucker MRN: [id number] DOB: 16-Feb-1940 Today's Date: 11/09/2018   History of Present Illness  78 yo female admitted to ED on 10/7 with shortness of breath, coughing, L pleural effusion recurrent in nature due to metastatic breast cancer. Pt with admission to [facility] recently with d/c 9/23 with similar complaints. Pt with L pleurx drain placed 10/9. PMH includes afib, HTN, anxiety, CHF, GERD.  Clinical Impression   Pt presents with generalized weakness, L side pain due to pleurx drain placement, increased time and effort to perform mobility tasks, and decreased activity tolerance due to pain. Pt to benefit from acute PT to address deficits. Pt performed bed mobility and transfer with min guard assist, did not ambulate due to L side pain. PT expects pt to progress well with mobility once pain is controlled, recommending HHPT to address mobility deficits upon d/c. PT to progress mobility as tolerated, and will continue to follow acutely.      Follow Up Recommendations Home health PT;Supervision/Assistance - 24 hour(24/7 initially)    Equipment Recommendations  None recommended by PT    Recommendations for Other Services       Precautions / Restrictions Precautions Precautions: Fall Precaution Comments: pleurx, L side Restrictions Weight Bearing Restrictions: No      Mobility  Bed Mobility Overal bed mobility: Needs Assistance Bed Mobility: Supine to Sit     Supine to sit: Min guard;HOB elevated     General bed mobility comments: Min guard for safety, use of bedrails with increased time and effort.  Transfers Overall transfer level: Needs assistance Equipment used: None Transfers: Sit to/from Stand Sit to Stand: Min guard         General transfer comment: min guard for safety, pt with self-steadying upon standing. Pt stood EOB ~1 minute, limited by L-sided chest pain due to pleurx.  Ambulation/Gait Ambulation/Gait  assistance: (declined, due to pain)              Stairs            Wheelchair Mobility    Modified Rankin (Stroke Patients Only)       Balance Overall balance assessment: Mild deficits observed, not formally tested                                           Pertinent Vitals/Pain Pain Assessment: 0-10 Pain Score: 6  Pain Location: L side, due to pleurx Pain Descriptors / Indicators: Sore Pain Intervention(s): Limited activity within patient's tolerance;Monitored during session;Premedicated before session;Repositioned    Home Living Family/patient expects to be discharged to:: Private residence Living Arrangements: Alone Available Help at Discharge: Family;Available PRN/intermittently(pt's son lives next door and can come over as needed) Type of Home: House Home Access: Stairs to enter Entrance Stairs-Rails: Can reach both Entrance Stairs-Number of Steps: 3 Home Layout: One level Home Equipment: None      Prior Function Level of Independence: Independent               Hand Dominance   Dominant Hand: Right    Extremity/Trunk Assessment   Upper Extremity Assessment Upper Extremity Assessment: Generalized weakness    Lower Extremity Assessment Lower Extremity Assessment: Generalized weakness    Cervical / Trunk Assessment Cervical / Trunk Assessment: Kyphotic  Communication   Communication: No difficulties  Cognition Arousal/Alertness: Awake/alert Behavior During Therapy: WFL for tasks assessed/performed  Overall Cognitive Status: Within Functional Limits for tasks assessed                                        General Comments General comments (skin integrity, edema, etc.): on 1LO2, no dyspnea on exertion    Exercises General Exercises - Lower Extremity Heel Slides: AROM;Both;5 reps;Supine   Assessment/Plan    PT Assessment Patient needs continued PT services  PT Problem List Decreased  strength;Decreased mobility;Decreased activity tolerance;Cardiopulmonary status limiting activity;Decreased balance;Pain       PT Treatment Interventions DME instruction;Therapeutic activities;Gait training;Therapeutic exercise;Patient/family education;Stair training;Balance training;Functional mobility training;Neuromuscular re-education    PT Goals (Current goals can be found in the Care Plan section)  Acute Rehab PT Goals Patient Stated Goal: d/c home, decrease L side pain PT Goal Formulation: With patient Time For Goal Achievement: 11/23/18 Potential to Achieve Goals: Good    Frequency Min 3X/week   Barriers to discharge        Co-evaluation               AM-PAC PT "6 Clicks" Mobility  Outcome Measure Help needed turning from your back to your side while in a flat bed without using bedrails?: A Little Help needed moving from lying on your back to sitting on the side of a flat bed without using bedrails?: A Little Help needed moving to and from a bed to a chair (including a wheelchair)?: A Little Help needed standing up from a chair using your arms (e.g., wheelchair or bedside chair)?: A Little Help needed to walk in hospital room?: A Lot Help needed climbing 3-5 steps with a railing? : A Lot 6 Click Score: 16    End of Session Equipment Utilized During Treatment: Gait belt;Oxygen Activity Tolerance: Patient tolerated treatment well;Patient limited by fatigue Patient left: with call bell/phone within reach;in bed;with bed alarm set Nurse Communication: Mobility status PT Visit Diagnosis: Unsteadiness on feet (R26.81);Muscle weakness (generalized) (M62.81)    Time: 1015-1040 PT Time Calculation (min) (ACUTE ONLY): 25 min   Charges:   PT Evaluation $PT Eval Low Complexity: 1 Low PT Treatments $Therapeutic Activity: 8-22 mins       Herald Vallin Conception Chancy, PT Acute Rehabilitation Services Pager [phone]  Office [phone]   Jaedynn Bohlken D Maraki Macquarrie 11/09/2018, 1:48 PM

## 2018-11-09 NOTE — Progress Notes (Signed)
Hospitalist progress note  Beverly Tucker  [id number] DOB: [birth year] DOA: 11/05/2018 PCP: [name], MD  Narrative:  57 WF recent adm c R metastatic breast cancer-biopsied 9/16 adenocarcinoma-5.7 X3.5 X4.9R mass with fungating tumor-? Enhancement central cord MRI deferred secondary to patient preference-underwent thoracentesis x2, first one 9/15 =900 cc second one 9/22 =822 cc--echo did not show diastolic heart failure Complications of A. fib RVR during hospital stay started on metoprolol Cardizem apixaban. Returns to the hospital-shortness of breath-CXR = moderate to large left pleural effusion  Assessment & Plan: Recurrent pleural effusion-thoracentesis 10/7 no labs needed at the time-my review of x-ray 10/8 shows some accumulation-IR placed pleurx 10/9 pain remains uncontrolled on oral tramadol increased Percocet 7.5 keep morphine 1 mg iii/day-monitor pain control up with nursing later on today-education being done with family  Leukocytosis,?  UTI-had some dysuria 10/8 urine culture pending continue Rocephin empirically transition if UTI present in 1 to 2 days  Metastatic right breast cancer/fungating-await start of chemo as outpatient looks like approval has been performed  Atrial fibrillation on Eliquis--Eliquis resumption today hold heparin -continue metoprolol 25 twice daily she is rate controlled  AKI on admission now resolved-NSL IV-labs a.m.   HTN holding lisinopril/HCTZ continue meds as above  DVT prophylaxis: Heparin per pharmacy code Status:   Presumed full   family Communication:   No family present disposition Plan: inpatient patient pain is not controlled and is unclear of the patient has a UTI likely discharge over the weekend if pain is controlled and manageable on oral medication  Consultants:   IR   Oncology spoke with Dr. [name]  Procedures:   Thoracentesis 10/7  Antimicrobials:   none   Subjective: Pain at chest tube site still present relieved only by  IV morphine tramadol not really helping Some dysuria yesterday-seemingly improved after antibiotics-- F, - CP, - cough, - cold, - fall + Weakness  Objective: Vitals:   11/08/18 0945 11/08/18 1004 11/08/18 1331 11/08/18 2106  BP: (!) 105/55 (!) 105/55 116/66 (!) 161/76  Pulse: 72 72 64 75  Resp: (!) 22  20 (!) 24  Temp: 98.5 F (36.9 C)  98.2 F (36.8 C) (!) 97.5 F (36.4 C)  TempSrc: Oral  Oral   SpO2: 98%   95%  Weight:      Height:        Intake/Output Summary (Last 24 hours) at 11/09/2018 0938 Last data filed at 11/09/2018 0300 Gross per 24 hour  Intake 570.24 ml  Output 325 ml  Net 245.24 ml   Filed Weights   11/05/18 2117  Weight: 84.4 kg    Examination: eomi ncat no pallor no ict no pallor  Seems to be in mild pain Rales on posterior lateral left side lungs no fremitus or resonance Abdomen soft No lower extremity edema Neurologically intact moving all 4 limbs  Data Reviewed: I have personally reviewed following labs and imaging studies  CBC: Recent Labs  Lab 11/05/18 2353 11/07/18 0524 11/09/18 0610  WBC 12.7* 8.6 10.4  NEUTROABS 9.5* 6.2  --   HGB 11.5* 9.8* 10.1*  HCT 38.9 33.3* 33.9*  MCV 96.5 95.7 95.5  PLT 347 259 [id number]   Basic Metabolic Panel: Recent Labs  Lab 11/05/18 2353 11/06/18 0539 11/07/18 0524 11/08/18 0745 11/09/18 0610  NA 138 140 138 137  137 136  K 4.1 4.0 4.1 4.0  4.0 4.2  CL 99 99 100 96*  96* 96*  CO2 29 30 29 30  30  30  GLUCOSE 108* 107* 101* 110*  111* 128*  BUN 24* 24* 22 18  19 19   CREATININE 1.37* 1.27* 1.17* 1.14*  1.12* 1.07*  CALCIUM 9.0 8.7* 8.6* 8.7*  8.7* 8.6*  PHOS  --   --   --  3.7 3.6   GFR: Estimated Creatinine Clearance: 46.6 mL/min (A) (by C-G formula based on SCr of 1.07 mg/dL (H)). Liver Function Tests: Recent Labs  Lab 11/05/18 2353 11/08/18 0745 11/09/18 0610  AST 21  --   --   ALT 26  --   --   ALKPHOS 80  --   --   BILITOT 0.4  --   --   PROT 7.3  --   --   ALBUMIN 3.7 2.8*  2.7*   No results for input(s): LIPASE, AMYLASE in the last 168 hours. No results for input(s): AMMONIA in the last 168 hours. Coagulation Profile: Recent Labs  Lab 11/07/18 1325  INR 1.1   Cardiac Enzymes:  Radiology Studies: Reviewed images personally in health database   Scheduled Meds: . metoprolol tartrate  25 mg Oral BID   Continuous Infusions: . cefTRIAXone (ROCEPHIN)  IV 1 g (11/08/18 1335)     LOS: 3 days  Time spent: [place], MD [facility]  If 7PM-7AM, please contact night-coverage-look on AMION to find my number otherwise-prefer pages-not epic chat,please 11/09/2018, 9:38 AM

## 2018-11-10 LAB — URINE CULTURE: Culture: >=100000 — AB

## 2018-11-10 MED ORDER — OXYCODONE-ACETAMINOPHEN 7.5-325 MG PO TABS: 1.0000 | ORAL_TABLET | ORAL | 0 refills | Status: DC | PRN

## 2018-11-10 MED ORDER — NITROFURANTOIN MACROCRYSTAL 100 MG PO CAPS: 100.0000 mg | ORAL_CAPSULE | Freq: Two times a day (BID) | ORAL | 0 refills | Status: AC

## 2018-11-10 NOTE — Discharge Instructions (Signed)
Pleural Effusion °Pleural effusion is an abnormal buildup of fluid in the layers of tissue between the lungs and the inside of the chest (pleural space) The two layers of tissue that line the lungs and the inside of the chest are called pleura. Usually, there is no air in the space between the pleura, only a thin layer of fluid. Some conditions can cause a large amount of fluid to build up, which can cause the lung to collapse if untreated. A pleural effusion is usually caused by another disease that requires treatment. °What are the causes? °Pleural effusion can be caused by: °· Heart failure. °· Certain infections, such as pneumonia or tuberculosis. °· Cancer. °· A blood clot in the lung (pulmonary embolism). °· Complications from surgery, such as from open heart surgery. °· Liver disease (cirrhosis). °· Kidney disease. °What are the signs or symptoms? °In some cases, pleural effusion may cause no symptoms. If symptoms are present, they may include: °· Shortness of breath, especially when lying down. °· Chest pain. This may get worse when taking a deep breath. °· Fever. °· Dry, long-lasting (chronic) cough. °· Hiccups. °· Rapid breathing. °An underlying condition that is causing the pleural effusion (such as heart failure, pneumonia, blood clots, tuberculosis, or cancer) may also cause other symptoms. °How is this diagnosed? °This condition may be diagnosed based on: °· Your symptoms and medical history. °· A physical exam. °· A chest X-ray. °· A procedure to use a needle to remove fluid from the pleural space (thoracentesis). This fluid is tested. °· Other imaging studies of the chest, such as ultrasound or CT scan. °How is this treated? °Depending on the cause of your condition, treatment may include: °· Treating the underlying condition that is causing the effusion. When that condition improves, the effusion will also improve. Examples of treatment for underlying conditions include: °? Antibiotic medicines to  treat an infection. °? Diuretics or other heart medicines to treat heart failure. °· Thoracentesis. °· Placing a thin flexible tube under your skin and into your chest to continuously drain the effusion (indwelling pleural catheter). °· Surgery to remove the outer layer of tissue from the pleural space (decortication). °· A procedure to put medicine into the chest cavity to seal the pleural space and prevent fluid buildup (pleurodesis). °· Chemotherapy and radiation therapy, if you have cancerous (malignant) pleural effusion. These treatments are typically used to treat cancer. They kill certain cells in the body. °Follow these instructions at home: °· Take over-the-counter and prescription medicines only as told by your health care provider. °· Ask your health care provider what activities are safe for you. °· Keep track of how long you are able to do mild exercise (such as walking) before you get short of breath. Write down this information to share with your health care provider. Your ability to exercise should improve over time. °· Do not use any products that contain nicotine or tobacco, such as cigarettes and e-cigarettes. If you need help quitting, ask your health care provider. °· Keep all follow-up visits as told by your health care provider. This is important. °Contact a health care provider if: °· The amount of time that you are able to do mild exercise: °? Decreases. °? Does not improve with time. °· You have a fever. °Get help right away if: °· You are short of breath. °· You develop chest pain. °· You develop a new cough. °Summary °· Pleural effusion is an abnormal buildup of fluid in the layers   of tissue between the lungs and the inside of the chest. °· Pleural effusion can have many causes, including heart failure, pulmonary embolism, infections, or cancer. °· Symptoms of pleural effusion can include shortness of breath, chest pain, fever, long-lasting (chronic) cough, hiccups, or rapid  breathing. °· Diagnosis often involves making images of the chest (such as with ultrasound or X-ray) and removing fluid (thoracentesis) to send for testing. °· Treatment for pleural effusion depends on what underlying condition is causing it. °This information is not intended to replace advice given to you by your health care provider. Make sure you discuss any questions you have with your health care provider. °Document Released: 01/16/2005 Document Revised: 12/29/2016 Document Reviewed: 09/21/2016 °[employer] Patient Education © 2020 [employer] ° °

## 2018-11-10 NOTE — Plan of Care (Signed)
  Problem: Nutrition: Goal: Adequate nutrition will be maintained Outcome: Progressing   Problem: Elimination: Goal: Will not experience complications related to bowel motility Outcome: Progressing   Problem: Pain Managment: Goal: General experience of comfort will improve Outcome: Progressing   

## 2018-11-10 NOTE — Discharge Summary (Signed)
Physician Discharge Summary  Beverly Tucker [id number] DOB: 10-25-1940 DOA: 11/05/2018  PCP: [name], MD  Admit date: 11/05/2018 Discharge date: 11/10/2018  Time spent: 37 minutes  Recommendations for Outpatient Follow-up:  1. Needs Chem-12, CBC 1 week 2. Needs repeat chest x-ray 2 to 3 weeks or as per oncology/IR 3. Outpatient follow-up with oncologist already scheduled-needs further discussions about chemo etc. 4. Note discontinuation of Zestoretic this admission  Discharge Diagnoses:  Principal Problem:   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 Active Problems:   HTN (hypertension)   Metastatic breast cancer (HCC)   AKI (acute kidney injury) ([place])   AF (paroxysmal atrial fibrillation) (HCC)   Pleural effusion   Malignant pleural effusion   Discharge Condition: Fair  Diet recommendation: Heart healthy  Filed Weights   11/05/18 2117  Weight: 84.4 kg    History of present illness:  78WFrecent adm c R metastatic breast cancer-biopsied 9/16 adenocarcinoma-5.7 X3.5 X4.9R mass with fungating tumor-? Enhancement central cord MRI deferred secondary to patient preference-underwent thoracentesis x2, first one 9/15 =900 cc second one 9/22 =822 cc--echo did not show diastolic heart failure Complications of A. fib RVR during hospital stay started on metoprolol Cardizem apixaban. Returns to the hospital-shortness of breath-CXR = moderate to large left pleural effusion  Hospital Course:  Recurrent pleural effusion-thoracentesis 10/7 no labs needed at the time-my review of x-ray 10/8 shows some accumulation-IR placed pleurx 10/9 pain remains uncontrolled on oral tramadol therefore changed to Percocet 7.5 and pain is better controlled-given a prescription for about 7 days and she can continue this in the outpatient setting  Leukocytosis,?  UTI-had some dysuria 10/8 urine culture grew relatively sensitive E. coli so I sent her home on Macrodantin which she will complete at  home  Metastatic right breast cancer/fungating-await start of chemo as outpatient looks like approval has been performed-Dr. [name] saw the patient in consult and will set up an outpatient follow-up appointment and  Atrial fibrillation on Eliquis--Eliquis resumed prior to discharge-continue metoprolol 25 twice daily she is rate controlled  AKI on admission now resolved-NSL IV-labs a.m.   HTN holding lisinopril/HCTZ continue meds as above  Procedures:  Pleurx catheter placed 10/9  Consultations:  Oncologist  Discharge Exam: Vitals:   11/10/18 0933 11/10/18 0933  BP: (!) 131/58 (!) 131/58  Pulse:  81  Resp:  16  Temp:    SpO2:  95%    General: Awake alert coherent no distress seems to be more stable less dizzy and with ambulation no new issues Cardiovascular: S1-S2 no murmur rub or gallop Respiratory: Clinically clear no added sound no rales no rhonchi Abdomen soft nontender no rebound No lower extremity edema Neurologically intact  Discharge Instructions   Discharge Instructions    Diet - low sodium heart healthy   Complete by: As directed    Discharge instructions   Complete by: As directed    We have prescribed for you an antibiotic because he had a urinary infection-would recommend that you take these tablets at least for the next 3 to 5 days to treat your infection We have also prescribed Percocet which is a pain medication to assist you with pain from the area where your drain was placed-you can get refills from your oncologist or primary care physician you will get a 7-day supply on discharge Please follow-up with your outpatient physicians as I have discontinued your Zestoretic which is a blood pressure medicine as you are a little bit dehydrated when you came in Please follow  instructions carefully from your oncologist Dr. [name] regarding your cancer treatment and follow-up with him Get labs in about 1 week at your primary physician office   Increase activity  slowly   Complete by: As directed      Allergies as of 11/10/2018   No Known Allergies     Medication List    STOP taking these medications   lisinopril-hydrochlorothiazide 10-12.5 MG tablet Commonly known as: ZESTORETIC     TAKE these medications   abemaciclib 50 MG tablet Commonly known as: Verzenio Take 1 tablet (50 mg total) by mouth 2 (two) times daily. Swallow tablets whole. Do not chew, crush, or split tablets before swallowing.   ALPRAZolam 0.5 MG tablet Commonly known as: XANAX Take 0.25-0.5 mg by mouth 2 (two) times daily as needed for anxiety.   apixaban 5 MG Tabs tablet Commonly known as: ELIQUIS Take 1 tablet (5 mg total) by mouth 2 (two) times daily.   ergocalciferol 1.25 MG (50000 UT) capsule Commonly known as: VITAMIN D2 Take 1 capsule (50,000 Units total) by mouth once a week.   letrozole 2.5 MG tablet Commonly known as: FEMARA Take 1 tablet (2.5 mg total) by mouth daily.   metoprolol tartrate 25 MG tablet Commonly known as: LOPRESSOR Take 1 tablet (25 mg total) by mouth 2 (two) times daily.   nitrofurantoin 100 MG capsule Commonly known as: MACRODANTIN Take 1 capsule (100 mg total) by mouth 2 (two) times daily for 3 days.   oxyCODONE-acetaminophen 7.5-325 MG tablet Commonly known as: PERCOCET Take 1 tablet by mouth every 4 (four) hours as needed for up to 7 days for moderate pain.      No Known Allergies    The results of significant diagnostics from this hospitalization (including imaging, microbiology, ancillary and laboratory) are listed below for reference.    Significant Diagnostic Studies: Dg Eye Foreign Body  Result Date: 10/21/2018 CLINICAL DATA:  Previous unspecified eye surgery; clearance prior to MRI EXAM: ORBITS FOR FOREIGN BODY - 2 VIEW COMPARISON:  None. FINDINGS: There is no evidence of metallic foreign body within the orbits. No significant bone abnormality identified. IMPRESSION: No evidence of metallic foreign body within  the orbits. Electronically Signed   By: Logan Bores M.D.   On: 10/21/2018 16:34   Dg Chest 1 View  Result Date: 11/06/2018 CLINICAL DATA:  Post left-sided thoracentesis. EXAM: CHEST  1 VIEW COMPARISON:  Radiographs 11/05/2018 and 10/22/2018. FINDINGS: 1344 hours. The left pleural effusion has decreased in volume. There is improved aeration of the left lung base. There is a stable small right pleural effusion and mild right basilar atelectasis. The heart size and mediastinal contours are stable with aortic atherosclerosis. No pneumothorax. IMPRESSION: 1. Decreased left pleural effusion and improved aeration of the left lung base status post thoracentesis. No pneumothorax. 2. Stable small right pleural effusion and right basilar atelectasis. Electronically Signed   By: Richardean Sale M.D.   On: 11/06/2018 14:16   Dg Chest 1 View  Result Date: 10/22/2018 CLINICAL DATA:  Status post left thoracentesis. EXAM: CHEST  1 VIEW COMPARISON:  Radiograph of same day. FINDINGS: Stable cardiomegaly. No pneumothorax is noted. Left pleural effusion is significantly smaller status post thoracentesis. Mild right basilar atelectasis is noted with probable small right pleural effusion. Bony thorax is unremarkable. IMPRESSION: Left pleural effusion is significantly smaller status post thoracentesis. No pneumothorax is noted. Electronically Signed   By: Marijo Conception M.D.   On: 10/22/2018 12:57   Dg Chest 2  View  Result Date: 11/08/2018 CLINICAL DATA:  Left pleural effusion. Insertion of PleurX catheter this morning. Coughing after the procedure. EXAM: CHEST - 2 VIEW COMPARISON:  Radiographs dated 11/07/2018 and 11/06/2018 FINDINGS: There is a tiny left apical pneumothorax. PleurX catheter in place. Minimal residual pleural effusion. There is a small right pleural effusion. Chronic cardiomegaly. Pulmonary vascularity is within normal limits. No acute bone abnormality. Chronic accentuation of the thoracic kyphosis.  IMPRESSION: 1. Tiny left apical pneumothorax. PleurX catheter in place. 2. Small right pleural effusion.  Minimal residual left effusion. Electronically Signed   By: Lorriane Shire M.D.   On: 11/08/2018 12:23   Dg Chest 2 View  Result Date: 11/07/2018 CLINICAL DATA:  Left thoracentesis yesterday EXAM: CHEST - 2 VIEW COMPARISON:  11/06/2018 FINDINGS: Moderate left pleural effusion appears slightly larger. No pneumothorax. Consolidation left lower lobe shows progression Small right effusion and mild right lower lobe airspace disease unchanged. Negative for heart failure or edema. IMPRESSION: Progression of left effusion and left lower lobe consolidation following left thoracentesis. No pneumothorax. Electronically Signed   By: Franchot Gallo M.D.   On: 11/07/2018 09:53   Dg Chest 2 View  Result Date: 11/05/2018 CLINICAL DATA:  Shortness of breath EXAM: CHEST - 2 VIEW COMPARISON:  October 22, 2018 FINDINGS: There is mild cardiomegaly. A moderate to large left pleural effusion is seen. There is trace right pleural effusion. Mildly increased interstitial markings seen predominantly at the right lung base, likely chronic lung changes. Air not calcifications. IMPRESSION: Moderate to large left and trace right pleural effusion. Electronically Signed   By: Prudencio Pair M.D.   On: 11/05/2018 22:09   Dg Abd 1 View  Result Date: 10/16/2018 CLINICAL DATA:  Increased SOB as well as nausea and emesis. Hx of CHF, HTN, GERD. EXAM: ABDOMEN - 1 VIEW COMPARISON:  None. FINDINGS: The bowel gas pattern is normal. No radio-opaque calculi or other significant radiographic abnormality are seen. IMPRESSION: Negative. Electronically Signed   By: Nolon Nations M.D.   On: 10/16/2018 18:36   Ct Chest Wo Contrast  Result Date: 10/14/2018 CLINICAL DATA:  78 year old female with past medical history of hypertension who presents with a 3-week history of progressive worsening of shortness of breath. Patient reports that symptoms  started with a runny nose and cough productive of clear white mucus. Patient saw outpatient provider, was COVID negative, was started on allergy medication. Shortness of breath continue to progress, saw provider on 9/12, was diagnosed with pneumonia, and started on Keflex. At that visit, patient was also started on doxycycline for cellulitis (under right breast). EXAM: CT CHEST WITHOUT CONTRAST TECHNIQUE: Multidetector CT imaging of the chest was performed following the standard protocol without IV contrast. COMPARISON:  Current chest radiograph. FINDINGS: Cardiovascular: Mild enlargement of the heart. No pericardial effusion. Three-vessel coronary artery calcifications. Great vessels normal in caliber. There are aortic atherosclerotic calcifications. Mediastinum/Nodes: No neck base masses or enlarged lymph nodes. No mediastinal or hilar masses or adenopathy. Trachea and esophagus are unremarkable. Lungs/Pleura: Moderate-sized left and small right pleural effusions. Dependent atelectasis in the lower lobes, left greater than right. Mild interstitial thickening most evident the lower lobes. Linear opacities are noted in the left upper lobe posteriorly and in the lingula consistent with atelectasis. There are a few small irregular nodules, largest subpleural, right lower lobe, image 81, series 4, 6 mm. Mild scarring at the apices. No pneumothorax. Upper Abdomen: No acute findings. Status post cholecystectomy. Aortic atherosclerosis. Musculoskeletal: No fracture or acute finding.  No osteoblastic or osteolytic lesions. Chest wall: There is masslike soft tissue that is contiguous with the skin medial aspect of the right breast, measuring approximately 5.7 x 3.5 x 4.9 cm. There is more generalized breast skin thickening bilaterally. Mildly prominent bilateral axillary lymph nodes are noted, none pathologically enlarged by size criteria. IMPRESSION: 1. No convincing pneumonia. 2. Moderate left and small right pleural  effusions. There is mild bilateral interstitial thickening most evident in the lower lungs. This suggests mild interstitial edema. 3. Dependent atelectasis in the lower lobes, left greater than right, with milder atelectasis in the left upper lobe. 4. Small lung nodules most evident in the right middle lobe. 5. Abnormal soft tissue contiguous with the skin of the medial right breast. Although this may be infectious/inflammatory, neoplastic disease should be considered. Consider follow-up diagnostic mammography and possible right breast ultrasound for further assessment. Aortic Atherosclerosis (ICD10-I70.0). Electronically Signed   By: Lajean Manes M.D.   On: 10/14/2018 20:05   Ct Angio Chest Pe W Or Wo Contrast  Result Date: 10/16/2018 CLINICAL DATA:  Urgent patient brought down for PE due to SOB RN stated she had thoracentesis and breast bx and is now SOB and in a EXAM: CT ANGIOGRAPHY CHEST WITH CONTRAST TECHNIQUE: Multidetector CT imaging of the chest was performed using the standard protocol during bolus administration of intravenous contrast. Multiplanar CT image reconstructions and MIPs were obtained to evaluate the vascular anatomy. CONTRAST:  56mL OMNIPAQUE IOHEXOL 350 MG/ML SOLN COMPARISON:  Chest x-ray on 10/16/2018 FINDINGS: Cardiovascular: Heart size is mildly enlarged. There is atherosclerotic calcification of the coronary arteries. Calcification of the mitral annulus. Trace pericardial effusion. The pulmonary arteries are well opacified by contrast bolus and there is no evidence for acute pulmonary embolus. Note is made of atherosclerotic calcification of the thoracic aorta not associated with aneurysm. Mediastinum/Nodes: The visualized portion of the thyroid gland has a normal appearance. Esophagus is normal/in appearance. There are 3 level I RIGHT axillary lymph nodes with abnormal morphology, there small axillary lymph nodes bilaterally, measuring up to 1 centimeter in diameter. No mediastinal  or hilar adenopathy. Lungs/Pleura: There is a small anterior LEFT pneumothorax, estimated to be 5-10% of lung volume. Persistent LEFT pleural effusion and associated atelectasis. Trace RIGHT pleural effusion and basilar atelectasis. Upper Abdomen: No acute abnormality. Musculoskeletal: Thoracic kyphosis. No suspicious lytic or blastic lesions are identified. Soft tissues: Irregularity, skin thickening, and asymmetry within the inferior and MEDIAL portion of the RIGHT breast, suspicious for malignancy. Small subcutaneous nodule is identified below the RIGHT breast, measuring 8 millimeters on image 110/5 Review of the MIP images confirms the above findings. IMPRESSION: 1. Technically adequate exam showing no acute pulmonary embolus. 2. Small LEFT pneumothorax, estimated to be 5-10% of lung volume. 3. Small to moderate LEFT pleural effusion and associated atelectasis. 4. Trace RIGHT pleural effusion and basilar atelectasis. 5. Cardiomegaly and coronary artery disease. 6. RIGHT breast skin thickening and irregularity suspicious for malignancy. Recommend diagnostic mammography and ultrasound when the patient is able. 7. Small bilateral axillary lymph nodes, 1 centimeter in diameter. 8. Aortic Atherosclerosis (ICD10-I70.0). These results were called by telephone at the time of interpretation on 10/16/2018 at 7:52 pm to provider Dr. Marianna Payment, Who verbally acknowledged these results. Electronically Signed   By: Nolon Nations M.D.   On: 10/16/2018 19:53   Mr Jeri Cos [id number] Contrast  Result Date: 10/21/2018 CLINICAL DATA:  Breast cancer staging. EXAM: MRI HEAD WITHOUT AND WITH CONTRAST TECHNIQUE: Multiplanar, multiecho pulse sequences of the  brain and surrounding structures were obtained without and with intravenous contrast. CONTRAST:  81mL GADAVIST GADOBUTROL 1 MMOL/ML IV SOLN COMPARISON:  None. FINDINGS: Brain: Mild cerebral atrophy. Negative for hydrocephalus. Negative for acute or chronic infarct. Negative for hemorrhage,  mass, or edema. Normal enhancement postcontrast infusion.  No enhancing mass lesion. Vascular: Normal arterial flow voids Skull and upper cervical spine: Negative Sinuses/Orbits: Negative Other: None IMPRESSION: Negative MRI brain with contrast.  Negative for metastatic disease. Electronically Signed   By: Franchot Gallo M.D.   On: 10/21/2018 17:47   Ct Abdomen Pelvis W Contrast  Result Date: 10/22/2018 CLINICAL DATA:  History of breast cancer, breast cancer staging with suspicion for metastatic disease. EXAM: CT ABDOMEN AND PELVIS WITH CONTRAST TECHNIQUE: Multidetector CT imaging of the abdomen and pelvis was performed using the standard protocol following bolus administration of intravenous contrast. CONTRAST:  172mL OMNIPAQUE IOHEXOL 300 MG/ML  SOLN COMPARISON:  None. FINDINGS: Lower chest: Opacification of the left inferior chest due to large left-sided pleural effusion with basilar collapse, nodularity in the costodiaphragmatic sulcus best seen on sagittal images the left chest. Variable enhancement of collapsed lung tissue is incompletely imaged. Basilar atelectasis noted on the right with nodular opacity at the right lung base. Heart is incompletely imaged, no visualized pericardial fluid or nodularity. Hepatobiliary: Subtle low-attenuation in the medial section of the left hepatic lobe (hepatic subsegment IV B) best seen on image 20, series 3 measuring approximately 1.5 x 1.4 cm. Post cholecystectomy without signs of biliary ductal dilation. Pancreas: Unremarkable. No pancreatic ductal dilatation or surrounding inflammatory changes. Spleen: No signs of focal splenic lesion. Adrenals/Urinary Tract: Adrenal glands are normal. No signs of hydronephrosis. Symmetric renal enhancement. Cyst arises from the lower pole the left kidney. Stomach/Bowel: Stomach is normal. Small bowel with normal caliber, no signs of acute small bowel process. A normal appendix. Moderate size periumbilical ventral hernia contains  the midportion of the transverse colon, no signs of obstruction at this time. Moderate sigmoid diverticulosis with mild fascial thickening along the posterior aspect of sigmoid colon in the sigmoid mesocolon. Vascular/Lymphatic: Moderate to marked calcific atherosclerosis of the abdominal aorta extending into the iliac vessels. No signs of aneurysm in the abdomen. No retroperitoneal adenopathy. Scattered lymph nodes are present but without pathologic size. No signs of pelvic lymphadenopathy. Reproductive: Thickening of the endometrium beyond what is expected for this 78 year old patient measuring nearly 2 cm as estimated by CT. Other: Urinary bladder is decompressed. Hernia as discussed above. No signs of acute process in the body wall with areas of gas in the subcutaneous fat likely related to prior injection. Bowing of levator ani musculature with descent of anterior middle and posterior compartment (bladder base, uterus-vagina and rectum) visceral contents. (Findings are indicative of pelvic floor dysfunction and should be correlated clinically. Musculoskeletal: Mixed lytic and sclerotic process in the right ischium measures 1.8 x 1.6 cm (image 74, series 3). Mixed lytic and sclerotic changes in the right eighth and ninth ribs with subacute fractures at the lateral margin of these ribs. Cortical destruction the medial cortex of the right tenth rib. Question of increased enhancement within the central canal along the distal aspect of the spinal cord and filum terminalis. Spinal degenerative changes. Small sclerotic focus at the L1 level best seen on sagittal images. Grade 1 anterolisthesis of L5 on S1 with pars defects at this level the left. Vertebral hemangioma at L3. IMPRESSION: 1. Large malignant left-sided effusion. Trace effusion on the right with basilar atelectasis. 2. Variation in  parenchymal enhancement raising the question of hilar lesion in the left chest, consider CT chest if not performed for further  evaluation. 3. 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 4. Endometrial thickening may represent a second primary, consider endovaginal sonogram for further assessment as warranted. 5. Question of added enhancement in these central canal along the spinal cord internal nerve roots. This could be seen in the setting of CNS involvement but is not certain. MRI of the spine for additional staging could be performed as warranted. 6. Area of vague hypoattenuation anterior and inferior to portal structures in medial section left hepatic lobe likely an area of fatty infiltration, attention on follow-up or assess with MR for definitive characterization. Electronically Signed   By: Zetta Bills M.D.   On: 10/22/2018 08:26   Dg Chest Port 1 View  Result Date: 11/09/2018 CLINICAL DATA:  Short of breath and weakness. EXAM: PORTABLE CHEST 1 VIEW COMPARISON:  1 day prior FINDINGS: Left-sided PleurX catheter remains in place. The Chin overlies the apices. Moderate cardiomegaly. Increase in small left pleural effusion. Given limitations of chin overlying the left apex, no residual pneumothorax is identified. Similar right and worsened left base airspace disease. Suspect developing mild interstitial edema. IMPRESSION: Worsened aeration, with probable developing interstitial edema and progressive bibasilar airspace disease. Left pleural drain in place, without convincing evidence of residual pneumothorax. Electronically Signed   By: Abigail Miyamoto M.D.   On: 11/09/2018 10:53   Dg Chest Port 1 View  Result Date: 10/22/2018 CLINICAL DATA:  Pleural effusion. EXAM: PORTABLE CHEST 1 VIEW COMPARISON:  10/16/2018. FINDINGS: Stable cardiomegaly. No pulmonary venous congestion. Prominent left-sided pleural effusion, progressed from prior exam. Underlying left lower lung atelectasis/infiltrate most likely present. Atelectatic  changes right lung base also again noted. IMPRESSION: 1.  Stable cardiomegaly.  No pulmonary venous congestion. 2. Prominent left-sided pleural effusion, progressed from prior exam. Underlying left lower lobe atelectasis/infiltrate most likely present. Atelectatic changes right lung base also again noted. Electronically Signed   By: Marcello Moores  Register   On: 10/22/2018 10:03   Dg Chest Port 1 View  Result Date: 10/16/2018 CLINICAL DATA:  Increased SOB as well as nausea and emesis. Hx of CHF, HTN, GERD. EXAM: PORTABLE CHEST 1 VIEW COMPARISON:  10/16/2018 FINDINGS: The heart is enlarged and stable in configuration. LEFT pleural effusion and LEFT basilar opacity are stable. Atelectasis or early infiltrate at the RIGHT lung base. No pulmonary edema. IMPRESSION: Stable appearance of the chest. Electronically Signed   By: Nolon Nations M.D.   On: 10/16/2018 18:32   Dg Chest Port 1 View  Result Date: 10/16/2018 CLINICAL DATA:  Productive cough, shortness of breath EXAM: PORTABLE CHEST 1 VIEW COMPARISON:  10/15/2018 FINDINGS: Slight interval increase in left-sided pleural effusion with associated atelectasis or consolidation. There is increased, subtle heterogeneous opacity at the right lung base. Cardiomegaly. IMPRESSION: Slight interval increase in left-sided pleural effusion with associated atelectasis or consolidation. There is increased, subtle heterogeneous opacity at the right lung base. Findings remain consistent with infection or aspiration Electronically Signed   By: Eddie Candle M.D.   On: 10/16/2018 15:13   Dg Chest Port 1 View  Result Date: 10/15/2018 CLINICAL DATA:  Follow-up LEFT pneumothorax following thoracentesis. EXAM: PORTABLE CHEST 1 VIEW COMPARISON:  Film earlier this day FINDINGS: The very small LEFT pneumothorax is no longer identified. Mild bibasilar atelectasis again noted. Cardiomegaly is present. No other  changes noted. IMPRESSION: No visualized pneumothorax. Mild basilar atelectasis.  Electronically Signed   By: Margarette Canada M.D.   On: 10/15/2018 19:11   Dg Chest Portable 1 View  Result Date: 10/15/2018 CLINICAL DATA:  S/p LEFT thoracentesis. EXAM: PORTABLE CHEST 1 VIEW COMPARISON:  10/14/2018 chest radiograph and CT FINDINGS: Near-complete resolution of visualized LEFT pleural effusion with very small apical and basilar pneumothorax. Bibasilar atelectasis again noted. Cardiomegaly again noted. IMPRESSION: Very small LEFT apical and basilar pneumothorax following LEFT thoracentesis. Near complete resolution of visualized LEFT pleural effusion. Electronically Signed   By: Margarette Canada M.D.   On: 10/15/2018 15:28   Dg Chest Port 1 View  Result Date: 10/14/2018 CLINICAL DATA:  Shortness of breath EXAM: PORTABLE CHEST 1 VIEW COMPARISON:  Chest radiograph dated 10/05/2018. FINDINGS: The heart appears enlarged accounting for technique. There is a small a moderate left pleural effusion with associated atelectasis/airspace disease. There is mild right basilar atelectasis/airspace disease. There is no pneumothorax. The visualized skeletal structures are unremarkable. IMPRESSION: Small to moderate left pleural effusion with associated atelectasis/airspace disease. Mild right basilar atelectasis/airspace disease. Cardiomegaly. Electronically Signed   By: Zerita Boers M.D.   On: 10/14/2018 14:38   Ir Thoracentesis Asp Pleural Space W/img Guide  Result Date: 10/22/2018 INDICATION: Patient with history of CHF, chronic kidney disease, metastatic breast cancer, dyspnea, recurrent left pleural effusion. Request made for diagnostic and therapeutic left thoracentesis. EXAM: ULTRASOUND GUIDED DIAGNOSTIC AND THERAPEUTIC LEFT THORACENTESIS MEDICATIONS: None COMPLICATIONS: None immediate. PROCEDURE: An ultrasound guided thoracentesis was thoroughly discussed with the patient and questions answered. The benefits, risks, alternatives and complications were also discussed. The patient understands and wishes to  proceed with the procedure. Written consent was obtained. Ultrasound was performed to localize and mark an adequate pocket of fluid in the left chest. The area was then prepped and draped in the normal sterile fashion. 1% Lidocaine was used for local anesthesia. Under ultrasound guidance a 6 Fr Safe-T-Centesis catheter was introduced. Thoracentesis was performed. The catheter was removed and a dressing applied. FINDINGS: A total of approximately 820 cc of hazy, amber fluid was removed. Samples were sent to the laboratory as requested by the clinical team. IMPRESSION: Successful ultrasound guided diagnostic and therapeutic left thoracentesis yielding 820 cc of pleural fluid. Read by: Rowe Robert, PA-C Electronically Signed   By: Sandi Mariscal M.D.   On: 10/22/2018 12:25   Ir Perc Pleural Drain W/[place]well Cath W/img Guide  Result Date: 11/08/2018 INDICATION: 78 year old female with stage IV breast cancer and a recurrent symptomatic left-sided malignant pleural effusion. She presents for placement of a tunneled pleural drainage catheter. EXAM: Placement of a tunneled pleural drainage catheter MEDICATIONS: 2 g Ancef ANESTHESIA/SEDATION: Fentanyl 50 mcg IV; Versed 1.5 mg IV Moderate Sedation Time:  20 minutes The patient was continuously monitored during the procedure by the interventional radiology nurse under my direct supervision. COMPLICATIONS: None immediate. PROCEDURE: Informed written consent was obtained from the patient after a thorough discussion of the procedural risks, benefits and alternatives. All questions were addressed. Maximal Sterile Barrier Technique was utilized including caps, mask, sterile gowns, sterile gloves, sterile drape, hand hygiene and skin antiseptic. A timeout was performed prior to the initiation of the procedure. Ultrasound was used to interrogate the left chest. A large pleural effusion is present. A suitable skin entry site was selected and marked. Local anesthesia was attained by  infiltration with 1% lidocaine. A small dermatotomy was made. An 18 gauge sheath needle was then advanced over a rib  and into the pleural space. The sheath was left in place while the 18 gauge needle was removed. A short Amplatz wire was advanced through the sheath needle and positioned in the pleural space. A suitable skin exit site 5 cm anterior and medial to the pleural entry site was identified. Local anesthesia was again attained by infiltration with 1% lidocaine. A second small dermatotomy was made. The pleural drainage catheter was then tunneled from the exit site to the dermatotomy overlying the pleural entry site. The sheath needle was then removed over the wire. A peel-away sheath was advanced over the wire and into the pleural space. The tunneled drainage catheter was then advanced through the peel-away sheath and the peel-away sheath was discarded. The catheter was connected to a vacuum bottle and approximately 700 mL of pleural fluid was evacuated. Further aspiration was stopped due to symptomatic coughing. The catheter was secured to the skin with 0 Prolene suture. The dermatotomy overlying the pleural entry site was closed with Dermabond. Sterile bandages were placed. IMPRESSION: Technically successful placement of a left tunneled pleural drainage catheter. Signed, Criselda Peaches, MD, [facility] Electronically Signed   By: Jacqulynn Cadet M.D.   On: 11/08/2018 10:05   US Thoracentesis Asp Pleural Space W/img Guide  Result Date: 11/06/2018 INDICATION: Patient with history of CHF, chronic kidney disease, dyspnea, metastatic breast cancer, recurrent left pleural effusion. Request made for therapeutic left thoracentesis. EXAM: ULTRASOUND GUIDED THERAPEUTIC LEFT THORACENTESIS MEDICATIONS: None COMPLICATIONS: None immediate. PROCEDURE: An ultrasound guided thoracentesis was thoroughly discussed with the patient and questions  answered. The benefits, risks, alternatives and complications were also discussed. The patient understands and wishes to proceed with the procedure. Written consent was obtained. Ultrasound was performed to localize and mark an adequate pocket of fluid in the left chest. The area was then prepped and draped in the normal sterile fashion. 1% Lidocaine was used for local anesthesia. Under ultrasound guidance a 6 Fr Safe-T-Centesis catheter was introduced. Thoracentesis was performed. The catheter was removed and a dressing applied. FINDINGS: A total of approximately 900 cc of amber/blood-tinged fluid was removed. Due to persistent patient coughing only the above amount of fluid was removed today. IMPRESSION: Successful ultrasound guided therapeutic left thoracentesis yielding 900 cc of pleural fluid. Read by: Rowe Robert, PA-C Electronically Signed   By: Jacqulynn Cadet M.D.   On: 11/06/2018 14:11    Microbiology: Recent Results (from the past 240 hour(s))  Novel Coronavirus, NAA (Hosp order, Send-out to Ref Lab; TAT 18-24 hrs     Status: None   Collection Time: 11/05/18  8:06 AM   Specimen: Nasopharyngeal Swab; Respiratory  Result Value Ref Range Status   SARS-CoV-2, NAA NOT DETECTED NOT DETECTED Final    Comment: (NOTE) This nucleic acid amplification test was developed and its performance characteristics determined by [employer]. Nucleic acid amplification tests include PCR and TMA. This test has not been FDA cleared or approved. This test has been authorized by FDA under an Emergency Use Authorization (EUA). This test is only authorized for the duration of time the declaration that circumstances exist justifying the authorization of the emergency use of in vitro diagnostic tests for detection of SARS-CoV-2 virus and/or diagnosis of COVID-19 infection under section 564(b)(1) of the Act, 21 U.S.C. [id number]) (1), unless the authorization is terminated or revoked sooner. When  diagnostic testing is negative, the possibility of a false negative result should be considered in the context of a patient's recent  exposures and the presence of clinical signs and symptoms consistent with COVID-19. An individual without symptoms of COVID- 19 and who is not shedding SARS-CoV-2 vi rus would expect to have a negative (not detected) result in this assay. Performed At: [facility] [address] [place], [place] [id number] Rush Farmer MD [id number]    [place]  Final    Comment: Performed at [facility], [place] [address], [place], [place] [address]  SARS CORONAVIRUS 2 (TAT 6-24 HRS) Nasopharyngeal Nasopharyngeal Swab     Status: None   Collection Time: 11/05/18 11:53 PM   Specimen: Nasopharyngeal Swab  Result Value Ref Range Status   SARS Coronavirus 2 NEGATIVE NEGATIVE Final    Comment: (NOTE) SARS-CoV-2 target nucleic acids are NOT DETECTED. The SARS-CoV-2 RNA is generally detectable in upper and lower respiratory specimens during the acute phase of infection. Negative results do not preclude SARS-CoV-2 infection, do not rule out co-infections with other pathogens, and should not be used as the sole basis for treatment or other patient management decisions. Negative results must be combined with clinical observations, patient history, and epidemiological information. The expected result is Negative. Fact Sheet for Patients: SugarRoll.be Fact Sheet for Healthcare Providers: https://www.woods-mathews.com/ This test is not yet approved or cleared by the [place] FDA and  has been authorized for detection and/or diagnosis of SARS-CoV-2 by FDA under an Emergency Use Authorization (EUA). This EUA will remain  in effect (meaning this test can be used) for the duration of the COVID-19 declaration under Section 56 4(b)(1) of the Act, 21 U.S.C. section 360bbb-3(b)(1), unless the  authorization is terminated or revoked sooner. Performed at [facility], [place] [address] [place], [place] [address]   Culture, Urine     Status: Abnormal   Collection Time: 11/07/18 10:58 PM   Specimen: Urine, Clean Catch  Result Value Ref Range Status   Specimen Description   Final    URINE, CLEAN CATCH Performed at [facility], [place] [address] [place], [place] [address]    Special Requests   Final    NONE Performed at [facility], [place] [address], [place], [place] [address]    Culture >=100,000 COLONIES/mL ESCHERICHIA COLI (A)  Final   Report Status 11/10/2018 FINAL  Final   Organism ID, Bacteria ESCHERICHIA COLI (A)  Final      Susceptibility   Escherichia coli - MIC*    AMPICILLIN >=32 RESISTANT Resistant     CEFAZOLIN 16 SENSITIVE Sensitive     CEFTRIAXONE <=1 SENSITIVE Sensitive     CIPROFLOXACIN >=4 RESISTANT Resistant     GENTAMICIN <=1 SENSITIVE Sensitive     IMIPENEM <=0.25 SENSITIVE Sensitive     NITROFURANTOIN <=16 SENSITIVE Sensitive     TRIMETH/SULFA >=320 RESISTANT Resistant     AMPICILLIN/SULBACTAM >=32 RESISTANT Resistant     PIP/TAZO <=4 SENSITIVE Sensitive     Extended ESBL NEGATIVE Sensitive     * >=100,000 COLONIES/mL ESCHERICHIA COLI     Labs: Basic Metabolic Panel: Recent Labs  Lab 11/05/18 2353 11/06/18 0539 11/07/18 0524 11/08/18 0745 11/09/18 0610  NA 138 140 138 137  137 136  K 4.1 4.0 4.1 4.0  4.0 4.2  CL 99 99 100 96*  96* 96*  CO2 29 30 29 30  30 30   GLUCOSE 108* 107* 101* 110*  111* 128*  BUN 24* 24* 22 18  19 19   CREATININE 1.37* 1.27* 1.17* 1.14*  1.12* 1.07*  CALCIUM  9.0 8.7* 8.6* 8.7*  8.7* 8.6*  PHOS  --   --   --  3.7 3.6   Liver Function Tests: Recent Labs  Lab 11/05/18 2353 11/08/18 0745 11/09/18 0610  AST 21  --   --   ALT 26  --   --   ALKPHOS 80  --   --   BILITOT 0.4  --   --   PROT 7.3  --   --   ALBUMIN 3.7 2.8* 2.7*   No results for input(s): LIPASE,  AMYLASE in the last 168 hours. No results for input(s): AMMONIA in the last 168 hours. CBC: Recent Labs  Lab 11/05/18 2353 11/07/18 0524 11/09/18 0610  WBC 12.7* 8.6 10.4  NEUTROABS 9.5* 6.2  --   HGB 11.5* 9.8* 10.1*  HCT 38.9 33.3* 33.9*  MCV 96.5 95.7 95.5  PLT 347 259 250   Cardiac Enzymes: No results for input(s): CKTOTAL, CKMB, CKMBINDEX, TROPONINI in the last 168 hours. BNP: BNP (last 3 results) Recent Labs    10/05/18 1837 10/14/18 1440 11/05/18 2353  BNP 226.8* 458.5* 47.6    ProBNP (last 3 results) No results for input(s): PROBNP in the last 8760 hours.  CBG: No results for input(s): GLUCAP in the last 168 hours.     Signed:  Nita Sells MD   [facility] 11/10/2018, 10:36 AM

## 2018-11-11 NOTE — Telephone Encounter (Signed)
Oral Oncology Patient Advocate Encounter  Confirmed with [place] that Beverly Tucker was picked up on 11/09/18 with a $0 copay using the one time voucher.   [place] Patient [place]oco Phone [phone] Fax [phone] 11/11/2018   8:39 AM

## 2018-11-12 ENCOUNTER — Inpatient Hospital Stay: Payer: Medicare HMO

## 2018-11-12 ENCOUNTER — Telehealth: Payer: Self-pay | Admitting: *Deleted

## 2018-11-12 ENCOUNTER — Ambulatory Visit (HOSPITAL_COMMUNITY): Payer: Medicare HMO

## 2018-11-12 ENCOUNTER — Other Ambulatory Visit: Payer: Self-pay | Admitting: *Deleted

## 2018-11-12 DIAGNOSIS — C7951 Secondary malignant neoplasm of bone: Secondary | ICD-10-CM

## 2018-11-12 DIAGNOSIS — C50919 Malignant neoplasm of unspecified site of unspecified female breast: Secondary | ICD-10-CM

## 2018-11-12 NOTE — Telephone Encounter (Signed)
Son [name] called. Son called -patient come home from hospital Sunday. Started Verzenio yesterday, tolerating it. Has become increasingly weak since yesterday, not eating/drinking much per son (have tried ensure) and can barely hold head up. Patient asked son to cxl U/S and MRI scheduled for this afternoon, saying she can't do it. [contacted [facility] to cancel today's appts). He wants to know if he should take her back to hospital or wait to see if she regains strength. Advised son that it is hoped pt can be rescheduled for them this week as she has appt on Monday 10/19 to review results. He says he hopes patient will fell like going.

## 2018-11-12 NOTE — Telephone Encounter (Signed)
Information given to Dr.Kale - advised patient's son to take patient to ED if she becomes worse overnight or can come to [facility] in the morning 10/14. He states she is drinking a little and feeling better, so he will bring her to [facility] in the morning for labs and visit with PA. He asked if mother needs to take her chemo tonight - per Dr.Kale, advised that she can hold it until she is evaluated tomorrow. He verbalized understanding of instructions and appt.

## 2018-11-12 NOTE — Progress Notes (Signed)
Nutrition Assessment   Reason for Assessment: Referral from Dr. [name] regarding decreased appetite.   ASSESSMENT:  78 year old female with right breast cancer and malignant pleural effusion.  Noted hospitalization due to symptomatic recurrent pleural effusion requiring thoracentesis.  Past medical history of CHF, GERD, HTN.    Noted son [name] had called this am and spoke with RN, Katharine Look.  Spoke with Katharine Look before calling son to be sure appropriate to reach out to patient today.  Katharine Look encouraged calling son as patient is living with them since being discharged on Sunday.  Spoke with [name] and reports patient has had no appetite today and is weak.  Has only been able to get few sips of ensure in her today.  Reports last night patient was able to eat some soup.  Son reports 1 episode of diarrhea yesterday and 1 so far today.  Son concerned about no appetite and weakness.    Nutrition Focused Physical Exam: deferred   Medications: Vit D, verzenio, femara   Labs: creatinine 1.07, glucose 128, phosphorus 3.6   Anthropometrics:   Height: 66 inches Weight: 186 lb on 10/6 Noted weight of 192 lb 1.6 oz on 9/23 BMI: 30 3% weight loss in 3 weeks  Estimated Energy Needs  Kcals: 1800-2100 Protein: 90-105 g Fluid: 1.8-2 L   NUTRITION DIAGNOSIS: Inadequate oral intake related to hospital deconditioning, cancer, cancer related treatment side effects as evidenced by poor appetite, weight loss.   INTERVENTION:  Sent message to RN, Katharine Look regarding son's report of diarrhea.   Encouraged small frequent sips/snacks Discussed foods to consume with diarrhea.   Encouraged oral nutrition supplements as tolerated.   Encouraged son to call MD back if patient not improving.  Son has RD contact information.   MONITORING, EVALUATION, GOAL: Patient will consume adequate calories and protein to prevent weight loss   Next Visit: to be determined  Illyria Sobocinski B. Zenia Resides, [place], [place] Registered  Dietitian [phone] (pager)

## 2018-11-13 ENCOUNTER — Inpatient Hospital Stay (HOSPITAL_BASED_OUTPATIENT_CLINIC_OR_DEPARTMENT_OTHER): Payer: Medicare HMO | Admitting: Medical

## 2018-11-13 ENCOUNTER — Other Ambulatory Visit: Payer: Self-pay

## 2018-11-13 ENCOUNTER — Inpatient Hospital Stay: Payer: Medicare HMO

## 2018-11-13 ENCOUNTER — Encounter: Payer: Self-pay | Admitting: Hematology

## 2018-11-13 VITALS — BP 104/69 | HR 67 | Temp 98.0°F | Resp 18 | Ht 66.0 in | Wt 183.5 lb

## 2018-11-13 DIAGNOSIS — C50919 Malignant neoplasm of unspecified site of unspecified female breast: Secondary | ICD-10-CM

## 2018-11-13 DIAGNOSIS — E860 Dehydration: Secondary | ICD-10-CM | POA: Diagnosis not present

## 2018-11-13 DIAGNOSIS — C7951 Secondary malignant neoplasm of bone: Secondary | ICD-10-CM

## 2018-11-13 DIAGNOSIS — I471 Supraventricular tachycardia: Secondary | ICD-10-CM | POA: Diagnosis not present

## 2018-11-13 DIAGNOSIS — R630 Anorexia: Secondary | ICD-10-CM

## 2018-11-13 DIAGNOSIS — I130 Hypertensive heart and chronic kidney disease with heart failure and stage 1 through stage 4 chronic kidney disease, or unspecified chronic kidney disease: Secondary | ICD-10-CM | POA: Diagnosis not present

## 2018-11-13 DIAGNOSIS — J90 Pleural effusion, not elsewhere classified: Secondary | ICD-10-CM | POA: Diagnosis not present

## 2018-11-13 LAB — CMP (CANCER CENTER ONLY)
ALT: 16 U/L (ref 0–44)
AST: 18 U/L (ref 15–41)
Albumin: 2.7 g/dL — ABNORMAL LOW (ref 3.5–5.0)
Alkaline Phosphatase: 79 U/L (ref 38–126)
Anion gap: 9 (ref 5–15)
BUN: 19 mg/dL (ref 8–23)
CO2: 33 mmol/L — ABNORMAL HIGH (ref 22–32)
Calcium: 9 mg/dL (ref 8.9–10.3)
Chloride: 98 mmol/L (ref 98–111)
Creatinine: 1.28 mg/dL — ABNORMAL HIGH (ref 0.44–1.00)
GFR, Est AFR Am: 46 mL/min — ABNORMAL LOW (ref >60)
GFR, Estimated: 40 mL/min — ABNORMAL LOW (ref >60)
Glucose, Bld: 112 mg/dL — ABNORMAL HIGH (ref 70–99)
Potassium: 4 mmol/L (ref 3.5–5.1)
Sodium: 140 mmol/L (ref 135–145)
Total Bilirubin: 0.3 mg/dL (ref 0.3–1.2)
Total Protein: 6.5 g/dL (ref 6.5–8.1)

## 2018-11-13 LAB — CBC WITH DIFFERENTIAL (CANCER CENTER ONLY)
Abs Immature Granulocytes: 0.1 10*3/uL — ABNORMAL HIGH (ref 0.00–0.07)
Basophils Absolute: 0 10*3/uL (ref 0.0–0.1)
Basophils Relative: 0 %
Eosinophils Absolute: 0.2 10*3/uL (ref 0.0–0.5)
Eosinophils Relative: 1 %
HCT: 33.1 % — ABNORMAL LOW (ref 36.0–46.0)
Hemoglobin: 10.1 g/dL — ABNORMAL LOW (ref 12.0–15.0)
Immature Granulocytes: 1 %
Lymphocytes Relative: 10 %
Lymphs Abs: 1 10*3/uL (ref 0.7–4.0)
MCH: 28.4 pg (ref 26.0–34.0)
MCHC: 30.5 g/dL (ref 30.0–36.0)
MCV: 93 fL (ref 80.0–100.0)
Monocytes Absolute: 0.9 10*3/uL (ref 0.1–1.0)
Monocytes Relative: 9 %
Neutro Abs: 8.2 10*3/uL — ABNORMAL HIGH (ref 1.7–7.7)
Neutrophils Relative %: 79 %
Platelet Count: 317 10*3/uL (ref 150–400)
RBC: 3.56 MIL/uL — ABNORMAL LOW (ref 3.87–5.11)
RDW: 14.4 % (ref 11.5–15.5)
WBC Count: 10.4 10*3/uL (ref 4.0–10.5)
nRBC: 0 % (ref 0.0–0.2)

## 2018-11-13 MED ORDER — SODIUM CHLORIDE 0.9 % IV SOLN
Freq: Once | INTRAVENOUS | Status: AC
  Administered 2018-11-13: 11:00:00 via INTRAVENOUS
  Filled 2018-11-13: qty 250

## 2018-11-13 NOTE — Progress Notes (Signed)
PA Lucianne Lei updated pt's son [name] with pt's plan of care over the phone.  [name] states he will be attempting to drain pt's pleurex drain per home health instruction and that if he has any issues he will contact them for assistance.  Dressing changed on R side chest by PA Lucianne Lei since pt states that home health didn't change it the last time they visited her.  Received 1L IVF NS, tolerated well.  Reports feeling a bit better at end of tx.  Drank fluids as well during infusion.  Escorted via wc to exit with belongings and home oxygen tank.  Eric picking pt up.  Pt gave social security information to RN for billing purposes.  Passed along packet to [place] in Financial.

## 2018-11-13 NOTE — Telephone Encounter (Signed)
Oral Oncology Patient Advocate Encounter  I received the income information via email, the patient will sign the application at her appointment on 10/19.  Oral Oncology clinic will continue to follow.  [place] Patient Beverly Tucker Phone [phone] Fax [phone] 11/13/2018   1:16 PM

## 2018-11-13 NOTE — Progress Notes (Signed)
Received income information from RN.  After reviewing notes, it appeared [place] in oral chemo needed info. Scanned and emailed to [place] in pharmacy.  Information may also be used for one-time $1000 Advertising account executive. Patient will be approved based on this documentation. Will discuss further details on 11/18/18 and obtain signature if interested in receiving grant funds and my card will be given.

## 2018-11-13 NOTE — Patient Instructions (Signed)

## 2018-11-14 ENCOUNTER — Telehealth: Payer: Self-pay | Admitting: *Deleted

## 2018-11-14 ENCOUNTER — Telehealth: Payer: Self-pay | Admitting: Hematology

## 2018-11-14 NOTE — Telephone Encounter (Signed)
Son called, mother has scans rescheduled for 10/20, so they will need to reschedule her appts currently on 10/19 to after scans. He also asked if she should restart her Verzenio. Appointments for 10/20 cancelled.  Per Dr. [name], schedule patient for Thursday 10/22: Labs first, see MD at 9:40AM and have injection appt for [facility] following. Patient can restart Verzenio at this time.  Schedule message sent for those times.  Attempted to contact [name] - [phone]. LVMM with information as per Dr. [name]. Requested patient contact office to confirm.   He contacted office and confirmed that he received the message.about the appts.

## 2018-11-14 NOTE — Telephone Encounter (Signed)
Contacted by Alma Downs, RN w/ [facility] [phone] regarding type of dressing to be ordered for patient's breast wound and pleurx. They are currently using a dry dressing, but need orders.   Per Dr.Kale, use xeroform or dry dressing w/pressure tape.  Contacted [place] with Dr. Grier Mitts orders. She verbalized understanding.

## 2018-11-14 NOTE — Telephone Encounter (Signed)
Scheduled appt per 10/15 sch message - unable to reach pt's son - left message with appt date and time

## 2018-11-15 NOTE — Progress Notes (Signed)
[facility] Progress Note   ALMETIA NOP [id number] 1940-07-10 78 y.o.  Beverly Tucker is managed by Dr. 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  Metastatic breast cancer: The patient continues to be treated with Verzenio under the care of Dr. Sullivan Lone.  History of a recurrent left pleural effusion: The patient has a Pleurx catheter in her left chest wall.  Her son has been contacted and will attempt to drain her Pleurx catheter when she returns home today.  Anorexia: A referral will be made to nutrition.  Please see After Visit Summary for patient specific instructions.  Future Appointments  Date Time Provider [place]  11/19/2018  2:30 PM [facility]US 1 [facility]US [place]  11/19/2018  4:00 PM [facility]-MR 1 [facility]-MRI [place]  11/21/2018  9:15 AM [facility] LAB 6 [facility] None  11/21/2018  9:40 AM Brunetta Genera, MD [facility] None  11/21/2018 10:15 AM [facility] [place] FLUSH [facility] None  12/02/2018 11:00 AM [facility] [place] FLUSH [facility] None    No orders of the defined types were placed in this encounter.      Subjective:   Patient ID:  Beverly Tucker is a 78 y.o. (DOB [birth year]) female.  Chief Complaint:  Chief Complaint  Patient presents with   Fatigue    HPI [name]  Is a 78 y.o. female with a diagnosis of a metastatic malignant breast cancer.  The patient has mets to her spine.  She is treated with Verzenio under the direction of Dr. Sullivan Lone.  She has a left Pleurx catheter which was placed due to a recurrent left pleural effusion.  Home health has been coming to the home  and now her son is draining her Pleurx catheter.  There was no drainage on Monday.  She reports dyspnea on exertion.  She additionally has a cough with clear sputum.  She reports fatigue and has not been eating or drinking much lately.  She was recently discharged from the hospital.  She denies nausea, vomiting, diarrhea, fevers, or chills.  Medications: I have reviewed the patient's current medications.  Allergies: No Known Allergies  Past Medical History:  Diagnosis Date   Acute CHF (congestive heart failure) ([place]) 10/2018   Atrial fibrillation (HCC)    Cancer (HCC)    Breast    GERD (gastroesophageal reflux disease)    Hypertension     Past Surgical History:  Procedure Laterality Date   CHOLECYSTECTOMY     IR PERC PLEURAL DRAIN W/INDWELL CATH W/IMG GUIDE  11/08/2018   IR THORACENTESIS ASP PLEURAL SPACE W/IMG GUIDE  10/22/2018    Family History  Problem Relation Age of Onset   Diabetes Mellitus II Other        son    Social History   Socioeconomic History   Marital status: Widowed    Spouse name: Not on file   Number of children: Not on file   Years of education: Not on file   Highest education level: Not on file  Occupational History   Not on file  Social Needs   Financial resource strain: Not on file   Food insecurity    Worry:  Not on file    Inability: Not on file   Transportation needs    Medical: Not on file    Non-medical: Not on file  Tobacco Use   Smoking status: Never Smoker   Smokeless tobacco: Never Used  Substance and Sexual Activity   Alcohol use: Never    Frequency: Never   Drug use: Never   Sexual activity: Not on file  Lifestyle   Physical activity    Days per week: Not on file    Minutes per session: Not on file   Stress: Not on file  Relationships   Social connections    Talks on phone: Not on file    Gets together: Not on file    Attends religious service: Not on file    Active member of club or organization:  Not on file    Attends meetings of clubs or organizations: Not on file    Relationship status: Not on file   Intimate partner violence    Fear of current or ex partner: Not on file    Emotionally abused: Not on file    Physically abused: Not on file    Forced sexual activity: Not on file  Other Topics Concern   Not on file  Social History Narrative   Not on file    Past Medical History, Surgical history, Social history, and Family history were reviewed and updated as appropriate.   Please see review of systems for further details on the patient's review from today.   Review of Systems:  Review of Systems  Constitutional: Positive for appetite change and fatigue. Negative for chills, diaphoresis and fever.  HENT: Negative for trouble swallowing.   Respiratory: Positive for cough and shortness of breath. Negative for choking, chest tightness, wheezing and stridor.   Cardiovascular: Negative for chest pain and palpitations.  Gastrointestinal: Negative for constipation, diarrhea, nausea and vomiting.    Objective:   Physical Exam:  BP 104/69 (BP Location: Left Arm, Patient Position: Sitting)    Pulse 67    Temp 98 F (36.7 C) (Temporal)    Resp 18    Ht 5\' 6"  (1.676 m)    Wt 183 lb 8 oz (83.2 kg)    SpO2 91%    BMI 29.62 kg/m  ECOG: 1  Physical Exam Constitutional:      General: She is not in acute distress.    Appearance: She is not diaphoretic.  HENT:     Head: Normocephalic and atraumatic.  Cardiovascular:     Rate and Rhythm: Normal rate and regular rhythm.     Heart sounds: Normal heart sounds. No murmur. No friction rub. No gallop.   Pulmonary:     Effort: Pulmonary effort is normal. No respiratory distress.     Breath sounds: No stridor. No wheezing or rales.    Skin:    General: Skin is warm and dry.     Coloration: Skin is not pale.     Findings: No erythema.  Neurological:     Mental Status: She is alert.  Psychiatric:        Behavior: Behavior normal.         Thought Content: Thought content normal.        Judgment: Judgment normal.     Lab Review:     Component Value Date/Time   NA 141 11/17/2018 0031   K 3.9 11/17/2018 0031   CL 99 11/17/2018 0031   CO2 31 11/17/2018 0031   GLUCOSE  114 (H) 11/17/2018 0031   BUN 12 11/17/2018 0031   CREATININE 1.19 (H) 11/17/2018 0031   CREATININE 1.28 (H) 11/13/2018 0935   CALCIUM 8.8 (L) 11/17/2018 0031   PROT 6.9 11/16/2018 1840   ALBUMIN 3.0 (L) 11/16/2018 1840   AST 37 11/16/2018 1840   AST 18 11/13/2018 0935   ALT 25 11/16/2018 1840   ALT 16 11/13/2018 0935   ALKPHOS 89 11/16/2018 1840   BILITOT 0.7 11/16/2018 1840   BILITOT 0.3 11/13/2018 0935   GFRNONAA 43 (L) 11/17/2018 0031   GFRNONAA 40 (L) 11/13/2018 0935   GFRAA 50 (L) 11/17/2018 0031   GFRAA 46 (L) 11/13/2018 0935       Component Value Date/Time   WBC 11.9 (H) 11/17/2018 0031   RBC 3.41 (L) 11/17/2018 0031   HGB 9.9 (L) 11/17/2018 0031   HGB 10.1 (L) 11/13/2018 0935   HCT 32.3 (L) 11/17/2018 0031   PLT 409 (H) 11/17/2018 0031   PLT 317 11/13/2018 0935   MCV 94.7 11/17/2018 0031   MCH 29.0 11/17/2018 0031   MCHC 30.7 11/17/2018 0031   RDW 14.6 11/17/2018 0031   LYMPHSABS 1.0 11/13/2018 0935   MONOABS 0.9 11/13/2018 0935   EOSABS 0.2 11/13/2018 0935   BASOSABS 0.0 11/13/2018 0935   -------------------------------  Imaging from last 24 hours (if applicable):  Radiology interpretation: Dg Eye Foreign Body  Result Date: 10/21/2018 CLINICAL DATA:  Previous unspecified eye surgery; clearance prior to MRI EXAM: ORBITS FOR FOREIGN BODY - 2 VIEW COMPARISON:  None. FINDINGS: There is no evidence of metallic foreign body within the orbits. No significant bone abnormality identified. IMPRESSION: No evidence of metallic foreign body within the orbits. Electronically Signed   By: Logan Bores M.D.   On: 10/21/2018 16:34   Dg Chest 1 View  Result Date: 11/06/2018 CLINICAL DATA:  Post left-sided thoracentesis. EXAM: CHEST   1 VIEW COMPARISON:  Radiographs 11/05/2018 and 10/22/2018. FINDINGS: 1344 hours. The left pleural effusion has decreased in volume. There is improved aeration of the left lung base. There is a stable small right pleural effusion and mild right basilar atelectasis. The heart size and mediastinal contours are stable with aortic atherosclerosis. No pneumothorax. IMPRESSION: 1. Decreased left pleural effusion and improved aeration of the left lung base status post thoracentesis. No pneumothorax. 2. Stable small right pleural effusion and right basilar atelectasis. Electronically Signed   By: Richardean Sale M.D.   On: 11/06/2018 14:16   Dg Chest 1 View  Result Date: 10/22/2018 CLINICAL DATA:  Status post left thoracentesis. EXAM: CHEST  1 VIEW COMPARISON:  Radiograph of same day. FINDINGS: Stable cardiomegaly. No pneumothorax is noted. Left pleural effusion is significantly smaller status post thoracentesis. Mild right basilar atelectasis is noted with probable small right pleural effusion. Bony thorax is unremarkable. IMPRESSION: Left pleural effusion is significantly smaller status post thoracentesis. No pneumothorax is noted. Electronically Signed   By: Marijo Conception M.D.   On: 10/22/2018 12:57   Dg Chest 2 View  Result Date: 11/08/2018 CLINICAL DATA:  Left pleural effusion. Insertion of PleurX catheter this morning. Coughing after the procedure. EXAM: CHEST - 2 VIEW COMPARISON:  Radiographs dated 11/07/2018 and 11/06/2018 FINDINGS: There is a tiny left apical pneumothorax. PleurX catheter in place. Minimal residual pleural effusion. There is a small right pleural effusion. Chronic cardiomegaly. Pulmonary vascularity is within normal limits. No acute bone abnormality. Chronic accentuation of the thoracic kyphosis. IMPRESSION: 1. Tiny left apical pneumothorax. PleurX catheter in place. 2.  Small right pleural effusion.  Minimal residual left effusion. Electronically Signed   By: Lorriane Shire M.D.   On:  11/08/2018 12:23   Dg Chest 2 View  Result Date: 11/07/2018 CLINICAL DATA:  Left thoracentesis yesterday EXAM: CHEST - 2 VIEW COMPARISON:  11/06/2018 FINDINGS: Moderate left pleural effusion appears slightly larger. No pneumothorax. Consolidation left lower lobe shows progression Small right effusion and mild right lower lobe airspace disease unchanged. Negative for heart failure or edema. IMPRESSION: Progression of left effusion and left lower lobe consolidation following left thoracentesis. No pneumothorax. Electronically Signed   By: Franchot Gallo M.D.   On: 11/07/2018 09:53   Dg Chest 2 View  Result Date: 11/05/2018 CLINICAL DATA:  Shortness of breath EXAM: CHEST - 2 VIEW COMPARISON:  October 22, 2018 FINDINGS: There is mild cardiomegaly. A moderate to large left pleural effusion is seen. There is trace right pleural effusion. Mildly increased interstitial markings seen predominantly at the right lung base, likely chronic lung changes. Air not calcifications. IMPRESSION: Moderate to large left and trace right pleural effusion. Electronically Signed   By: Prudencio Pair M.D.   On: 11/05/2018 22:09   Mr Jeri Cos [id number] Contrast  Result Date: 10/21/2018 CLINICAL DATA:  Breast cancer staging. EXAM: MRI HEAD WITHOUT AND WITH CONTRAST TECHNIQUE: Multiplanar, multiecho pulse sequences of the brain and surrounding structures were obtained without and with intravenous contrast. CONTRAST:  52mL GADAVIST GADOBUTROL 1 MMOL/ML IV SOLN COMPARISON:  None. FINDINGS: Brain: Mild cerebral atrophy. Negative for hydrocephalus. Negative for acute or chronic infarct. Negative for hemorrhage, mass, or edema. Normal enhancement postcontrast infusion.  No enhancing mass lesion. Vascular: Normal arterial flow voids Skull and upper cervical spine: Negative Sinuses/Orbits: Negative Other: None IMPRESSION: Negative MRI brain with contrast.  Negative for metastatic disease. Electronically Signed   By: Franchot Gallo M.D.   On:  10/21/2018 17:47   Ct Abdomen Pelvis W Contrast  Result Date: 10/22/2018 CLINICAL DATA:  History of breast cancer, breast cancer staging with suspicion for metastatic disease. EXAM: CT ABDOMEN AND PELVIS WITH CONTRAST TECHNIQUE: Multidetector CT imaging of the abdomen and pelvis was performed using the standard protocol following bolus administration of intravenous contrast. CONTRAST:  171mL OMNIPAQUE IOHEXOL 300 MG/ML  SOLN COMPARISON:  None. FINDINGS: Lower chest: Opacification of the left inferior chest due to large left-sided pleural effusion with basilar collapse, nodularity in the costodiaphragmatic sulcus best seen on sagittal images the left chest. Variable enhancement of collapsed lung tissue is incompletely imaged. Basilar atelectasis noted on the right with nodular opacity at the right lung base. Heart is incompletely imaged, no visualized pericardial fluid or nodularity. Hepatobiliary: Subtle low-attenuation in the medial section of the left hepatic lobe (hepatic subsegment IV B) best seen on image 20, series 3 measuring approximately 1.5 x 1.4 cm. Post cholecystectomy without signs of biliary ductal dilation. Pancreas: Unremarkable. No pancreatic ductal dilatation or surrounding inflammatory changes. Spleen: No signs of focal splenic lesion. Adrenals/Urinary Tract: Adrenal glands are normal. No signs of hydronephrosis. Symmetric renal enhancement. Cyst arises from the lower pole the left kidney. Stomach/Bowel: Stomach is normal. Small bowel with normal caliber, no signs of acute small bowel process. A normal appendix. Moderate size periumbilical ventral hernia contains the midportion of the transverse colon, no signs of obstruction at this time. Moderate sigmoid diverticulosis with mild fascial thickening along the posterior aspect of sigmoid colon in the sigmoid mesocolon. Vascular/Lymphatic: Moderate to marked calcific atherosclerosis of the abdominal aorta extending into the iliac vessels. No  signs of aneurysm in the abdomen. No retroperitoneal adenopathy. Scattered lymph nodes are present but without pathologic size. No signs of pelvic lymphadenopathy. Reproductive: Thickening of the endometrium beyond what is expected for this 78 year old patient measuring nearly 2 cm as estimated by CT. Other: Urinary bladder is decompressed. Hernia as discussed above. No signs of acute process in the body wall with areas of gas in the subcutaneous fat likely related to prior injection. Bowing of levator ani musculature with descent of anterior middle and posterior compartment (bladder base, uterus-vagina and rectum) visceral contents. (Findings are indicative of pelvic floor dysfunction and should be correlated clinically. Musculoskeletal: Mixed lytic and sclerotic process in the right ischium measures 1.8 x 1.6 cm (image 74, series 3). Mixed lytic and sclerotic changes in the right eighth and ninth ribs with subacute fractures at the lateral margin of these ribs. Cortical destruction the medial cortex of the right tenth rib. Question of increased enhancement within the central canal along the distal aspect of the spinal cord and filum terminalis. Spinal degenerative changes. Small sclerotic focus at the L1 level best seen on sagittal images. Grade 1 anterolisthesis of L5 on S1 with pars defects at this level the left. Vertebral hemangioma at L3. IMPRESSION: 1. Large malignant left-sided effusion. Trace effusion on the right with basilar atelectasis. 2. Variation in parenchymal enhancement raising the question of hilar lesion in the left chest, consider CT chest if not performed for further evaluation. 3. 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 4. Endometrial thickening may represent a second primary, consider endovaginal sonogram for further assessment as warranted. 5. Question of added enhancement in  these central canal along the spinal cord internal nerve roots. This could be seen in the setting of CNS involvement but is not certain. MRI of the spine for additional staging could be performed as warranted. 6. Area of vague hypoattenuation anterior and inferior to portal structures in medial section left hepatic lobe likely an area of fatty infiltration, attention on follow-up or assess with MR for definitive characterization. Electronically Signed   By: Zetta Bills M.D.   On: 10/22/2018 08:26   Dg Chest Port 1 View  Result Date: 11/17/2018 CLINICAL DATA:  Shortness of breath. EXAM: PORTABLE CHEST 1 VIEW COMPARISON:  Radiograph yesterday. CT 10/16/2018 FINDINGS: Left PleurX catheter remains in place. Low lung volumes with bibasilar volume loss. Hazy bibasilar opacities consistent with pleural effusions adjacent atelectasis/consolidation. Cardiomegaly, not well assessed due to adjacent airspace disease. Worsening vascular congestion. No visualized pneumothorax. IMPRESSION: 1. Worsening vascular congestion. 2. Bibasilar opacities consistent with pleural effusions and adjacent atelectasis/consolidation. 3. Left PleurX catheter remains in place. No visualized pneumothorax. 4. Cardiomegaly. Electronically Signed   By: Keith Rake M.D.   On: 11/17/2018 02:19   Dg Chest Port 1 View  Result Date: 11/16/2018 CLINICAL DATA:  Shortness of breath EXAM: PORTABLE CHEST 1 VIEW COMPARISON:  November 09, 2018 FINDINGS: There is mild cardiomegaly. Again noted is a left-sided pleural catheter. Aortic knob calcifications. There is a small left pleural effusion with patchy airspace opacity at the left lung base which is new. There is a trace right pleural effusion with hazy airspace opacity at the right lung base. IMPRESSION: Small bilateral pleural effusions, left greater than right with new hazy airspace opacities at both lung bases which could be due to infectious etiology, layering effusion, and/or edema.  Cardiomegaly Left-sided pleural catheter. Electronically Signed  By: Prudencio Pair M.D.   On: 11/16/2018 19:15   Dg Chest Port 1 View  Result Date: 11/09/2018 CLINICAL DATA:  Short of breath and weakness. EXAM: PORTABLE CHEST 1 VIEW COMPARISON:  1 day prior FINDINGS: Left-sided PleurX catheter remains in place. The Chin overlies the apices. Moderate cardiomegaly. Increase in small left pleural effusion. Given limitations of chin overlying the left apex, no residual pneumothorax is identified. Similar right and worsened left base airspace disease. Suspect developing mild interstitial edema. IMPRESSION: Worsened aeration, with probable developing interstitial edema and progressive bibasilar airspace disease. Left pleural drain in place, without convincing evidence of residual pneumothorax. Electronically Signed   By: Abigail Miyamoto M.D.   On: 11/09/2018 10:53   Dg Chest Port 1 View  Result Date: 10/22/2018 CLINICAL DATA:  Pleural effusion. EXAM: PORTABLE CHEST 1 VIEW COMPARISON:  10/16/2018. FINDINGS: Stable cardiomegaly. No pulmonary venous congestion. Prominent left-sided pleural effusion, progressed from prior exam. Underlying left lower lung atelectasis/infiltrate most likely present. Atelectatic changes right lung base also again noted. IMPRESSION: 1.  Stable cardiomegaly.  No pulmonary venous congestion. 2. Prominent left-sided pleural effusion, progressed from prior exam. Underlying left lower lobe atelectasis/infiltrate most likely present. Atelectatic changes right lung base also again noted. Electronically Signed   By: Marcello Moores  Register   On: 10/22/2018 10:03   Ir Thoracentesis Asp Pleural Space W/img Guide  Result Date: 10/22/2018 INDICATION: Patient with history of CHF, chronic kidney disease, metastatic breast cancer, dyspnea, recurrent left pleural effusion. Request made for diagnostic and therapeutic left thoracentesis. EXAM: ULTRASOUND GUIDED DIAGNOSTIC AND THERAPEUTIC LEFT THORACENTESIS  MEDICATIONS: None COMPLICATIONS: None immediate. PROCEDURE: An ultrasound guided thoracentesis was thoroughly discussed with the patient and questions answered. The benefits, risks, alternatives and complications were also discussed. The patient understands and wishes to proceed with the procedure. Written consent was obtained. Ultrasound was performed to localize and mark an adequate pocket of fluid in the left chest. The area was then prepped and draped in the normal sterile fashion. 1% Lidocaine was used for local anesthesia. Under ultrasound guidance a 6 Fr Safe-T-Centesis catheter was introduced. Thoracentesis was performed. The catheter was removed and a dressing applied. FINDINGS: A total of approximately 820 cc of hazy, amber fluid was removed. Samples were sent to the laboratory as requested by the clinical team. IMPRESSION: Successful ultrasound guided diagnostic and therapeutic left thoracentesis yielding 820 cc of pleural fluid. Read by: Rowe Robert, PA-C Electronically Signed   By: Sandi Mariscal M.D.   On: 10/22/2018 12:25   Ir Perc Pleural Drain W/indwell Cath W/img Guide  Result Date: 11/08/2018 INDICATION: 78 year old female with stage IV breast cancer and a recurrent symptomatic left-sided malignant pleural effusion. She presents for placement of a tunneled pleural drainage catheter. EXAM: Placement of a tunneled pleural drainage catheter MEDICATIONS: 2 g Ancef ANESTHESIA/SEDATION: Fentanyl 50 mcg IV; Versed 1.5 mg IV Moderate Sedation Time:  20 minutes The patient was continuously monitored during the procedure by the interventional radiology nurse under my direct supervision. COMPLICATIONS: None immediate. PROCEDURE: Informed written consent was obtained from the patient after a thorough discussion of the procedural risks, benefits and alternatives. All questions were addressed. Maximal Sterile Barrier Technique was utilized including caps, mask, sterile gowns, sterile gloves, sterile drape,  hand hygiene and skin antiseptic. A timeout was performed prior to the initiation of the procedure. Ultrasound was used to interrogate the left chest. A large pleural effusion is present. A suitable skin entry site was selected and marked. Local anesthesia was attained  by infiltration with 1% lidocaine. A small dermatotomy was made. An 18 gauge sheath needle was then advanced over a rib and into the pleural space. The sheath was left in place while the 18 gauge needle was removed. A short Amplatz wire was advanced through the sheath needle and positioned in the pleural space. A suitable skin exit site 5 cm anterior and medial to the pleural entry site was identified. Local anesthesia was again attained by infiltration with 1% lidocaine. A second small dermatotomy was made. The pleural drainage catheter was then tunneled from the exit site to the dermatotomy overlying the pleural entry site. The sheath needle was then removed over the wire. A peel-away sheath was advanced over the wire and into the pleural space. The tunneled drainage catheter was then advanced through the peel-away sheath and the peel-away sheath was discarded. The catheter was connected to a vacuum bottle and approximately 700 mL of pleural fluid was evacuated. Further aspiration was stopped due to symptomatic coughing. The catheter was secured to the skin with 0 Prolene suture. The dermatotomy overlying the pleural entry site was closed with Dermabond. Sterile bandages were placed. IMPRESSION: Technically successful placement of a left tunneled pleural drainage catheter. Signed, Criselda Peaches, MD, [facility] Radiology Electronically Signed   By: Jacqulynn Cadet M.D.   On: 11/08/2018 10:05   US Thoracentesis Asp Pleural Space W/img Guide  Result Date: 11/06/2018 INDICATION: Patient with history of CHF, chronic kidney disease, dyspnea, metastatic breast cancer, recurrent left pleural  effusion. Request made for therapeutic left thoracentesis. EXAM: ULTRASOUND GUIDED THERAPEUTIC LEFT THORACENTESIS MEDICATIONS: None COMPLICATIONS: None immediate. PROCEDURE: An ultrasound guided thoracentesis was thoroughly discussed with the patient and questions answered. The benefits, risks, alternatives and complications were also discussed. The patient understands and wishes to proceed with the procedure. Written consent was obtained. Ultrasound was performed to localize and mark an adequate pocket of fluid in the left chest. The area was then prepped and draped in the normal sterile fashion. 1% Lidocaine was used for local anesthesia. Under ultrasound guidance a 6 Fr Safe-T-Centesis catheter was introduced. Thoracentesis was performed. The catheter was removed and a dressing applied. FINDINGS: A total of approximately 900 cc of amber/blood-tinged fluid was removed. Due to persistent patient coughing only the above amount of fluid was removed today. IMPRESSION: Successful ultrasound guided therapeutic left thoracentesis yielding 900 cc of pleural fluid. Read by: Rowe Robert, PA-C Electronically Signed   By: Jacqulynn Cadet M.D.   On: 11/06/2018 14:11

## 2018-11-16 ENCOUNTER — Other Ambulatory Visit: Payer: Self-pay

## 2018-11-16 ENCOUNTER — Encounter (HOSPITAL_COMMUNITY): Payer: Self-pay | Admitting: *Deleted

## 2018-11-16 ENCOUNTER — Inpatient Hospital Stay (HOSPITAL_COMMUNITY)
Admission: EM | Admit: 2018-11-16 | Discharge: 2018-11-24 | DRG: 291 | Disposition: A | Discharge status: Home Health | Payer: Medicare HMO | Attending: Internal Medicine | Admitting: Internal Medicine

## 2018-11-16 ENCOUNTER — Emergency Department (HOSPITAL_COMMUNITY): Payer: Medicare HMO

## 2018-11-16 DIAGNOSIS — N183 Chronic kidney disease, stage 3 unspecified: Secondary | ICD-10-CM | POA: Diagnosis present

## 2018-11-16 DIAGNOSIS — K219 Gastro-esophageal reflux disease without esophagitis: Secondary | ICD-10-CM | POA: Diagnosis present

## 2018-11-16 DIAGNOSIS — J910 Malignant pleural effusion: Secondary | ICD-10-CM | POA: Diagnosis present

## 2018-11-16 DIAGNOSIS — E876 Hypokalemia: Secondary | ICD-10-CM | POA: Diagnosis not present

## 2018-11-16 DIAGNOSIS — R0902 Hypoxemia: Secondary | ICD-10-CM

## 2018-11-16 DIAGNOSIS — I361 Nonrheumatic tricuspid (valve) insufficiency: Secondary | ICD-10-CM | POA: Diagnosis not present

## 2018-11-16 DIAGNOSIS — R059 Cough, unspecified: Secondary | ICD-10-CM

## 2018-11-16 DIAGNOSIS — R7989 Other specified abnormal findings of blood chemistry: Secondary | ICD-10-CM | POA: Diagnosis present

## 2018-11-16 DIAGNOSIS — D72829 Elevated white blood cell count, unspecified: Secondary | ICD-10-CM | POA: Diagnosis present

## 2018-11-16 DIAGNOSIS — R778 Other specified abnormalities of plasma proteins: Secondary | ICD-10-CM | POA: Diagnosis present

## 2018-11-16 DIAGNOSIS — R112 Nausea with vomiting, unspecified: Secondary | ICD-10-CM | POA: Diagnosis present

## 2018-11-16 DIAGNOSIS — I480 Paroxysmal atrial fibrillation: Secondary | ICD-10-CM | POA: Diagnosis present

## 2018-11-16 DIAGNOSIS — C50919 Malignant neoplasm of unspecified site of unspecified female breast: Secondary | ICD-10-CM | POA: Diagnosis present

## 2018-11-16 DIAGNOSIS — I471 Supraventricular tachycardia, unspecified: Secondary | ICD-10-CM | POA: Diagnosis present

## 2018-11-16 DIAGNOSIS — I5032 Chronic diastolic (congestive) heart failure: Secondary | ICD-10-CM | POA: Diagnosis present

## 2018-11-16 DIAGNOSIS — J90 Pleural effusion, not elsewhere classified: Secondary | ICD-10-CM | POA: Diagnosis not present

## 2018-11-16 DIAGNOSIS — C7951 Secondary malignant neoplasm of bone: Secondary | ICD-10-CM | POA: Diagnosis present

## 2018-11-16 DIAGNOSIS — R197 Diarrhea, unspecified: Secondary | ICD-10-CM | POA: Diagnosis present

## 2018-11-16 DIAGNOSIS — I10 Essential (primary) hypertension: Secondary | ICD-10-CM | POA: Diagnosis present

## 2018-11-16 DIAGNOSIS — Z9049 Acquired absence of other specified parts of digestive tract: Secondary | ICD-10-CM

## 2018-11-16 DIAGNOSIS — C50911 Malignant neoplasm of unspecified site of right female breast: Secondary | ICD-10-CM | POA: Diagnosis present

## 2018-11-16 DIAGNOSIS — R05 Cough: Secondary | ICD-10-CM

## 2018-11-16 DIAGNOSIS — D649 Anemia, unspecified: Secondary | ICD-10-CM | POA: Diagnosis present

## 2018-11-16 DIAGNOSIS — Z87440 Personal history of urinary (tract) infections: Secondary | ICD-10-CM | POA: Diagnosis not present

## 2018-11-16 DIAGNOSIS — I482 Chronic atrial fibrillation, unspecified: Secondary | ICD-10-CM | POA: Diagnosis present

## 2018-11-16 DIAGNOSIS — Z20828 Contact with and (suspected) exposure to other viral communicable diseases: Secondary | ICD-10-CM | POA: Diagnosis present

## 2018-11-16 DIAGNOSIS — Z833 Family history of diabetes mellitus: Secondary | ICD-10-CM

## 2018-11-16 DIAGNOSIS — I248 Other forms of acute ischemic heart disease: Secondary | ICD-10-CM | POA: Diagnosis present

## 2018-11-16 DIAGNOSIS — J9601 Acute respiratory failure with hypoxia: Secondary | ICD-10-CM | POA: Diagnosis present

## 2018-11-16 DIAGNOSIS — E440 Moderate protein-calorie malnutrition: Secondary | ICD-10-CM | POA: Diagnosis present

## 2018-11-16 DIAGNOSIS — I5033 Acute on chronic diastolic (congestive) heart failure: Secondary | ICD-10-CM | POA: Diagnosis present

## 2018-11-16 DIAGNOSIS — I130 Hypertensive heart and chronic kidney disease with heart failure and stage 1 through stage 4 chronic kidney disease, or unspecified chronic kidney disease: Principal | ICD-10-CM | POA: Diagnosis present

## 2018-11-16 DIAGNOSIS — N1831 Chronic kidney disease, stage 3a: Secondary | ICD-10-CM | POA: Diagnosis present

## 2018-11-16 DIAGNOSIS — F419 Anxiety disorder, unspecified: Secondary | ICD-10-CM | POA: Diagnosis present

## 2018-11-16 DIAGNOSIS — Z79811 Long term (current) use of aromatase inhibitors: Secondary | ICD-10-CM

## 2018-11-16 DIAGNOSIS — N179 Acute kidney failure, unspecified: Secondary | ICD-10-CM | POA: Diagnosis present

## 2018-11-16 DIAGNOSIS — I371 Nonrheumatic pulmonary valve insufficiency: Secondary | ICD-10-CM | POA: Diagnosis not present

## 2018-11-16 DIAGNOSIS — Z79899 Other long term (current) drug therapy: Secondary | ICD-10-CM

## 2018-11-16 DIAGNOSIS — Z7901 Long term (current) use of anticoagulants: Secondary | ICD-10-CM | POA: Diagnosis not present

## 2018-11-16 DIAGNOSIS — R0602 Shortness of breath: Secondary | ICD-10-CM

## 2018-11-16 LAB — COMPREHENSIVE METABOLIC PANEL
ALT: 25 U/L (ref 0–44)
AST: 37 U/L (ref 15–41)
Albumin: 3 g/dL — ABNORMAL LOW (ref 3.5–5.0)
Alkaline Phosphatase: 89 U/L (ref 38–126)
Anion gap: 13 (ref 5–15)
BUN: 15 mg/dL (ref 8–23)
CO2: 27 mmol/L (ref 22–32)
Calcium: 9.1 mg/dL (ref 8.9–10.3)
Chloride: 98 mmol/L (ref 98–111)
Creatinine, Ser: 1.29 mg/dL — ABNORMAL HIGH (ref 0.44–1.00)
GFR calc Af Amer: 46 mL/min — ABNORMAL LOW (ref >60)
GFR calc non Af Amer: 39 mL/min — ABNORMAL LOW (ref >60)
Glucose, Bld: 145 mg/dL — ABNORMAL HIGH (ref 70–99)
Potassium: 4.7 mmol/L (ref 3.5–5.1)
Sodium: 138 mmol/L (ref 135–145)
Total Bilirubin: 0.7 mg/dL (ref 0.3–1.2)
Total Protein: 6.9 g/dL (ref 6.5–8.1)

## 2018-11-16 LAB — BRAIN NATRIURETIC PEPTIDE: B Natriuretic Peptide: 183.7 pg/mL — ABNORMAL HIGH (ref 0.0–100.0)

## 2018-11-16 LAB — CBC
HCT: 37.5 % (ref 36.0–46.0)
Hemoglobin: 11.3 g/dL — ABNORMAL LOW (ref 12.0–15.0)
MCH: 28.8 pg (ref 26.0–34.0)
MCHC: 30.1 g/dL (ref 30.0–36.0)
MCV: 95.7 fL (ref 80.0–100.0)
Platelets: 462 10*3/uL — ABNORMAL HIGH (ref 150–400)
RBC: 3.92 MIL/uL (ref 3.87–5.11)
RDW: 14.7 % (ref 11.5–15.5)
WBC: 13.7 10*3/uL — ABNORMAL HIGH (ref 4.0–10.5)
nRBC: 0 % (ref 0.0–0.2)

## 2018-11-16 LAB — SARS CORONAVIRUS 2 BY RT PCR (HOSPITAL ORDER, PERFORMED IN ~~LOC~~ HOSPITAL LAB): SARS Coronavirus 2: NEGATIVE

## 2018-11-16 LAB — TSH: TSH: 2.545 u[IU]/mL (ref 0.350–4.500)

## 2018-11-16 LAB — LACTIC ACID, PLASMA: Lactic Acid, Venous: 1.8 mmol/L (ref 0.5–1.9)

## 2018-11-16 LAB — MAGNESIUM: Magnesium: 2.3 mg/dL (ref 1.7–2.4)

## 2018-11-16 LAB — TROPONIN I (HIGH SENSITIVITY): Troponin I (High Sensitivity): 32 ng/L — ABNORMAL HIGH (ref <18)

## 2018-11-16 MED ORDER — ADENOSINE 6 MG/2ML IV SOLN
6.0000 mg | Freq: Once | INTRAVENOUS | Status: AC
  Administered 2018-11-16: 19:00:00 6 mg via INTRAVENOUS
  Filled 2018-11-16: qty 2

## 2018-11-16 MED ORDER — FUROSEMIDE 10 MG/ML IJ SOLN
40.0000 mg | Freq: Two times a day (BID) | INTRAMUSCULAR | Status: DC
  Administered 2018-11-17 (×2): 40 mg via INTRAVENOUS
  Filled 2018-11-16 (×2): qty 4

## 2018-11-16 MED ORDER — SODIUM CHLORIDE 0.9% FLUSH
3.0000 mL | Freq: Once | INTRAVENOUS | Status: AC
  Administered 2018-11-16: 20:00:00 3 mL via INTRAVENOUS

## 2018-11-16 MED ORDER — FUROSEMIDE 10 MG/ML IJ SOLN
40.0000 mg | Freq: Once | INTRAMUSCULAR | Status: AC
  Administered 2018-11-16: 40 mg via INTRAVENOUS
  Filled 2018-11-16: qty 4

## 2018-11-16 MED ORDER — METOPROLOL TARTRATE 25 MG PO TABS
25.0000 mg | ORAL_TABLET | Freq: Two times a day (BID) | ORAL | Status: DC
  Administered 2018-11-16 – 2018-11-17 (×3): 25 mg via ORAL
  Filled 2018-11-16 (×3): qty 1

## 2018-11-16 NOTE — ED Notes (Signed)
MD attempting vagal maneuver.

## 2018-11-16 NOTE — ED Provider Notes (Signed)
[facility] EMERGENCY DEPARTMENT Provider Note   CSN: [id number] Arrival date & time: 11/16/18  1824     History   Chief Complaint Chief Complaint  Patient presents with   SVT   Shortness of Breath    HPI Beverly Tucker is a 78 y.o. female.     The history is provided by the patient, the EMS personnel and medical records. No language interpreter was used.  Shortness of Breath Severity:  Severe Duration:  3 days Timing:  Constant Progression:  Worsening Chronicity:  Recurrent Relieved by:  Nothing Worsened by:  Nothing Ineffective treatments:  None tried Associated symptoms: sputum production   Associated symptoms: no abdominal pain, no chest pain, no cough, no diaphoresis, no fever, no headaches, no neck pain, no vomiting and no wheezing     Past Medical History:  Diagnosis Date   Acute CHF (congestive heart failure) ([place]) 10/2018   Atrial fibrillation (HCC)    Cancer (HCC)    Breast    GERD (gastroesophageal reflux disease)    Hypertension     Patient Active Problem List   Diagnosis Date Noted   Malignant pleural effusion    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 11/06/2018   AKI (acute kidney injury) ([place]) 11/06/2018   AF (paroxysmal atrial fibrillation) ([place]) 11/06/2018   Pleural effusion 11/06/2018   Bone metastases ([place]) 11/04/2018   Metastatic breast cancer (HCC)    Pneumothorax    Gram-positive bacteremia    Mass of breast, right 10/15/2018   Acute CHF (congestive heart failure) ([place]) 10/15/2018   Shortness of breath 10/14/2018   CKD (chronic kidney disease) stage 3, GFR 30-59 ml/min 02/06/2018   HTN (hypertension) 12/01/2015    Past Surgical History:  Procedure Laterality Date   CHOLECYSTECTOMY     IR PERC PLEURAL DRAIN W/INDWELL CATH W/IMG GUIDE  11/08/2018   IR THORACENTESIS ASP PLEURAL SPACE W/IMG GUIDE  10/22/2018     OB History   No obstetric history on file.      Home Medications    Prior  to Admission medications   Medication Sig Start Date End Date Taking? Authorizing Provider  abemaciclib (VERZENIO) 50 MG tablet Take 1 tablet (50 mg total) by mouth 2 (two) times daily. Swallow tablets whole. Do not chew, crush, or split tablets before swallowing. 11/04/18   Brunetta Genera, MD  ALPRAZolam Duanne Moron) 0.5 MG tablet Take 0.25-0.5 mg by mouth 2 (two) times daily as needed for anxiety. 05/31/17   [provider]  apixaban (ELIQUIS) 5 MG TABS tablet Take 1 tablet (5 mg total) by mouth 2 (two) times daily. 10/23/18   Jeanmarie Hubert, MD  ergocalciferol (VITAMIN D2) 1.25 MG (50000 UT) capsule Take 1 capsule (50,000 Units total) by mouth once a week. 11/04/18   Brunetta Genera, MD  letrozole [facility]) 2.5 MG tablet Take 1 tablet (2.5 mg total) by mouth daily. 11/04/18   Brunetta Genera, MD  metoprolol tartrate (LOPRESSOR) 25 MG tablet Take 1 tablet (25 mg total) by mouth 2 (two) times daily. 10/23/18   Jeanmarie Hubert, MD  oxyCODONE-acetaminophen (PERCOCET) 7.5-325 MG tablet Take 1 tablet by mouth every 4 (four) hours as needed for up to 7 days for moderate pain. 11/10/18 11/17/18  Nita Sells, MD    Family History No family history on file.  Social History Social History   Tobacco Use   Smoking status: Never Smoker   Smokeless tobacco: Never Used  Substance Use Topics   Alcohol use:  Never    Frequency: Never   Drug use: Never     Allergies   Patient has no known allergies.   Review of Systems Review of Systems  Constitutional: Positive for fatigue. Negative for chills, diaphoresis and fever.  HENT: Negative for congestion.   Respiratory: Positive for sputum production and shortness of breath. Negative for cough, chest tightness, wheezing and stridor.   Cardiovascular: Positive for palpitations. Negative for chest pain and leg swelling.  Gastrointestinal: Negative for abdominal pain, constipation, diarrhea, nausea and vomiting.    Genitourinary: Negative for dysuria, flank pain and frequency.  Musculoskeletal: Negative for back pain and neck pain.  Skin: Positive for wound.  Neurological: Negative for light-headedness and headaches.  Psychiatric/Behavioral: Negative for agitation and confusion.  All other systems reviewed and are negative.    Physical Exam Updated Vital Signs BP (!) 141/64    Pulse 91    Temp 97.8 F (36.6 C) (Oral)    Resp (!) 27    Ht 5\' 6"  (1.676 m)    Wt 82.1 kg    SpO2 100%    BMI 29.21 kg/m   Physical Exam Vitals signs and nursing note reviewed.  Constitutional:      General: She is in acute distress.     Appearance: She is well-developed. She is diaphoretic. She is not ill-appearing or toxic-appearing.  HENT:     Head: Normocephalic.     Mouth/Throat:     Mouth: Mucous membranes are moist.  Eyes:     Pupils: Pupils are equal, round, and reactive to light.  Neck:     Musculoskeletal: Normal range of motion.  Cardiovascular:     Rate and Rhythm: Regular rhythm. Tachycardia present.  Pulmonary:     Effort: Tachypnea present. No bradypnea.     Breath sounds: Rhonchi and rales present. No wheezing.  Chest:     Chest wall: No tenderness.  Musculoskeletal:     Right lower leg: She exhibits no tenderness. Edema present.     Left lower leg: She exhibits no tenderness. Edema present.  Skin:    Capillary Refill: Capillary refill takes less than 2 seconds.     Findings: No erythema.  Neurological:     General: No focal deficit present.     Mental Status: She is alert.  Psychiatric:        Mood and Affect: Mood is anxious.      ED Treatments / Results  Labs (all labs ordered are listed, but only abnormal results are displayed) Labs Reviewed  CBC - Abnormal; Notable for the following components:      Result Value   WBC 13.7 (*)    Hemoglobin 11.3 (*)    Platelets 462 (*)    All other components within normal limits  COMPREHENSIVE METABOLIC PANEL - Abnormal; Notable for  the following components:   Glucose, Bld 145 (*)    Creatinine, Ser 1.29 (*)    Albumin 3.0 (*)    GFR calc non Af Amer 39 (*)    GFR calc Af Amer 46 (*)    All other components within normal limits  BRAIN NATRIURETIC PEPTIDE - Abnormal; Notable for the following components:   B Natriuretic Peptide 183.7 (*)    All other components within normal limits  TROPONIN I (HIGH SENSITIVITY) - Abnormal; Notable for the following components:   Troponin I (High Sensitivity) 32 (*)    All other components within normal limits  SARS CORONAVIRUS 2 BY RT PCR [facility]  ORDER, PERFORMED IN [facility])  URINE CULTURE  MAGNESIUM  LACTIC ACID, PLASMA  TSH  LACTIC ACID, PLASMA  RAPID URINE DRUG SCREEN, HOSP PERFORMED  URINALYSIS, ROUTINE W REFLEX MICROSCOPIC  TROPONIN I (HIGH SENSITIVITY)    EKG EKG Interpretation  Date/Time:  Saturday November 16 2018 18:49:14 EDT Ventricular Rate:  116 PR Interval:    QRS Duration: 101 QT Interval:  320 QTC Calculation: 445 R Axis:   13 Text Interpretation:  Sinus tachycardia Ventricular premature complex Low voltage, precordial leads Borderline repolarization abnormality Minimal ST elevation, inferior leads Baseline wander in lead(s) V6 When compared to prior, now sinus tachycardia from SVT>  No STEMI Confirmed by Antony Blackbird [phone]) on 11/16/2018 7:47:30 PM   Radiology Dg Chest Port 1 View  Result Date: 11/16/2018 CLINICAL DATA:  Shortness of breath EXAM: PORTABLE CHEST 1 VIEW COMPARISON:  November 09, 2018 FINDINGS: There is mild cardiomegaly. Again noted is a left-sided pleural catheter. Aortic knob calcifications. There is a small left pleural effusion with patchy airspace opacity at the left lung base which is new. There is a trace right pleural effusion with hazy airspace opacity at the right lung base. IMPRESSION: Small bilateral pleural effusions, left greater than right with new hazy airspace opacities at both lung bases which could be  due to infectious etiology, layering effusion, and/or edema. Cardiomegaly Left-sided pleural catheter. Electronically Signed   By: Prudencio Pair M.D.   On: 11/16/2018 19:15    Procedures .Cardioversion  Date/Time: 11/16/2018 8:16 PM Performed by: Courtney Paris, MD Authorized by: Courtney Paris, MD   Consent:    Consent obtained:  Emergent situation and verbal   Consent given by:  Patient   Risks discussed:  Pain, induced arrhythmia and death   Alternatives discussed:  No treatment Pre-procedure details:    Cardioversion basis:  Emergent   Rhythm:  Supraventricular tachycardia Patient sedated: No Attempt one:    Cardioversion mode attempt one: adenosine.   Shock outcome:  Conversion to normal sinus rhythm Post-procedure details:    Patient status:  Awake   Patient tolerance of procedure:  Tolerated well, no immediate complications Comments:     Adenosine cardioversion successful with 6 mg initially.     (including critical care time)  CRITICAL CARE Performed by: Gwenyth Allegra Jeno Calleros Total critical care time: 40 minutes Critical care time was exclusive of separately billable procedures and treating other patients. Critical care was necessary to treat or prevent imminent or life-threatening deterioration. Critical care was time spent personally by me on the following activities: development of treatment plan with patient and/or surrogate as well as nursing, discussions with consultants, evaluation of patient's response to treatment, examination of patient, obtaining history from patient or surrogate, ordering and performing treatments and interventions, ordering and review of laboratory studies, ordering and review of radiographic studies, pulse oximetry and re-evaluation of patient's condition.   Medications Ordered in ED Medications  furosemide (LASIX) injection 40 mg (has no administration in time range)  metoprolol tartrate (LOPRESSOR) tablet 25 mg (25 mg  Oral Given 11/16/18 2307)  sodium chloride flush (NS) 0.9 % injection 3 mL (3 mLs Intravenous Given 11/16/18 1934)  adenosine (ADENOCARD) 6 MG/2ML injection 6 mg (6 mg Intravenous Given 11/16/18 1846)  furosemide (LASIX) injection 40 mg (40 mg Intravenous Given 11/16/18 2307)     Initial Impression / Assessment and Plan / ED Course  I have reviewed the triage vital signs and the nursing notes.  Pertinent labs & imaging  results that were available during my care of the patient were reviewed by me and considered in my medical decision making (see chart for details).        Beverly Tucker is a 78 y.o. female with a past medical history significant for CKD, hypertension, CHF, metastatic breast cancer with skin wound on chest, recent pneumonia, recent SVTs/paroxysmal atrial fibrillation with RVR, and recent thoracentesis who presents with fatigue and shortness of breath the last few days.  Patient worse over the last few days she has had worsening fatigue and shortness of breath and then today it acutely worsened.  She reports she could not catch her breath and was found to be very tachypneic and tachycardic with EMS.  Heart rate was in the 180s and SVT.  Patient was denying chest pain but was very tachypneic and appeared to look like she was starting to tire out on arrival.  Abdomen was nontender and chest was nontender.  Systolic murmur appreciated.  Patient is on Eliquis for the A. Fib.  On arrival, lungs have crackles and coarseness bilaterally.  Clinical I am concerned about fluid overload secondary to SVT worsening.  I am also concerned about pneumonia given her cough fatigue, and recent pneumonia with shortness of breath.  Initial EKG shows SVT with a rate in the 180s.  Vagal maneuver was attempted without success.  Spoke with patient and we agreed to do emergent procedure of cardioversion with adenosine to get her out of SVT.  Adenosine was given and she was converted to sinus  tachycardia.  Patient work-up to look for infection or other normality contributing to the symptoms of shortness of breath and tachycardia.  Work-up again to return showing elevated BNP from prior.  Kidney function similar to prior 1.29.  Troponin elevated at 32, will trend.  Leukocytosis is now present.  No lactic acidosis.  TSH normal.  Covid swab was sent.  Magnesium normal.  Chest x-ray returned showing evidence of bilateral haziness and opacities in the bases concerning for fluid versus infection.  Patient will need admission given the increased oxygen and likely new pleural effusions.  Will discuss with admitting team antibiotics given her normal lactic acid and lack of fever.  Patient will be admitted.    Final Clinical Impressions(s) / ED Diagnoses   Final diagnoses:  SOB (shortness of breath)  Hypoxia  SVT (supraventricular tachycardia) [facility])    ED Discharge Orders    None     Clinical Impression: 1. SOB (shortness of breath)   2. Hypoxia   3. SVT (supraventricular tachycardia) ([place])     Disposition: Admit  This note was prepared with assistance of Dragon voice recognition software. Occasional wrong-word or sound-a-like substitutions may have occurred due to the inherent limitations of voice recognition software.     Dortha Neighbors, Gwenyth Allegra, MD 11/16/18 [phone]

## 2018-11-16 NOTE — H&P (Signed)
History and Physical    Beverly Tucker [id number] DOB: 08/12/1940 DOA: 11/16/2018  Referring MD/NP/PA:   PCP: [name], MD   Patient coming from:  The patient is coming from home.  At baseline, pt is independent for most of ADL.        Chief Complaint: SOB, leg edema and palpitation  HPI: Beverly Tucker is a 78 y.o. female with medical history significant of hypertension, GERD, anxiety, metastasized breast cancer on oral chemo therapy, dCHF, atrial fibrillation on Eliquis, SVT, recurrent left pleural effusion, pneumothorax, CKD stage III, who presents with shortness of breath, leg edema and palpitation.  Patient states that she has been having shortness of breath for about 1 week, which has been progressively worse.  She has cough with watery mucus production, no chest pain, fever or chills.  Patient also has worsening bilateral leg edema.  She does not have nausea, vomiting, diarrhea, abdominal pain.  She was recently treated for UTI, currently no symptoms of UTI.  No unilateral weakness. Today she developed palpitation, and was found to have SVT with heart rates up to 180s in ED. Patient was given 6 mg of adenosine, converted to sinus rhythm.  Currently heart rate is in the 100s.  ED Course: pt was found to have troponin 32, WBC 13.7, BNP 183, negative COVID-19 test, lactic acid 1.8, renal function close to baseline, temperature normal, oxygen saturation 100% on room air, tachypnea, blood pressure 128/65.  Chest x-ray showed small bilateral pleural effusion which is somewhat new hazy opacity.  Patient is admitted to telemetry bed as inpatient. Review of Systems:   General: no fevers, chills, no body weight gain, has fatigue HEENT: no blurry vision, hearing changes or sore throat Respiratory: has dyspnea, coughing, no wheezing CV: no chest pain, has palpitations GI: no nausea, vomiting, abdominal pain, diarrhea, constipation GU: no dysuria, burning on urination, increased urinary  frequency, hematuria  Ext: has leg edema Neuro: no unilateral weakness, numbness, or tingling, no vision change or hearing loss Skin: no rash, no skin tear. MSK: No muscle spasm, no deformity, no limitation of range of movement in spin Heme: No easy bruising.  Travel history: No recent long distant travel.  Allergy: No Known Allergies  Past Medical History:  Diagnosis Date  . Acute CHF (congestive heart failure) ([place]) 10/2018  . Atrial fibrillation ([place])   . Cancer (HCC)    Breast   . GERD (gastroesophageal reflux disease)   . Hypertension     Past Surgical History:  Procedure Laterality Date  . CHOLECYSTECTOMY    . IR PERC PLEURAL DRAIN W/INDWELL CATH W/IMG GUIDE  11/08/2018  . IR THORACENTESIS ASP PLEURAL SPACE W/IMG GUIDE  10/22/2018    Social History:  reports that she has never smoked. She has never used smokeless tobacco. She reports that she does not drink alcohol or use drugs.  Family History:  Family History  Problem Relation Age of Onset  . Diabetes Mellitus II Other        son     Prior to Admission medications   Medication Sig Start Date End Date Taking? Authorizing Provider  abemaciclib (VERZENIO) 50 MG tablet Take 1 tablet (50 mg total) by mouth 2 (two) times daily. Swallow tablets whole. Do not chew, crush, or split tablets before swallowing. 11/04/18  Yes Brunetta Genera, MD  ALPRAZolam Duanne Moron) 0.5 MG tablet Take 0.25-0.5 mg by mouth 2 (two) times daily as needed for anxiety. 05/31/17  Yes [provider]  apixaban (ELIQUIS) 5 MG TABS tablet Take 1 tablet (5 mg total) by mouth 2 (two) times daily. 10/23/18  Yes Jeanmarie Hubert, MD  ergocalciferol (VITAMIN D2) 1.25 MG (50000 UT) capsule Take 1 capsule (50,000 Units total) by mouth once a week. Patient taking differently: Take 50,000 Units by mouth every Monday.  11/04/18  Yes Brunetta Genera, MD  letrozole [facility]) 2.5 MG tablet Take 1 tablet (2.5 mg total) by mouth daily. 11/04/18   Brunetta Genera, MD  metoprolol tartrate (LOPRESSOR) 25 MG tablet Take 1 tablet (25 mg total) by mouth 2 (two) times daily. 10/23/18   Jeanmarie Hubert, MD  oxyCODONE-acetaminophen (PERCOCET) 7.5-325 MG tablet Take 1 tablet by mouth every 4 (four) hours as needed for up to 7 days for moderate pain. 11/10/18 11/17/18  Nita Sells, MD    Physical Exam: Vitals:   11/17/18 0000 11/17/18 0008 11/17/18 0341 11/17/18 0700  BP: (!) 147/81  129/61   Pulse: 73  84   Resp: 17  (!) 29   Temp: 98.1 F (36.7 C)  98.1 F (36.7 C) 98.2 F (36.8 C)  TempSrc: Oral  Oral Oral  SpO2: 100%  100%   Weight:  82.4 kg 82.5 kg   Height:       General: Not in acute distress HEENT:       Eyes: PERRL, EOMI, no scleral icterus.       ENT: No discharge from the ears and nose, no pharynx injection, no tonsillar enlargement.        Neck: positive JVD, no bruit, no mass felt. Heme: No neck lymph node enlargement. Cardiac: S1/S2, RRR, has murmurs, No gallops or rubs. Respiratory: has fine crackles at the base.  S/p of left chest tube Abdomen: Soft, nondistended, nontender, no rebound pain, no organomegaly, BS present. GU: No hematuria Ext: 2+ pitting leg edema bilaterally. 2+DP/PT pulse bilaterally. Musculoskeletal: No joint deformities, No joint redness or warmth, no limitation of ROM in spin. Skin: No rashes.  Neuro: Alert, oriented X3, cranial nerves II-XII grossly intact, moves all extremities normally.  Psych: Patient is not psychotic, no suicidal or hemocidal ideation.  Labs on Admission: I have personally reviewed following labs and imaging studies  CBC: Recent Labs  Lab 11/13/18 0935 11/16/18 1840 11/17/18 0031  WBC 10.4 13.7* 11.9*  NEUTROABS 8.2*  --   --   HGB 10.1* 11.3* 9.9*  HCT 33.1* 37.5 32.3*  MCV 93.0 95.7 94.7  PLT 317 462* [id number]*   Basic Metabolic Panel: Recent Labs  Lab 11/13/18 0935 11/16/18 1840 11/17/18 0031  NA 140 138 141  K 4.0 4.7 3.9  CL 98 98 99  CO2 33* 27 31   GLUCOSE 112* 145* 114*  BUN 19 15 12   CREATININE 1.28* 1.29* 1.19*  CALCIUM 9.0 9.1 8.8*  MG  --  2.3 2.0   GFR: Estimated Creatinine Clearance: 41.5 mL/min (A) (by C-G formula based on SCr of 1.19 mg/dL (H)). Liver Function Tests: Recent Labs  Lab 11/13/18 0935 11/16/18 1840  AST 18 37  ALT 16 25  ALKPHOS 79 89  BILITOT 0.3 0.7  PROT 6.5 6.9  ALBUMIN 2.7* 3.0*   No results for input(s): LIPASE, AMYLASE in the last 168 hours. No results for input(s): AMMONIA in the last 168 hours. Coagulation Profile: No results for input(s): INR, PROTIME in the last 168 hours. Cardiac Enzymes: No results for input(s): CKTOTAL, CKMB, CKMBINDEX, TROPONINI in the last 168 hours. BNP (last 3 results) No results  for input(s): PROBNP in the last 8760 hours. HbA1C: No results for input(s): HGBA1C in the last 72 hours. CBG: No results for input(s): GLUCAP in the last 168 hours. Lipid Profile: Recent Labs    11/17/18 0031  CHOL 165  HDL 36*  LDLCALC 103*  TRIG 132  CHOLHDL 4.6   Thyroid Function Tests: Recent Labs    11/16/18 1840  TSH 2.545   Anemia Panel: No results for input(s): VITAMINB12, FOLATE, FERRITIN, TIBC, IRON, RETICCTPCT in the last 72 hours. Urine analysis:    Component Value Date/Time   COLORURINE YELLOW 11/07/2018 0017   APPEARANCEUR CLOUDY (A) 11/07/2018 0017   LABSPEC 1.013 11/07/2018 0017   PHURINE 6.0 11/07/2018 0017   GLUCOSEU NEGATIVE 11/07/2018 0017   HGBUR MODERATE (A) 11/07/2018 0017   BILIRUBINUR NEGATIVE 11/07/2018 0017   KETONESUR NEGATIVE 11/07/2018 0017   PROTEINUR NEGATIVE 11/07/2018 0017   NITRITE POSITIVE (A) 11/07/2018 0017   LEUKOCYTESUR LARGE (A) 11/07/2018 0017   Sepsis Labs: @LABRCNTIP (procalcitonin:4,lacticidven:4) ) Recent Results (from the past 240 hour(s))  Culture, Urine     Status: Abnormal   Collection Time: 11/07/18 10:58 PM   Specimen: Urine, Clean Catch  Result Value Ref Range Status   Specimen Description   Final     URINE, CLEAN CATCH Performed at [facility], [place] [address] [place], [place] [address]    Special Requests   Final    NONE Performed at [facility], [place] [address] [place], [place] [address]    Culture >=100,000 COLONIES/mL ESCHERICHIA COLI (A)  Final   Report Status 11/10/2018 FINAL  Final   Organism ID, Bacteria ESCHERICHIA COLI (A)  Final      Susceptibility   Escherichia coli - MIC*    AMPICILLIN >=32 RESISTANT Resistant     CEFAZOLIN 16 SENSITIVE Sensitive     CEFTRIAXONE <=1 SENSITIVE Sensitive     CIPROFLOXACIN >=4 RESISTANT Resistant     GENTAMICIN <=1 SENSITIVE Sensitive     IMIPENEM <=0.25 SENSITIVE Sensitive     NITROFURANTOIN <=16 SENSITIVE Sensitive     TRIMETH/SULFA >=320 RESISTANT Resistant     AMPICILLIN/SULBACTAM >=32 RESISTANT Resistant     PIP/TAZO <=4 SENSITIVE Sensitive     Extended ESBL NEGATIVE Sensitive     * >=100,000 COLONIES/mL ESCHERICHIA COLI  SARS Coronavirus 2 by RT PCR (hospital order, performed in [place] hospital lab) Nasopharyngeal Nasopharyngeal Swab     Status: None   Collection Time: 11/16/18  7:37 PM   Specimen: Nasopharyngeal Swab  Result Value Ref Range Status   SARS Coronavirus 2 NEGATIVE NEGATIVE Final    Comment: (NOTE) If result is NEGATIVE SARS-CoV-2 target nucleic acids are NOT DETECTED. The SARS-CoV-2 RNA is generally detectable in upper and lower  respiratory specimens during the acute phase of infection. The lowest  concentration of SARS-CoV-2 viral copies this assay can detect is 250  copies / mL. A negative result does not preclude SARS-CoV-2 infection  and should not be used as the sole basis for treatment or other  patient management decisions.  A negative result may occur with  improper specimen collection / handling, submission of specimen other  than nasopharyngeal swab, presence of viral mutation(s) within the  areas targeted by this assay, and inadequate number of viral  copies  (<250 copies / mL). A negative result must be combined with clinical  observations, patient history, and epidemiological information. If result is POSITIVE SARS-CoV-2 target nucleic acids are DETECTED. The SARS-CoV-2 RNA is  generally detectable in upper and lower  respiratory specimens dur ing the acute phase of infection.  Positive  results are indicative of active infection with SARS-CoV-2.  Clinical  correlation with patient history and other diagnostic information is  necessary to determine patient infection status.  Positive results do  not rule out bacterial infection or co-infection with other viruses. If result is PRESUMPTIVE POSTIVE SARS-CoV-2 nucleic acids MAY BE PRESENT.   A presumptive positive result was obtained on the submitted specimen  and confirmed on repeat testing.  While 2019 novel coronavirus  (SARS-CoV-2) nucleic acids may be present in the submitted sample  additional confirmatory testing may be necessary for epidemiological  and / or clinical management purposes  to differentiate between  SARS-CoV-2 and other Sarbecovirus currently known to infect humans.  If clinically indicated additional testing with an alternate test  methodology [phone]) is advised. The SARS-CoV-2 RNA is generally  detectable in upper and lower respiratory sp ecimens during the acute  phase of infection. The expected result is Negative. Fact Sheet for Patients:  StrictlyIdeas.no Fact Sheet for Healthcare Providers: BankingDealers.co.za This test is not yet approved or cleared by the [place] FDA and has been authorized for detection and/or diagnosis of SARS-CoV-2 by FDA under an Emergency Use Authorization (EUA).  This EUA will remain in effect (meaning this test can be used) for the duration of the COVID-19 declaration under Section 564(b)(1) of the Act, 21 U.S.C. section 360bbb-3(b)(1), unless the authorization is terminated  or revoked sooner. Performed at [facility], [place] [address] [place], [place] [address]   MRSA PCR Screening     Status: None   Collection Time: 11/17/18 12:15 AM   Specimen: Nasal Mucosa; Nasopharyngeal  Result Value Ref Range Status   MRSA by PCR NEGATIVE NEGATIVE Final    Comment:        The GeneXpert MRSA Assay (FDA approved for NASAL specimens only), is one component of a comprehensive MRSA colonization surveillance program. It is not intended to diagnose MRSA infection nor to guide or monitor treatment for MRSA infections. Performed at [facility], [place] [address] [place], [place] [address]      Radiological Exams on Admission: Dg Chest Port 1 View  Result Date: 11/17/2018 CLINICAL DATA:  Shortness of breath. EXAM: PORTABLE CHEST 1 VIEW COMPARISON:  Radiograph yesterday. CT 10/16/2018 FINDINGS: Left PleurX catheter remains in place. Low lung volumes with bibasilar volume loss. Hazy bibasilar opacities consistent with pleural effusions adjacent atelectasis/consolidation. Cardiomegaly, not well assessed due to adjacent airspace disease. Worsening vascular congestion. No visualized pneumothorax. IMPRESSION: 1. Worsening vascular congestion. 2. Bibasilar opacities consistent with pleural effusions and adjacent atelectasis/consolidation. 3. Left PleurX catheter remains in place. No visualized pneumothorax. 4. Cardiomegaly. Electronically Signed   By: Keith Rake M.D.   On: 11/17/2018 02:19   Dg Chest Port 1 View  Result Date: 11/16/2018 CLINICAL DATA:  Shortness of breath EXAM: PORTABLE CHEST 1 VIEW COMPARISON:  November 09, 2018 FINDINGS: There is mild cardiomegaly. Again noted is a left-sided pleural catheter. Aortic knob calcifications. There is a small left pleural effusion with patchy airspace opacity at the left lung base which is new. There is a trace right pleural effusion with hazy airspace opacity at the right lung base. IMPRESSION: Small bilateral  pleural effusions, left greater than right with new hazy airspace opacities at both lung bases which could be due to infectious etiology, layering effusion, and/or edema. Cardiomegaly Left-sided pleural catheter. Electronically Signed  By: Prudencio Pair M.D.   On: 11/16/2018 19:15     EKG: Independently reviewed.  First EKG showed SVT with heart rates up to 175, low voltage.  After giving adenosine, repeat EKG showed sinus rhythm, heart rate 111, low voltage.   Assessment/Plan Principal Problem:   SVT (supraventricular tachycardia) (HCC) Active Problems:   CKD (chronic kidney disease) stage 3, GFR 30-59 ml/min   HTN (hypertension)   Metastatic breast cancer (HCC)   Pleural effusion   Acute on chronic diastolic CHF (congestive heart failure) (HCC)   Elevated troponin   Leukocytosis   Atrial fibrillation, chronic (HCC)   SVT (supraventricular tachycardia) ([place]): Heart rate up to 180s, currently converted with adenosine treatment.  Likely triggered by CHF exacerbation.  TSH normal 2.545 today. -Admit to telemetry bed as inpatient -Telemetry monitor  Acute on chronic diastolic CHF (congestive heart failure): 2D echo on 10/15/2018 showed EF of 65-70% with grade 1 diastolic dysfunction.  Patient has a 2+ leg edema, positive JVD, clinically consistent with CHF exacerbation. -Lasix 40 mg bid by IV -2d echo -Daily weights -strict I/O's -Low salt diet -Fluid restriction  Elevated troponin: Troponin 32.  Most likely due to demand ischemia secondary to CHF exacerbation and SVT - Trend Trop - Repeat EKG in the am  - prn Nitroglycerin - will not start ASA since pt in on Eliquis - Risk factor stratification: will check FLP and A1C  - check UDS  CKD (chronic kidney disease) stage 3, GFR 30-59 ml/min: stable.  Baseline creatinine 1.0-1.3.  Her creatinine is 1.29, BUN 15 -f/u by BMP  HTN (hypertension): -As needed hydralazine orally -Patient is on IV Lasix - Metoprolol  Metastatic  breast cancer ([place]): -continue home letrozole and Verzenio  Pleural effusion: s/p of thoracentesis on 10/7. S/p of left chest tube. Chest x-ray showed small bilateral effusion. -observe closely  Leukocytosis: WBC 13.7.  Chest x-ray showed some new hazy opacity -Start Z pak empirically -Follow-up of blood culture  Atrial fibrillation, chronic (HCC):CHA2DS2-VASc Score is 5, needs oral anticoagulation. Patient is on Eliquis at home. I -Continue metoprolol and Eliquis   Inpatient status:  # Patient requires inpatient status due to high intensity of service, high risk for further deterioration and high frequency of surveillance required.  I certify that at the point of admission it is my clinical judgment that the patient will require inpatient hospital care spanning beyond 2 midnights from the point of admission.  . This patient has multiple chronic comorbidities including hypertension, GERD, anxiety, metastasized breast cancer on oral chemo therapy, dCHF, atrial fibrillation on Eliquis, SVT, recurrent left pleural effusion, pneumothorax, CKD stage III, . Now patient has presenting with SVT, CHF exacerbation, elevated troponin, leukocytosis . The worrisome physical exam findings include 2+ leg edema, positive JVD, . The initial radiographic and laboratory data are worrisome because of elevated BNP 183, troponin elevation, leukocytosis, new hazy opacity on chest x-ray . Current medical needs: please see my assessment and plan . Predictability of an adverse outcome (risk): Patient has multiple complaints, now presents with SVT, CHF exacerbation, elevated troponin, leukocytosis.  Her presentation is highly complicated.  Patient is at high risk of deterioration.  Will need to be treated in hospital for at least 2 days.           DVT ppx: on Eliquis Code Status: Full code Family Communication: Yes, patient's  son  at bed side Disposition Plan:  Anticipate discharge back to previous home  environment Consults called:  none Admission  status: Inpatient/tele     Date of Service 11/17/2018    Ivor Costa [facility]   If 7PM-7AM, please contact night-coverage [email or web address] Password [email or web address] 11/17/2018, 8:40 AM

## 2018-11-16 NOTE — ED Notes (Signed)
Pt states that she is unable to urinate at this time, but will inform us when she is ready to.

## 2018-11-16 NOTE — ED Notes (Addendum)
Pt had a "coughing spell" oxygen dropped to 78% on 2L and remained there, increased pt to 6L with no change,  pt placed back on NRB

## 2018-11-16 NOTE — ED Notes (Signed)
HR st.

## 2018-11-16 NOTE — ED Triage Notes (Signed)
Pt was here on 10/5 with svt and pnx.  2 days ago pt had fluid drained from her lungs.  She began feeling sob last night.  When ems arrived pt with hr of 180, sats of 100% on 2L with rr of 40. Pt ao x 4.

## 2018-11-17 ENCOUNTER — Inpatient Hospital Stay (HOSPITAL_COMMUNITY): Payer: Medicare HMO

## 2018-11-17 ENCOUNTER — Other Ambulatory Visit (HOSPITAL_COMMUNITY): Payer: Medicare HMO

## 2018-11-17 LAB — BASIC METABOLIC PANEL
Anion gap: 11 (ref 5–15)
BUN: 12 mg/dL (ref 8–23)
CO2: 31 mmol/L (ref 22–32)
Calcium: 8.8 mg/dL — ABNORMAL LOW (ref 8.9–10.3)
Chloride: 99 mmol/L (ref 98–111)
Creatinine, Ser: 1.19 mg/dL — ABNORMAL HIGH (ref 0.44–1.00)
GFR calc Af Amer: 50 mL/min — ABNORMAL LOW (ref >60)
GFR calc non Af Amer: 43 mL/min — ABNORMAL LOW (ref >60)
Glucose, Bld: 114 mg/dL — ABNORMAL HIGH (ref 70–99)
Potassium: 3.9 mmol/L (ref 3.5–5.1)
Sodium: 141 mmol/L (ref 135–145)

## 2018-11-17 LAB — TROPONIN I (HIGH SENSITIVITY)
Troponin I (High Sensitivity): 49 ng/L — ABNORMAL HIGH (ref <18)
Troponin I (High Sensitivity): 46 ng/L — ABNORMAL HIGH (ref <18)

## 2018-11-17 LAB — MRSA PCR SCREENING: MRSA by PCR: NEGATIVE

## 2018-11-17 LAB — CBC
HCT: 32.3 % — ABNORMAL LOW (ref 36.0–46.0)
Hemoglobin: 9.9 g/dL — ABNORMAL LOW (ref 12.0–15.0)
MCH: 29 pg (ref 26.0–34.0)
MCHC: 30.7 g/dL (ref 30.0–36.0)
MCV: 94.7 fL (ref 80.0–100.0)
Platelets: 409 10*3/uL — ABNORMAL HIGH (ref 150–400)
RBC: 3.41 MIL/uL — ABNORMAL LOW (ref 3.87–5.11)
RDW: 14.6 % (ref 11.5–15.5)
WBC: 11.9 10*3/uL — ABNORMAL HIGH (ref 4.0–10.5)
nRBC: 0 % (ref 0.0–0.2)

## 2018-11-17 LAB — LIPID PANEL
Cholesterol: 165 mg/dL (ref 0–200)
HDL: 36 mg/dL — ABNORMAL LOW (ref >40)
LDL Cholesterol: 103 mg/dL — ABNORMAL HIGH (ref 0–99)
Total CHOL/HDL Ratio: 4.6 RATIO
Triglycerides: 132 mg/dL (ref <150)
VLDL: 26 mg/dL (ref 0–40)

## 2018-11-17 LAB — LACTIC ACID, PLASMA: Lactic Acid, Venous: 1.6 mmol/L (ref 0.5–1.9)

## 2018-11-17 LAB — MAGNESIUM: Magnesium: 2 mg/dL (ref 1.7–2.4)

## 2018-11-17 MED ORDER — VITAMIN D (ERGOCALCIFEROL) 1.25 MG (50000 UNIT) PO CAPS
50000.0000 [IU] | ORAL_CAPSULE | ORAL | Status: DC
  Administered 2018-11-18: 50000 [IU] via ORAL
  Filled 2018-11-17: qty 1

## 2018-11-17 MED ORDER — SODIUM CHLORIDE 0.9 % IV SOLN: 250.0000 mL | INTRAVENOUS | Status: DC | PRN

## 2018-11-17 MED ORDER — APIXABAN 5 MG PO TABS
5.0000 mg | ORAL_TABLET | Freq: Two times a day (BID) | ORAL | Status: DC
  Administered 2018-11-17 – 2018-11-19 (×6): 5 mg via ORAL
  Filled 2018-11-17 (×6): qty 1

## 2018-11-17 MED ORDER — HYDRALAZINE HCL 25 MG PO TABS: 25.0000 mg | ORAL_TABLET | Freq: Three times a day (TID) | ORAL | Status: DC | PRN

## 2018-11-17 MED ORDER — OXYCODONE-ACETAMINOPHEN 7.5-325 MG PO TABS
1.0000 | ORAL_TABLET | ORAL | Status: DC | PRN
  Administered 2018-11-22: 18:00:00 1 via ORAL
  Filled 2018-11-17: qty 1

## 2018-11-17 MED ORDER — METOPROLOL TARTRATE 5 MG/5ML IV SOLN
INTRAVENOUS | Status: AC
  Filled 2018-11-17: qty 5

## 2018-11-17 MED ORDER — ALBUTEROL SULFATE (2.5 MG/3ML) 0.083% IN NEBU: 2.5000 mg | INHALATION_SOLUTION | Freq: Four times a day (QID) | RESPIRATORY_TRACT | Status: DC | PRN

## 2018-11-17 MED ORDER — NITROGLYCERIN 0.4 MG SL SUBL: 0.4000 mg | SUBLINGUAL_TABLET | SUBLINGUAL | Status: DC | PRN

## 2018-11-17 MED ORDER — ENSURE ENLIVE PO LIQD
237.0000 mL | Freq: Two times a day (BID) | ORAL | Status: DC
  Administered 2018-11-18 – 2018-11-23 (×10): 237 mL via ORAL

## 2018-11-17 MED ORDER — LEVALBUTEROL TARTRATE 45 MCG/ACT IN AERO
2.0000 | INHALATION_SPRAY | Freq: Four times a day (QID) | RESPIRATORY_TRACT | Status: DC | PRN
  Filled 2018-11-17: qty 15

## 2018-11-17 MED ORDER — ALPRAZOLAM 0.25 MG PO TABS
0.2500 mg | ORAL_TABLET | Freq: Two times a day (BID) | ORAL | Status: DC | PRN
  Administered 2018-11-20: 0.25 mg via ORAL
  Administered 2018-11-20: 0.5 mg via ORAL
  Administered 2018-11-24: 09:00:00 0.25 mg via ORAL
  Filled 2018-11-17: qty 2
  Filled 2018-11-17 (×2): qty 1

## 2018-11-17 MED ORDER — AZITHROMYCIN 500 MG PO TABS
500.0000 mg | ORAL_TABLET | Freq: Every day | ORAL | Status: AC
  Administered 2018-11-17: 10:00:00 500 mg via ORAL
  Filled 2018-11-17: qty 1

## 2018-11-17 MED ORDER — DM-GUAIFENESIN ER 30-600 MG PO TB12
1.0000 | ORAL_TABLET | Freq: Two times a day (BID) | ORAL | Status: DC | PRN
  Administered 2018-11-17: 01:00:00 1 via ORAL
  Filled 2018-11-17: qty 1

## 2018-11-17 MED ORDER — SODIUM CHLORIDE 0.9% FLUSH: 3.0000 mL | INTRAVENOUS | Status: DC | PRN

## 2018-11-17 MED ORDER — ONDANSETRON HCL 4 MG/2ML IJ SOLN
4.0000 mg | Freq: Four times a day (QID) | INTRAMUSCULAR | Status: DC | PRN
  Administered 2018-11-18 – 2018-11-24 (×8): 4 mg via INTRAVENOUS
  Filled 2018-11-17 (×8): qty 2

## 2018-11-17 MED ORDER — METOPROLOL TARTRATE 5 MG/5ML IV SOLN
5.0000 mg | Freq: Once | INTRAVENOUS | Status: DC
  Filled 2018-11-17: qty 5

## 2018-11-17 MED ORDER — ACETAMINOPHEN 325 MG PO TABS: 650.0000 mg | ORAL_TABLET | ORAL | Status: DC | PRN

## 2018-11-17 MED ORDER — LOPERAMIDE HCL 2 MG PO CAPS
2.0000 mg | ORAL_CAPSULE | ORAL | Status: DC | PRN
  Administered 2018-11-17 – 2018-11-23 (×6): 2 mg via ORAL
  Filled 2018-11-17 (×6): qty 1

## 2018-11-17 MED ORDER — SODIUM CHLORIDE 0.9% FLUSH
3.0000 mL | Freq: Two times a day (BID) | INTRAVENOUS | Status: DC
  Administered 2018-11-17 – 2018-11-24 (×13): 3 mL via INTRAVENOUS

## 2018-11-17 MED ORDER — AZITHROMYCIN 500 MG PO TABS: 250.0000 mg | ORAL_TABLET | Freq: Every day | ORAL | Status: DC

## 2018-11-17 MED ORDER — LETROZOLE 2.5 MG PO TABS
2.5000 mg | ORAL_TABLET | Freq: Every day | ORAL | Status: DC
  Administered 2018-11-17 – 2018-11-23 (×7): 2.5 mg via ORAL
  Filled 2018-11-17 (×7): qty 1

## 2018-11-17 MED ORDER — ABEMACICLIB 50 MG PO TABS
50.0000 mg | ORAL_TABLET | Freq: Two times a day (BID) | ORAL | Status: DC
  Administered 2018-11-17 – 2018-11-23 (×14): 50 mg via ORAL
  Filled 2018-11-17 (×15): qty 1

## 2018-11-17 NOTE — Progress Notes (Signed)
PROGRESS NOTE    Beverly Tucker   [id number] DOB: [birth year] DOA: 11/16/2018  Admitted from: Home.  Typically independent for most of the ADL PCP: [name], MD   Hospital Summary  Is a 78 year old female with past medical history of hypertension, GERD, anxiety, metastatic right breast cancer recently started on abemaciclib on 10/12 and on letrozole (follows with Dr. Roney Marion appointment 10/5), recurrent malignant pleural effusion with Pleurx, diastolic heart failure, atrial fibrillation on Eliquis, SVT, pneumothorax, CKD 3 who presented to the ED on 10/17 with 1 week of worsening shortness of breath, leg edema and palpitations.  In the ED patient was found to have SVT with heart rate up to 180s and was given 6 mg of adenosine with conversion to NSR.  She was found to have presentation troponin 32, WBC 13.7, BNP 183, lactic acid 1.8, - Covid.  Chest x-ray showed small bilateral pleural effusions and somewhat new hazy opacity and started on empiric azithromycin.  She was admitted for acute on chronic diastolic heart failure exacerbation received IV diuresis.  Overnight 10/17-10/18 patient had a recurrence of SVT which continuously converted to NSR without any medical intervention.  A & P   Principal Problem:   SVT (supraventricular tachycardia) (HCC) Active Problems:   CKD (chronic kidney disease) stage 3, GFR 30-59 ml/min   HTN (hypertension)   Metastatic breast cancer (HCC)   Pleural effusion   Acute on chronic diastolic CHF (congestive heart failure) (HCC)   Elevated troponin   Leukocytosis   Atrial fibrillation, chronic (HCC)   SVT status post adenosine 6 mg x 1 in ED with conversion to NSR and again with brief recurrence of SVT overnight which spontaneously converted to NSR.  Likely triggered by CHF exacerbation . Telemetry . Consider cardiology consult if recurs after diuresis  Acute on chronic diastolic heart failure last echo (10/15/18) EF 65 to 70%.  Diuresed overnight,  still volume overloaded . Echo pending . Continue Lasix 40 mg IV twice daily . Continue heart failure protocol with daily weights, strict I's/O, low-salt diet and fluid restriction  Elevated troponin troponin 32 which peaked at 49.  Suspect demand ischemia in setting of heart failure and SVT.  EKG at baseline . Continue telemetry  CKD 3a at baseline.Monitor  Hypertension stable . Metoprolol . As needed hydralazine p.o. . Continue Lasix  Metastatic breast cancer patient of Dr. [name], Oncology. On letrozole and abemaciclib (recently started abemaciclib 10/12)  will reach out to oncology in a.m.  Recurrent malignant pleural effusion Pleurx drain placed 10/9 . Repeat chest x-ray in a.m.  Leukocytosis 13.7->11.9. Afebrile. On Azythromycin for empiric coverage.  Shortness of breath and cough is likely due to acute on chronic heart failure, although she does have a hazy opacity on initial chest x-ray . Follow-up chest x-ray in a.m. . Follow-up leukocytosis . Consider discontinuing antibiotics in am   Atrial fibrillation CHA2DS2-VASc: 5 on Eliquis . Continue Eliquis and Toprol . Replete electrolytes as necessary  DVT prophylaxis: Eliquis   Code Status: Full Code  Diet: Heart healthy Disposition Plan: Pending medical stability  Consultants  . None  Procedures  . Adenosine 6 mg x 1 for SVT  Antibiotics  Day 1/5 of azithromycin for empiric coverage of possible respiratory tract infection      Subjective   Patient seen and examined at bedside no acute distress and resting comfortably.  She was seen this morning just after first Lasix dose was given for today.  At the time she  was complaining of shortness of breath.  Denies using oxygen at baseline but was requiring oxygen today.  Overnight patient had an episode of SVT which spontaneously converted to NSR prior to metoprolol administration.  She denies that she had chest pain or palpitations at that time but states she just did  not feel well.  Currently states that she is somewhat short of breath with leg swelling.  States that her left leg is always more swollen than her right leg but both legs are swollen today more so than usual.  Denies any nausea or vomiting or urinary complaints.   Objective   Vitals:   11/17/18 0341 11/17/18 0700 11/17/18 0800 11/17/18 1200  BP: 129/61  (!) 136/53   Pulse: 84  80   Resp: (!) 29  (!) 29   Temp: 98.1 F (36.7 C) 98.2 F (36.8 C)  98 F (36.7 C)  TempSrc: Oral Oral  Oral  SpO2: 100%  100%   Weight: 82.5 kg     Height:        Intake/Output Summary (Last 24 hours) at 11/17/2018 1435 Last data filed at 11/17/2018 1115 Gross per 24 hour  Intake 240 ml  Output 1000 ml  Net -760 ml   Filed Weights   11/16/18 1934 11/17/18 0008 11/17/18 0341  Weight: 82.1 kg 82.4 kg 82.5 kg    Examination:  Physical Exam Vitals signs and nursing note reviewed.  Constitutional:      Appearance: She is well-developed.  HENT:     Head: Normocephalic.  Eyes:     Extraocular Movements: Extraocular movements intact.  Cardiovascular:     Rate and Rhythm: Normal rate and regular rhythm.  Pulmonary:     Breath sounds: Examination of the right-lower field reveals rales. Examination of the left-lower field reveals rales. Rales present.     Comments: Mildly increased breathing Abdominal:     General: Bowel sounds are normal.     Palpations: Abdomen is soft.  Musculoskeletal:     Comments: Bilateral lower extremity edema, left greater than right  No Homans' sign  Neurological:     General: No focal deficit present.     Mental Status: She is alert.  Psychiatric:        Mood and Affect: Mood normal.        Behavior: Behavior normal.     Data Reviewed: I have personally reviewed following labs and imaging studies  CBC: Recent Labs  Lab 11/13/18 0935 11/16/18 1840 11/17/18 0031  WBC 10.4 13.7* 11.9*  NEUTROABS 8.2*  --   --   HGB 10.1* 11.3* 9.9*  HCT 33.1* 37.5 32.3*   MCV 93.0 95.7 94.7  PLT 317 462* [id number]*   Basic Metabolic Panel: Recent Labs  Lab 11/13/18 0935 11/16/18 1840 11/17/18 0031  NA 140 138 141  K 4.0 4.7 3.9  CL 98 98 99  CO2 33* 27 31  GLUCOSE 112* 145* 114*  BUN 19 15 12   CREATININE 1.28* 1.29* 1.19*  CALCIUM 9.0 9.1 8.8*  MG  --  2.3 2.0   GFR: Estimated Creatinine Clearance: 41.5 mL/min (A) (by C-G formula based on SCr of 1.19 mg/dL (H)). Liver Function Tests: Recent Labs  Lab 11/13/18 0935 11/16/18 1840  AST 18 37  ALT 16 25  ALKPHOS 79 89  BILITOT 0.3 0.7  PROT 6.5 6.9  ALBUMIN 2.7* 3.0*   No results for input(s): LIPASE, AMYLASE in the last 168 hours. No results for input(s): AMMONIA in the  last 168 hours. Coagulation Profile: No results for input(s): INR, PROTIME in the last 168 hours. Cardiac Enzymes: No results for input(s): CKTOTAL, CKMB, CKMBINDEX, TROPONINI in the last 168 hours. BNP (last 3 results) No results for input(s): PROBNP in the last 8760 hours. HbA1C: No results for input(s): HGBA1C in the last 72 hours. CBG: No results for input(s): GLUCAP in the last 168 hours. Lipid Profile: Recent Labs    11/17/18 0031  CHOL 165  HDL 36*  LDLCALC 103*  TRIG 132  CHOLHDL 4.6   Thyroid Function Tests: Recent Labs    11/16/18 1840  TSH 2.545   Anemia Panel: No results for input(s): VITAMINB12, FOLATE, FERRITIN, TIBC, IRON, RETICCTPCT in the last 72 hours. Sepsis Labs: Recent Labs  Lab 11/16/18 1840 11/17/18 0030  LATICACIDVEN 1.8 1.6    Recent Results (from the past 240 hour(s))  Culture, Urine     Status: Abnormal   Collection Time: 11/07/18 10:58 PM   Specimen: Urine, Clean Catch  Result Value Ref Range Status   Specimen Description   Final    URINE, CLEAN CATCH Performed at [facility], [place] [address] [place], [place] [id number]    Special Requests   Final    NONE Performed at [facility], [place] [address] [place], [place] [address]     Culture >=100,000 COLONIES/mL ESCHERICHIA COLI (A)  Final   Report Status 11/10/2018 FINAL  Final   Organism ID, Bacteria ESCHERICHIA COLI (A)  Final      Susceptibility   Escherichia coli - MIC*    AMPICILLIN >=32 RESISTANT Resistant     CEFAZOLIN 16 SENSITIVE Sensitive     CEFTRIAXONE <=1 SENSITIVE Sensitive     CIPROFLOXACIN >=4 RESISTANT Resistant     GENTAMICIN <=1 SENSITIVE Sensitive     IMIPENEM <=0.25 SENSITIVE Sensitive     NITROFURANTOIN <=16 SENSITIVE Sensitive     TRIMETH/SULFA >=320 RESISTANT Resistant     AMPICILLIN/SULBACTAM >=32 RESISTANT Resistant     PIP/TAZO <=4 SENSITIVE Sensitive     Extended ESBL NEGATIVE Sensitive     * >=100,000 COLONIES/mL ESCHERICHIA COLI  SARS Coronavirus 2 by RT PCR (hospital order, performed in [place] hospital lab) Nasopharyngeal Nasopharyngeal Swab     Status: None   Collection Time: 11/16/18  7:37 PM   Specimen: Nasopharyngeal Swab  Result Value Ref Range Status   SARS Coronavirus 2 NEGATIVE NEGATIVE Final    Comment: (NOTE) If result is NEGATIVE SARS-CoV-2 target nucleic acids are NOT DETECTED. The SARS-CoV-2 RNA is generally detectable in upper and lower  respiratory specimens during the acute phase of infection. The lowest  concentration of SARS-CoV-2 viral copies this assay can detect is 250  copies / mL. A negative result does not preclude SARS-CoV-2 infection  and should not be used as the sole basis for treatment or other  patient management decisions.  A negative result may occur with  improper specimen collection / handling, submission of specimen other  than nasopharyngeal swab, presence of viral mutation(s) within the  areas targeted by this assay, and inadequate number of viral copies  (<250 copies / mL). A negative result must be combined with clinical  observations, patient history, and epidemiological information. If result is POSITIVE SARS-CoV-2 target nucleic acids are DETECTED. The SARS-CoV-2 RNA is generally  detectable in upper and lower  respiratory specimens dur ing the acute phase of infection.  Positive  results are indicative of active infection with SARS-CoV-2.  Clinical  correlation with patient history and other diagnostic information is  necessary to determine patient infection status.  Positive results do  not rule out bacterial infection or co-infection with other viruses. If result is PRESUMPTIVE POSTIVE SARS-CoV-2 nucleic acids MAY BE PRESENT.   A presumptive positive result was obtained on the submitted specimen  and confirmed on repeat testing.  While 2019 novel coronavirus  (SARS-CoV-2) nucleic acids may be present in the submitted sample  additional confirmatory testing may be necessary for epidemiological  and / or clinical management purposes  to differentiate between  SARS-CoV-2 and other Sarbecovirus currently known to infect humans.  If clinically indicated additional testing with an alternate test  methodology [phone]) is advised. The SARS-CoV-2 RNA is generally  detectable in upper and lower respiratory sp ecimens during the acute  phase of infection. The expected result is Negative. Fact Sheet for Patients:  StrictlyIdeas.no Fact Sheet for Healthcare Providers: BankingDealers.co.za This test is not yet approved or cleared by the [place] FDA and has been authorized for detection and/or diagnosis of SARS-CoV-2 by FDA under an Emergency Use Authorization (EUA).  This EUA will remain in effect (meaning this test can be used) for the duration of the COVID-19 declaration under Section 564(b)(1) of the Act, 21 U.S.C. section 360bbb-3(b)(1), unless the authorization is terminated or revoked sooner. Performed at [facility], [place] [address] [place], [place] [address]   MRSA PCR Screening     Status: None   Collection Time: 11/17/18 12:15 AM   Specimen: Nasal Mucosa; Nasopharyngeal  Result Value Ref Range  Status   MRSA by PCR NEGATIVE NEGATIVE Final    Comment:        The GeneXpert MRSA Assay (FDA approved for NASAL specimens only), is one component of a comprehensive MRSA colonization surveillance program. It is not intended to diagnose MRSA infection nor to guide or monitor treatment for MRSA infections. Performed at [facility], [place] [address] [place], [place] [address]          Radiology Studies: Dg Chest Port 1 View  Result Date: 11/17/2018 CLINICAL DATA:  Shortness of breath. EXAM: PORTABLE CHEST 1 VIEW COMPARISON:  Radiograph yesterday. CT 10/16/2018 FINDINGS: Left PleurX catheter remains in place. Low lung volumes with bibasilar volume loss. Hazy bibasilar opacities consistent with pleural effusions adjacent atelectasis/consolidation. Cardiomegaly, not well assessed due to adjacent airspace disease. Worsening vascular congestion. No visualized pneumothorax. IMPRESSION: 1. Worsening vascular congestion. 2. Bibasilar opacities consistent with pleural effusions and adjacent atelectasis/consolidation. 3. Left PleurX catheter remains in place. No visualized pneumothorax. 4. Cardiomegaly. Electronically Signed   By: Keith Rake M.D.   On: 11/17/2018 02:19   Dg Chest Port 1 View  Result Date: 11/16/2018 CLINICAL DATA:  Shortness of breath EXAM: PORTABLE CHEST 1 VIEW COMPARISON:  November 09, 2018 FINDINGS: There is mild cardiomegaly. Again noted is a left-sided pleural catheter. Aortic knob calcifications. There is a small left pleural effusion with patchy airspace opacity at the left lung base which is new. There is a trace right pleural effusion with hazy airspace opacity at the right lung base. IMPRESSION: Small bilateral pleural effusions, left greater than right with new hazy airspace opacities at both lung bases which could be due to infectious etiology, layering effusion, and/or edema. Cardiomegaly Left-sided pleural catheter. Electronically Signed   By: Prudencio Pair  M.D.   On: 11/16/2018 19:15        Scheduled Meds: . abemaciclib  50 mg Oral BID  .  apixaban  5 mg Oral BID  . [START ON 11/18/2018] azithromycin  250 mg Oral Daily  . feeding supplement (ENSURE ENLIVE)  237 mL Oral BID BM  . furosemide  40 mg Intravenous Q12H  . letrozole  2.5 mg Oral Daily  . metoprolol tartrate  5 mg Intravenous Once  . metoprolol tartrate  25 mg Oral BID  . sodium chloride flush  3 mL Intravenous Q12H  . [START ON 11/18/2018] Vitamin D (Ergocalciferol)  50,000 Units Oral Q Mon   Continuous Infusions: . sodium chloride       LOS: 1 day    Time spent: 30 minutes     Harold Hedge, DO [facility] Pager [phone]  If 7PM-7AM, please contact night-coverage [email or web address] Password [email or web address] 11/17/2018, 2:35 PM

## 2018-11-17 NOTE — Significant Event (Addendum)
Rapid Response Event Note  Overview:Called d/t SVT-160s. Pt required cardioversion in ED for SVT with 6mg  adenosine. Pt converted to SR at that time. Time Called: 0112 Arrival Time: 0117 Event Type: Respiratory  Initial Focused Assessment: Pt laying in bed, alert and oriented, with mild accessory muscle use. Pt c/o mild SOB and denies chest pain. T-98.1, HR-155(SVT), BP-122/82, RR-35, SpO2-100% on 3L  [place]. +murmur. R lung coarse t/o, L lung very diminished t/o. Skin warm and dry.   At [place], pt HR converted to SR-80s  Interventions: EKG-SVT 5mg  metoprolol IV x 1-not given, pt converted to SR-80s with no interventions. PCXR for diminished lung sounds on L:  1. Worsening vascular congestion. 2. Bibasilar opacities consistent with pleural effusions and adjacent atelectasis/consolidation. 3. Left PleurX catheter remains in place. No visualized pneumothorax. 4. Cardiomegaly. Plan of Care (if not transferred): Continue to monitor pt. Call RRT if further assistance needed. Event Summary: Name of Physician Notified: Bodenheimer, NP at [place]    at    Outcome: Stayed in room and stabalized  Event End Time: 0145  Dillard [place]

## 2018-11-17 NOTE — Progress Notes (Signed)
Drained PleurX per Dr. Prescott Gum. No complications, tolerated well. 251mL cloudy, amber serous fluid removed. Dressing replaced.

## 2018-11-17 NOTE — Progress Notes (Addendum)
Patient's HR presented in the 150's at approximately 0110. Patient was alert and oriented and sitting in the bed at the time. Vagal maneuvers attempted, but unsuccessful. Patient not c/o of any chest pain, but is experiencing sob.  Rapid response called and EKG obtained. Patient found to be in SVT.  Bodenheimer called and metoprolol ordered.  Patient converted back to NSR prior to administration of metoprolol. EKG obtained to confirm conversion.  Will continue to monitor patient closely for any changes.

## 2018-11-17 NOTE — Progress Notes (Signed)
Patient home meds sent to pharmacy. See home med review in chart.

## 2018-11-17 NOTE — TOC Initial Note (Signed)
Transition of Care [facility]) - Initial/Assessment Note    Patient Details  Name: Beverly Tucker MRN: [id number] Date of Birth: 03-Sep-1940  [facility]) CM/SW Contact:    Carles Collet, RN Phone Number: 11/17/2018, 12:10 PM  Clinical Narrative:                 Patient admitted from home, strong family support. Confirmed she is active w [name]. TOC will continue to follow this high readmission risk patient for DC planning.    Expected Discharge Plan: [place] Barriers to Discharge: Continued Medical Work up   Patient Goals and CMS Choice        Expected Discharge Plan and Services Expected Discharge Plan: [place]                                              Prior Living Arrangements/Services                       Activities of Daily Living Home Assistive Devices/Equipment: Oxygen, Dentures (specify type), Walker (specify type), Bedside commode/3-in-1 ADL Screening (condition at time of admission) Patient's cognitive ability adequate to safely complete daily activities?: Yes Is the patient deaf or have difficulty hearing?: No Does the patient have difficulty seeing, even when wearing glasses/contacts?: No Does the patient have difficulty concentrating, remembering, or making decisions?: No Patient able to express need for assistance with ADLs?: Yes Does the patient have difficulty dressing or bathing?: Yes Independently performs ADLs?: No Communication: Independent Dressing (OT): Needs assistance Is this a change from baseline?: Pre-admission baseline Grooming: Needs assistance Is this a change from baseline?: Pre-admission baseline Feeding: Independent Bathing: Needs assistance Is this a change from baseline?: Pre-admission baseline Toileting: Needs assistance Is this a change from baseline?: Change from baseline, expected to last >3days In/Out Bed: Needs assistance Is this a change from baseline?:  Change from baseline, expected to last >3 days Walks in Home: Needs assistance Is this a change from baseline?: Change from baseline, expected to last >3 days Does the patient have difficulty walking or climbing stairs?: Yes Weakness of Legs: Both Weakness of Arms/Hands: None  Permission Sought/Granted                  Emotional Assessment              Admission diagnosis:  SVT (supraventricular tachycardia) (HCC) [I47.1] SOB (shortness of breath) [R06.02] Hypoxia [R09.02] Patient Active Problem List   Diagnosis Date Noted  . SVT (supraventricular tachycardia) ([place]) 11/16/2018  . Acute on chronic diastolic CHF (congestive heart failure) ([place]) 11/16/2018  . Elevated troponin 11/16/2018  . Leukocytosis 11/16/2018  . Atrial fibrillation, chronic ([place]) 11/16/2018  . Malignant pleural effusion   . 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 11/06/2018  . AKI (acute kidney injury) ([place]) 11/06/2018  . AF (paroxysmal atrial fibrillation) ([place]) 11/06/2018  . Pleural effusion 11/06/2018  . Bone metastases ([place]) 11/04/2018  . Metastatic breast cancer ([place])   . Pneumothorax   . Gram-positive bacteremia   . Mass of breast, right 10/15/2018  . Acute CHF (congestive heart failure) ([place]) 10/15/2018  . Shortness of breath 10/14/2018  . CKD (chronic kidney disease) stage 3, GFR 30-59 ml/min 02/06/2018  . HTN (hypertension) 12/01/2015   PCP:  [name], MD Pharmacy:   [facility], [place] - [address] [place] [address] Phone: [phone] Fax: [place], [place] [address] Phone: [phone] Fax: [phone]     Social Determinants of Health (SDOH) Interventions    Readmission Risk Interventions Readmission Risk Prevention Plan 10/23/2018  Transportation Screening Complete  PCP or Specialist  Appt within 5-7 Days Complete  Home Care Screening Complete  Medication Review (RN CM) Complete  Some recent data might be hidden

## 2018-11-17 NOTE — Evaluation (Signed)
Physical Therapy Evaluation Patient Details Name: Beverly Tucker MRN: [id number] DOB: 02-Jan-1941 Today's Date: 11/17/2018   History of Present Illness  Is a 78 year old female with past medical history of hypertension, GERD, anxiety, metastatic right breast cancer with recurrent malignant pleural effusion with Pleurx recently placed last admission 0000000,  diastolic heart failure, atrial fibrillation on Eliquis, SVT, pneumothorax, CKD 3 admitted 10/17 with 1 week of worsening shortness of breath, leg edema and palpitations.  Treated for SVT in ED and had recurrence overnight day of admission which resolved spontaneouly..  Clinical Impression  Patient presents with decreased mobility due to SOB, fatigue and general weakness.  She currently needs min to minguard A for hallway ambulation which is limited due to SOB.  She has son living next door and reports can rely on his family for support.  Feel she can benefit from skilled PT in the acute setting and follow up HHPT at d/c and possibly aide for help with bathing.      Follow Up Recommendations Home health PT;Supervision/Assistance - 24 hour(HH aide)    Equipment Recommendations  None recommended by PT    Recommendations for Other Services       Precautions / Restrictions Precautions Precautions: Fall Precaution Comments: pleurx, L side      Mobility  Bed Mobility               General bed mobility comments: up in chair with nursing assist prior to my entry  Transfers   Equipment used: None Transfers: Sit to/from Stand Sit to Stand: Min guard         General transfer comment: up from recliner with assist for lines/to steady  Ambulation/Gait Ambulation/Gait assistance: Min guard Gait Distance (Feet): 85 Feet Assistive device: None Gait Pattern/deviations: Step-to pattern;Step-through pattern;Decreased stride length     General Gait Details: slow and guarded due to SOB and some coughing, occasionally held wall rail,  HR max 114, SpO2 88-93% on 2L O2  Stairs            Wheelchair Mobility    Modified Rankin (Stroke Patients Only)       Balance Overall balance assessment: Needs assistance   Sitting balance-Leahy Scale: Good       Standing balance-Leahy Scale: Fair Standing balance comment: dynamic balance limited due to weakness                             Pertinent Vitals/Pain Pain Assessment: Faces Faces Pain Scale: Hurts little more Pain Location: L side, due to pleurx Pain Descriptors / Indicators: Grimacing Pain Intervention(s): Monitored during session;Repositioned    Home Living Family/patient expects to be discharged to:: Private residence Living Arrangements: Alone Available Help at Discharge: Family;Available PRN/intermittently Type of Home: House Home Access: Stairs to enter Entrance Stairs-Rails: Can reach both Entrance Stairs-Number of Steps: 3 Home Layout: One level Home Equipment: Walker - 2 wheels      Prior Function Level of Independence: Independent         Comments: son lives next door     Hand Dominance   Dominant Hand: Right    Extremity/Trunk Assessment   Upper Extremity Assessment Upper Extremity Assessment: Generalized weakness    Lower Extremity Assessment Lower Extremity Assessment: Generalized weakness    Cervical / Trunk Assessment Cervical / Trunk Assessment: Kyphotic  Communication   Communication: No difficulties  Cognition Arousal/Alertness: Awake/alert Behavior During Therapy: WFL for tasks assessed/performed;Anxious(reports feeling jittery) Overall Cognitive  Status: Within Functional Limits for tasks assessed                                        General Comments General comments (skin integrity, edema, etc.): SOB and coughing limiting ambulation on 2L O2    Exercises     Assessment/Plan    PT Assessment Patient needs continued PT services  PT Problem List Decreased  strength;Decreased mobility;Decreased activity tolerance;Cardiopulmonary status limiting activity;Decreased balance;Pain       PT Treatment Interventions DME instruction;Therapeutic activities;Gait training;Therapeutic exercise;Patient/family education;Stair training;Balance training;Functional mobility training    PT Goals (Current goals can be found in the Care Plan section)  Acute Rehab PT Goals Patient Stated Goal: to stop having SOB issues while at home PT Goal Formulation: With patient Time For Goal Achievement: 12/01/18 Potential to Achieve Goals: Fair    Frequency Min 3X/week   Barriers to discharge        Co-evaluation               AM-PAC PT "6 Clicks" Mobility  Outcome Measure Help needed turning from your back to your side while in a flat bed without using bedrails?: A Little Help needed moving from lying on your back to sitting on the side of a flat bed without using bedrails?: A Little Help needed moving to and from a bed to a chair (including a wheelchair)?: A Little Help needed standing up from a chair using your arms (e.g., wheelchair or bedside chair)?: A Little Help needed to walk in hospital room?: A Little Help needed climbing 3-5 steps with a railing? : A Lot 6 Click Score: 17    End of Session Equipment Utilized During Treatment: Oxygen Activity Tolerance: Patient limited by fatigue Patient left: in chair;with call bell/phone within reach   PT Visit Diagnosis: Unsteadiness on feet (R26.81);Muscle weakness (generalized) (M62.81)    Time: [id number] PT Time Calculation (min) (ACUTE ONLY): 20 min   Charges:   PT Evaluation $PT Eval Moderate Complexity: [place], [place] Acute Rehabilitation Services [phone] 11/17/2018   Reginia Naas 11/17/2018, 5:48 PM

## 2018-11-18 ENCOUNTER — Inpatient Hospital Stay (HOSPITAL_COMMUNITY): Payer: Medicare HMO

## 2018-11-18 ENCOUNTER — Other Ambulatory Visit: Payer: Medicare HMO

## 2018-11-18 ENCOUNTER — Ambulatory Visit: Payer: Medicare HMO | Admitting: Hematology

## 2018-11-18 ENCOUNTER — Other Ambulatory Visit: Payer: Self-pay | Admitting: Medical

## 2018-11-18 ENCOUNTER — Other Ambulatory Visit: Payer: Self-pay | Admitting: Hematology

## 2018-11-18 DIAGNOSIS — I471 Supraventricular tachycardia: Secondary | ICD-10-CM

## 2018-11-18 DIAGNOSIS — C50919 Malignant neoplasm of unspecified site of unspecified female breast: Secondary | ICD-10-CM

## 2018-11-18 DIAGNOSIS — E440 Moderate protein-calorie malnutrition: Secondary | ICD-10-CM | POA: Insufficient documentation

## 2018-11-18 DIAGNOSIS — R630 Anorexia: Secondary | ICD-10-CM

## 2018-11-18 DIAGNOSIS — I361 Nonrheumatic tricuspid (valve) insufficiency: Secondary | ICD-10-CM

## 2018-11-18 DIAGNOSIS — N1831 Chronic kidney disease, stage 3a: Secondary | ICD-10-CM

## 2018-11-18 DIAGNOSIS — I371 Nonrheumatic pulmonary valve insufficiency: Secondary | ICD-10-CM

## 2018-11-18 DIAGNOSIS — J90 Pleural effusion, not elsewhere classified: Secondary | ICD-10-CM

## 2018-11-18 DIAGNOSIS — I5033 Acute on chronic diastolic (congestive) heart failure: Secondary | ICD-10-CM

## 2018-11-18 LAB — BASIC METABOLIC PANEL
Anion gap: 10 (ref 5–15)
Anion gap: 14 (ref 5–15)
BUN: 15 mg/dL (ref 8–23)
BUN: 15 mg/dL (ref 8–23)
CO2: 36 mmol/L — ABNORMAL HIGH (ref 22–32)
CO2: 32 mmol/L (ref 22–32)
Calcium: 8.5 mg/dL — ABNORMAL LOW (ref 8.9–10.3)
Calcium: 9 mg/dL (ref 8.9–10.3)
Chloride: 95 mmol/L — ABNORMAL LOW (ref 98–111)
Chloride: 89 mmol/L — ABNORMAL LOW (ref 98–111)
Creatinine, Ser: 1.39 mg/dL — ABNORMAL HIGH (ref 0.44–1.00)
Creatinine, Ser: 1.48 mg/dL — ABNORMAL HIGH (ref 0.44–1.00)
GFR calc Af Amer: 42 mL/min — ABNORMAL LOW (ref >60)
GFR calc Af Amer: 39 mL/min — ABNORMAL LOW (ref >60)
GFR calc non Af Amer: 36 mL/min — ABNORMAL LOW (ref >60)
GFR calc non Af Amer: 33 mL/min — ABNORMAL LOW (ref >60)
Glucose, Bld: 121 mg/dL — ABNORMAL HIGH (ref 70–99)
Glucose, Bld: 162 mg/dL — ABNORMAL HIGH (ref 70–99)
Potassium: 4.1 mmol/L (ref 3.5–5.1)
Potassium: 4 mmol/L (ref 3.5–5.1)
Sodium: 141 mmol/L (ref 135–145)
Sodium: 135 mmol/L (ref 135–145)

## 2018-11-18 LAB — HEMOGLOBIN A1C
Hgb A1c MFr Bld: 6.1 % — ABNORMAL HIGH (ref 4.8–5.6)
Mean Plasma Glucose: 128 mg/dL

## 2018-11-18 LAB — CBC
HCT: 33 % — ABNORMAL LOW (ref 36.0–46.0)
Hemoglobin: 10.2 g/dL — ABNORMAL LOW (ref 12.0–15.0)
MCH: 28.7 pg (ref 26.0–34.0)
MCHC: 30.9 g/dL (ref 30.0–36.0)
MCV: 93 fL (ref 80.0–100.0)
Platelets: 374 10*3/uL (ref 150–400)
RBC: 3.55 MIL/uL — ABNORMAL LOW (ref 3.87–5.11)
RDW: 14.4 % (ref 11.5–15.5)
WBC: 9.7 10*3/uL (ref 4.0–10.5)
nRBC: 0 % (ref 0.0–0.2)

## 2018-11-18 LAB — MAGNESIUM
Magnesium: 1.9 mg/dL (ref 1.7–2.4)
Magnesium: 1.8 mg/dL (ref 1.7–2.4)

## 2018-11-18 LAB — ECHOCARDIOGRAM LIMITED
Height: 66 in
Weight: 2843.05 oz

## 2018-11-18 MED ORDER — FUROSEMIDE 10 MG/ML IJ SOLN
40.0000 mg | Freq: Once | INTRAMUSCULAR | Status: AC
  Administered 2018-11-18: 18:00:00 40 mg via INTRAVENOUS
  Filled 2018-11-18: qty 4

## 2018-11-18 MED ORDER — METOPROLOL TARTRATE 25 MG PO TABS
25.0000 mg | ORAL_TABLET | Freq: Two times a day (BID) | ORAL | Status: DC
  Filled 2018-11-18: qty 1

## 2018-11-18 MED ORDER — METOPROLOL TARTRATE 50 MG PO TABS
50.0000 mg | ORAL_TABLET | Freq: Two times a day (BID) | ORAL | Status: DC
  Administered 2018-11-18: 50 mg via ORAL
  Filled 2018-11-18 (×2): qty 1

## 2018-11-18 MED ORDER — METOPROLOL TARTRATE 25 MG PO TABS: 25.0000 mg | ORAL_TABLET | Freq: Two times a day (BID) | ORAL | Status: DC

## 2018-11-18 MED ORDER — METOPROLOL TARTRATE 5 MG/5ML IV SOLN
5.0000 mg | Freq: Once | INTRAVENOUS | Status: AC
  Administered 2018-11-18: 10:00:00 5 mg via INTRAVENOUS
  Filled 2018-11-18: qty 5

## 2018-11-18 MED ORDER — FUROSEMIDE 10 MG/ML IJ SOLN
40.0000 mg | Freq: Every day | INTRAMUSCULAR | Status: DC
  Administered 2018-11-18: 40 mg via INTRAVENOUS
  Filled 2018-11-18: qty 4

## 2018-11-18 MED ORDER — DOXYCYCLINE HYCLATE 100 MG PO TABS
100.0000 mg | ORAL_TABLET | Freq: Two times a day (BID) | ORAL | Status: AC
  Administered 2018-11-18 – 2018-11-21 (×8): 100 mg via ORAL
  Filled 2018-11-18 (×8): qty 1

## 2018-11-18 MED ORDER — IPRATROPIUM-ALBUTEROL 0.5-2.5 (3) MG/3ML IN SOLN
3.0000 mL | Freq: Four times a day (QID) | RESPIRATORY_TRACT | Status: DC
  Administered 2018-11-18 (×2): 3 mL via RESPIRATORY_TRACT
  Filled 2018-11-18 (×3): qty 3

## 2018-11-18 MED ORDER — IPRATROPIUM-ALBUTEROL 0.5-2.5 (3) MG/3ML IN SOLN
3.0000 mL | Freq: Two times a day (BID) | RESPIRATORY_TRACT | Status: DC
  Administered 2018-11-19: 07:00:00 3 mL via RESPIRATORY_TRACT
  Filled 2018-11-18: qty 3

## 2018-11-18 MED ORDER — FUROSEMIDE 10 MG/ML IJ SOLN: 20.0000 mg | Freq: Every day | INTRAMUSCULAR | Status: DC

## 2018-11-18 NOTE — TOC Progression Note (Addendum)
Transition of Care [facility]) - Progression Note    Patient Details  Name: Beverly Tucker MRN: [id number] Date of Birth: 1940-08-31  Transition of Care [facility]) CM/SW Contact  Zenon Mayo, RN Phone Number: 11/18/2018, 6:06 PM  Clinical Narrative:    Patient is from home, she is active with [facility] for [facility] services, will need to resume at dc for [facility], need to add HHPT/HHOT.  She had pluerx catheter prior to this admission. Heart Failure following, on eliquis pta. TOC team will continue to follow for dc needs.  Patient was concerned about ambulance bills, NCM informed her that if her insurance does not pay the ambulance bill then they will bill her, and if she has some problems with paying the hospital bill , NCM can give her a phone number to call so she could speak to someone about being set up on a payment plan.  Patient states she is not having a good day to day and to call her tomorrow.    Expected Discharge Plan: [place] Barriers to Discharge: Continued Medical Work up  Expected Discharge Plan and Services Expected Discharge Plan: [place]                                               Social Determinants of Health (SDOH) Interventions    Readmission Risk Interventions Readmission Risk Prevention Plan 10/23/2018  Transportation Screening Complete  PCP or Specialist Appt within 5-7 Days Complete  Home Care Screening Complete  Medication Review (RN CM) Complete  Some recent data might be hidden

## 2018-11-18 NOTE — Progress Notes (Signed)
Initial Nutrition Assessment  DOCUMENTATION CODES:   Non-severe (moderate) malnutrition in context of chronic illness  INTERVENTION:   - Ensure Enlive po BID, each supplement provides 350 kcal and 20 grams of protein  - Magic cup TID with meals, each supplement provides 290 kcal and 9 grams of protein  - Encourage adequate PO intake  NUTRITION DIAGNOSIS:   Moderate Malnutrition related to chronic illness (CHF, metastatic breast cancer, CKD stage III) as evidenced by mild muscle depletion, percent weight loss (7.5% weight loss in 1 month).  GOAL:   Patient will meet greater than or equal to 90% of their needs  MONITOR:   PO intake, Supplement acceptance, Labs, Weight trends, Skin, I & O's  REASON FOR ASSESSMENT:   Malnutrition Screening Tool    ASSESSMENT:   78 year old female who presented to the ED on 10/17 with SVT and SOB. PMH of CHF, atrial fibrillation, metastatic breast cancer with skin wound on chest, GERD, HTN, CKD stage III, recent pneumonia, recent thoracentesis, recurrent malignant pleural effusion.   Spoke with pt at bedside. Pt reports having a decreased appetite and decreased PO intake for anywhere from 2 weeks to 1 month. Pt reports that today she feels weak and had some nausea earlier after taking medications. Pt reports she was able to eat some toast with breakfast this morning. Noted an Ensure Enlive at bedside. Pt states that she plans to drink it later.  Pt shares that typically she has no issues with eating or with her appetite and eats several meals daily.  Pt has noticed that she has lost some weight recently. Pt reports her UBW as 199 lbs. Reviewed weight history in chart. Noted pt with a 6.5 kg weight loss over the last month. This is a 7.5% weight loss which is significant for timeframe.  Pt asking how she is supposed to make herself eat if she isn't hungry. Discussed importance of utilizing oral nutrition supplements to meet nutritional  needs.  Meal Completion: 20-50%  Medications reviewed and include: Ensure Enlive BID, Lasix  Labs reviewed: HDL 36, LDL 103  UOP: 1650 ml x 24 hours PleurX: 300 ml x 24 hours  NUTRITION - FOCUSED PHYSICAL EXAM:    Most Recent Value  Orbital Region  Mild depletion  Upper Arm Region  No depletion  Thoracic and Lumbar Region  No depletion  Buccal Region  No depletion  Temple Region  No depletion  Clavicle Bone Region  Mild depletion  Clavicle and Acromion Bone Region  Mild depletion  Scapular Bone Region  No depletion  Dorsal Hand  No depletion  Patellar Region  Mild depletion  Anterior Thigh Region  Mild depletion  Posterior Calf Region  Mild depletion  Edema (RD Assessment)  Mild [BLE]  Hair  Reviewed  Eyes  Reviewed  Mouth  Reviewed  Skin  Reviewed  Nails  Reviewed       Diet Order:   Diet Order            Diet Heart Room service appropriate? Yes; Fluid consistency: Thin; Fluid restriction: 1500 mL Fluid  Diet effective now              EDUCATION NEEDS:   Education needs have been addressed  Skin:  Skin Assessment: Skin Integrity Issues: Skin Integrity Issues: Other: wound to right breast  Last BM:  11/18/18  Height:   Ht Readings from Last 1 Encounters:  11/16/18 5\' 6"  (1.676 m)    Weight:   Wt Readings from  Last 1 Encounters:  11/18/18 80.6 kg    Ideal Body Weight:  59.1 kg  BMI:  Body mass index is 28.68 kg/m.  Estimated Nutritional Needs:   Kcal:  1600-1800  Protein:  80-100 grams  Fluid:  1.6-1.8 L    Gaynell Face, MS, RD, LDN Inpatient Clinical Dietitian Pager: [phone] Weekend/After Hours: [phone]

## 2018-11-18 NOTE — Evaluation (Signed)
Occupational Therapy Evaluation Patient Details Name: Beverly Tucker MRN: [id number] DOB: 1940-02-22 Today's Date: 11/18/2018    History of Present Illness Is a 78 year old female with past medical history of hypertension, GERD, anxiety, metastatic right breast cancer with recurrent malignant pleural effusion with Pleurx recently placed last admission 0000000,  diastolic heart failure, atrial fibrillation on Eliquis, SVT, pneumothorax, CKD 3 admitted 10/17 with 1 week of worsening shortness of breath, leg edema and palpitations.  Treated for SVT in ED and had recurrence overnight day of admission which resolved spontaneouly..   Clinical Impression   PTA patient independent. Admitted for above and limited by problem list below, including generalized weakness, decreased activity tolerance and impaired balance.  Patient completing UB ADLs with supervision, LB ADls with min assist to min guard, and transfers with min guard assist.  In room mobility completed to recliner with pt reaching out for UE support due to weakness.  Patient reports having support of family at discharge (lives alone, but they can provide support during ADLs, IADLs and mobility).  She will benefit from further OT services while admitted and after dc at [facility] level in order to return to independence and safety with ADLs, mobility and IADLs.     Follow Up Recommendations  Home health OT;Supervision - Intermittent    Equipment Recommendations  None recommended by OT    Recommendations for Other Services       Precautions / Restrictions Precautions Precautions: Fall Precaution Comments: pleurx, L side Restrictions Weight Bearing Restrictions: No      Mobility Bed Mobility               General bed mobility comments: seated EOB upon entry   Transfers Overall transfer level: Needs assistance Equipment used: None Transfers: Sit to/from Stand Sit to Stand: Min guard         General transfer comment: min guard  for safety/balance     Balance Overall balance assessment: Needs assistance   Sitting balance-Leahy Scale: Good     Standing balance support: No upper extremity supported;During functional activity;Single extremity supported Standing balance-Leahy Scale: Fair Standing balance comment: reaching for UE support dynamically                           ADL either performed or assessed with clinical judgement   ADL Overall ADL's : Needs assistance/impaired     Grooming: Set up;Sitting   Upper Body Bathing: Set up;Sitting   Lower Body Bathing: Minimal assistance;Sit to/from stand   Upper Body Dressing : Sitting;Set up;Supervision/safety   Lower Body Dressing: Minimal assistance;Sit to/from stand   Toilet Transfer: Min guard;Ambulation           Functional mobility during ADLs: Min guard General ADL Comments: pt limited by decreased activity tolerance and generalized weakness      Vision         Perception     Praxis      Pertinent Vitals/Pain Pain Assessment: Faces Faces Pain Scale: Hurts little more Pain Location: generalized, L side pleurx  Pain Descriptors / Indicators: Grimacing Pain Intervention(s): Monitored during session;Repositioned     Hand Dominance Right   Extremity/Trunk Assessment Upper Extremity Assessment Upper Extremity Assessment: Generalized weakness   Lower Extremity Assessment Lower Extremity Assessment: Defer to PT evaluation   Cervical / Trunk Assessment Cervical / Trunk Assessment: Kyphotic   Communication Communication Communication: No difficulties   Cognition Arousal/Alertness: Awake/alert Behavior During Therapy: WFL for tasks assessed/performed;Anxious Overall  Cognitive Status: Within Functional Limits for tasks assessed                                     General Comments  2 1/2 L supplemental O2 via [place], VSS     Exercises     Shoulder Instructions      Home Living Family/patient expects  to be discharged to:: Private residence Living Arrangements: Alone Available Help at Discharge: Family;Available PRN/intermittently Type of Home: House Home Access: Stairs to enter [employer] of Steps: 3 Entrance Stairs-Rails: Can reach both Home Layout: One level     Bathroom Shower/Tub: Teacher, early years/pre: Standard     Home Equipment: Environmental consultant - 2 wheels;Shower seat          Prior Functioning/Environment Level of Independence: Independent        Comments: independent ADLs, iADLs, driving; son lives next door        OT Problem List: Decreased strength;Decreased activity tolerance;Impaired balance (sitting and/or standing);Decreased knowledge of precautions;Cardiopulmonary status limiting activity      OT Treatment/Interventions: Self-care/ADL training;DME and/or AE instruction;Therapeutic activities;Patient/family education;Balance training;Energy conservation    OT Goals(Current goals can be found in the care plan section) Acute Rehab OT Goals Patient Stated Goal: to feel better and get home  OT Goal Formulation: With patient Time For Goal Achievement: 12/02/18 Potential to Achieve Goals: Good  OT Frequency: Min 2X/week   Barriers to D/C:            Co-evaluation              AM-PAC OT "6 Clicks" Daily Activity     Outcome Measure Help from another person eating meals?: None Help from another person taking care of personal grooming?: A Little Help from another person toileting, which includes using toliet, bedpan, or urinal?: A Little Help from another person bathing (including washing, rinsing, drying)?: A Little Help from another person to put on and taking off regular upper body clothing?: A Little Help from another person to put on and taking off regular lower body clothing?: A Little 6 Click Score: 19   End of Session Equipment Utilized During Treatment: Oxygen Nurse Communication: Mobility status  Activity Tolerance:  Patient limited by fatigue Patient left: in chair;with call bell/phone within reach  OT Visit Diagnosis: Other abnormalities of gait and mobility (R26.89);Muscle weakness (generalized) (M62.81)                Time: [id number] OT Time Calculation (min): 22 min Charges:  OT General Charges $OT Visit: 1 Visit OT Evaluation $OT Eval Moderate Complexity: [place], [place] Acute Rehabilitation Services Pager [phone] Office [phone]   Delight Stare 11/18/2018, 4:40 PM

## 2018-11-18 NOTE — Progress Notes (Signed)
PT Cancellation Note  Patient Details Name: Beverly Tucker MRN: [id number] DOB: [birth year]   Cancelled Treatment:    Reason Eval/Treat Not Completed: Patient declined, no reason specified Pt declined participating in PT at this time. PT will continue to follow acutely.    Earney Navy, PTA Acute Rehabilitation Services Pager: [phone] Office: [phone]   11/18/2018, 3:35 PM

## 2018-11-18 NOTE — Consult Note (Addendum)
Cardiology Consultation:   Patient ID: Beverly Tucker; [id number]; 1941/01/22   Admit date: 11/16/2018 Date of Consult: 11/18/2018  Primary Care Provider: [name], MD Primary Cardiologist: N/A Primary Electrophysiologist:  N/A   Patient Profile:   Beverly Tucker is a 78 y.o. female with a hx of HTN, GERD, anxiety, stage 4 breast ca on chemo, recurrent malignant pleural effusion, HFpEF, A.fib on Eliquis and CKD3 who is being seen today for the evaluation of SVT at the request of Dr.Segal.  History of Present Illness:   Ms. Tilles was examined and evaluated at bedside this AM. She was observed sitting comfortably in bedside chair. She states she currently feels 'okay but very anxious' regarding her prognosis and recurrent admissions. She denies any prior history of heart disease and mentions she was diagnosed with 'irregular heart rhythm' for the first time in 10/16/2018 when she was admitted for dyspnea and found to have metastatic breast cancer. She mentions that at no point did she have any chest pain, palpitations or dyspnea.  Chart review shows recent hospitalization on 10/14/2018 where she was found to have new left pleural effusion and new fungating mass on breast with thoracentesis shown exudate effusion with adenocarcinoma confirmed to be metastatic breast cancer. She was followed up with oncology for initiation of treatment and was discharged on metoprolol and eliquis. She was suggested to follow up with cardiology at discharge but has not yet had the opportunity.  Past Medical History:  Diagnosis Date  . Acute CHF (congestive heart failure) ([place]) 10/2018  . Atrial fibrillation ([place])   . Cancer (HCC)    Breast   . GERD (gastroesophageal reflux disease)   . Hypertension     Past Surgical History:  Procedure Laterality Date  . CHOLECYSTECTOMY    . IR PERC PLEURAL DRAIN W/INDWELL CATH W/IMG GUIDE  11/08/2018  . IR THORACENTESIS ASP PLEURAL SPACE W/IMG GUIDE  10/22/2018    Home Medications:  Prior to Admission medications   Medication Sig Start Date End Date Taking? Authorizing Provider  abemaciclib (VERZENIO) 50 MG tablet Take 1 tablet (50 mg total) by mouth 2 (two) times daily. Swallow tablets whole. Do not chew, crush, or split tablets before swallowing. 11/04/18  Yes Brunetta Genera, MD  ALPRAZolam Duanne Moron) 0.5 MG tablet Take 0.25-0.5 mg by mouth 2 (two) times daily as needed for anxiety. 05/31/17  Yes [provider]  apixaban (ELIQUIS) 5 MG TABS tablet Take 1 tablet (5 mg total) by mouth 2 (two) times daily. 10/23/18  Yes Jeanmarie Hubert, MD  ergocalciferol (VITAMIN D2) 1.25 MG (50000 UT) capsule Take 1 capsule (50,000 Units total) by mouth once a week. Patient taking differently: Take 50,000 Units by mouth every Monday.  11/04/18  Yes Brunetta Genera, MD  letrozole [facility]) 2.5 MG tablet Take 1 tablet (2.5 mg total) by mouth daily. 11/04/18  Yes Brunetta Genera, MD  metoprolol tartrate (LOPRESSOR) 25 MG tablet Take 1 tablet (25 mg total) by mouth 2 (two) times daily. 10/23/18  Yes Jeanmarie Hubert, MD   Inpatient Medications: Scheduled Meds: . abemaciclib  50 mg Oral BID  . apixaban  5 mg Oral BID  . doxycycline  100 mg Oral Q12H  . feeding supplement (ENSURE ENLIVE)  237 mL Oral BID BM  . furosemide  40 mg Intravenous Daily  . [START ON 11/19/2018] ipratropium-albuterol  3 mL Nebulization BID  . letrozole  2.5 mg Oral Daily  . metoprolol tartrate  5 mg Intravenous Once  .  metoprolol tartrate  50 mg Oral BID  . sodium chloride flush  3 mL Intravenous Q12H  . Vitamin D (Ergocalciferol)  50,000 Units Oral Q Mon   Continuous Infusions: . sodium chloride     PRN Meds: sodium chloride, acetaminophen, albuterol, ALPRAZolam, dextromethorphan-guaiFENesin, hydrALAZINE, loperamide, nitroGLYCERIN, ondansetron (ZOFRAN) IV, oxyCODONE-acetaminophen, sodium chloride flush  Allergies:   No Known Allergies  Social History:   Social History    Socioeconomic History  . Marital status: Widowed    Spouse name: Not on file  . Number of children: Not on file  . Years of education: Not on file  . Highest education level: Not on file  Occupational History  . Not on file  Social Needs  . Financial resource strain: Not on file  . Food insecurity    Worry: Not on file    Inability: Not on file  . Transportation needs    Medical: Not on file    Non-medical: Not on file  Tobacco Use  . Smoking status: Never Smoker  . Smokeless tobacco: Never Used  Substance and Sexual Activity  . Alcohol use: Never    Frequency: Never  . Drug use: Never  . Sexual activity: Not on file  Lifestyle  . Physical activity    Days per week: Not on file    Minutes per session: Not on file  . Stress: Not on file  Relationships  . Social Herbalist on phone: Not on file    Gets together: Not on file    Attends religious service: Not on file    Active member of club or organization: Not on file    Attends meetings of clubs or organizations: Not on file    Relationship status: Not on file  . Intimate partner violence    Fear of current or ex partner: Not on file    Emotionally abused: Not on file    Physically abused: Not on file    Forced sexual activity: Not on file  Other Topics Concern  . Not on file  Social History Narrative  . Not on file    Family History:   No 1st degree relative with cardiac disease  Family History  Problem Relation Age of Onset  . Diabetes Mellitus II Other        son    ROS:  Please see the history of present illness.  Review of Systems  Constitution: Negative for chills and fever.  Cardiovascular: Positive for dyspnea on exertion. Negative for chest pain and irregular heartbeat.  Respiratory: Positive for cough and shortness of breath.   Gastrointestinal: Negative for constipation, diarrhea, nausea and vomiting.   All other ROS reviewed and negative.     Physical Exam/Data:   Vitals:    11/18/18 0800 11/18/18 1433 11/18/18 1913 11/18/18 1938  BP:    134/67  Pulse: 96 98  74  Resp: (!) 22 19  14   Temp:    98.2 F (36.8 C)  TempSrc:    Oral  SpO2: 97% 97% 96% 96%  Weight:      Height:        Intake/Output Summary (Last 24 hours) at 11/18/2018 2128 Last data filed at 11/18/2018 1500 Gross per 24 hour  Intake 120 ml  Output 1400 ml  Net -1280 ml   Filed Weights   11/17/18 0008 11/17/18 0341 11/18/18 0419  Weight: 82.4 kg 82.5 kg 80.6 kg   Body mass index is 28.68 kg/m.  Gen: Well-developed, chronically ill-appearing, NAD HEENT: NCAT head, hearing intact, EOMI, MMM Neck: supple, ROM intact, no JVD CV: RRR, S1, S2 normal, No rubs, no murmurs, no gallops, pacer pads in place Pulm: Bilateral rales L>R, chest tube intact Abd: Soft, BS+, NTND, No rebound, no guarding Extm: ROM intact, Peripheral pulses intact, trace ankle pitting edema Skin: Dry, Warm, normal turgor Neuro: AAOx3  EKG:  The EKG was personally reviewed and demonstrates:  Normal sinus, normal axis, no ST changes  Telemetry:  Telemetry was personally reviewed and demonstrates: 15 minute Episode of SVT w/ HR >160 on 11/17/18 1am, since then normal sinus with heavy PVC burden. Intermittent tachycardia.   Relevant CV Studies:  TTE 11/18/18 IMPRESSIONS  1. Left ventricular ejection fraction, by visual estimation, is 55 to 60%. The left ventricle has normal function. Normal left ventricular size. There is no left ventricular hypertrophy.  2. Left ventricular diastolic Doppler parameters are consistent with impaired relaxation pattern of LV diastolic filling.  3. Global right ventricle has normal systolic function.The right ventricular size is normal. No increase in right ventricular wall thickness.  4. Left atrial size was normal.  5. Right atrial size was normal.  6. Moderate thickening of the mitral valve leaflet(s).  7. Severe mitral annular calcification.  8. The mitral valve is normal in  structure. Trace mitral valve regurgitation.  9. The tricuspid valve is grossly normal. Tricuspid valve regurgitation is mild. 10. The aortic valve is tricuspid Aortic valve regurgitation is mild by color flow Doppler. Mild to moderate aortic valve sclerosis/calcification without any evidence of aortic stenosis. 11. The pulmonic valve was grossly normal. Pulmonic valve regurgitation is mild by color flow Doppler  Laboratory Data:  Chemistry Recent Labs  Lab 11/17/18 0031 11/18/18 0322 11/18/18 1426  NA 141 141 135  K 3.9 4.1 4.0  CL 99 95* 89*  CO2 31 36* 32  GLUCOSE 114* 121* 162*  BUN 12 15 15   CREATININE 1.19* 1.39* 1.48*  CALCIUM 8.8* 8.5* 9.0  GFRNONAA 43* 36* 33*  GFRAA 50* 42* 39*  ANIONGAP 11 10 14     Recent Labs  Lab 11/13/18 0935 11/16/18 1840  PROT 6.5 6.9  ALBUMIN 2.7* 3.0*  AST 18 37  ALT 16 25  ALKPHOS 79 89  BILITOT 0.3 0.7   Hematology Recent Labs  Lab 11/16/18 1840 11/17/18 0031 11/18/18 0322  WBC 13.7* 11.9* 9.7  RBC 3.92 3.41* 3.55*  HGB 11.3* 9.9* 10.2*  HCT 37.5 32.3* 33.0*  MCV 95.7 94.7 93.0  MCH 28.8 29.0 28.7  MCHC 30.1 30.7 30.9  RDW 14.7 14.6 14.4  PLT 462* 409* 374   Cardiac EnzymesNo results for input(s): TROPONINI in the last 168 hours. No results for input(s): TROPIPOC in the last 168 hours.  BNP Recent Labs  Lab 11/16/18 1840  BNP 183.7*    DDimer No results for input(s): DDIMER in the last 168 hours.  Radiology/Studies:  Dg Chest Port 1 View  Result Date: 11/18/2018 CLINICAL DATA:  Shortness of breath.  Weakness.  Pleural effusion. EXAM: PORTABLE CHEST 1 VIEW COMPARISON:  11/17/2018. FINDINGS: Left PleurX catheter in stable position. Cardiomegaly with pulmonary venous congestion again noted without interim change. Low lung volumes. Bibasilar infiltrates/edema again noted. Small unchanged bilateral pleural effusions again noted. No pneumothorax. IMPRESSION: 1.  Left PleurX catheter in stable position.  No pneumothorax.  2. Cardiomegaly with pulmonary venous congestion again noted without interim change. 3. Low lung volumes. Bibasilar and infiltrates/edema again noted. Small unchanged bilateral pleural  effusions again noted. Electronically Signed   By: Marcello Moores  Register   On: 11/18/2018 06:44   Dg Chest Port 1 View  Result Date: 11/17/2018 CLINICAL DATA:  Shortness of breath. EXAM: PORTABLE CHEST 1 VIEW COMPARISON:  Radiograph yesterday. CT 10/16/2018 FINDINGS: Left PleurX catheter remains in place. Low lung volumes with bibasilar volume loss. Hazy bibasilar opacities consistent with pleural effusions adjacent atelectasis/consolidation. Cardiomegaly, not well assessed due to adjacent airspace disease. Worsening vascular congestion. No visualized pneumothorax. IMPRESSION: 1. Worsening vascular congestion. 2. Bibasilar opacities consistent with pleural effusions and adjacent atelectasis/consolidation. 3. Left PleurX catheter remains in place. No visualized pneumothorax. 4. Cardiomegaly. Electronically Signed   By: Keith Rake M.D.   On: 11/17/2018 02:19   Dg Chest Port 1 View  Result Date: 11/16/2018 CLINICAL DATA:  Shortness of breath EXAM: PORTABLE CHEST 1 VIEW COMPARISON:  November 09, 2018 FINDINGS: There is mild cardiomegaly. Again noted is a left-sided pleural catheter. Aortic knob calcifications. There is a small left pleural effusion with patchy airspace opacity at the left lung base which is new. There is a trace right pleural effusion with hazy airspace opacity at the right lung base. IMPRESSION: Small bilateral pleural effusions, left greater than right with new hazy airspace opacities at both lung bases which could be due to infectious etiology, layering effusion, and/or edema. Cardiomegaly Left-sided pleural catheter. Electronically Signed   By: Prudencio Pair M.D.   On: 11/16/2018 19:15    Assessment and Plan:   Paroxysmal Atrial Fibrillation with RVR Recurrent SVT Hx of A.fib w/ RVR on metoprolol and  eliquis. CHADSVASC score of 5 due to age, htn, chf, gender. Presents with symptomatic SVT w/ HR >160. Treated w/ adenosine with resolution. K 4.7 on admission. TSH 2.545. Likely driven by pulmonary disease due to vascular congestion and pleural effusion. May also have anxiety component as patient is very worried about her prognosis. Currently in normal sinus with HR <100. On chemo (abemaciclib) for breast ca, putting her at risk for hyperkalemia due to tumor lysis. Need to closely monitor electrolytes - Increase metoprolol to 50mg  BID - Telemetry - Monitor K, Mag, treat if hyper- replete if hypo - C/w Eliquis - Will need cardiac monitoring at discharge. If recurrent episodes despite medical therapy, will need ICD  Acute on chronic diastolic heart failure Echocardiogram w/ EF 55-60%. BNP on admission 183.7. 3L output on IV lasix 40mg . Mildly hypervolemic on exam. - C/w gentle diuresis - Daily weight, I&Os  For questions or updates, please contact [place] Please consult [email or web address] for contact info under Cardiology/STEMI.   Signed, Gilberto Better, MD PGY-2, [place]  IM Pager: [phone] 11/18/2018 9:28 PM  Attending attestation to follow  The patient was seen, examined and discussed with the resident Mosetta Anis, MD   and I agree with the above.   78 y.o. female with a hx of HTN, GERD, anxiety, stage 4 breast ca on chemo, recurrent malignant pleural effusion, HFpEF, A.fib on Eliquis and CKD3, she was recently hospitalization on 10/14/2018 where she was found to have new left pleural effusion and new fungating mass on breast with thoracentesis shown exudate effusion with adenocarcinoma confirmed to be metastatic breast cancer. She was followed up with oncology for initiation of treatment and was discharged on metoprolol and eliquis. She was suggested to follow up with cardiology at discharge but has not yet had the opportunity.  She was admitted with SVT with heart rate 160 bpm that  was terminated with adenosine.  The patient  was in heart failure possibly precipitated by SVT but also contribution of pleural effusion secondary to breast cancer metastases.  She has diuresed well with IV Lasix, I would continue 1 more day, she remains in sinus tachycardia and hypertensive, I would increase metoprolol to 50 mg p.o. twice daily.  She was not on any diuretic prior to admission, I will discharge on 40 mg p.o. daily.  We will follow.  Ena Dawley, MD 11/18/2018

## 2018-11-18 NOTE — Progress Notes (Signed)
  Echocardiogram 2D Echocardiogram has been performed.  Beverly Tucker 11/18/2018, 11:01 AM

## 2018-11-18 NOTE — Progress Notes (Signed)
Chaplain received referral for Advanced Directive.  Upon talking with Ms. Kaytlynne, she did not want an AD but was in need of spiritual care.  Ms. Lenor shared that she was in a lot of pain and that she felt very weak.  Chaplain provided spiritual presence and prayer to Ms. Naevia.  Ms. Keymoni also shared some worry and concern over paying ambulance bills because it seems like when she gets home her condition always worsens to the point of having to call the ambulance.  Ms. Loretto expressed the pressure of dealing with healing and recovery as well as worrying about finances.  Ms. Shanicia noted, "I am in the hole." Chaplain expressed to Ms. Brelyn that she would try to see if there are any resources available to her through consulting other avenues.  Ms. Zayda seemed to feel a sense of peace in being able to possibly receive some help. Ms. Zakeria also noted in the conversation that she did not know what was causing her so much pain.  Chaplain suggested that Ms. Kajuana talk with her nurse and doctors, asking them the questions she may need answers too about her body.   Chaplain spoke with nurse about inputting case management or social work consult for resources concerning her surmounting medical bills.

## 2018-11-18 NOTE — Care Management Important Message (Signed)
Important Message  Patient Details  Name: Beverly Tucker MRN: [id number] Date of Birth: 22-Jul-1940   Medicare Important Message Given:  Yes     Memory Argue 11/18/2018, 3:45 PM   PATIENT HAS SIGNED IM

## 2018-11-18 NOTE — Progress Notes (Signed)
PROGRESS NOTE    Beverly Tucker   [id number] DOB: [birth year] DOA: 11/16/2018  Admitted from: Home.  Typically independent for most of the ADL PCP: [name], MD   Hospital Summary  Is a 78 year old female with past medical history of hypertension, GERD, anxiety, metastatic right breast cancer recently started on abemaciclib on 10/12 and on letrozole (follows with Dr. Roney Marion appointment 10/5), recurrent malignant pleural effusion with Pleurx, diastolic heart failure, atrial fibrillation on Eliquis, SVT, pneumothorax, CKD 3 who presented to the ED on 10/17 with 1 week of worsening shortness of breath, leg edema and palpitations.  In the ED patient was found to have SVT with heart rate up to 180s and was given 6 mg of adenosine with conversion to NSR.  She was found to have presentation troponin 32, WBC 13.7, BNP 183, lactic acid 1.8, - Covid.  Chest x-ray showed small bilateral pleural effusions and somewhat new hazy opacity and started on empiric azithromycin.  She was admitted for acute on chronic diastolic heart failure exacerbation received IV diuresis.  Overnight 10/17-10/18 patient had a recurrence of SVT which continuously converted to NSR without any medical intervention.  A & P   Principal Problem:   SVT (supraventricular tachycardia) (HCC) Active Problems:   CKD (chronic kidney disease) stage 3, GFR 30-59 ml/min   HTN (hypertension)   Metastatic breast cancer (HCC)   Pleural effusion   Acute on chronic diastolic CHF (congestive heart failure) (HCC)   Elevated troponin   Leukocytosis   Atrial fibrillation, chronic (HCC)   Acute Hypoxic Respiratory Failure secondary to decompensated heart failure on 3 L nasal canula, not on home O2. With wheeze and pleural effusions.   Dueneb scheduled with PRN albuterol  Continue to diurese  SVT status post adenosine 6 mg x 1 in ED with conversion to NSR and again with brief recurrence of SVT overnight which spontaneously converted  to NSR.  Likely triggered by CHF exacerbation . Telemetry  . Consider cardiology consult if recurs after diuresis  Atrial fibrillation with RVR CHA2DS2-VASc: 5 on Eliquis. afib with RVR on tele and converted to NSR with frequent PVCs while at bedside. Did not get her beta blocker yet. . Lopressor 5 mg IV . Continue BID beta blocker . Continue Eliquis . Monitor electrolytes and replete as necessary  Acute on chronic diastolic heart failure in setting of Afib with RVR last echo (10/15/18) EF 65 to 70%.  Diuresed 1.65 L overnight, Wt 82.1 ->80.6 kg today, still volume overloaded but with creatinine bump 1.19 -> 1.39 likely cardio renal. . Continue beta blocker, consider cardizem drip if no improvement and cardiology consult . Echo . Continue Lasix 40 mg daily . Continue heart failure protocol with daily weights, strict I's/O, low-salt diet and fluid restriction  Elevated troponin troponin 32 which peaked at 49.  Suspect demand ischemia in setting of heart failure and SVT.  EKG at baseline . Continue telemetry  Elevated creatinine on CKD 3a Cr 1.19->1.39, suspect cardio renal.  Continue to diurese  Continue beta blocker  Hypertension stable . Metoprolol . As needed hydralazine p.o. . Continue Lasix  Metastatic breast cancer patient of Dr. [name], Oncology. On letrozole and abemaciclib (recently started abemaciclib 10/12)  Notified Dr. Grier Mitts nurse that the patient is hospitalized  Recurrent malignant pleural effusion Pleurx drain placed 10/9. Drained 200 cc overnight. CXR this morning with pulmonary venous congestion without interim change, low lung volumes and bibasilar infiltrate and small pleural effusions noted.  . Repeat  chest x-ray in a.m.  Leukocytosis 13.7->11.9->9.7. Afebrile. On Azythromycin for empiric coverage.  Shortness of breath and cough is likely due to acute on chronic heart failure, although she does have a persistent infiltrate on xray and not feeling well.  .  Switch Azithromycin to Doxycycline for empiric coverage due to patient having an arrhythmia to prevent QT prolongation   DVT prophylaxis: Eliquis   Code Status: Full Code  Diet: Heart healthy Disposition Plan: Pending medical stability  Consultants  . Oncology  Procedures  . Adenosine 6 mg x 1 for SVT  Antibiotics  Day 2/5 total antibiotics azithromycin changed to Doxycycline 10/19    Subjective   Patient seen at bedside sitting upright in clear discomfort. States she feels weak and maybe worse than yesterday. Admits to shortness of breath and leg swelling. Denies chest pain or palpitations, nausea, vomiting, urinary or bowel complaints.     Objective   Vitals:   11/17/18 1517 11/17/18 1932 11/17/18 2328 11/18/18 0419  BP: (!) 122/57 (!) 153/80 128/66 (!) 148/70  Pulse: 72 (!) 101 79 85  Resp: 20 (!) 33 (!) 24 (!) 24  Temp: 98.2 F (36.8 C) 98.9 F (37.2 C) 98.4 F (36.9 C) 97.8 F (36.6 C)  TempSrc: Oral Oral Oral Oral  SpO2: 100% 96% 100% 99%  Weight:    80.6 kg  Height:        Intake/Output Summary (Last 24 hours) at 11/18/2018 0703 Last data filed at 11/18/2018 0300 Gross per 24 hour  Intake 123 ml  Output 1950 ml  Net -1827 ml   Filed Weights   11/17/18 0008 11/17/18 0341 11/18/18 0419  Weight: 82.4 kg 82.5 kg 80.6 kg    Examination:  Physical Exam Vitals signs and nursing note reviewed.  Constitutional:      Appearance: Normal appearance. She is ill-appearing.  HENT:     Head: Normocephalic and atraumatic.     Nose: Nose normal.     Mouth/Throat:     Mouth: Mucous membranes are moist.  Eyes:     Extraocular Movements: Extraocular movements intact.  Neck:     Musculoskeletal: Normal range of motion. No neck rigidity.  Cardiovascular:     Rate and Rhythm: Regular rhythm. Tachycardia present.     Comments: Afib with RVR converted to NSR with frequent PVCs at bedside. HR 90s-low 100s Pulmonary:     Effort: Tachypnea present.     Comments:  Bilateral wheeze Bibasilar rales Abdominal:     General: Abdomen is flat.     Palpations: Abdomen is soft.  Musculoskeletal: Normal range of motion.        General: No swelling.     Comments: 1+ bilateral lower extremity pitting edema  Neurological:     General: No focal deficit present.     Mental Status: She is alert. Mental status is at baseline.  Psychiatric:        Mood and Affect: Mood normal.     Data Reviewed: I have personally reviewed following labs and imaging studies  CBC: Recent Labs  Lab 11/13/18 0935 11/16/18 1840 11/17/18 0031 11/18/18 0322  WBC 10.4 13.7* 11.9* 9.7  NEUTROABS 8.2*  --   --   --   HGB 10.1* 11.3* 9.9* 10.2*  HCT 33.1* 37.5 32.3* 33.0*  MCV 93.0 95.7 94.7 93.0  PLT 317 462* 409* [id number]   Basic Metabolic Panel: Recent Labs  Lab 11/13/18 0935 11/16/18 1840 11/17/18 0031 11/18/18 0322  NA 140 138 141 141  K 4.0 4.7 3.9 4.1  CL 98 98 99 95*  CO2 33* 27 31 36*  GLUCOSE 112* 145* 114* 121*  BUN 19 15 12 15   CREATININE 1.28* 1.29* 1.19* 1.39*  CALCIUM 9.0 9.1 8.8* 8.5*  MG  --  2.3 2.0 1.9   GFR: Estimated Creatinine Clearance: 35.1 mL/min (A) (by C-G formula based on SCr of 1.39 mg/dL (H)). Liver Function Tests: Recent Labs  Lab 11/13/18 0935 11/16/18 1840  AST 18 37  ALT 16 25  ALKPHOS 79 89  BILITOT 0.3 0.7  PROT 6.5 6.9  ALBUMIN 2.7* 3.0*   No results for input(s): LIPASE, AMYLASE in the last 168 hours. No results for input(s): AMMONIA in the last 168 hours. Coagulation Profile: No results for input(s): INR, PROTIME in the last 168 hours. Cardiac Enzymes: No results for input(s): CKTOTAL, CKMB, CKMBINDEX, TROPONINI in the last 168 hours. BNP (last 3 results) No results for input(s): PROBNP in the last 8760 hours. HbA1C: No results for input(s): HGBA1C in the last 72 hours. CBG: No results for input(s): GLUCAP in the last 168 hours. Lipid Profile: Recent Labs    11/17/18 0031  CHOL 165  HDL 36*  LDLCALC 103*   TRIG 132  CHOLHDL 4.6   Thyroid Function Tests: Recent Labs    11/16/18 1840  TSH 2.545   Anemia Panel: No results for input(s): VITAMINB12, FOLATE, FERRITIN, TIBC, IRON, RETICCTPCT in the last 72 hours. Sepsis Labs: Recent Labs  Lab 11/16/18 1840 11/17/18 0030  LATICACIDVEN 1.8 1.6    Recent Results (from the past 240 hour(s))  SARS Coronavirus 2 by RT PCR (hospital order, performed in [facility] hospital lab) Nasopharyngeal Nasopharyngeal Swab     Status: None   Collection Time: 11/16/18  7:37 PM   Specimen: Nasopharyngeal Swab  Result Value Ref Range Status   SARS Coronavirus 2 NEGATIVE NEGATIVE Final    Comment: (NOTE) If result is NEGATIVE SARS-CoV-2 target nucleic acids are NOT DETECTED. The SARS-CoV-2 RNA is generally detectable in upper and lower  respiratory specimens during the acute phase of infection. The lowest  concentration of SARS-CoV-2 viral copies this assay can detect is 250  copies / mL. A negative result does not preclude SARS-CoV-2 infection  and should not be used as the sole basis for treatment or other  patient management decisions.  A negative result may occur with  improper specimen collection / handling, submission of specimen other  than nasopharyngeal swab, presence of viral mutation(s) within the  areas targeted by this assay, and inadequate number of viral copies  (<250 copies / mL). A negative result must be combined with clinical  observations, patient history, and epidemiological information. If result is POSITIVE SARS-CoV-2 target nucleic acids are DETECTED. The SARS-CoV-2 RNA is generally detectable in upper and lower  respiratory specimens dur ing the acute phase of infection.  Positive  results are indicative of active infection with SARS-CoV-2.  Clinical  correlation with patient history and other diagnostic information is  necessary to determine patient infection status.  Positive results do  not rule out bacterial infection or  co-infection with other viruses. If result is PRESUMPTIVE POSTIVE SARS-CoV-2 nucleic acids MAY BE PRESENT.   A presumptive positive result was obtained on the submitted specimen  and confirmed on repeat testing.  While 2019 novel coronavirus  (SARS-CoV-2) nucleic acids may be present in the submitted sample  additional confirmatory testing may be necessary for epidemiological  and / or clinical management purposes  to differentiate between  SARS-CoV-2 and other Sarbecovirus currently known to infect humans.  If clinically indicated additional testing with an alternate test  methodology [phone]) is advised. The SARS-CoV-2 RNA is generally  detectable in upper and lower respiratory sp ecimens during the acute  phase of infection. The expected result is Negative. Fact Sheet for Patients:  StrictlyIdeas.no Fact Sheet for Healthcare Providers: BankingDealers.co.za This test is not yet approved or cleared by the [place] FDA and has been authorized for detection and/or diagnosis of SARS-CoV-2 by FDA under an Emergency Use Authorization (EUA).  This EUA will remain in effect (meaning this test can be used) for the duration of the COVID-19 declaration under Section 564(b)(1) of the Act, 21 U.S.C. section 360bbb-3(b)(1), unless the authorization is terminated or revoked sooner. Performed at [facility], [place] [address], [place], [place] [address]   MRSA PCR Screening     Status: None   Collection Time: 11/17/18 12:15 AM   Specimen: Nasal Mucosa; Nasopharyngeal  Result Value Ref Range Status   MRSA by PCR NEGATIVE NEGATIVE Final    Comment:        The GeneXpert MRSA Assay (FDA approved for NASAL specimens only), is one component of a comprehensive MRSA colonization surveillance program. It is not intended to diagnose MRSA infection nor to guide or monitor treatment for MRSA infections. Performed at [facility],  [place] [address], [place], [place] [address]   Culture, blood (Routine X 2) w Reflex to ID Panel     Status: None (Preliminary result)   Collection Time: 11/17/18 12:31 AM   Specimen: BLOOD  Result Value Ref Range Status   Specimen Description BLOOD LEFT ANTECUBITAL  Final   Special Requests   Final    BOTTLES DRAWN AEROBIC AND ANAEROBIC Blood Culture adequate volume   Culture   Final    NO GROWTH < 24 HOURS Performed at [facility], [place] [address], [place], [place] [address]    Report Status PENDING  Incomplete  Culture, blood (Routine X 2) w Reflex to ID Panel     Status: None (Preliminary result)   Collection Time: 11/17/18 12:31 AM   Specimen: BLOOD  Result Value Ref Range Status   Specimen Description BLOOD LEFT ARM  Final   Special Requests   Final    BOTTLES DRAWN AEROBIC AND ANAEROBIC Blood Culture results may not be optimal due to an inadequate volume of blood received in culture bottles   Culture   Final    NO GROWTH < 24 HOURS Performed at [facility], [place] [address] [place], [place] [address]    Report Status PENDING  Incomplete         Radiology Studies: Dg Chest Port 1 View  Result Date: 11/18/2018 CLINICAL DATA:  Shortness of breath.  Weakness.  Pleural effusion. EXAM: PORTABLE CHEST 1 VIEW COMPARISON:  11/17/2018. FINDINGS: Left PleurX catheter in stable position. Cardiomegaly with pulmonary venous congestion again noted without interim change. Low lung volumes. Bibasilar infiltrates/edema again noted. Small unchanged bilateral pleural effusions again noted. No pneumothorax. IMPRESSION: 1.  Left PleurX catheter in stable position.  No pneumothorax. 2. Cardiomegaly with pulmonary venous congestion again noted without interim change. 3. Low lung volumes. Bibasilar and infiltrates/edema again noted. Small unchanged bilateral pleural effusions again noted. Electronically Signed   By: Marcello Moores  Register   On: 11/18/2018 06:44   Dg Chest Port 1 View  Result  Date: 11/17/2018 CLINICAL DATA:  Shortness  of breath. EXAM: PORTABLE CHEST 1 VIEW COMPARISON:  Radiograph yesterday. CT 10/16/2018 FINDINGS: Left PleurX catheter remains in place. Low lung volumes with bibasilar volume loss. Hazy bibasilar opacities consistent with pleural effusions adjacent atelectasis/consolidation. Cardiomegaly, not well assessed due to adjacent airspace disease. Worsening vascular congestion. No visualized pneumothorax. IMPRESSION: 1. Worsening vascular congestion. 2. Bibasilar opacities consistent with pleural effusions and adjacent atelectasis/consolidation. 3. Left PleurX catheter remains in place. No visualized pneumothorax. 4. Cardiomegaly. Electronically Signed   By: Keith Rake M.D.   On: 11/17/2018 02:19   Dg Chest Port 1 View  Result Date: 11/16/2018 CLINICAL DATA:  Shortness of breath EXAM: PORTABLE CHEST 1 VIEW COMPARISON:  November 09, 2018 FINDINGS: There is mild cardiomegaly. Again noted is a left-sided pleural catheter. Aortic knob calcifications. There is a small left pleural effusion with patchy airspace opacity at the left lung base which is new. There is a trace right pleural effusion with hazy airspace opacity at the right lung base. IMPRESSION: Small bilateral pleural effusions, left greater than right with new hazy airspace opacities at both lung bases which could be due to infectious etiology, layering effusion, and/or edema. Cardiomegaly Left-sided pleural catheter. Electronically Signed   By: Prudencio Pair M.D.   On: 11/16/2018 19:15        Scheduled Meds: . abemaciclib  50 mg Oral BID  . apixaban  5 mg Oral BID  . azithromycin  250 mg Oral Daily  . feeding supplement (ENSURE ENLIVE)  237 mL Oral BID BM  . letrozole  2.5 mg Oral Daily  . metoprolol tartrate  5 mg Intravenous Once  . metoprolol tartrate  25 mg Oral BID  . sodium chloride flush  3 mL Intravenous Q12H  . Vitamin D (Ergocalciferol)  50,000 Units Oral Q Mon   Continuous Infusions:  . sodium chloride       LOS: 2 days    Time spent: 40 minutes With over half the time coordinating the patient's care    Harold Hedge, DO [facility] Pager [phone]  If 7PM-7AM, please contact night-coverage [email or web address] Password [email or web address] 11/18/2018, 7:03 AM

## 2018-11-18 NOTE — Progress Notes (Signed)
HEMATOLOGY-ONCOLOGY PROGRESS NOTE  SUBJECTIVE: Beverly Tucker was admitted with SVT. HR was in the 180's on admission. Converted with adenosine. SVT thought to be triggered by CHF exacerbation.  Reports ongoing fatigue. No palpitations.  States lower extremity edema is improving.  Heart rate was noted to be in the mid 80s up to the upper 90s during my visit.  Nurse reports that she sometimes gets up to the 120s to 130s with coughing or movement.  Pleurx catheter is being drained every other day.  200 cc removed yesterday.  No shortness of breath.  The patient is worried that due to her frequent hospital admissions that we are "losing ground" in terms of treatment for her breast cancer.  REVIEW OF SYSTEMS:   As noted in the HPI.  I have reviewed the past medical history, past surgical history, social history and family history with the patient and they are unchanged from previous note.   PHYSICAL EXAMINATION:  Vitals:   11/18/18 0800 11/18/18 1433  BP:    Pulse: 96 98  Resp: (!) 22 19  Temp:    SpO2: 97% 97%   Filed Weights   11/17/18 0008 11/17/18 0341 11/18/18 0419  Weight: 181 lb 10.5 oz (82.4 kg) 181 lb 14.1 oz (82.5 kg) 177 lb 11.1 oz (80.6 kg)    Intake/Output from previous day: 10/18 0701 - 10/19 0700 In: 123 [P.O.:120; I.V.:3] Out: 1950 [Urine:1650; Drains:300]  GENERAL:alert, no distress and comfortable LYMPH:  no palpable lymphadenopathy in the cervical, axillary or inguinal LUNGS: Diminished in the left base. HEART: regular rate & rhythm and no murmurs and 1+ bilateral lower extremity edema ABDOMEN:abdomen soft, non-tender and normal bowel sounds Musculoskeletal:no cyanosis of digits and no clubbing  NEURO: alert & oriented x 3 with fluent speech, no focal motor/sensory deficits  LABORATORY DATA:  I have reviewed the data as listed CMP Latest Ref Rng & Units 11/18/2018 11/17/2018 11/16/2018  Glucose 70 - 99 mg/dL 121(H) 114(H) 145(H)  BUN 8 - 23 mg/dL 15 12 15    Creatinine 0.44 - 1.00 mg/dL 1.39(H) 1.19(H) 1.29(H)  Sodium 135 - 145 mmol/L 141 141 138  Potassium 3.5 - 5.1 mmol/L 4.1 3.9 4.7  Chloride 98 - 111 mmol/L 95(L) 99 98  CO2 22 - 32 mmol/L 36(H) 31 27  Calcium 8.9 - 10.3 mg/dL 8.5(L) 8.8(L) 9.1  Total Protein 6.5 - 8.1 g/dL - - 6.9  Total Bilirubin 0.3 - 1.2 mg/dL - - 0.7  Alkaline Phos 38 - 126 U/L - - 89  AST 15 - 41 U/L - - 37  ALT 0 - 44 U/L - - 25    Lab Results  Component Value Date   WBC 9.7 11/18/2018   HGB 10.2 (L) 11/18/2018   HCT 33.0 (L) 11/18/2018   MCV 93.0 11/18/2018   PLT 374 11/18/2018   NEUTROABS 8.2 (H) 11/13/2018    Dg Eye Foreign Body  Result Date: 10/21/2018 CLINICAL DATA:  Previous unspecified eye surgery; clearance prior to MRI EXAM: ORBITS FOR FOREIGN BODY - 2 VIEW COMPARISON:  None. FINDINGS: There is no evidence of metallic foreign body within the orbits. No significant bone abnormality identified. IMPRESSION: No evidence of metallic foreign body within the orbits. Electronically Signed   By: Logan Bores M.D.   On: 10/21/2018 16:34   Dg Chest 1 View  Result Date: 11/06/2018 CLINICAL DATA:  Post left-sided thoracentesis. EXAM: CHEST  1 VIEW COMPARISON:  Radiographs 11/05/2018 and 10/22/2018. FINDINGS: 1344 hours. The left pleural effusion  has decreased in volume. There is improved aeration of the left lung base. There is a stable small right pleural effusion and mild right basilar atelectasis. The heart size and mediastinal contours are stable with aortic atherosclerosis. No pneumothorax. IMPRESSION: 1. Decreased left pleural effusion and improved aeration of the left lung base status post thoracentesis. No pneumothorax. 2. Stable small right pleural effusion and right basilar atelectasis. Electronically Signed   By: Richardean Sale M.D.   On: 11/06/2018 14:16   Dg Chest 1 View  Result Date: 10/22/2018 CLINICAL DATA:  Status post left thoracentesis. EXAM: CHEST  1 VIEW COMPARISON:  Radiograph of same day.  FINDINGS: Stable cardiomegaly. No pneumothorax is noted. Left pleural effusion is significantly smaller status post thoracentesis. Mild right basilar atelectasis is noted with probable small right pleural effusion. Bony thorax is unremarkable. IMPRESSION: Left pleural effusion is significantly smaller status post thoracentesis. No pneumothorax is noted. Electronically Signed   By: Marijo Conception M.D.   On: 10/22/2018 12:57   Dg Chest 2 View  Result Date: 11/08/2018 CLINICAL DATA:  Left pleural effusion. Insertion of PleurX catheter this morning. Coughing after the procedure. EXAM: CHEST - 2 VIEW COMPARISON:  Radiographs dated 11/07/2018 and 11/06/2018 FINDINGS: There is a tiny left apical pneumothorax. PleurX catheter in place. Minimal residual pleural effusion. There is a small right pleural effusion. Chronic cardiomegaly. Pulmonary vascularity is within normal limits. No acute bone abnormality. Chronic accentuation of the thoracic kyphosis. IMPRESSION: 1. Tiny left apical pneumothorax. PleurX catheter in place. 2. Small right pleural effusion.  Minimal residual left effusion. Electronically Signed   By: Lorriane Shire M.D.   On: 11/08/2018 12:23   Dg Chest 2 View  Result Date: 11/07/2018 CLINICAL DATA:  Left thoracentesis yesterday EXAM: CHEST - 2 VIEW COMPARISON:  11/06/2018 FINDINGS: Moderate left pleural effusion appears slightly larger. No pneumothorax. Consolidation left lower lobe shows progression Small right effusion and mild right lower lobe airspace disease unchanged. Negative for heart failure or edema. IMPRESSION: Progression of left effusion and left lower lobe consolidation following left thoracentesis. No pneumothorax. Electronically Signed   By: Franchot Gallo M.D.   On: 11/07/2018 09:53   Dg Chest 2 View  Result Date: 11/05/2018 CLINICAL DATA:  Shortness of breath EXAM: CHEST - 2 VIEW COMPARISON:  October 22, 2018 FINDINGS: There is mild cardiomegaly. A moderate to large left  pleural effusion is seen. There is trace right pleural effusion. Mildly increased interstitial markings seen predominantly at the right lung base, likely chronic lung changes. Air not calcifications. IMPRESSION: Moderate to large left and trace right pleural effusion. Electronically Signed   By: Prudencio Pair M.D.   On: 11/05/2018 22:09   Mr Jeri Cos [id number] Contrast  Result Date: 10/21/2018 CLINICAL DATA:  Breast cancer staging. EXAM: MRI HEAD WITHOUT AND WITH CONTRAST TECHNIQUE: Multiplanar, multiecho pulse sequences of the brain and surrounding structures were obtained without and with intravenous contrast. CONTRAST:  78mL GADAVIST GADOBUTROL 1 MMOL/ML IV SOLN COMPARISON:  None. FINDINGS: Brain: Mild cerebral atrophy. Negative for hydrocephalus. Negative for acute or chronic infarct. Negative for hemorrhage, mass, or edema. Normal enhancement postcontrast infusion.  No enhancing mass lesion. Vascular: Normal arterial flow voids Skull and upper cervical spine: Negative Sinuses/Orbits: Negative Other: None IMPRESSION: Negative MRI brain with contrast.  Negative for metastatic disease. Electronically Signed   By: Franchot Gallo M.D.   On: 10/21/2018 17:47   Ct Abdomen Pelvis W Contrast  Result Date: 10/22/2018 CLINICAL DATA:  History of breast  cancer, breast cancer staging with suspicion for metastatic disease. EXAM: CT ABDOMEN AND PELVIS WITH CONTRAST TECHNIQUE: Multidetector CT imaging of the abdomen and pelvis was performed using the standard protocol following bolus administration of intravenous contrast. CONTRAST:  149mL OMNIPAQUE IOHEXOL 300 MG/ML  SOLN COMPARISON:  None. FINDINGS: Lower chest: Opacification of the left inferior chest due to large left-sided pleural effusion with basilar collapse, nodularity in the costodiaphragmatic sulcus best seen on sagittal images the left chest. Variable enhancement of collapsed lung tissue is incompletely imaged. Basilar atelectasis noted on the right with nodular  opacity at the right lung base. Heart is incompletely imaged, no visualized pericardial fluid or nodularity. Hepatobiliary: Subtle low-attenuation in the medial section of the left hepatic lobe (hepatic subsegment IV B) best seen on image 20, series 3 measuring approximately 1.5 x 1.4 cm. Post cholecystectomy without signs of biliary ductal dilation. Pancreas: Unremarkable. No pancreatic ductal dilatation or surrounding inflammatory changes. Spleen: No signs of focal splenic lesion. Adrenals/Urinary Tract: Adrenal glands are normal. No signs of hydronephrosis. Symmetric renal enhancement. Cyst arises from the lower pole the left kidney. Stomach/Bowel: Stomach is normal. Small bowel with normal caliber, no signs of acute small bowel process. A normal appendix. Moderate size periumbilical ventral hernia contains the midportion of the transverse colon, no signs of obstruction at this time. Moderate sigmoid diverticulosis with mild fascial thickening along the posterior aspect of sigmoid colon in the sigmoid mesocolon. Vascular/Lymphatic: Moderate to marked calcific atherosclerosis of the abdominal aorta extending into the iliac vessels. No signs of aneurysm in the abdomen. No retroperitoneal adenopathy. Scattered lymph nodes are present but without pathologic size. No signs of pelvic lymphadenopathy. Reproductive: Thickening of the endometrium beyond what is expected for this 78 year old patient measuring nearly 2 cm as estimated by CT. Other: Urinary bladder is decompressed. Hernia as discussed above. No signs of acute process in the body wall with areas of gas in the subcutaneous fat likely related to prior injection. Bowing of levator ani musculature with descent of anterior middle and posterior compartment (bladder base, uterus-vagina and rectum) visceral contents. (Findings are indicative of pelvic floor dysfunction and should be correlated clinically. Musculoskeletal: Mixed lytic and sclerotic process in the  right ischium measures 1.8 x 1.6 cm (image 74, series 3). Mixed lytic and sclerotic changes in the right eighth and ninth ribs with subacute fractures at the lateral margin of these ribs. Cortical destruction the medial cortex of the right tenth rib. Question of increased enhancement within the central canal along the distal aspect of the spinal cord and filum terminalis. Spinal degenerative changes. Small sclerotic focus at the L1 level best seen on sagittal images. Grade 1 anterolisthesis of L5 on S1 with pars defects at this level the left. Vertebral hemangioma at L3. IMPRESSION: 1. Large malignant left-sided effusion. Trace effusion on the right with basilar atelectasis. 2. Variation in parenchymal enhancement raising the question of hilar lesion in the left chest, consider CT chest if not performed for further evaluation. 3. 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 4. Endometrial thickening may represent a second primary, consider endovaginal sonogram for further assessment as warranted. 5. Question of added enhancement in these central canal along the spinal cord internal nerve roots. This could be seen in the setting of CNS involvement but is not certain. MRI of the spine for additional staging could be performed as warranted. 6. Area of vague hypoattenuation anterior  and inferior to portal structures in medial section left hepatic lobe likely an area of fatty infiltration, attention on follow-up or assess with MR for definitive characterization. Electronically Signed   By: Zetta Bills M.D.   On: 10/22/2018 08:26   Dg Chest Port 1 View  Result Date: 11/18/2018 CLINICAL DATA:  Shortness of breath.  Weakness.  Pleural effusion. EXAM: PORTABLE CHEST 1 VIEW COMPARISON:  11/17/2018. FINDINGS: Left PleurX catheter in stable position. Cardiomegaly with pulmonary venous congestion again noted  without interim change. Low lung volumes. Bibasilar infiltrates/edema again noted. Small unchanged bilateral pleural effusions again noted. No pneumothorax. IMPRESSION: 1.  Left PleurX catheter in stable position.  No pneumothorax. 2. Cardiomegaly with pulmonary venous congestion again noted without interim change. 3. Low lung volumes. Bibasilar and infiltrates/edema again noted. Small unchanged bilateral pleural effusions again noted. Electronically Signed   By: Marcello Moores  Register   On: 11/18/2018 06:44   Dg Chest Port 1 View  Result Date: 11/17/2018 CLINICAL DATA:  Shortness of breath. EXAM: PORTABLE CHEST 1 VIEW COMPARISON:  Radiograph yesterday. CT 10/16/2018 FINDINGS: Left PleurX catheter remains in place. Low lung volumes with bibasilar volume loss. Hazy bibasilar opacities consistent with pleural effusions adjacent atelectasis/consolidation. Cardiomegaly, not well assessed due to adjacent airspace disease. Worsening vascular congestion. No visualized pneumothorax. IMPRESSION: 1. Worsening vascular congestion. 2. Bibasilar opacities consistent with pleural effusions and adjacent atelectasis/consolidation. 3. Left PleurX catheter remains in place. No visualized pneumothorax. 4. Cardiomegaly. Electronically Signed   By: Keith Rake M.D.   On: 11/17/2018 02:19   Dg Chest Port 1 View  Result Date: 11/16/2018 CLINICAL DATA:  Shortness of breath EXAM: PORTABLE CHEST 1 VIEW COMPARISON:  November 09, 2018 FINDINGS: There is mild cardiomegaly. Again noted is a left-sided pleural catheter. Aortic knob calcifications. There is a small left pleural effusion with patchy airspace opacity at the left lung base which is new. There is a trace right pleural effusion with hazy airspace opacity at the right lung base. IMPRESSION: Small bilateral pleural effusions, left greater than right with new hazy airspace opacities at both lung bases which could be due to infectious etiology, layering effusion, and/or edema.  Cardiomegaly Left-sided pleural catheter. Electronically Signed   By: Prudencio Pair M.D.   On: 11/16/2018 19:15   Dg Chest Port 1 View  Result Date: 11/09/2018 CLINICAL DATA:  Short of breath and weakness. EXAM: PORTABLE CHEST 1 VIEW COMPARISON:  1 day prior FINDINGS: Left-sided PleurX catheter remains in place. The Chin overlies the apices. Moderate cardiomegaly. Increase in small left pleural effusion. Given limitations of chin overlying the left apex, no residual pneumothorax is identified. Similar right and worsened left base airspace disease. Suspect developing mild interstitial edema. IMPRESSION: Worsened aeration, with probable developing interstitial edema and progressive bibasilar airspace disease. Left pleural drain in place, without convincing evidence of residual pneumothorax. Electronically Signed   By: Abigail Miyamoto M.D.   On: 11/09/2018 10:53   Dg Chest Port 1 View  Result Date: 10/22/2018 CLINICAL DATA:  Pleural effusion. EXAM: PORTABLE CHEST 1 VIEW COMPARISON:  10/16/2018. FINDINGS: Stable cardiomegaly. No pulmonary venous congestion. Prominent left-sided pleural effusion, progressed from prior exam. Underlying left lower lung atelectasis/infiltrate most likely present. Atelectatic changes right lung base also again noted. IMPRESSION: 1.  Stable cardiomegaly.  No pulmonary venous congestion. 2. Prominent left-sided pleural effusion, progressed from prior exam. Underlying left lower lobe atelectasis/infiltrate most likely present. Atelectatic changes right lung base also again noted. Electronically Signed   By: Marcello Moores  Register  On: 10/22/2018 10:03   Ir Thoracentesis Asp Pleural Space W/img Guide  Result Date: 10/22/2018 INDICATION: Patient with history of CHF, chronic kidney disease, metastatic breast cancer, dyspnea, recurrent left pleural effusion. Request made for diagnostic and therapeutic left thoracentesis. EXAM: ULTRASOUND GUIDED DIAGNOSTIC AND THERAPEUTIC LEFT THORACENTESIS  MEDICATIONS: None COMPLICATIONS: None immediate. PROCEDURE: An ultrasound guided thoracentesis was thoroughly discussed with the patient and questions answered. The benefits, risks, alternatives and complications were also discussed. The patient understands and wishes to proceed with the procedure. Written consent was obtained. Ultrasound was performed to localize and mark an adequate pocket of fluid in the left chest. The area was then prepped and draped in the normal sterile fashion. 1% Lidocaine was used for local anesthesia. Under ultrasound guidance a 6 Fr Safe-T-Centesis catheter was introduced. Thoracentesis was performed. The catheter was removed and a dressing applied. FINDINGS: A total of approximately 820 cc of hazy, amber fluid was removed. Samples were sent to the laboratory as requested by the clinical team. IMPRESSION: Successful ultrasound guided diagnostic and therapeutic left thoracentesis yielding 820 cc of pleural fluid. Read by: Rowe Robert, PA-C Electronically Signed   By: Sandi Mariscal M.D.   On: 10/22/2018 12:25   Ir Perc Pleural Drain W/indwell Cath W/img Guide  Result Date: 11/08/2018 INDICATION: 78 year old female with stage IV breast cancer and a recurrent symptomatic left-sided malignant pleural effusion. She presents for placement of a tunneled pleural drainage catheter. EXAM: Placement of a tunneled pleural drainage catheter MEDICATIONS: 2 g Ancef ANESTHESIA/SEDATION: Fentanyl 50 mcg IV; Versed 1.5 mg IV Moderate Sedation Time:  20 minutes The patient was continuously monitored during the procedure by the interventional radiology nurse under my direct supervision. COMPLICATIONS: None immediate. PROCEDURE: Informed written consent was obtained from the patient after a thorough discussion of the procedural risks, benefits and alternatives. All questions were addressed. Maximal Sterile Barrier Technique was utilized including caps, mask, sterile gowns, sterile gloves, sterile drape,  hand hygiene and skin antiseptic. A timeout was performed prior to the initiation of the procedure. Ultrasound was used to interrogate the left chest. A large pleural effusion is present. A suitable skin entry site was selected and marked. Local anesthesia was attained by infiltration with 1% lidocaine. A small dermatotomy was made. An 18 gauge sheath needle was then advanced over a rib and into the pleural space. The sheath was left in place while the 18 gauge needle was removed. A short Amplatz wire was advanced through the sheath needle and positioned in the pleural space. A suitable skin exit site 5 cm anterior and medial to the pleural entry site was identified. Local anesthesia was again attained by infiltration with 1% lidocaine. A second small dermatotomy was made. The pleural drainage catheter was then tunneled from the exit site to the dermatotomy overlying the pleural entry site. The sheath needle was then removed over the wire. A peel-away sheath was advanced over the wire and into the pleural space. The tunneled drainage catheter was then advanced through the peel-away sheath and the peel-away sheath was discarded. The catheter was connected to a vacuum bottle and approximately 700 mL of pleural fluid was evacuated. Further aspiration was stopped due to symptomatic coughing. The catheter was secured to the skin with 0 Prolene suture. The dermatotomy overlying the pleural entry site was closed with Dermabond. Sterile bandages were placed. IMPRESSION: Technically successful placement of a left tunneled pleural drainage catheter. Signed, Criselda Peaches, MD, [facility] Electronically Signed  By: Jacqulynn Cadet M.D.   On: 11/08/2018 10:05   US Thoracentesis Asp Pleural Space W/img Guide  Result Date: 11/06/2018 INDICATION: Patient with history of CHF, chronic kidney disease, dyspnea, metastatic breast cancer, recurrent left pleural  effusion. Request made for therapeutic left thoracentesis. EXAM: ULTRASOUND GUIDED THERAPEUTIC LEFT THORACENTESIS MEDICATIONS: None COMPLICATIONS: None immediate. PROCEDURE: An ultrasound guided thoracentesis was thoroughly discussed with the patient and questions answered. The benefits, risks, alternatives and complications were also discussed. The patient understands and wishes to proceed with the procedure. Written consent was obtained. Ultrasound was performed to localize and mark an adequate pocket of fluid in the left chest. The area was then prepped and draped in the normal sterile fashion. 1% Lidocaine was used for local anesthesia. Under ultrasound guidance a 6 Fr Safe-T-Centesis catheter was introduced. Thoracentesis was performed. The catheter was removed and a dressing applied. FINDINGS: A total of approximately 900 cc of amber/blood-tinged fluid was removed. Due to persistent patient coughing only the above amount of fluid was removed today. IMPRESSION: Successful ultrasound guided therapeutic left thoracentesis yielding 900 cc of pleural fluid. Read by: Rowe Robert, PA-C Electronically Signed   By: Jacqulynn Cadet M.D.   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 Cytology on left-sided pleural effusion-positive for malignant cells consistent with adenocarcinoma.IHC not done to determine primary site. 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 #8 hospital admission for SVT 11/16/2018.  PLAN: -LFTs and renal function remain normal.  She has mild anemia.  Continue letrozole and Verzenio.  The patient was reassured that despite her  hospitalization, she remains on treatment for her breast cancer.  We will plan to see her as an outpatient for follow-up.  Her current appointment is scheduled for 11/21/2018.  If she is still in the hospital, we can delay this appointment. -The patient has several test scheduled for later this week including a transvaginal ultrasound and an MRI of the lumbar spine with and without contrast.  We can reschedule these as an outpatient. -Continue to drain left Pleurx catheter every other day. -Continue anticoagulation per hospitalist. -continue f/u with cardiologist for optimization of CHF and afib management -Continue metoprolol per hospitalist. -The patient will be started on Zometa as an outpatient due to bone metastases   LOS: 2 days   Mikey Bussing, DNP, AGPCNP-BC, AOCNP 11/18/18

## 2018-11-18 NOTE — Plan of Care (Signed)
  Problem: Education: Goal: Ability to demonstrate management of disease process will improve Outcome: Progressing Goal: Ability to verbalize understanding of medication therapies will improve Outcome: Progressing Goal: Individualized Educational Video(s) Outcome: Progressing   Problem: Activity: Goal: Capacity to carry out activities will improve Outcome: Progressing   Problem: Cardiac: Goal: Ability to achieve and maintain adequate cardiopulmonary perfusion will improve Outcome: Progressing   Problem: Education: Goal: Knowledge of General Education information will improve Description: Including pain rating scale, medication(s)/side effects and non-pharmacologic comfort measures Outcome: Progressing   Problem: Health Behavior/Discharge Planning: Goal: Ability to manage health-related needs will improve Outcome: Progressing   Problem: Clinical Measurements: Goal: Ability to maintain clinical measurements within normal limits will improve Outcome: Progressing Goal: Will remain free from infection Outcome: Progressing Goal: Diagnostic test results will improve Outcome: Progressing Goal: Respiratory complications will improve Outcome: Progressing Goal: Cardiovascular complication will be avoided Outcome: Progressing   Problem: Nutrition: Goal: Adequate nutrition will be maintained Outcome: Progressing   Problem: Coping: Goal: Level of anxiety will decrease Outcome: Progressing   Problem: Elimination: Goal: Will not experience complications related to bowel motility Outcome: Progressing Goal: Will not experience complications related to urinary retention Outcome: Progressing   Problem: Pain Managment: Goal: General experience of comfort will improve Outcome: Progressing

## 2018-11-19 ENCOUNTER — Ambulatory Visit (HOSPITAL_COMMUNITY): Payer: Medicare HMO | Attending: Hematology

## 2018-11-19 ENCOUNTER — Inpatient Hospital Stay (HOSPITAL_COMMUNITY): Payer: Medicare HMO

## 2018-11-19 ENCOUNTER — Ambulatory Visit (HOSPITAL_COMMUNITY): Admission: RE | Admit: 2018-11-19 | Payer: Medicare HMO | Source: Ambulatory Visit

## 2018-11-19 DIAGNOSIS — R778 Other specified abnormalities of plasma proteins: Secondary | ICD-10-CM

## 2018-11-19 DIAGNOSIS — I482 Chronic atrial fibrillation, unspecified: Secondary | ICD-10-CM

## 2018-11-19 LAB — CBC
HCT: 32.2 % — ABNORMAL LOW (ref 36.0–46.0)
Hemoglobin: 10 g/dL — ABNORMAL LOW (ref 12.0–15.0)
MCH: 28.7 pg (ref 26.0–34.0)
MCHC: 31.1 g/dL (ref 30.0–36.0)
MCV: 92.5 fL (ref 80.0–100.0)
Platelets: 392 10*3/uL (ref 150–400)
RBC: 3.48 MIL/uL — ABNORMAL LOW (ref 3.87–5.11)
RDW: 14.5 % (ref 11.5–15.5)
WBC: 8.5 10*3/uL (ref 4.0–10.5)
nRBC: 0 % (ref 0.0–0.2)

## 2018-11-19 LAB — BASIC METABOLIC PANEL
Anion gap: 13 (ref 5–15)
BUN: 18 mg/dL (ref 8–23)
CO2: 36 mmol/L — ABNORMAL HIGH (ref 22–32)
Calcium: 8.6 mg/dL — ABNORMAL LOW (ref 8.9–10.3)
Chloride: 90 mmol/L — ABNORMAL LOW (ref 98–111)
Creatinine, Ser: 1.59 mg/dL — ABNORMAL HIGH (ref 0.44–1.00)
GFR calc Af Amer: 35 mL/min — ABNORMAL LOW (ref >60)
GFR calc non Af Amer: 31 mL/min — ABNORMAL LOW (ref >60)
Glucose, Bld: 120 mg/dL — ABNORMAL HIGH (ref 70–99)
Potassium: 3.4 mmol/L — ABNORMAL LOW (ref 3.5–5.1)
Sodium: 139 mmol/L (ref 135–145)

## 2018-11-19 LAB — MAGNESIUM: Magnesium: 1.8 mg/dL (ref 1.7–2.4)

## 2018-11-19 MED ORDER — METOPROLOL TARTRATE 50 MG PO TABS
50.0000 mg | ORAL_TABLET | Freq: Two times a day (BID) | ORAL | Status: DC
  Administered 2018-11-19 – 2018-11-20 (×3): 50 mg via ORAL
  Filled 2018-11-19 (×4): qty 1

## 2018-11-19 MED ORDER — POTASSIUM CHLORIDE CRYS ER 20 MEQ PO TBCR: 40.0000 meq | EXTENDED_RELEASE_TABLET | Freq: Once | ORAL | Status: DC

## 2018-11-19 MED ORDER — DM-GUAIFENESIN ER 30-600 MG PO TB12
1.0000 | ORAL_TABLET | Freq: Two times a day (BID) | ORAL | Status: DC
  Administered 2018-11-19 – 2018-11-24 (×10): 1 via ORAL
  Filled 2018-11-19 (×10): qty 1

## 2018-11-19 MED ORDER — FUROSEMIDE 40 MG PO TABS
40.0000 mg | ORAL_TABLET | Freq: Every day | ORAL | Status: DC
  Administered 2018-11-20 – 2018-11-24 (×5): 40 mg via ORAL
  Filled 2018-11-19 (×5): qty 1

## 2018-11-19 MED ORDER — MAGNESIUM SULFATE 2 GM/50ML IV SOLN
2.0000 g | Freq: Once | INTRAVENOUS | Status: AC
  Administered 2018-11-19: 2 g via INTRAVENOUS
  Filled 2018-11-19: qty 50

## 2018-11-19 MED ORDER — SODIUM CHLORIDE 0.9 % IV SOLN
1.0000 g | INTRAVENOUS | Status: DC
  Administered 2018-11-19: 1 g via INTRAVENOUS
  Filled 2018-11-19: qty 10

## 2018-11-19 MED ORDER — POTASSIUM CHLORIDE CRYS ER 20 MEQ PO TBCR
20.0000 meq | EXTENDED_RELEASE_TABLET | Freq: Once | ORAL | Status: AC
  Administered 2018-11-19: 09:00:00 20 meq via ORAL
  Filled 2018-11-19: qty 1

## 2018-11-19 MED ORDER — APIXABAN 2.5 MG PO TABS: 2.5000 mg | ORAL_TABLET | Freq: Two times a day (BID) | ORAL | Status: DC

## 2018-11-19 MED ORDER — APIXABAN 5 MG PO TABS
5.0000 mg | ORAL_TABLET | Freq: Two times a day (BID) | ORAL | Status: DC
  Administered 2018-11-19 – 2018-11-24 (×10): 5 mg via ORAL
  Filled 2018-11-19 (×10): qty 1

## 2018-11-19 MED ORDER — METOPROLOL TARTRATE 50 MG PO TABS
50.0000 mg | ORAL_TABLET | Freq: Two times a day (BID) | ORAL | Status: DC
  Administered 2018-11-19: 09:00:00 50 mg via ORAL
  Filled 2018-11-19: qty 1

## 2018-11-19 MED ORDER — POTASSIUM CHLORIDE CRYS ER 20 MEQ PO TBCR
20.0000 meq | EXTENDED_RELEASE_TABLET | Freq: Once | ORAL | Status: AC
  Administered 2018-11-19: 10:00:00 20 meq via ORAL
  Filled 2018-11-19: qty 1

## 2018-11-19 NOTE — Progress Notes (Signed)
Went  into SVT- hr 190 while sitting in a commode,  Asymptomatic  Assisted back to bed,  MD aware and seen pt. at once. Mg sulfate iv given, kcl and lopressor 50 mg po given.  HR_ went down to 70's after and hour , continue to monitor.

## 2018-11-19 NOTE — Progress Notes (Signed)
Vomited to mucoid emesis while ambulating along the hallway with PT. Assisted back to bed, zofran iv given. Continue to monitor.

## 2018-11-19 NOTE — Progress Notes (Signed)
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  Treated for SVT in ED and had recurrence overnight day of admission which resolved spontaneouly..    PT Comments    Pt tolerated treatment well despite nausea and vomiting at conclusion of ambulation. Pt increased ambulation tolerance while utilizing RW. Pt continues to fatigue quickly during activity, and is not at a point where she can manage daily mobility and ADLs in the home alone. PT updating discharge recommendations to SNF at this time to aide in a return to her prior level of function. Pt will continue to benefit from acute PT services to reduce falls risk and restore independent mobility.   Follow Up Recommendations  SNF     Equipment Recommendations  Rolling walker with 5" wheels    Recommendations for Other Services       Precautions / Restrictions Precautions Precautions: Fall Precaution Comments: pleurx, L side Restrictions Weight Bearing Restrictions: No    Mobility  Bed Mobility Overal bed mobility: Needs Assistance Bed Mobility: Supine to Sit;Sit to Supine     Supine to sit: Supervision;HOB elevated Sit to supine: Supervision;HOB elevated      Transfers Overall transfer level: Needs assistance Equipment used: None Transfers: Sit to/from Stand Sit to Stand: Min guard         General transfer comment: min guard for safety/balance   Ambulation/Gait Ambulation/Gait assistance: Supervision Gait Distance (Feet): 120 Feet Assistive  device: Rolling walker (2 wheeled) Gait Pattern/deviations: Step-to pattern Gait velocity: reduced Gait velocity interpretation: <1.8 ft/sec, indicate of risk for recurrent falls General Gait Details: slowed step to gait, unremarkable other than reduced gait speed   Stairs             Wheelchair Mobility    Modified Rankin (Stroke Patients Only)       Balance Overall balance assessment: Needs assistance Sitting-balance support: Feet supported;No upper extremity supported Sitting balance-Leahy Scale: Good     Standing balance support: No upper extremity supported Standing balance-Leahy Scale: Fair Standing balance comment: minG for static standing balance without UE support. Supervision for balance with BUE support of RW                            Cognition Arousal/Alertness: Awake/alert Behavior During Therapy: [facility] for tasks assessed/performed;Anxious Overall Cognitive Status: Within Functional Limits for tasks assessed                                        Exercises      General Comments General comments (skin integrity, edema, etc.): pt with nausea and vomiting while being wheeled back to room in recliner, spitting up thick, clear mucous      Pertinent Vitals/Pain Pain Assessment: No/denies pain    Home Living                      Prior Function            PT  Goals (current goals can now be found in the care plan section) Acute Rehab PT Goals Patient Stated Goal: to feel better and get home  Progress towards PT goals: Progressing toward goals    Frequency    Min 2X/week      PT Plan Discharge plan needs to be updated    Co-evaluation              AM-PAC PT "6 Clicks" Mobility   Outcome Measure  Help needed turning from your back to your side while in a flat bed without using bedrails?: None Help needed moving from lying on your back to sitting on the side of a flat bed without using bedrails?:  None Help needed moving to and from a bed to a chair (including a wheelchair)?: A Little Help needed standing up from a chair using your arms (e.g., wheelchair or bedside chair)?: A Little Help needed to walk in hospital room?: A Little Help needed climbing 3-5 steps with a railing? : A Lot 6 Click Score: 19    End of Session Equipment Utilized During Treatment: Oxygen Activity Tolerance: Patient limited by fatigue Patient left: in bed;with call bell/phone within reach Nurse Communication: Mobility status PT Visit Diagnosis: Unsteadiness on feet (R26.81);Muscle weakness (generalized) (M62.81)     Time: [id number] PT Time Calculation (min) (ACUTE ONLY): 31 min  Charges:  $Gait Training: 8-22 mins $Therapeutic Activity: 8-22 mins                     Zenaida Niece, PT, DPT Acute Rehabilitation Pager: [phone]    Zenaida Niece 11/19/2018, 12:39 PM

## 2018-11-19 NOTE — Progress Notes (Addendum)
Progress Note  Patient Name: Beverly Tucker Date of Encounter: 11/19/2018  Primary Cardiologist: No primary care provider on file.   Subjective  O/N events: Urinated 3L output. Hypomag, hypokalemia replete  Mrs.Beverly Tucker was examined and evaluated at bedside this AM. She mentions significant improvement in symptoms. Mentions how happy she is to feel back to baseline. She states she felt she needed her nebulizer treatment earlier as her dyspnea is much improved after her morning Duoneb. Denies any chest pain, palpitations, dyspnea.   Inpatient Medications    Scheduled Meds: . abemaciclib  50 mg Oral BID  . apixaban  5 mg Oral BID  . doxycycline  100 mg Oral Q12H  . feeding supplement (ENSURE ENLIVE)  237 mL Oral BID BM  . furosemide  40 mg Intravenous Daily  . ipratropium-albuterol  3 mL Nebulization BID  . letrozole  2.5 mg Oral Daily  . metoprolol tartrate  5 mg Intravenous Once  . metoprolol tartrate  50 mg Oral BID  . sodium chloride flush  3 mL Intravenous Q12H  . Vitamin D (Ergocalciferol)  50,000 Units Oral Q Mon   Continuous Infusions: . sodium chloride     PRN Meds: sodium chloride, acetaminophen, albuterol, ALPRAZolam, dextromethorphan-guaiFENesin, hydrALAZINE, loperamide, nitroGLYCERIN, ondansetron (ZOFRAN) IV, oxyCODONE-acetaminophen, sodium chloride flush   Vital Signs    Vitals:   11/19/18 0055 11/19/18 0300 11/19/18 0503 11/19/18 0630  BP: 140/72  (!) 150/68   Pulse: 69 75 83   Resp: (!) 24 (!) 22 19   Temp: 98.2 F (36.8 C)  99.1 F (37.3 C)   TempSrc: Oral  Oral   SpO2: 98% 99% 97%   Weight:    79.8 kg  Height:        Intake/Output Summary (Last 24 hours) at 11/19/2018 0659 Last data filed at 11/18/2018 2203 Gross per 24 hour  Intake 243 ml  Output 700 ml  Net -457 ml   Filed Weights   11/17/18 0341 11/18/18 0419 11/19/18 0630  Weight: 82.5 kg 80.6 kg 79.8 kg    Telemetry    HR slowly down to 60-80s overnight. Intermittent PVCs -  Personally Reviewed  ECG    No new tracing to review - Personally Reviewed  Physical Exam   Gen: Well-developed, chronically ill-appearing, NAD HEENT: NCAT head, hearing intact, EOMI, MMM Neck: supple, ROM intact, no JVD CV: RRR, S1, S2 normal, No rubs, no murmurs, no gallops Pulm: Mild expiratory wheezing, L sided rales, no rales on R, chest tube intact Abd: Soft, BS+, NTND, No rebound, no guarding Extm: ROM intact, Peripheral pulses intact, No peripheral edema Skin: Dry, Warm, normal turgor  Labs    Chemistry Recent Labs  Lab 11/13/18 0935 11/16/18 1840  11/18/18 0322 11/18/18 1426 11/19/18 0227  NA 140 138   < > 141 135 139  K 4.0 4.7   < > 4.1 4.0 3.4*  CL 98 98   < > 95* 89* 90*  CO2 33* 27   < > 36* 32 36*  GLUCOSE 112* 145*   < > 121* 162* 120*  BUN 19 15   < > 15 15 18   CREATININE 1.28* 1.29*   < > 1.39* 1.48* 1.59*  CALCIUM 9.0 9.1   < > 8.5* 9.0 8.6*  PROT 6.5 6.9  --   --   --   --   ALBUMIN 2.7* 3.0*  --   --   --   --   AST 18 37  --   --   --   --  ALT 16 25  --   --   --   --   ALKPHOS 79 89  --   --   --   --   BILITOT 0.3 0.7  --   --   --   --   GFRNONAA 40* 39*   < > 36* 33* 31*  GFRAA 46* 46*   < > 42* 39* 35*  ANIONGAP 9 13   < > 10 14 13    < > = values in this interval not displayed.     Hematology Recent Labs  Lab 11/17/18 0031 11/18/18 0322 11/19/18 0227  WBC 11.9* 9.7 8.5  RBC 3.41* 3.55* 3.48*  HGB 9.9* 10.2* 10.0*  HCT 32.3* 33.0* 32.2*  MCV 94.7 93.0 92.5  MCH 29.0 28.7 28.7  MCHC 30.7 30.9 31.1  RDW 14.6 14.4 14.5  PLT 409* 374 392    Cardiac EnzymesNo results for input(s): TROPONINI in the last 168 hours. No results for input(s): TROPIPOC in the last 168 hours.   BNP Recent Labs  Lab 11/16/18 1840  BNP 183.7*     DDimer No results for input(s): DDIMER in the last 168 hours.   Radiology    Dg Chest Port 1 View  Result Date: 11/18/2018 CLINICAL DATA:  Shortness of breath.  Weakness.  Pleural effusion. EXAM:  PORTABLE CHEST 1 VIEW COMPARISON:  11/17/2018. FINDINGS: Left PleurX catheter in stable position. Cardiomegaly with pulmonary venous congestion again noted without interim change. Low lung volumes. Bibasilar infiltrates/edema again noted. Small unchanged bilateral pleural effusions again noted. No pneumothorax. IMPRESSION: 1.  Left PleurX catheter in stable position.  No pneumothorax. 2. Cardiomegaly with pulmonary venous congestion again noted without interim change. 3. Low lung volumes. Bibasilar and infiltrates/edema again noted. Small unchanged bilateral pleural effusions again noted. Electronically Signed   By: [place]   On: 11/18/2018 06:44    Cardiac Studies   TTE 11/15/18 IMPRESSIONS  1. Left ventricular ejection fraction, by visual estimation, is 55 to 60%. The left ventricle has normal function. Normal left ventricular size. There is no left ventricular hypertrophy.  2. Left ventricular diastolic Doppler parameters are consistent with impaired relaxation pattern of LV diastolic filling.  3. Global right ventricle has normal systolic function.The right ventricular size is normal. No increase in right ventricular wall thickness.  4. Left atrial size was normal.  5. Right atrial size was normal.  6. Moderate thickening of the mitral valve leaflet(s).  7. Severe mitral annular calcification.  8. The mitral valve is normal in structure. Trace mitral valve regurgitation.  9. The tricuspid valve is grossly normal. Tricuspid valve regurgitation is mild. 10. The aortic valve is tricuspid Aortic valve regurgitation is mild by color flow Doppler. Mild to moderate aortic valve sclerosis/calcification without any evidence of aortic stenosis. 11. The pulmonic valve was grossly normal. Pulmonic valve regurgitation is mild by color flow Doppler.   Patient Profile     Beverly Tucker is a 78 y.o. female with a hx of HTN, GERD, anxiety, stage 4 breast ca on chemo, recurrent malignant pleural  effusion, HFpEF, A.fib on Eliquis and CKD3 who is being seen today for the evaluation of recurrent SVT  Assessment & Plan    Paroxysmal Atrial Fibrillation with RVR Recurrent SVT Hx of A.fib w/ RVR on metoprolol and eliquis. CHADSVASC score of 5 due to age, htn, chf, gender. Presents with symptomatic SVT w/ HR >160. Treated w/ adenosine with resolution. No new episode of SVTs overnight. HR  currently in 60-80s. Asymptomatic. - Telemetry - Monitor K, Mag, treat if hyper- replete if hypo - C/w Eliquis  Acute on chronic diastolic heart failure Echocardiogram w/ EF 55-60%. BNP on admission 183.7. Additional 3L output on IV lasix 40mg . K 3.4, mag 1.8 replete overnight. Renal fx worsening. Would transition from IV to oral lasix - Switch to oral furosemide - Daily weight, I&Os  [facility] will sign off.   Medication Recommendations:  Metoprolol 50mg  BID, Furosemide 40mg  daily  Other recommendations (labs, testing, etc):  N/A Follow up as an outpatient:  Y  For questions or updates, please contact [place] Please consult [email or web address] for contact info under Cardiology/STEMI.   Signed, Gilberto Better, MD PGY-2, [facility] Pager: [phone] 11/19/2018, 6:59 AM    Attending attestation to follow  The patient was seen, examined and discussed with the resident Mosetta Anis, MD   and I agree with the above.   The patient further diuresed overnight, appears euvolemic, Crea up, hold lasix today, start lasix 40 mg PO daily tomorrow.  Continue metoprolol 50 mg PO BID, no further episodes of SVT, we will sign off. Pleas call us with any questions.   Ena Dawley, MD 11/19/2018

## 2018-11-19 NOTE — Progress Notes (Signed)
Pleurx drained by RN Dawn, obtained 100 ml amber output tolerated well. Dressing applied aseptically.

## 2018-11-19 NOTE — Progress Notes (Addendum)
PROGRESS NOTE    Beverly Tucker   [id number] DOB: [birth year] DOA: 11/16/2018  Admitted from: Home.  Typically independent for most of the ADL PCP: [name], MD   Hospital Summary  Is a 78 year old female with past medical history of hypertension, GERD, anxiety, metastatic right breast cancer recently started on abemaciclib on 10/12 and on letrozole (follows with Dr. Roney Marion appointment 10/5), recurrent malignant pleural effusion with Pleurx, diastolic heart failure, atrial fibrillation on Eliquis, SVT, pneumothorax, CKD 3 who presented to the ED on 10/17 with 1 week of worsening shortness of breath, leg edema and palpitations.  In the ED patient was found to have SVT with heart rate up to 180s and was given 6 mg of adenosine with conversion to NSR.  She was found to have presentation troponin 32, WBC 13.7, BNP 183, lactic acid 1.8, - Covid.  Chest x-ray showed small bilateral pleural effusions and somewhat new hazy opacity and started on empiric azithromycin.  She was admitted for acute on chronic diastolic heart failure exacerbation received IV diuresis.  Overnight 10/17-10/18 patient had a recurrence of SVT which continuously converted to NSR without any medical intervention.  A & P   Principal Problem:   SVT (supraventricular tachycardia) (HCC) Active Problems:   CKD (chronic kidney disease) stage 3, GFR 30-59 ml/min   HTN (hypertension)   Metastatic breast cancer (HCC)   Pleural effusion   Acute on chronic diastolic CHF (congestive heart failure) (HCC)   Elevated troponin   Leukocytosis   Atrial fibrillation, chronic (HCC)   Malnutrition of moderate degree   Acute Hypoxic Respiratory Failure secondary to decompensated heart failure 99% on 3 L nasal canula, not on home O2. With wheeze and pleural effusions.   Titrate oxygen for SPO2 greater than 92%  Discontinue albuterol neb in setting of recurrent SVT  Continue to diurese  SVT status post adenosine 6 mg x 1 in ED  with conversion to NSR and again with brief recurrence of SVT overnight which spontaneously converted to NSR 10/19. Again with recurrence of hemodynamically stable SVT this morning during BM prior to receiving her Lopressor.   . Given Beta blocker . Cardio notified . Telemetry   Atrial fibrillation with RVR CHA2DS2-VASc: 5 on Eliquis.  . Continue metoprolol 50 mg p.o. twice daily . Continue Eliquis . Monitor electrolytes and replete as necessary  Acute on chronic diastolic heart failure in setting of Afib with RVR last echo (10/15/18) EF 65 -70%, EF 55 -60% this admission.  Diuresed 2.4 L total, Wt 82.1 ->80.6->79.8 kg today, still volume overloaded but with creatinine bump 1.19 -> 1.39->1.59 likely cardio renal. . Continue beta blocker . Continue Lasix 40 mg IV daily, continue 40 mg p.o. at discharge . Continue heart failure protocol with daily weights, strict I's/O, low-salt diet and fluid restriction  Elevated troponin troponin 32 which peaked at 49.  Suspect demand ischemia in setting of heart failure and SVT.   Marland Kitchen Continue telemetry  Elevated creatinine on CKD 3a Cr 1.19->1.39->1.59, suspect cardio renal.  Continue to diurese  Continue beta blocker  Hypertension stable . Metoprolol . As needed hydralazine p.o. . Continue Lasix  Metastatic breast cancer patient of Dr. [name], Oncology. On letrozole and abemaciclib (recently started abemaciclib 10/12).  Seen by oncology this hospitalization  has outpatient follow-up 10/22  Will be started on Zometa outpatient for bone mets  Recurrent malignant pleural effusion Pleurx drain placed 10/9. Drained 200 cc overnight.   . Follow-up  Leukocytosis resolved. Afebrile.  Azythromycin added on admission for empiric coverage which was switched to doxycycline 10/19.  Shortness of breath and cough is likely due to acute on chronic heart failure, although she does have a persistent infiltrate on xray and not feeling well.  . Continue for empiric  coverage   DVT prophylaxis: Eliquis   Code Status: Full Code  Discussed with son at bedside Diet: Heart healthy Disposition Plan: Home with home health when medically stable  Consultants  . Oncology . Cardiology  Procedures  . Adenosine 6 mg x 1 for SVT  Antibiotics  Day 3/5 total antibiotics azithromycin changed to Doxycycline 10/19    Subjective   Paged by nursing this morning patient had HR up to [id number] on telemetry ilicited during BM. BP stable. Patient did not receive her metoprolol yet. Metoprolol given.   Patient admits to some anxious feeling but otherwise stable. Says she was feeling well yesterday with diuresis and beta blockade until this morning as above.  Denies chest pain, palpitations, nausea. Otherwise no complaints.   Objective   Vitals:   11/19/18 0300 11/19/18 0503 11/19/18 0630 11/19/18 0708  BP:  (!) 150/68    Pulse: 75 83  78  Resp: (!) 22 19  20   Temp:  99.1 F (37.3 C)    TempSrc:  Oral    SpO2: 99% 97%  99%  Weight:   79.8 kg   Height:        Intake/Output Summary (Last 24 hours) at 11/19/2018 0720 Last data filed at 11/18/2018 2203 Gross per 24 hour  Intake 243 ml  Output 700 ml  Net -457 ml   Filed Weights   11/17/18 0341 11/18/18 0419 11/19/18 0630  Weight: 82.5 kg 80.6 kg 79.8 kg    Examination:  Physical Exam Vitals signs and nursing note reviewed.  Constitutional:      Appearance: Normal appearance. She is well-developed.  HENT:     Head: Normocephalic and atraumatic.     Nose: Nose normal.     Mouth/Throat:     Mouth: Mucous membranes are moist.  Eyes:     Extraocular Movements: Extraocular movements intact.  Neck:     Musculoskeletal: Normal range of motion. No neck rigidity.  Cardiovascular:     Rate and Rhythm: Tachycardia present. Rhythm irregular.     Comments: Afib with RVR converted to NSR with frequent PVCs at bedside. HR 90s-low 100s Pulmonary:     Effort: Pulmonary effort is normal.     Comments:  Bilateral wheeze Bibasilar rales Abdominal:     General: Abdomen is flat.     Palpations: Abdomen is soft.  Musculoskeletal: Normal range of motion.        General: No swelling.     Comments: Trace-1+ pitting edema of lower extremities  Neurological:     General: No focal deficit present.     Mental Status: She is alert. Mental status is at baseline.  Psychiatric:        Mood and Affect: Mood is anxious.        Behavior: Behavior normal.     Data Reviewed: I have personally reviewed following labs and imaging studies  CBC: Recent Labs  Lab 11/13/18 0935 11/16/18 1840 11/17/18 0031 11/18/18 0322 11/19/18 0227  WBC 10.4 13.7* 11.9* 9.7 8.5  NEUTROABS 8.2*  --   --   --   --   HGB 10.1* 11.3* 9.9* 10.2* 10.0*  HCT 33.1* 37.5 32.3* 33.0* 32.2*  MCV 93.0 95.7 94.7 93.0  92.5  PLT 317 462* 409* 374 0000000   Basic Metabolic Panel: Recent Labs  Lab 11/16/18 1840 11/17/18 0031 11/18/18 0322 11/18/18 1426 11/19/18 0227  NA 138 141 141 135 139  K 4.7 3.9 4.1 4.0 3.4*  CL 98 99 95* 89* 90*  CO2 27 31 36* 32 36*  GLUCOSE 145* 114* 121* 162* 120*  BUN 15 12 15 15 18   CREATININE 1.29* 1.19* 1.39* 1.48* 1.59*  CALCIUM 9.1 8.8* 8.5* 9.0 8.6*  MG 2.3 2.0 1.9 1.8 1.8   GFR: Estimated Creatinine Clearance: 30.6 mL/min (A) (by C-G formula based on SCr of 1.59 mg/dL (H)). Liver Function Tests: Recent Labs  Lab 11/13/18 0935 11/16/18 1840  AST 18 37  ALT 16 25  ALKPHOS 79 89  BILITOT 0.3 0.7  PROT 6.5 6.9  ALBUMIN 2.7* 3.0*   No results for input(s): LIPASE, AMYLASE in the last 168 hours. No results for input(s): AMMONIA in the last 168 hours. Coagulation Profile: No results for input(s): INR, PROTIME in the last 168 hours. Cardiac Enzymes: No results for input(s): CKTOTAL, CKMB, CKMBINDEX, TROPONINI in the last 168 hours. BNP (last 3 results) No results for input(s): PROBNP in the last 8760 hours. HbA1C: Recent Labs    11/17/18 0031  HGBA1C 6.1*   CBG: No  results for input(s): GLUCAP in the last 168 hours. Lipid Profile: Recent Labs    11/17/18 0031  CHOL 165  HDL 36*  LDLCALC 103*  TRIG 132  CHOLHDL 4.6   Thyroid Function Tests: Recent Labs    11/16/18 1840  TSH 2.545   Anemia Panel: No results for input(s): VITAMINB12, FOLATE, FERRITIN, TIBC, IRON, RETICCTPCT in the last 72 hours. Sepsis Labs: Recent Labs  Lab 11/16/18 1840 11/17/18 0030  LATICACIDVEN 1.8 1.6    Recent Results (from the past 240 hour(s))  SARS Coronavirus 2 by RT PCR (hospital order, performed in [facility] hospital lab) Nasopharyngeal Nasopharyngeal Swab     Status: None   Collection Time: 11/16/18  7:37 PM   Specimen: Nasopharyngeal Swab  Result Value Ref Range Status   SARS Coronavirus 2 NEGATIVE NEGATIVE Final    Comment: (NOTE) If result is NEGATIVE SARS-CoV-2 target nucleic acids are NOT DETECTED. The SARS-CoV-2 RNA is generally detectable in upper and lower  respiratory specimens during the acute phase of infection. The lowest  concentration of SARS-CoV-2 viral copies this assay can detect is 250  copies / mL. A negative result does not preclude SARS-CoV-2 infection  and should not be used as the sole basis for treatment or other  patient management decisions.  A negative result may occur with  improper specimen collection / handling, submission of specimen other  than nasopharyngeal swab, presence of viral mutation(s) within the  areas targeted by this assay, and inadequate number of viral copies  (<250 copies / mL). A negative result must be combined with clinical  observations, patient history, and epidemiological information. If result is POSITIVE SARS-CoV-2 target nucleic acids are DETECTED. The SARS-CoV-2 RNA is generally detectable in upper and lower  respiratory specimens dur ing the acute phase of infection.  Positive  results are indicative of active infection with SARS-CoV-2.  Clinical  correlation with patient history and  other diagnostic information is  necessary to determine patient infection status.  Positive results do  not rule out bacterial infection or co-infection with other viruses. If result is PRESUMPTIVE POSTIVE SARS-CoV-2 nucleic acids MAY BE PRESENT.   A presumptive positive result was obtained  on the submitted specimen  and confirmed on repeat testing.  While 2019 novel coronavirus  (SARS-CoV-2) nucleic acids may be present in the submitted sample  additional confirmatory testing may be necessary for epidemiological  and / or clinical management purposes  to differentiate between  SARS-CoV-2 and other Sarbecovirus currently known to infect humans.  If clinically indicated additional testing with an alternate test  methodology [phone]) is advised. The SARS-CoV-2 RNA is generally  detectable in upper and lower respiratory sp ecimens during the acute  phase of infection. The expected result is Negative. Fact Sheet for Patients:  StrictlyIdeas.no Fact Sheet for Healthcare Providers: BankingDealers.co.za This test is not yet approved or cleared by the [place] FDA and has been authorized for detection and/or diagnosis of SARS-CoV-2 by FDA under an Emergency Use Authorization (EUA).  This EUA will remain in effect (meaning this test can be used) for the duration of the COVID-19 declaration under Section 564(b)(1) of the Act, 21 U.S.C. section 360bbb-3(b)(1), unless the authorization is terminated or revoked sooner. Performed at [facility], [place] [address], [place], [place] [address]   MRSA PCR Screening     Status: None   Collection Time: 11/17/18 12:15 AM   Specimen: Nasal Mucosa; Nasopharyngeal  Result Value Ref Range Status   MRSA by PCR NEGATIVE NEGATIVE Final    Comment:        The GeneXpert MRSA Assay (FDA approved for NASAL specimens only), is one component of a comprehensive MRSA colonization surveillance program. It is  not intended to diagnose MRSA infection nor to guide or monitor treatment for MRSA infections. Performed at [facility], [place] [address], [place], [place] [address]   Culture, blood (Routine X 2) w Reflex to ID Panel     Status: None (Preliminary result)   Collection Time: 11/17/18 12:31 AM   Specimen: BLOOD  Result Value Ref Range Status   Specimen Description BLOOD LEFT ANTECUBITAL  Final   Special Requests   Final    BOTTLES DRAWN AEROBIC AND ANAEROBIC Blood Culture adequate volume   Culture   Final    NO GROWTH 1 DAY Performed at [facility], [place] [address], [place], [place] [address]    Report Status PENDING  Incomplete  Culture, blood (Routine X 2) w Reflex to ID Panel     Status: None (Preliminary result)   Collection Time: 11/17/18 12:31 AM   Specimen: BLOOD  Result Value Ref Range Status   Specimen Description BLOOD LEFT ARM  Final   Special Requests   Final    BOTTLES DRAWN AEROBIC AND ANAEROBIC Blood Culture results may not be optimal due to an inadequate volume of blood received in culture bottles   Culture   Final    NO GROWTH 1 DAY Performed at [facility], [place] [address] [place], [place] [address]    Report Status PENDING  Incomplete         Radiology Studies: Dg Chest Port 1 View  Result Date: 11/18/2018 CLINICAL DATA:  Shortness of breath.  Weakness.  Pleural effusion. EXAM: PORTABLE CHEST 1 VIEW COMPARISON:  11/17/2018. FINDINGS: Left PleurX catheter in stable position. Cardiomegaly with pulmonary venous congestion again noted without interim change. Low lung volumes. Bibasilar infiltrates/edema again noted. Small unchanged bilateral pleural effusions again noted. No pneumothorax. IMPRESSION: 1.  Left PleurX catheter in stable position.  No pneumothorax. 2. Cardiomegaly with pulmonary venous congestion again noted without interim change. 3. Low lung volumes.  Bibasilar and infiltrates/edema again noted. Small unchanged bilateral pleural  effusions again noted. Electronically Signed   By: Marcello Moores  Register   On: 11/18/2018 06:44        Scheduled Meds: . abemaciclib  50 mg Oral BID  . apixaban  5 mg Oral BID  . doxycycline  100 mg Oral Q12H  . feeding supplement (ENSURE ENLIVE)  237 mL Oral BID BM  . furosemide  40 mg Intravenous Daily  . ipratropium-albuterol  3 mL Nebulization BID  . letrozole  2.5 mg Oral Daily  . metoprolol tartrate  5 mg Intravenous Once  . metoprolol tartrate  50 mg Oral BID  . potassium chloride  20 mEq Oral Once  . potassium chloride  20 mEq Oral Once  . sodium chloride flush  3 mL Intravenous Q12H  . Vitamin D (Ergocalciferol)  50,000 Units Oral Q Mon   Continuous Infusions: . sodium chloride    . magnesium sulfate bolus IVPB       LOS: 3 days    Time spent: 35 minutes With over half the time coordinating the patient's care    Harold Hedge, DO [facility] Pager [phone]  If 7PM-7AM, please contact night-coverage [email or web address] Password [email or web address] 11/19/2018, 7:20 AM

## 2018-11-20 ENCOUNTER — Telehealth: Payer: Self-pay | Admitting: *Deleted

## 2018-11-20 LAB — BASIC METABOLIC PANEL
Anion gap: 10 (ref 5–15)
BUN: 22 mg/dL (ref 8–23)
CO2: 36 mmol/L — ABNORMAL HIGH (ref 22–32)
Calcium: 8.7 mg/dL — ABNORMAL LOW (ref 8.9–10.3)
Chloride: 92 mmol/L — ABNORMAL LOW (ref 98–111)
Creatinine, Ser: 1.38 mg/dL — ABNORMAL HIGH (ref 0.44–1.00)
GFR calc Af Amer: 42 mL/min — ABNORMAL LOW (ref >60)
GFR calc non Af Amer: 36 mL/min — ABNORMAL LOW (ref >60)
Glucose, Bld: 109 mg/dL — ABNORMAL HIGH (ref 70–99)
Potassium: 3.9 mmol/L (ref 3.5–5.1)
Sodium: 138 mmol/L (ref 135–145)

## 2018-11-20 MED ORDER — CEFDINIR 300 MG PO CAPS
300.0000 mg | ORAL_CAPSULE | Freq: Two times a day (BID) | ORAL | Status: AC
  Administered 2018-11-20 – 2018-11-23 (×7): 300 mg via ORAL
  Filled 2018-11-20 (×8): qty 1

## 2018-11-20 NOTE — NC FL2 (Deleted)
[place] LEVEL OF CARE SCREENING TOOL     IDENTIFICATION  Patient Name: Beverly Tucker Birthdate: 07-10-1940 Sex: female Admission Date (Current Location): 11/16/2018  [facility] and [place] Number:  Herbalist and Address:  [facility], [place] [address] [place], [place] [address]      Provider Number: [id number]  Attending Physician Name and Address:  Dessa Phi, DO  Relative Name and Phone Number:  Analissa Hirzel (son) [phone]    Current Level of Care: Hospital Recommended Level of Care: [place] Prior Approval Number:    Date Approved/Denied:   PASRR Number: [id number]  Discharge Plan: SNF    Current Diagnoses: Patient Active Problem List   Diagnosis Date Noted  . Malnutrition of moderate degree 11/18/2018  . SVT (supraventricular tachycardia) ([place]) 11/16/2018  . Acute on chronic diastolic CHF (congestive heart failure) ([place]) 11/16/2018  . Elevated troponin 11/16/2018  . Leukocytosis 11/16/2018  . Atrial fibrillation, chronic ([place]) 11/16/2018  . Malignant pleural effusion   . 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 11/06/2018  . AKI (acute kidney injury) ([place]) 11/06/2018  . AF (paroxysmal atrial fibrillation) ([place]) 11/06/2018  . Pleural effusion 11/06/2018  . Bone metastases ([place]) 11/04/2018  . Metastatic breast cancer ([place])   . Pneumothorax   . Gram-positive bacteremia   . Mass of breast, right 10/15/2018  . Acute CHF (congestive heart failure) ([place]) 10/15/2018  . Shortness of breath 10/14/2018  . CKD (chronic kidney disease) stage 3, GFR 30-59 ml/min 02/06/2018  . HTN (hypertension) 12/01/2015    Orientation RESPIRATION BLADDER Height & Weight     Self, Time, Situation, Place  O2(3 liters) Continent Weight: 80.2 kg Height:  5\' 6"  (167.6 cm)  BEHAVIORAL SYMPTOMS/MOOD NEUROLOGICAL BOWEL NUTRITION STATUS      Continent Diet(see DC summary)  AMBULATORY STATUS COMMUNICATION OF NEEDS Skin    Limited Assist Verbally Surgical wounds(lower abd breast incision, wet to dry once per shift)                       Personal Care Assistance Level of Assistance  Bathing, Feeding, Dressing Bathing Assistance: Limited assistance Feeding assistance: Independent Dressing Assistance: Limited assistance     Functional Limitations Info  Sight, Hearing, Speech Sight Info: Adequate Hearing Info: Adequate Speech Info: Adequate    SPECIAL CARE FACTORS FREQUENCY  PT (By licensed PT), OT (By licensed OT)     PT Frequency: 5x/week OT Frequency: 5x/week            Contractures Contractures Info: Not present    Additional Factors Info  Code Status, Allergies Code Status Info: Full Allergies Info: NKA           Current Medications (11/20/2018):  This is the current hospital active medication list Current Facility-Administered Medications  Medication Dose Route Frequency Provider Last Rate Last Dose  . 0.9 %  sodium chloride infusion  250 mL Intravenous PRN Ivor Costa, MD      . abemaciclib (VERZENIO) tablet 50 mg  50 mg Oral BID Ivor Costa, MD   50 mg at 11/20/18 0947  . acetaminophen (TYLENOL) tablet 650 mg  650 mg Oral Q4H PRN Ivor Costa, MD      . ALPRAZolam Duanne Moron) tablet 0.25-0.5 mg  0.25-0.5 mg Oral BID PRN Ivor Costa, MD      . apixaban Arne Cleveland) tablet 5 mg  5 mg Oral BID Lyndee Leo, RPH   5 mg at  11/20/18 0815  . cefTRIAXone (ROCEPHIN) 1 g in sodium chloride 0.9 % 100 mL IVPB  1 g Intravenous Q24H Harold Hedge, MD 200 mL/hr at 11/19/18 2020 1 g at 11/19/18 2020  . dextromethorphan-guaiFENesin (MUCINEX DM) 30-600 MG per 12 hr tablet 1 tablet  1 tablet Oral BID Harold Hedge, MD   1 tablet at 11/20/18 0815  . doxycycline (VIBRA-TABS) tablet 100 mg  100 mg Oral Q12H Harold Hedge, MD   100 mg at 11/20/18 0815  . feeding supplement (ENSURE ENLIVE) (ENSURE ENLIVE) liquid 237 mL  237 mL Oral BID BM Ivor Costa, MD   237 mL at 11/19/18 1530  . furosemide (LASIX)  tablet 40 mg  40 mg Oral Daily Dorothy Spark, MD   40 mg at 11/20/18 0815  . hydrALAZINE (APRESOLINE) tablet 25 mg  25 mg Oral TID PRN Ivor Costa, MD      . letrozole [facility]) tablet 2.5 mg  2.5 mg Oral Daily Ivor Costa, MD   2.5 mg at 11/20/18 0857  . loperamide (IMODIUM) capsule 2 mg  2 mg Oral PRN Ivor Costa, MD   2 mg at 11/18/18 1000  . metoprolol tartrate (LOPRESSOR) injection 5 mg  5 mg Intravenous Once Vertis Kelch, NP   Stopped at 11/17/18 0126  . metoprolol tartrate (LOPRESSOR) tablet 50 mg  50 mg Oral BID Harold Hedge, MD   50 mg at 11/20/18 0815  . nitroGLYCERIN (NITROSTAT) SL tablet 0.4 mg  0.4 mg Sublingual Q5 min PRN Ivor Costa, MD      . ondansetron [facility]) injection 4 mg  4 mg Intravenous Q6H PRN Ivor Costa, MD   4 mg at 11/20/18 0813  . oxyCODONE-acetaminophen (PERCOCET) 7.5-325 MG per tablet 1 tablet  1 tablet Oral Q4H PRN Ivor Costa, MD      . sodium chloride flush (NS) 0.9 % injection 3 mL  3 mL Intravenous Q12H Ivor Costa, MD   3 mL at 11/19/18 2150  . sodium chloride flush (NS) 0.9 % injection 3 mL  3 mL Intravenous PRN Ivor Costa, MD      . Vitamin D (Ergocalciferol) (DRISDOL) capsule 50,000 Units  50,000 Units Oral Q Cristopher Peru, MD   50,000 Units at 11/18/18 [id number]     Discharge Medications: Please see discharge summary for a list of discharge medications.  Relevant Imaging Results:  Relevant Lab Results:   Additional Information [phone]  takes chemo med abemaciclib 50mg  bid, she has with her  Zenon Mayo, RN

## 2018-11-20 NOTE — TOC Progression Note (Addendum)
Transition of Care [facility]) - Progression Note    Patient Details  Name: Beverly Tucker MRN: [id number] Date of Birth: 1940/02/09  [facility] CM/SW Contact  Zenon Mayo, RN Phone Number: 11/20/2018, 2:38 PM  Clinical Narrative:    NCM spoke with patient and she is agreeable to go to SNF at discharge, she states NCM can fax out to SNF's to get bed offers.  Also she gave NCM permission to speak with her son [name].  NCM contacted [facility] which manages patient insurance, faxed information to them to get authorization for SNF.  NCM will give bed offers to patient.  NCM received call from patient son [name], [place] gave him bed offers for [facility], [facility] and [facility].  He states he will check with his wife and call NCM in the am. He also would like NCM to check with [facility] since they are close to them.  NCM contacted Wendi at [employer] she state she will take a look and let me know.  10/22- NCM received call from Beacher May with [facility], wanted more information stating they will  Have to send to medical Director because she did so well with therapy.  She will call  NCM back to let me know if we need to do a peer to peer.    Expected Discharge Plan: Skilled Nursing Facility Barriers to Discharge: No Barriers Identified  Expected Discharge Plan and Services Expected Discharge Plan: [place] In-house Referral: NA Discharge Planning Services: CM Consult Post Acute Care Choice: [place] Living arrangements for the past 2 months: Single Family Home                 DME Arranged: (NA)         HH Arranged: NA           Social Determinants of Health (SDOH) Interventions    Readmission Risk Interventions Readmission Risk Prevention Plan 10/23/2018  Transportation Screening Complete  PCP or Specialist Appt within 5-7 Days Complete  Home Care Screening Complete  Medication Review (RN CM) Complete  Some recent data  might be hidden

## 2018-11-20 NOTE — NC FL2 (Signed)
[place] LEVEL OF CARE SCREENING TOOL     IDENTIFICATION  Patient Name: Beverly Tucker Birthdate: 31-Aug-1940 Sex: female Admission Date (Current Location): 11/16/2018  [facility] and [place] Number:  Herbalist and Address:  [facility], [place] [address], [place], [place] [address]      Provider Number: [id number]  Attending Physician Name and Address:  Dessa Phi, DO  Relative Name and Phone Number:  Currie Brungard (son) [phone]    Current Level of Care: Hospital Recommended Level of Care: [place] Prior Approval Number:    Date Approved/Denied:   PASRR Number: [id number]  Discharge Plan: SNF    Current Diagnoses: Patient Active Problem List   Diagnosis Date Noted  . Malnutrition of moderate degree 11/18/2018  . SVT (supraventricular tachycardia) ([place]) 11/16/2018  . Acute on chronic diastolic CHF (congestive heart failure) ([place]) 11/16/2018  . Elevated troponin 11/16/2018  . Leukocytosis 11/16/2018  . Atrial fibrillation, chronic ([place]) 11/16/2018  . Malignant pleural effusion   . 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 11/06/2018  . AKI (acute kidney injury) ([place]) 11/06/2018  . AF (paroxysmal atrial fibrillation) ([place]) 11/06/2018  . Pleural effusion 11/06/2018  . Bone metastases ([place]) 11/04/2018  . Metastatic breast cancer ([place])   . Pneumothorax   . Gram-positive bacteremia   . Mass of breast, right 10/15/2018  . Acute CHF (congestive heart failure) ([place]) 10/15/2018  . Shortness of breath 10/14/2018  . CKD (chronic kidney disease) stage 3, GFR 30-59 ml/min 02/06/2018  . HTN (hypertension) 12/01/2015    Orientation RESPIRATION BLADDER Height & Weight     Self, Time, Situation, Place  O2(3 liters) Continent Weight: 80.2 kg Height:  5\' 6"  (167.6 cm)  BEHAVIORAL SYMPTOMS/MOOD NEUROLOGICAL BOWEL NUTRITION STATUS      Continent Diet(see DC summary)  AMBULATORY STATUS COMMUNICATION OF NEEDS Skin    Limited Assist Verbally Surgical wounds, Other (Comment)(lower abd breast incision, wet to dry once per shift, she has pluerx drains which needs to be drained every other day)                       Personal Care Assistance Level of Assistance  Bathing, Feeding, Dressing Bathing Assistance: Limited assistance Feeding assistance: Independent Dressing Assistance: Limited assistance     Functional Limitations Info  Sight, Hearing, Speech Sight Info: Adequate Hearing Info: Adequate Speech Info: Adequate    SPECIAL CARE FACTORS FREQUENCY  PT (By licensed PT), OT (By licensed OT)     PT Frequency: 5x/week OT Frequency: 5x/week            Contractures Contractures Info: Not present    Additional Factors Info  Code Status, Allergies Code Status Info: Full Allergies Info: NKA           Current Medications (11/20/2018):  This is the current hospital active medication list Current Facility-Administered Medications  Medication Dose Route Frequency Provider Last Rate Last Dose  . 0.9 %  sodium chloride infusion  250 mL Intravenous PRN Ivor Costa, MD      . abemaciclib (VERZENIO) tablet 50 mg  50 mg Oral BID Ivor Costa, MD   50 mg at 11/20/18 0947  . acetaminophen (TYLENOL) tablet 650 mg  650 mg Oral Q4H PRN Ivor Costa, MD      . ALPRAZolam Duanne Moron) tablet 0.25-0.5 mg  0.25-0.5 mg Oral BID PRN Ivor Costa, MD      . apixaban Arne Cleveland) tablet 5 mg  5 mg Oral BID Lyndee Leo, RPH   5 mg at 11/20/18 [id number]  . cefTRIAXone (ROCEPHIN) 1 g in sodium chloride 0.9 % 100 mL IVPB  1 g Intravenous Q24H Harold Hedge, MD 200 mL/hr at 11/19/18 2020 1 g at 11/19/18 2020  . dextromethorphan-guaiFENesin (MUCINEX DM) 30-600 MG per 12 hr tablet 1 tablet  1 tablet Oral BID Harold Hedge, MD   1 tablet at 11/20/18 0815  . doxycycline (VIBRA-TABS) tablet 100 mg  100 mg Oral Q12H Harold Hedge, MD   100 mg at 11/20/18 0815  . feeding supplement (ENSURE ENLIVE) (ENSURE ENLIVE) liquid 237 mL  237  mL Oral BID BM Ivor Costa, MD   237 mL at 11/19/18 1530  . furosemide (LASIX) tablet 40 mg  40 mg Oral Daily Dorothy Spark, MD   40 mg at 11/20/18 0815  . hydrALAZINE (APRESOLINE) tablet 25 mg  25 mg Oral TID PRN Ivor Costa, MD      . letrozole [facility]) tablet 2.5 mg  2.5 mg Oral Daily Ivor Costa, MD   2.5 mg at 11/20/18 0857  . loperamide (IMODIUM) capsule 2 mg  2 mg Oral PRN Ivor Costa, MD   2 mg at 11/18/18 1000  . metoprolol tartrate (LOPRESSOR) injection 5 mg  5 mg Intravenous Once Vertis Kelch, NP   Stopped at 11/17/18 0126  . metoprolol tartrate (LOPRESSOR) tablet 50 mg  50 mg Oral BID Harold Hedge, MD   50 mg at 11/20/18 0815  . nitroGLYCERIN (NITROSTAT) SL tablet 0.4 mg  0.4 mg Sublingual Q5 min PRN Ivor Costa, MD      . ondansetron [facility]) injection 4 mg  4 mg Intravenous Q6H PRN Ivor Costa, MD   4 mg at 11/20/18 0813  . oxyCODONE-acetaminophen (PERCOCET) 7.5-325 MG per tablet 1 tablet  1 tablet Oral Q4H PRN Ivor Costa, MD      . sodium chloride flush (NS) 0.9 % injection 3 mL  3 mL Intravenous Q12H Ivor Costa, MD   3 mL at 11/19/18 2150  . sodium chloride flush (NS) 0.9 % injection 3 mL  3 mL Intravenous PRN Ivor Costa, MD      . Vitamin D (Ergocalciferol) (DRISDOL) capsule 50,000 Units  50,000 Units Oral Q Cristopher Peru, MD   50,000 Units at 11/18/18 [id number]     Discharge Medications: Please see discharge summary for a list of discharge medications.  Relevant Imaging Results:  Relevant Lab Results:   Additional Information [phone]  takes chemo med abemaciclib 50mg  bid, she has with her  Zenon Mayo, RN

## 2018-11-20 NOTE — Progress Notes (Signed)
PROGRESS NOTE    Beverly Tucker  [id number] DOB: 28-Oct-1940 DOA: 11/16/2018 PCP: [name], MD     Brief Narrative:  Beverly Tucker is a 78 year old female with past medical history of hypertension, GERD, anxiety, metastatic right breast cancer recently started on abemaciclib on 10/12 and on letrozole (follows with Dr. Roney Marion appointment 10/5), recurrent malignant pleural effusion with Pleurx, chronic diastolic heart failure, atrial fibrillation on Eliquis, SVT, pneumothorax, CKD 3 who presented to the ED on 10/17 with 1 week of worsening shortness of breath, leg edema and palpitations.  In the ED, patient was found to have SVT with heart rate up to 180s and was given 6 mg of adenosine with conversion to NSR.  She was found to have presentation troponin 32, WBC 13.7, BNP 183, lactic acid 1.8, Covid negative.  Chest x-ray showed small bilateral pleural effusions and somewhat new hazy opacity and started on empiric azithromycin.  She was admitted for acute on chronic diastolic heart failure exacerbation received IV diuresis.  Overnight 10/17-10/18 patient had a recurrence of SVT which continuously converted to NSR without any medical intervention.   New events last 24 hours / Subjective: Feeling well this morning overall.  States that she had a piece of toast, felt some nausea afterward without vomiting.  Otherwise doing well without any chest pain or shortness of breath.  No further episodes of SVT.  Agreeable for SNF placement  Assessment & Plan:   Principal Problem:   SVT (supraventricular tachycardia) (HCC) Active Problems:   CKD (chronic kidney disease) stage 3, GFR 30-59 ml/min   HTN (hypertension)   Metastatic breast cancer (HCC)   Pleural effusion   Acute on chronic diastolic CHF (congestive heart failure) (HCC)   Elevated troponin   Leukocytosis   Atrial fibrillation, chronic (HCC)   Malnutrition of moderate degree   Acute hypoxic respiratory failure secondary to  decompensated heart failure  -Continue to wean nasal cannula O2 to room air as able -Continue diuresis   SVT  -Status post adenosine 6 mg x 1 in ED with conversion to NSR and again with brief recurrence of SVT which spontaneously converted to NSR 10/19. Again with recurrence of hemodynamically stable SVT 10/20 during BM prior to receiving her Lopressor -Continue beta-blocker -No further events last 24 hours  Atrial fibrillation with RVR  -CHA2DS2-VASc: 5. On Eliquis.  -Continue metoprolol 50 mg p.o. twice daily -Continue Eliquis  Acute on chronic diastolic heart failure in setting of Afib with RVR  -Last echo (10/15/18) EF 65 -70%, EF 55 -60% this admission -Continue Lasix, beta-blocker -Appreciate cardiology  Elevated troponin  -Troponin 32 which peaked at 49.  Suspect demand ischemia in setting of heart failure and SVT.    AKI on CKD 3a  -Cr 1.19->1.39->1.59->1.38, suspect cardio renal.  Improving  Hypertension  -Stable  Metastatic breast cancer -Follows with Dr. [name], Oncology. On letrozole and abemaciclib (recently started abemaciclib 10/12). Has outpatient follow-up 10/22. Will be started on Zometa outpatient for bone mets  Recurrent malignant pleural effusion  -Pleurx drain placed 10/9. Drained 200 cc overnight.    Leukocytosis  -Resolved.  Afebrile. Azythromycin added on admission for empiric coverage which was switched to doxycycline 10/19.  Shortness of breath and cough is likely due to acute on chronic heart failure, although she does have a persistent infiltrate on xray and not feeling well.  -Currently on Rocephin and doxycycline    DVT prophylaxis: Eliquis Code Status: Full code Family Communication: None at bedside Disposition  Plan: SNF placement pending   Consultants:   Cardiology   Oncology   Procedures:   None   Antimicrobials:  Anti-infectives (From admission, onward)   Start     Dose/Rate Route Frequency Ordered Stop   11/19/18 1930   cefTRIAXone (ROCEPHIN) 1 g in sodium chloride 0.9 % 100 mL IVPB     1 g 200 mL/hr over 30 Minutes Intravenous Every 24 hours 11/19/18 1840 11/24/18 1929   11/18/18 1000  azithromycin (ZITHROMAX) tablet 250 mg  Status:  Discontinued     250 mg Oral Daily 11/17/18 0007 11/18/18 0900   11/18/18 1000  doxycycline (VIBRA-TABS) tablet 100 mg     100 mg Oral Every 12 hours 11/18/18 0900 11/22/18 0959   11/17/18 1000  azithromycin (ZITHROMAX) tablet 500 mg     500 mg Oral Daily 11/17/18 0007 11/17/18 0939       Objective: Vitals:   11/20/18 0537 11/20/18 0732 11/20/18 0734 11/20/18 0900  BP:  (!) 133/48 (!) 133/54   Pulse:  96  78  Resp:  (!) 27  (!) 22  Temp:  98 F (36.7 C)    TempSrc:  Oral    SpO2:    98%  Weight: 80.2 kg     Height:        Intake/Output Summary (Last 24 hours) at 11/20/2018 1114 Last data filed at 11/20/2018 0500 Gross per 24 hour  Intake 220 ml  Output -  Net 220 ml   Filed Weights   11/19/18 0630 11/20/18 0430 11/20/18 0537  Weight: 79.8 kg 80.9 kg 80.2 kg    Examination:  General exam: Appears calm and comfortable  Respiratory system: Diminished breath sounds without any respiratory distress Cardiovascular system: S1 & S2 heard, RRR. No murmurs. No pedal edema. Gastrointestinal system: Abdomen is nondistended, soft and nontender. Normal bowel sounds heard. Central nervous system: Alert and oriented. No focal neurological deficits. Speech clear.  Extremities: Symmetric in appearance  Skin: No rashes, lesions or ulcers on exposed skin  Psychiatry: Judgement and insight appear normal. Mood & affect appropriate.   Data Reviewed: I have personally reviewed following labs and imaging studies  CBC: Recent Labs  Lab 11/16/18 1840 11/17/18 0031 11/18/18 0322 11/19/18 0227  WBC 13.7* 11.9* 9.7 8.5  HGB 11.3* 9.9* 10.2* 10.0*  HCT 37.5 32.3* 33.0* 32.2*  MCV 95.7 94.7 93.0 92.5  PLT 462* 409* 374 0000000   Basic Metabolic Panel: Recent Labs  Lab  11/16/18 1840 11/17/18 0031 11/18/18 0322 11/18/18 1426 11/19/18 0227 11/20/18 0251  NA 138 141 141 135 139 138  K 4.7 3.9 4.1 4.0 3.4* 3.9  CL 98 99 95* 89* 90* 92*  CO2 27 31 36* 32 36* 36*  GLUCOSE 145* 114* 121* 162* 120* 109*  BUN 15 12 15 15 18 22   CREATININE 1.29* 1.19* 1.39* 1.48* 1.59* 1.38*  CALCIUM 9.1 8.8* 8.5* 9.0 8.6* 8.7*  MG 2.3 2.0 1.9 1.8 1.8  --    GFR: Estimated Creatinine Clearance: 35.3 mL/min (A) (by C-G formula based on SCr of 1.38 mg/dL (H)). Liver Function Tests: Recent Labs  Lab 11/16/18 1840  AST 37  ALT 25  ALKPHOS 89  BILITOT 0.7  PROT 6.9  ALBUMIN 3.0*   No results for input(s): LIPASE, AMYLASE in the last 168 hours. No results for input(s): AMMONIA in the last 168 hours. Coagulation Profile: No results for input(s): INR, PROTIME in the last 168 hours. Cardiac Enzymes: No results for input(s): CKTOTAL, CKMB,  CKMBINDEX, TROPONINI in the last 168 hours. BNP (last 3 results) No results for input(s): PROBNP in the last 8760 hours. HbA1C: No results for input(s): HGBA1C in the last 72 hours. CBG: No results for input(s): GLUCAP in the last 168 hours. Lipid Profile: No results for input(s): CHOL, HDL, LDLCALC, TRIG, CHOLHDL, LDLDIRECT in the last 72 hours. Thyroid Function Tests: No results for input(s): TSH, T4TOTAL, FREET4, T3FREE, THYROIDAB in the last 72 hours. Anemia Panel: No results for input(s): VITAMINB12, FOLATE, FERRITIN, TIBC, IRON, RETICCTPCT in the last 72 hours. Sepsis Labs: Recent Labs  Lab 11/16/18 1840 11/17/18 0030  LATICACIDVEN 1.8 1.6    Recent Results (from the past 240 hour(s))  SARS Coronavirus 2 by RT PCR (hospital order, performed in [facility] hospital lab) Nasopharyngeal Nasopharyngeal Swab     Status: None   Collection Time: 11/16/18  7:37 PM   Specimen: Nasopharyngeal Swab  Result Value Ref Range Status   SARS Coronavirus 2 NEGATIVE NEGATIVE Final    Comment: (NOTE) If result is NEGATIVE SARS-CoV-2  target nucleic acids are NOT DETECTED. The SARS-CoV-2 RNA is generally detectable in upper and lower  respiratory specimens during the acute phase of infection. The lowest  concentration of SARS-CoV-2 viral copies this assay can detect is 250  copies / mL. A negative result does not preclude SARS-CoV-2 infection  and should not be used as the sole basis for treatment or other  patient management decisions.  A negative result may occur with  improper specimen collection / handling, submission of specimen other  than nasopharyngeal swab, presence of viral mutation(s) within the  areas targeted by this assay, and inadequate number of viral copies  (<250 copies / mL). A negative result must be combined with clinical  observations, patient history, and epidemiological information. If result is POSITIVE SARS-CoV-2 target nucleic acids are DETECTED. The SARS-CoV-2 RNA is generally detectable in upper and lower  respiratory specimens dur ing the acute phase of infection.  Positive  results are indicative of active infection with SARS-CoV-2.  Clinical  correlation with patient history and other diagnostic information is  necessary to determine patient infection status.  Positive results do  not rule out bacterial infection or co-infection with other viruses. If result is PRESUMPTIVE POSTIVE SARS-CoV-2 nucleic acids MAY BE PRESENT.   A presumptive positive result was obtained on the submitted specimen  and confirmed on repeat testing.  While 2019 novel coronavirus  (SARS-CoV-2) nucleic acids may be present in the submitted sample  additional confirmatory testing may be necessary for epidemiological  and / or clinical management purposes  to differentiate between  SARS-CoV-2 and other Sarbecovirus currently known to infect humans.  If clinically indicated additional testing with an alternate test  methodology [phone]) is advised. The SARS-CoV-2 RNA is generally  detectable in upper and lower  respiratory sp ecimens during the acute  phase of infection. The expected result is Negative. Fact Sheet for Patients:  StrictlyIdeas.no Fact Sheet for Healthcare Providers: BankingDealers.co.za This test is not yet approved or cleared by the [place] FDA and has been authorized for detection and/or diagnosis of SARS-CoV-2 by FDA under an Emergency Use Authorization (EUA).  This EUA will remain in effect (meaning this test can be used) for the duration of the COVID-19 declaration under Section 564(b)(1) of the Act, 21 U.S.C. section 360bbb-3(b)(1), unless the authorization is terminated or revoked sooner. Performed at [facility], [place] [address], [place], [place] [address]   MRSA PCR Screening  Status: None   Collection Time: 11/17/18 12:15 AM   Specimen: Nasal Mucosa; Nasopharyngeal  Result Value Ref Range Status   MRSA by PCR NEGATIVE NEGATIVE Final    Comment:        The GeneXpert MRSA Assay (FDA approved for NASAL specimens only), is one component of a comprehensive MRSA colonization surveillance program. It is not intended to diagnose MRSA infection nor to guide or monitor treatment for MRSA infections. Performed at [facility], [place] [address] [place], [place] [address]   Culture, blood (Routine X 2) w Reflex to ID Panel     Status: None (Preliminary result)   Collection Time: 11/17/18 12:31 AM   Specimen: BLOOD  Result Value Ref Range Status   Specimen Description BLOOD LEFT ANTECUBITAL  Final   Special Requests   Final    BOTTLES DRAWN AEROBIC AND ANAEROBIC Blood Culture adequate volume   Culture   Final    NO GROWTH 3 DAYS Performed at [facility], [place] [address] [place], [place] [address]    Report Status PENDING  Incomplete  Culture, blood (Routine X 2) w Reflex to ID Panel     Status: None (Preliminary result)   Collection Time: 11/17/18 12:31 AM   Specimen: BLOOD  Result Value Ref  Range Status   Specimen Description BLOOD LEFT ARM  Final   Special Requests   Final    BOTTLES DRAWN AEROBIC AND ANAEROBIC Blood Culture results may not be optimal due to an inadequate volume of blood received in culture bottles   Culture   Final    NO GROWTH 3 DAYS Performed at [facility], [place] [address] [place], [place] [address]    Report Status PENDING  Incomplete      Radiology Studies: Dg Chest Port 1 View  Result Date: 11/19/2018 CLINICAL DATA:  Cough EXAM: PORTABLE CHEST 1 VIEW COMPARISON:  Radiograph 11/18/2018, CTA 10/16/2018 FINDINGS: Stable position of a left PleurX catheter. No visible residual pneumothorax. Small right and moderate left pleural effusions are similar to comparison exam. Central venous congestion and cardiomegaly are unchanged from prior. Atherosclerotic calcification of the aortic arch is again noted. Some bandlike areas of opacity in the lung bases likely reflect atelectasis. Additional airspace opacities in the lung bases could reflect atelectasis or edema. No acute osseous or soft tissue abnormality. Degenerative changes are present in the imaged spine and shoulders. IMPRESSION: 1. Stable left PleurX catheter. No visible residual pneumothorax. 2. Unchanged small right and moderate left pleural effusions. 3. Unchanged cardiomegaly and central venous congestion. Bibasilar airspace opacities could reflect atelectasis or edema. Electronically Signed   By: Lovena Le M.D.   On: 11/19/2018 20:01      Scheduled Meds: . abemaciclib  50 mg Oral BID  . apixaban  5 mg Oral BID  . dextromethorphan-guaiFENesin  1 tablet Oral BID  . doxycycline  100 mg Oral Q12H  . feeding supplement (ENSURE ENLIVE)  237 mL Oral BID BM  . furosemide  40 mg Oral Daily  . letrozole  2.5 mg Oral Daily  . metoprolol tartrate  5 mg Intravenous Once  . metoprolol tartrate  50 mg Oral BID  . sodium chloride flush  3 mL Intravenous Q12H  . Vitamin D (Ergocalciferol)  50,000  Units Oral Q Mon   Continuous Infusions: . sodium chloride    . cefTRIAXone (ROCEPHIN)  IV 1 g (11/19/18 2020)     LOS: 4 days  Time spent: 40 minutes   Dessa Phi, DO [facility] 11/20/2018, 11:14 AM   Available via Epic secure chat 7am-7pm After these hours, please refer to coverage provider listed on [email or web address]

## 2018-11-20 NOTE — Progress Notes (Signed)
Occupational Therapy Treatment Patient Details Name: [name] MRN: [id number] DOB: December 21, 1940 Today's Date: 11/20/2018    History of present illness Is a 78 year old female with past medical history of hypertension, GERD, anxiety, metastatic right breast cancer with recurrent malignant pleural effusion with Pleurx recently placed last admission 0000000,  diastolic heart failure, atrial fibrillation on Eliquis, SVT, pneumothorax, CKD 3 admitted 10/17 with 1 week of worsening shortness of breath, leg edema and palpitations.  Treated for SVT in ED and had recurrence overnight day of admission which resolved spontaneouly..   OT comments  Patient supine in bed and agreeable to OT.  Patient continues to be limited by decreased activity tolerance, impaired balance, and generalized weakness. Requires min guard for transfers and mobility using RW.  Fatigues easily and will benefit from short term rehab in order to return to PLOF and be able to manage self care, mobility and IADLs at home alone.  Updated dc plan, will follow.    Follow Up Recommendations  SNF    Equipment Recommendations  None recommended by OT    Recommendations for Other Services      Precautions / Restrictions Precautions Precautions: Fall Precaution Comments: pleurx, L side Restrictions Weight Bearing Restrictions: No       Mobility Bed Mobility Overal bed mobility: Needs Assistance Bed Mobility: Supine to Sit;Sit to Supine     Supine to sit: Min guard Sit to supine: Supervision   General bed mobility comments: supervision for safety, no phyiscal assist required  Transfers Overall transfer level: Needs assistance Equipment used: Rolling walker (2 wheeled) Transfers: Sit to/from Stand Sit to Stand: Min guard         General transfer comment: min guard for safety/balance, cueing for hand placement using RW     Balance Overall balance assessment: Needs assistance Sitting-balance support: Feet  supported;No upper extremity supported Sitting balance-Leahy Scale: Good     Standing balance support: Bilateral upper extremity supported;During functional activity Standing balance-Leahy Scale: Fair Standing balance comment: min guard for dynamic standing balance,  relaint on BUE support                            ADL either performed or assessed with clinical judgement   ADL Overall ADL's : Needs assistance/impaired     Grooming: Min guard;Standing                   Toilet Transfer: Min guard;Ambulation;RW;BSC           Functional mobility during ADLs: Min guard;Rolling walker;Cueing for safety General ADL Comments: pt limited by decreased activity tolerance and generalized weakness      Vision       Perception     Praxis      Cognition Arousal/Alertness: Awake/alert Behavior During Therapy: WFL for tasks assessed/performed Overall Cognitive Status: Within Functional Limits for tasks assessed                                          Exercises     Shoulder Instructions       General Comments pt on 2L supplemental oxgyen via [place],  VSS    Pertinent Vitals/ Pain       Pain Assessment: No/denies pain  Home Living  Prior Functioning/Environment              Frequency  Min 2X/week        Progress Toward Goals  OT Goals(current goals can now be found in the care plan section)  Progress towards OT goals: Progressing toward goals  Acute Rehab OT Goals Patient Stated Goal: to get to rehab and get stronger OT Goal Formulation: With patient  Plan Discharge plan needs to be updated;Frequency remains appropriate    Co-evaluation                 AM-PAC OT "6 Clicks" Daily Activity     Outcome Measure   Help from another person eating meals?: None Help from another person taking care of personal grooming?: A Little Help from another person  toileting, which includes using toliet, bedpan, or urinal?: A Little Help from another person bathing (including washing, rinsing, drying)?: A Little Help from another person to put on and taking off regular upper body clothing?: A Little Help from another person to put on and taking off regular lower body clothing?: A Little 6 Click Score: 19    End of Session Equipment Utilized During Treatment: Oxygen;Rolling walker  OT Visit Diagnosis: Other abnormalities of gait and mobility (R26.89);Muscle weakness (generalized) (M62.81)   Activity Tolerance Patient tolerated treatment well   Patient Left in bed;with call bell/phone within reach;with bed alarm set   Nurse Communication Mobility status        Time: [id number] OT Time Calculation (min): 16 min  Charges: OT General Charges $OT Visit: 1 Visit OT Treatments $Self Care/Home Management : 8-22 mins  Delight Stare, [place] Pager [phone] Office [phone]    Delight Stare 11/20/2018, 3:10 PM

## 2018-11-20 NOTE — Progress Notes (Addendum)
HR 143, ST.  Lopressor po given as ordered at 2130.  Baltazar Najjar NP paged, notified of change.  Waiting call back.  Will continue to monitor.  EKG completed as ordered.  St with 1st degree av block, rate 142.  Return page to [place].

## 2018-11-20 NOTE — Plan of Care (Signed)
  Problem: Education: Goal: Ability to demonstrate management of disease process will improve Outcome: Progressing Goal: Ability to verbalize understanding of medication therapies will improve Outcome: Progressing   Problem: Cardiac: Goal: Ability to achieve and maintain adequate cardiopulmonary perfusion will improve Outcome: Progressing   Problem: Education: Goal: Knowledge of General Education information will improve Description: Including pain rating scale, medication(s)/side effects and non-pharmacologic comfort measures Outcome: Progressing   Problem: Clinical Measurements: Goal: Ability to maintain clinical measurements within normal limits will improve Outcome: Progressing   Problem: Coping: Goal: Level of anxiety will decrease Outcome: Progressing   Problem: Elimination: Goal: Will not experience complications related to bowel motility Outcome: Progressing Goal: Will not experience complications related to urinary retention Outcome: Progressing

## 2018-11-20 NOTE — Telephone Encounter (Signed)
Received faxed Telephone Advice fax from [facility] from son [name]: Patient needs to cancel appts on 10/22 for lab/MD and injection. She is currently in the hospital.  Dr.Kale given information - he asked to have lab/MD appts added to next injection appt on 11/2. Appts for 10/22 cancelled and schedule message sent to schedule as directed by Dr.Kale.   Attempted to contact [name] with Dr.Kale's advice. LVVM on named voice mail

## 2018-11-21 ENCOUNTER — Ambulatory Visit: Payer: Medicare HMO

## 2018-11-21 ENCOUNTER — Ambulatory Visit: Payer: Medicare HMO | Admitting: Hematology

## 2018-11-21 ENCOUNTER — Other Ambulatory Visit: Payer: Medicare HMO

## 2018-11-21 ENCOUNTER — Telehealth: Payer: Self-pay | Admitting: Hematology

## 2018-11-21 DIAGNOSIS — I10 Essential (primary) hypertension: Secondary | ICD-10-CM

## 2018-11-21 LAB — BASIC METABOLIC PANEL
Anion gap: 13 (ref 5–15)
BUN: 25 mg/dL — ABNORMAL HIGH (ref 8–23)
CO2: 33 mmol/L — ABNORMAL HIGH (ref 22–32)
Calcium: 9 mg/dL (ref 8.9–10.3)
Chloride: 91 mmol/L — ABNORMAL LOW (ref 98–111)
Creatinine, Ser: 1.6 mg/dL — ABNORMAL HIGH (ref 0.44–1.00)
GFR calc Af Amer: 35 mL/min — ABNORMAL LOW (ref >60)
GFR calc non Af Amer: 30 mL/min — ABNORMAL LOW (ref >60)
Glucose, Bld: 117 mg/dL — ABNORMAL HIGH (ref 70–99)
Potassium: 4.2 mmol/L (ref 3.5–5.1)
Sodium: 137 mmol/L (ref 135–145)

## 2018-11-21 MED ORDER — DIGOXIN 0.25 MG/ML IJ SOLN
0.1250 mg | Freq: Once | INTRAMUSCULAR | Status: AC
  Administered 2018-11-21: 01:00:00 0.25 mg via INTRAVENOUS
  Filled 2018-11-21: qty 2

## 2018-11-21 MED ORDER — DIGOXIN 0.25 MG/ML IJ SOLN
0.1250 mg | Freq: Once | INTRAMUSCULAR | Status: AC
  Administered 2018-11-21: 0.125 mg via INTRAVENOUS
  Filled 2018-11-21: qty 2

## 2018-11-21 MED ORDER — GERHARDT'S BUTT CREAM
1.0000 "application " | TOPICAL_CREAM | CUTANEOUS | Status: DC | PRN
  Filled 2018-11-21: qty 1

## 2018-11-21 MED ORDER — METOPROLOL TARTRATE 50 MG PO TABS
75.0000 mg | ORAL_TABLET | Freq: Two times a day (BID) | ORAL | Status: DC
  Administered 2018-11-21 – 2018-11-24 (×7): 75 mg via ORAL
  Filled 2018-11-21 (×7): qty 1

## 2018-11-21 MED ORDER — ADENOSINE 6 MG/2ML IV SOLN
6.0000 mg | Freq: Once | INTRAVENOUS | Status: AC
  Administered 2018-11-21: 6 mg via INTRAVENOUS
  Filled 2018-11-21: qty 2

## 2018-11-21 MED ORDER — SODIUM CHLORIDE 0.9 % IV BOLUS
250.0000 mL | Freq: Once | INTRAVENOUS | Status: AC
  Administered 2018-11-21: 01:00:00 250 mL via INTRAVENOUS

## 2018-11-21 MED ORDER — METOPROLOL TARTRATE 5 MG/5ML IV SOLN
5.0000 mg | Freq: Once | INTRAVENOUS | Status: AC
  Administered 2018-11-21: 5 mg via INTRAVENOUS

## 2018-11-21 NOTE — Significant Event (Signed)
Rapid Response Event Note  Overview:Called d/t SVT-150s(MD orders to give adenosine). Pt here for SVT, has been cardioverted with adenosine before in ED, and has had an episode of SVT that was responsive to IV metoprolol.  Time Called: 0410 Arrival Time: 0414 Event Type: Cardiac  Initial Focused Assessment: Pt laying in bed, anxious(she says she is scared about getting adenosine), c/o mild SOB and heart palpitations. Pt denies chest pain. T-98.4, HR-150, RR-23, SpO2-96% on [place] 2L. Lungs diminished t/o L > R.  +murmur. Pt SVT started around 2200 last night. Pt has been given 50mg  metoprolol PO @ 2129, 0.125mg  digoxin IV @ 0114, 250cc NS bolus @ 0016, 5 mg metoprolol IV @ 0018, and 0.125mg  digoxin IV @ 0256. MD has now ordered adenosine.  Interventions: Zoll pads placed on pt and attached to zoll. Baltazar Najjar, NP to bedside 6mg  adenosine given at 0428 with HR decreasing to 90(SR with 1st degree HB), BP-153/74 Plan of Care (if not transferred): Continue to monitor pt. Call RRT if further assistance needed. Event Summary: Name of Physician Notified: Baltazar Najjar, NP at (PTA RRT)    at    Outcome: Stayed in room and stabalized  Event End Time: 0445  Dillard [place]

## 2018-11-21 NOTE — Progress Notes (Signed)
Dressing change wet to dry with ns to rt breast done. Oozing small amount blood

## 2018-11-21 NOTE — Progress Notes (Signed)
PHARMACY - PHYSICIAN COMMUNICATION CRITICAL VALUE ALERT - BLOOD CULTURE IDENTIFICATION (BCID)  Results for orders placed or performed during the hospital encounter of 10/14/18  Blood Culture ID Panel (Reflexed) (Collected: 10/14/2018  2:41 PM)  Result Value Ref Range   Enterococcus species NOT DETECTED NOT DETECTED   Listeria monocytogenes NOT DETECTED NOT DETECTED   Staphylococcus species NOT DETECTED NOT DETECTED   Staphylococcus aureus (BCID) NOT DETECTED NOT DETECTED   Streptococcus species NOT DETECTED NOT DETECTED   Streptococcus agalactiae NOT DETECTED NOT DETECTED   Streptococcus pneumoniae NOT DETECTED NOT DETECTED   Streptococcus pyogenes NOT DETECTED NOT DETECTED   Acinetobacter baumannii NOT DETECTED NOT DETECTED   Enterobacteriaceae species NOT DETECTED NOT DETECTED   Enterobacter cloacae complex NOT DETECTED NOT DETECTED   Escherichia coli NOT DETECTED NOT DETECTED   Klebsiella oxytoca NOT DETECTED NOT DETECTED   Klebsiella pneumoniae NOT DETECTED NOT DETECTED   Proteus species NOT DETECTED NOT DETECTED   Serratia marcescens NOT DETECTED NOT DETECTED   Haemophilus influenzae NOT DETECTED NOT DETECTED   Neisseria meningitidis NOT DETECTED NOT DETECTED   Pseudomonas aeruginosa NOT DETECTED NOT DETECTED   Candida albicans NOT DETECTED NOT DETECTED   Candida glabrata NOT DETECTED NOT DETECTED   Candida krusei NOT DETECTED NOT DETECTED   Candida parapsilosis NOT DETECTED NOT DETECTED   Candida tropicalis NOT DETECTED NOT DETECTED    Name of physician (or Provider) Contacted: Choi  Changes to prescribed antibiotics required: no changes at this time  Erin Hearing PharmD., BCPS Clinical Pharmacist 11/21/2018 5:08 PM

## 2018-11-21 NOTE — Progress Notes (Addendum)
Progress Note  Patient Name: Beverly Tucker Date of Encounter: 11/21/2018  Primary Cardiologist: No primary care provider on file.   Subjective  O/N event: Rapid response called for worsening tachycardia with HR >140s. Given adenosine per cards on call. Current HR in 4s  Beverly Tucker was examined and evaluated at bedside this am. She mentions she had poor sleep last night. Mentions feeling palpitations but no chest pain. She mentions feeling frustrated with apparent lack of progress and also with all the additional wires. Discussed her history of recurrent SVTs need to follow up with cardiology routinely after discharge. Beverly Tucker expressed understanding.  Inpatient Medications    Scheduled Meds: . abemaciclib  50 mg Oral BID  . apixaban  5 mg Oral BID  . cefdinir  300 mg Oral Q12H  . dextromethorphan-guaiFENesin  1 tablet Oral BID  . doxycycline  100 mg Oral Q12H  . feeding supplement (ENSURE ENLIVE)  237 mL Oral BID BM  . furosemide  40 mg Oral Daily  . letrozole  2.5 mg Oral Daily  . metoprolol tartrate  5 mg Intravenous Once  . metoprolol tartrate  50 mg Oral BID  . sodium chloride flush  3 mL Intravenous Q12H  . Vitamin D (Ergocalciferol)  50,000 Units Oral Q Mon   Continuous Infusions: . sodium chloride     PRN Meds: sodium chloride, acetaminophen, ALPRAZolam, hydrALAZINE, loperamide, nitroGLYCERIN, ondansetron (ZOFRAN) IV, oxyCODONE-acetaminophen, sodium chloride flush   Vital Signs    Vitals:   11/21/18 0500 11/21/18 0600 11/21/18 0700 11/21/18 0831  BP: 126/68 124/62 (!) 148/68 137/68  Pulse: 83 80 71 63  Resp: (!) 22 (!) 28 (!) 22 (!) 21  Temp:    98.9 F (37.2 C)  TempSrc:    Axillary  SpO2: 94% 95% 97% 98%  Weight:      Height:        Intake/Output Summary (Last 24 hours) at 11/21/2018 0858 Last data filed at 11/20/2018 1800 Gross per 24 hour  Intake 460 ml  Output 400 ml  Net 60 ml   Filed Weights   11/20/18 0430 11/20/18 0537 11/21/18 0329   Weight: 80.9 kg 80.2 kg 79.2 kg    Telemetry    Overnight SVT w/ HR in 160s, resolution around 6am w/ consequent sinus bradycardia with slow improvement in HR to 80s currently - Personally Reviewed  ECG    No new tracing to read - Personally Reviewed  Physical Exam   Gen: Well-developed, chronically ill-appearing, NAD HEENT: NCAT head, hearing intact, EOMI Neck: supple, ROM intact, no JVD CV: RRR, S1, S2 normal, No rubs, no murmurs, no gallops, pacer pads in place Pulm: CTAB, No rales, no wheezes, chest tube I ntact Abd: Soft, BS+, NTND, No rebound, no guarding Extm: ROM intact, Peripheral pulses intact, No peripheral edema Skin: Dry, Warm, normal turgor  Labs    Chemistry Recent Labs  Lab 11/16/18 1840  11/19/18 0227 11/20/18 0251 11/21/18 0236  NA 138   < > 139 138 137  K 4.7   < > 3.4* 3.9 4.2  CL 98   < > 90* 92* 91*  CO2 27   < > 36* 36* 33*  GLUCOSE 145*   < > 120* 109* 117*  BUN 15   < > 18 22 25*  CREATININE 1.29*   < > 1.59* 1.38* 1.60*  CALCIUM 9.1   < > 8.6* 8.7* 9.0  PROT 6.9  --   --   --   --  ALBUMIN 3.0*  --   --   --   --   AST 37  --   --   --   --   ALT 25  --   --   --   --   ALKPHOS 89  --   --   --   --   BILITOT 0.7  --   --   --   --   GFRNONAA 39*   < > 31* 36* 30*  GFRAA 46*   < > 35* 42* 35*  ANIONGAP 13   < > 13 10 13    < > = values in this interval not displayed.     Hematology Recent Labs  Lab 11/17/18 0031 11/18/18 0322 11/19/18 0227  WBC 11.9* 9.7 8.5  RBC 3.41* 3.55* 3.48*  HGB 9.9* 10.2* 10.0*  HCT 32.3* 33.0* 32.2*  MCV 94.7 93.0 92.5  MCH 29.0 28.7 28.7  MCHC 30.7 30.9 31.1  RDW 14.6 14.4 14.5  PLT 409* 374 392    Cardiac EnzymesNo results for input(s): TROPONINI in the last 168 hours. No results for input(s): TROPIPOC in the last 168 hours.   BNP Recent Labs  Lab 11/16/18 1840  BNP 183.7*     DDimer No results for input(s): DDIMER in the last 168 hours.   Radiology    Dg Chest Port 1 View  Result  Date: 11/19/2018 CLINICAL DATA:  Cough EXAM: PORTABLE CHEST 1 VIEW COMPARISON:  Radiograph 11/18/2018, CTA 10/16/2018 FINDINGS: Stable position of a left PleurX catheter. No visible residual pneumothorax. Small right and moderate left pleural effusions are similar to comparison exam. Central venous congestion and cardiomegaly are unchanged from prior. Atherosclerotic calcification of the aortic arch is again noted. Some bandlike areas of opacity in the lung bases likely reflect atelectasis. Additional airspace opacities in the lung bases could reflect atelectasis or edema. No acute osseous or soft tissue abnormality. Degenerative changes are present in the imaged spine and shoulders. IMPRESSION: 1. Stable left PleurX catheter. No visible residual pneumothorax. 2. Unchanged small right and moderate left pleural effusions. 3. Unchanged cardiomegaly and central venous congestion. Bibasilar airspace opacities could reflect atelectasis or edema. Electronically Signed   By: Lovena Le M.D.   On: 11/19/2018 20:01    Cardiac Studies   TTE 11/21/2018 IMPRESSIONS  1. Left ventricular ejection fraction, by visual estimation, is 55 to 60%. The left ventricle has normal function. Normal left ventricular size. There is no left ventricular hypertrophy.  2. Left ventricular diastolic Doppler parameters are consistent with impaired relaxation pattern of LV diastolic filling.  3. Global right ventricle has normal systolic function.The right ventricular size is normal. No increase in right ventricular wall thickness.  4. Left atrial size was normal.  5. Right atrial size was normal.  6. Moderate thickening of the mitral valve leaflet(s).  7. Severe mitral annular calcification.  8. The mitral valve is normal in structure. Trace mitral valve regurgitation.  9. The tricuspid valve is grossly normal. Tricuspid valve regurgitation is mild. 10. The aortic valve is tricuspid Aortic valve regurgitation is mild by color  flow Doppler. Mild to moderate aortic valve sclerosis/calcification without any evidence of aortic stenosis. 11. The pulmonic valve was grossly normal. Pulmonic valve regurgitation is mild by color flow Doppler  Patient Profile     78 y.o. female w/ PMH of HTN, GERD, anxiety, stage 4 breast ca on chemo, recurrent malignant pleural effusion, HFpEF, A.fib on Eliquis and CKD3who is being seen today  for the evaluation of recurrent SVT  Assessment & Plan    Paroxysmal Atrial Fibrillation with RVR Recurrent SVT Hx of A.fib w/ RVR on metoprolol and eliquis. CHADSVASC score of 5 due to age, htn, chf, gender. Repeat episode of SVT overnight w/ HR >160s. Treated w/ adenosine. Currently in sinus HR ~80 - Telemetry - Monitor K, Mag, treat if hyper- replete if hypo - Can increase metoprolol to 75mg  BID - C/w Eliquis - Unclear if she meets criteria for ICD with recent diagnosis of metastatic breast ca  Acute on chronic diastolic heart failure Echocardiogram w/ EF 55-60%. Euvolemic on exam this am. Urinary output not recorded. Creatinine 1.6 thi sam - C/w furosemide 40mg  daily - Daily weight, I&Os  For questions or updates, please contact [facility] Please consult [email or web address] for contact info under Cardiology/STEMI.     Signed, Gilberto Better, MD PGY-2, [facility] Pager: [phone] 11/21/2018, 8:58 AM    Attending attestation to follow  The patient was seen, examined and discussed with the resident Beverly Anis, MD   and I agree with the above.   78 y.o. female with a hx of HTN, GERD, anxiety, stage 4 breast ca on chemo, recurrent malignant pleural effusion, HFpEF, A.fib on Eliquis and CKD3, she was recently hospitalization on 10/14/2018 where she was found to have new left pleural effusion and new fungating mass on breast with thoracentesis shown exudate effusion with adenocarcinoma confirmed to be metastatic breast cancer. She was followed up with oncology for initiation of treatment  and was discharged on metoprolol and eliquis. She was suggested to follow up with cardiology at discharge but has not yet had the opportunity.  She was admitted with SVT with heart rate 160 bpm that was terminated with adenosine.  The patient was in heart failure possibly precipitated by SVT but also contribution of pleural effusion secondary to breast cancer metastases.  She has diuresed well with IV Lasix, now on PO, appears euvolemic.   She went into SVT the last night again for about 5 hours, terminated with adenosine, now in SR, I would increase metoprolol to 75 mg PO BID.  Ena Dawley, MD 11/21/2018

## 2018-11-21 NOTE — Progress Notes (Signed)
Drained pleurX for only 25cc on serous drainage,

## 2018-11-21 NOTE — Telephone Encounter (Signed)
Scheduled appt per 10/21 sch message - pt son aware of appt date and time

## 2018-11-21 NOTE — Progress Notes (Addendum)
Pt has had persistent tachycardia since earlier in the shift which has been refractory to Metoprolol po, Metoprolol IV, a bolus, and 2 doses of Digoxin. Her BP was soft, that is why Dig was chosen. After all of the above, her HR remains in the 140s. A previous 12 lead tonight showed ST. Pt has a hx of SVT on admission which was relieved by Adenosine. She also has a hx of Afib.  Called cardiology on call to ask for advice. He checked chart and EKG. He suggested Adenosine 6mg .  Called RN to prepare for injection and NP to bedside.  KJKG, NP [facility] Addendum: Pushed 6mg  of Adenosine with team. HR down to 90. Pt tolerated procedure well.  KJKG, NP [facility]

## 2018-11-21 NOTE — TOC Progression Note (Addendum)
Transition of Care [facility]) - Progression Note    Patient Details  Name: Beverly Tucker MRN: [id number] Date of Birth: 05/29/1940  Transition of [facility]) CM/SW Contact  Zenon Mayo, RN Phone Number: 11/21/2018, 2:25 PM  Clinical Narrative:    NCM received vm from [facility] from [place] stating the request for SNF has been sent to their medical director, if MD would like to do peer to peer to please call [phone] opt 5 and the deadline is tomorrow at [place]. NCM informed MD of this information.   10/22- Patient  Has been denied by Medical Director with [facility] on peer to peer with MD per Dr. Maylene Roes.  NCM informed patient son and patient.  Patient states she would like to continue with [facility] for [facility], [place] at dc.     Expected Discharge Plan: Skilled Nursing Facility Barriers to Discharge: No Barriers Identified  Expected Discharge Plan and Services Expected Discharge Plan: [place] In-house Referral: NA Discharge Planning Services: CM Consult Post Acute Care Choice: [place] Living arrangements for the past 2 months: Single Family Home                 DME Arranged: (NA)         HH Arranged: NA           Social Determinants of Health (SDOH) Interventions    Readmission Risk Interventions Readmission Risk Prevention Plan 10/23/2018  Transportation Screening Complete  PCP or Specialist Appt within 5-7 Days Complete  Home Care Screening Complete  Medication Review (RN CM) Complete  Some recent data might be hidden

## 2018-11-21 NOTE — Progress Notes (Signed)
Physical Therapy Treatment Patient Details Name: Beverly Tucker MRN: [id number] DOB: 1940-06-03 Today's Date: 11/21/2018    History of Present Illness Is a 78 year old female with past medical history of hypertension, GERD, anxiety, metastatic right breast cancer with recurrent malignant pleural effusion with Pleurx recently placed last admission 0000000,  diastolic heart failure, atrial fibrillation on Eliquis, SVT, pneumothorax, CKD 3 admitted 10/17 with 1 week of worsening shortness of breath, leg edema and palpitations.  Treated for SVT in ED and had recurrence overnight day of admission which resolved spontaneouly. Pt with another episode of SVT on 10/22 AM.    PT Comments    Pt remains limited by fatigue during session, ambulating short household distances at this time before requiring seated rest breaks. Pt with increased lateral sway without the use of assistive device, requiring minA to maintain balance and reduce falls risk. Pt demonstrates improved quality of bed mobility and transfers during this session. Pt will continue to benefit from acute PT services to reduce falls risk and restore prior level of function.   Follow Up Recommendations  SNF     Equipment Recommendations  Rolling walker with 5" wheels    Recommendations for Other Services       Precautions / Restrictions Precautions Precautions: Fall Precaution Comments: pleurx, L side Restrictions Weight Bearing Restrictions: No    Mobility  Bed Mobility Overal bed mobility: Modified Independent       Supine to sit: Modified independent (Device/Increase time)     General bed mobility comments: pt left sitting edge of bed to eat at end of session  Transfers Overall transfer level: Needs assistance Equipment used: None Transfers: Sit to/from Stand Sit to Stand: Min guard         General transfer comment: minG for balance  Ambulation/Gait Ambulation/Gait assistance: Min assist Gait Distance (Feet): 80  Feet Assistive device: (intermittent use of railing) Gait Pattern/deviations: Step-to pattern;Staggering left;Staggering right Gait velocity: reduced Gait velocity interpretation: <1.8 ft/sec, indicate of risk for recurrent falls General Gait Details: slow step to gait with increased lateral sway, multiple minor LOB which pt is largely able to correct for with [place] of railing or stepping strategy   Stairs             Wheelchair Mobility    Modified Rankin (Stroke Patients Only)       Balance Overall balance assessment: Needs assistance Sitting-balance support: Feet supported;No upper extremity supported Sitting balance-Leahy Scale: Good Sitting balance - Comments: modI for static/synamic sitting balance with use of UE support for dynamic   Standing balance support: No upper extremity supported Standing balance-Leahy Scale: Poor Standing balance comment: minA for dynamic standing balance during gait                            Cognition Arousal/Alertness: Awake/alert Behavior During Therapy: WFL for tasks assessed/performed Overall Cognitive Status: Within Functional Limits for tasks assessed                                        Exercises      General Comments General comments (skin integrity, edema, etc.): pt on 2L [place]      Pertinent Vitals/Pain Pain Assessment: No/denies pain    Home Living  Prior Function            PT Goals (current goals can now be found in the care plan section) Acute Rehab PT Goals Patient Stated Goal: to get to rehab and get stronger Progress towards PT goals: Progressing toward goals    Frequency    Min 2X/week      PT Plan Current plan remains appropriate    Co-evaluation              AM-PAC PT "6 Clicks" Mobility   Outcome Measure  Help needed turning from your back to your side while in a flat bed without using bedrails?: None Help needed moving from  lying on your back to sitting on the side of a flat bed without using bedrails?: None Help needed moving to and from a bed to a chair (including a wheelchair)?: A Little Help needed standing up from a chair using your arms (e.g., wheelchair or bedside chair)?: A Little Help needed to walk in hospital room?: A Little Help needed climbing 3-5 steps with a railing? : A Lot 6 Click Score: 19    End of Session Equipment Utilized During Treatment: Oxygen;Gait belt Activity Tolerance: Patient limited by fatigue Patient left: in bed;with call bell/phone within reach Nurse Communication: Mobility status PT Visit Diagnosis: Unsteadiness on feet (R26.81);Muscle weakness (generalized) (M62.81)     Time: [id number] PT Time Calculation (min) (ACUTE ONLY): 10 min  Charges:  $Gait Training: 8-22 mins                     Zenaida Niece, PT, DPT Acute Rehabilitation Pager: [phone]    Zenaida Niece 11/21/2018, 3:18 PM

## 2018-11-21 NOTE — Progress Notes (Signed)
PROGRESS NOTE    Beverly Tucker  [id number] DOB: 03/09/1940 DOA: 11/16/2018 PCP: [name], MD     Brief Narrative:  Beverly Tucker is a 78 year old female with past medical history of hypertension, GERD, anxiety, metastatic right breast cancer recently started on abemaciclib on 10/12 and on letrozole (follows with Dr. Roney Marion appointment 10/5), recurrent malignant pleural effusion with Pleurx, chronic diastolic heart failure, atrial fibrillation on Eliquis, SVT, pneumothorax, CKD 3 who presented to the ED on 10/17 with 1 week of worsening shortness of breath, leg edema and palpitations.  In the ED, patient was found to have SVT with heart rate up to 180s and was given 6 mg of adenosine with conversion to NSR.  She was found to have presentation troponin 32, WBC 13.7, BNP 183, lactic acid 1.8, Covid negative.  Chest x-ray showed small bilateral pleural effusions and somewhat new hazy opacity and started on empiric azithromycin.  She was admitted for acute on chronic diastolic heart failure exacerbation received IV diuresis.  Overnight 10/17-10/18 patient had a recurrence of SVT which continuously converted to NSR without any medical intervention.   New events last 24 hours / Subjective: Overnight 10/21-10/22 went into SVT again.  It was refractory to metoprolol p.o., metoprolol IV, digoxin x2.  She required adenosine 6 mg and has converted to normal sinus rhythm.  This morning, patient states that she has not slept much due to overnight events.  No other complaints.   Assessment & Plan:   Principal Problem:   SVT (supraventricular tachycardia) (HCC) Active Problems:   CKD (chronic kidney disease) stage 3, GFR 30-59 ml/min   HTN (hypertension)   Metastatic breast cancer (HCC)   Pleural effusion   Acute on chronic diastolic CHF (congestive heart failure) (HCC)   Elevated troponin   Leukocytosis   Atrial fibrillation, chronic (HCC)   Malnutrition of moderate degree   Acute  hypoxic respiratory failure secondary to decompensated heart failure  -Continue to wean nasal cannula O2 to room air as able -Continue lasix   SVT  -Status post adenosine 6 mg x 1 in ED with conversion to NSR and again with brief recurrence of SVT which spontaneously converted to NSR 10/19. Again with recurrence of hemodynamically stable SVT 10/20 during BM prior to receiving her Lopressor. Recurrent SVT episode 10/21-10/22 and required adenosine again. -Continue metoprolol  -Re-consulted cardiology today   Atrial fibrillation with RVR  -CHA2DS2-VASc: 5. On Eliquis.  -Continue metoprolol   Acute on chronic diastolic heart failure in setting of Afib with RVR  -Last echo (10/15/18) EF 65 -70%, EF 55 -60% this admission -Continue Lasix, beta-blocker -Appreciate cardiology  Elevated troponin  -Troponin 32 which peaked at 49.  Suspect demand ischemia in setting of heart failure and SVT.    AKI on CKD 3a  -Cr 1.19->1.39->1.59->1.38->1.60, suspect cardio renal   Hypertension  -Stable  Metastatic breast cancer -Follows with Dr. [name], Oncology. On letrozole and abemaciclib (recently started abemaciclib 10/12). Has outpatient follow-up 10/22. Will be started on Zometa outpatient for bone mets  Recurrent malignant pleural effusion  -Pleurx drain placed 10/9. Drain every other day  Leukocytosis  -Resolved.  Afebrile. Azythromycin added on admission for empiric coverage which was switched to doxycycline 10/19.  Shortness of breath and cough is likely due to acute on chronic heart failure, although she does have a persistent infiltrate on xray and not feeling well.  -Currently on omnicef and doxycycline    DVT prophylaxis: Eliquis Code Status: Full code Family  Communication: None at bedside, discussed with son over the phone  Disposition Plan: SNF placement pending   Consultants:   Cardiology   Oncology   Procedures:   None   Antimicrobials:  Anti-infectives (From  admission, onward)   Start     Dose/Rate Route Frequency Ordered Stop   11/20/18 1500  cefdinir (OMNICEF) capsule 300 mg     300 mg Oral Every 12 hours 11/20/18 1445     11/19/18 1930  cefTRIAXone (ROCEPHIN) 1 g in sodium chloride 0.9 % 100 mL IVPB  Status:  Discontinued     1 g 200 mL/hr over 30 Minutes Intravenous Every 24 hours 11/19/18 1840 11/20/18 1445   11/18/18 1000  azithromycin (ZITHROMAX) tablet 250 mg  Status:  Discontinued     250 mg Oral Daily 11/17/18 0007 11/18/18 0900   11/18/18 1000  doxycycline (VIBRA-TABS) tablet 100 mg     100 mg Oral Every 12 hours 11/18/18 0900 11/22/18 0959   11/17/18 1000  azithromycin (ZITHROMAX) tablet 500 mg     500 mg Oral Daily 11/17/18 0007 11/17/18 0939       Objective: Vitals:   11/21/18 0500 11/21/18 0600 11/21/18 0700 11/21/18 0831  BP: 126/68 124/62 (!) 148/68 137/68  Pulse: 83 80 71 63  Resp: (!) 22 (!) 28 (!) 22 (!) 21  Temp:    98.9 F (37.2 C)  TempSrc:    Axillary  SpO2: 94% 95% 97% 98%  Weight:      Height:        Intake/Output Summary (Last 24 hours) at 11/21/2018 1157 Last data filed at 11/20/2018 1800 Gross per 24 hour  Intake 360 ml  Output 400 ml  Net -40 ml   Filed Weights   11/20/18 0430 11/20/18 0537 11/21/18 0329  Weight: 80.9 kg 80.2 kg 79.2 kg    Examination: General exam: Appears calm and comfortable  Respiratory system: Diminished breath sounds, respiratory effort normal Cardiovascular system: S1 & S2 heard, RRR. No pedal edema. Gastrointestinal system: Abdomen is nondistended, soft and nontender. Normal bowel sounds heard. Central nervous system: Alert and oriented. Non focal exam. Speech clear  Extremities: Symmetric in appearance bilaterally  Skin: No rashes, lesions or ulcers on exposed skin  Psychiatry: Judgement and insight appear stable. Mood & affect appropriate.    Data Reviewed: I have personally reviewed following labs and imaging studies  CBC: Recent Labs  Lab 11/16/18 1840  11/17/18 0031 11/18/18 0322 11/19/18 0227  WBC 13.7* 11.9* 9.7 8.5  HGB 11.3* 9.9* 10.2* 10.0*  HCT 37.5 32.3* 33.0* 32.2*  MCV 95.7 94.7 93.0 92.5  PLT 462* 409* 374 0000000   Basic Metabolic Panel: Recent Labs  Lab 11/16/18 1840 11/17/18 0031 11/18/18 0322 11/18/18 1426 11/19/18 0227 11/20/18 0251 11/21/18 0236  NA 138 141 141 135 139 138 137  K 4.7 3.9 4.1 4.0 3.4* 3.9 4.2  CL 98 99 95* 89* 90* 92* 91*  CO2 27 31 36* 32 36* 36* 33*  GLUCOSE 145* 114* 121* 162* 120* 109* 117*  BUN 15 12 15 15 18 22  25*  CREATININE 1.29* 1.19* 1.39* 1.48* 1.59* 1.38* 1.60*  CALCIUM 9.1 8.8* 8.5* 9.0 8.6* 8.7* 9.0  MG 2.3 2.0 1.9 1.8 1.8  --   --    GFR: Estimated Creatinine Clearance: 30.3 mL/min (A) (by C-G formula based on SCr of 1.6 mg/dL (H)). Liver Function Tests: Recent Labs  Lab 11/16/18 1840  AST 37  ALT 25  ALKPHOS 89  BILITOT  0.7  PROT 6.9  ALBUMIN 3.0*   No results for input(s): LIPASE, AMYLASE in the last 168 hours. No results for input(s): AMMONIA in the last 168 hours. Coagulation Profile: No results for input(s): INR, PROTIME in the last 168 hours. Cardiac Enzymes: No results for input(s): CKTOTAL, CKMB, CKMBINDEX, TROPONINI in the last 168 hours. BNP (last 3 results) No results for input(s): PROBNP in the last 8760 hours. HbA1C: No results for input(s): HGBA1C in the last 72 hours. CBG: No results for input(s): GLUCAP in the last 168 hours. Lipid Profile: No results for input(s): CHOL, HDL, LDLCALC, TRIG, CHOLHDL, LDLDIRECT in the last 72 hours. Thyroid Function Tests: No results for input(s): TSH, T4TOTAL, FREET4, T3FREE, THYROIDAB in the last 72 hours. Anemia Panel: No results for input(s): VITAMINB12, FOLATE, FERRITIN, TIBC, IRON, RETICCTPCT in the last 72 hours. Sepsis Labs: Recent Labs  Lab 11/16/18 1840 11/17/18 0030  LATICACIDVEN 1.8 1.6    Recent Results (from the past 240 hour(s))  SARS Coronavirus 2 by RT PCR (hospital order, performed in  [facility] hospital lab) Nasopharyngeal Nasopharyngeal Swab     Status: None   Collection Time: 11/16/18  7:37 PM   Specimen: Nasopharyngeal Swab  Result Value Ref Range Status   SARS Coronavirus 2 NEGATIVE NEGATIVE Final    Comment: (NOTE) If result is NEGATIVE SARS-CoV-2 target nucleic acids are NOT DETECTED. The SARS-CoV-2 RNA is generally detectable in upper and lower  respiratory specimens during the acute phase of infection. The lowest  concentration of SARS-CoV-2 viral copies this assay can detect is 250  copies / mL. A negative result does not preclude SARS-CoV-2 infection  and should not be used as the sole basis for treatment or other  patient management decisions.  A negative result may occur with  improper specimen collection / handling, submission of specimen other  than nasopharyngeal swab, presence of viral mutation(s) within the  areas targeted by this assay, and inadequate number of viral copies  (<250 copies / mL). A negative result must be combined with clinical  observations, patient history, and epidemiological information. If result is POSITIVE SARS-CoV-2 target nucleic acids are DETECTED. The SARS-CoV-2 RNA is generally detectable in upper and lower  respiratory specimens dur ing the acute phase of infection.  Positive  results are indicative of active infection with SARS-CoV-2.  Clinical  correlation with patient history and other diagnostic information is  necessary to determine patient infection status.  Positive results do  not rule out bacterial infection or co-infection with other viruses. If result is PRESUMPTIVE POSTIVE SARS-CoV-2 nucleic acids MAY BE PRESENT.   A presumptive positive result was obtained on the submitted specimen  and confirmed on repeat testing.  While 2019 novel coronavirus  (SARS-CoV-2) nucleic acids may be present in the submitted sample  additional confirmatory testing may be necessary for epidemiological  and / or clinical  management purposes  to differentiate between  SARS-CoV-2 and other Sarbecovirus currently known to infect humans.  If clinically indicated additional testing with an alternate test  methodology [phone]) is advised. The SARS-CoV-2 RNA is generally  detectable in upper and lower respiratory sp ecimens during the acute  phase of infection. The expected result is Negative. Fact Sheet for Patients:  StrictlyIdeas.no Fact Sheet for Healthcare Providers: BankingDealers.co.za This test is not yet approved or cleared by the [place] FDA and has been authorized for detection and/or diagnosis of SARS-CoV-2 by FDA under an Emergency Use Authorization (EUA).  This EUA will remain in  effect (meaning this test can be used) for the duration of the COVID-19 declaration under Section 564(b)(1) of the Act, 21 U.S.C. section 360bbb-3(b)(1), unless the authorization is terminated or revoked sooner. Performed at [facility], [place] [address] [place], [place] [address]   MRSA PCR Screening     Status: None   Collection Time: 11/17/18 12:15 AM   Specimen: Nasal Mucosa; Nasopharyngeal  Result Value Ref Range Status   MRSA by PCR NEGATIVE NEGATIVE Final    Comment:        The GeneXpert MRSA Assay (FDA approved for NASAL specimens only), is one component of a comprehensive MRSA colonization surveillance program. It is not intended to diagnose MRSA infection nor to guide or monitor treatment for MRSA infections. Performed at [facility], [place] [address], [place], [place] [address]   Culture, blood (Routine X 2) w Reflex to ID Panel     Status: None (Preliminary result)   Collection Time: 11/17/18 12:31 AM   Specimen: BLOOD  Result Value Ref Range Status   Specimen Description BLOOD LEFT ANTECUBITAL  Final   Special Requests   Final    BOTTLES DRAWN AEROBIC AND ANAEROBIC Blood Culture adequate volume   Culture   Final    NO GROWTH 4  DAYS Performed at [facility], [place] [address] [place], [place] [address]    Report Status PENDING  Incomplete  Culture, blood (Routine X 2) w Reflex to ID Panel     Status: None (Preliminary result)   Collection Time: 11/17/18 12:31 AM   Specimen: BLOOD  Result Value Ref Range Status   Specimen Description BLOOD LEFT ARM  Final   Special Requests   Final    BOTTLES DRAWN AEROBIC AND ANAEROBIC Blood Culture results may not be optimal due to an inadequate volume of blood received in culture bottles   Culture   Final    NO GROWTH 4 DAYS Performed at [facility], [place] [address] [place], [place] [address]    Report Status PENDING  Incomplete      Radiology Studies: Dg Chest Port 1 View  Result Date: 11/19/2018 CLINICAL DATA:  Cough EXAM: PORTABLE CHEST 1 VIEW COMPARISON:  Radiograph 11/18/2018, CTA 10/16/2018 FINDINGS: Stable position of a left PleurX catheter. No visible residual pneumothorax. Small right and moderate left pleural effusions are similar to comparison exam. Central venous congestion and cardiomegaly are unchanged from prior. Atherosclerotic calcification of the aortic arch is again noted. Some bandlike areas of opacity in the lung bases likely reflect atelectasis. Additional airspace opacities in the lung bases could reflect atelectasis or edema. No acute osseous or soft tissue abnormality. Degenerative changes are present in the imaged spine and shoulders. IMPRESSION: 1. Stable left PleurX catheter. No visible residual pneumothorax. 2. Unchanged small right and moderate left pleural effusions. 3. Unchanged cardiomegaly and central venous congestion. Bibasilar airspace opacities could reflect atelectasis or edema. Electronically Signed   By: Lovena Le M.D.   On: 11/19/2018 20:01      Scheduled Meds: . abemaciclib  50 mg Oral BID  . apixaban  5 mg Oral BID  . cefdinir  300 mg Oral Q12H  . dextromethorphan-guaiFENesin  1 tablet Oral BID  . doxycycline  100  mg Oral Q12H  . feeding supplement (ENSURE ENLIVE)  237 mL Oral BID BM  . furosemide  40 mg Oral Daily  . letrozole  2.5 mg Oral Daily  . metoprolol tartrate  5 mg  Intravenous Once  . metoprolol tartrate  75 mg Oral BID  . sodium chloride flush  3 mL Intravenous Q12H  . Vitamin D (Ergocalciferol)  50,000 Units Oral Q Mon   Continuous Infusions: . sodium chloride       LOS: 5 days      Time spent: 35 minutes   Dessa Phi, DO [facility] 11/21/2018, 11:57 AM   Available via Epic secure chat 7am-7pm After these hours, please refer to coverage provider listed on [email or web address]

## 2018-11-21 NOTE — Plan of Care (Signed)
  Problem: Cardiac: Goal: Ability to achieve and maintain adequate cardiopulmonary perfusion will improve Outcome: Progressing   Problem: Education: Goal: Knowledge of General Education information will improve Description: Including pain rating scale, medication(s)/side effects and non-pharmacologic comfort measures Outcome: Progressing   

## 2018-11-22 LAB — BASIC METABOLIC PANEL
Anion gap: 13 (ref 5–15)
BUN: 27 mg/dL — ABNORMAL HIGH (ref 8–23)
CO2: 34 mmol/L — ABNORMAL HIGH (ref 22–32)
Calcium: 8.8 mg/dL — ABNORMAL LOW (ref 8.9–10.3)
Chloride: 90 mmol/L — ABNORMAL LOW (ref 98–111)
Creatinine, Ser: 1.56 mg/dL — ABNORMAL HIGH (ref 0.44–1.00)
GFR calc Af Amer: 36 mL/min — ABNORMAL LOW (ref >60)
GFR calc non Af Amer: 31 mL/min — ABNORMAL LOW (ref >60)
Glucose, Bld: 103 mg/dL — ABNORMAL HIGH (ref 70–99)
Potassium: 3.9 mmol/L (ref 3.5–5.1)
Sodium: 137 mmol/L (ref 135–145)

## 2018-11-22 LAB — CULTURE, BLOOD (ROUTINE X 2)
Culture: NO GROWTH
Special Requests: ADEQUATE

## 2018-11-22 LAB — CBC
HCT: 32.6 % — ABNORMAL LOW (ref 36.0–46.0)
Hemoglobin: 10 g/dL — ABNORMAL LOW (ref 12.0–15.0)
MCH: 28.4 pg (ref 26.0–34.0)
MCHC: 30.7 g/dL (ref 30.0–36.0)
MCV: 92.6 fL (ref 80.0–100.0)
Platelets: 406 10*3/uL — ABNORMAL HIGH (ref 150–400)
RBC: 3.52 MIL/uL — ABNORMAL LOW (ref 3.87–5.11)
RDW: 14.6 % (ref 11.5–15.5)
WBC: 9.9 10*3/uL (ref 4.0–10.5)
nRBC: 0 % (ref 0.0–0.2)

## 2018-11-22 MED ORDER — FUROSEMIDE 40 MG PO TABS: 40.0000 mg | ORAL_TABLET | Freq: Every day | ORAL | 0 refills | Status: DC

## 2018-11-22 MED ORDER — PROMETHAZINE HCL 25 MG/ML IJ SOLN
12.5000 mg | Freq: Four times a day (QID) | INTRAMUSCULAR | Status: DC | PRN
  Administered 2018-11-22 – 2018-11-23 (×2): 12.5 mg via INTRAVENOUS
  Filled 2018-11-22 (×2): qty 1

## 2018-11-22 MED ORDER — DOXYCYCLINE HYCLATE 50 MG PO CAPS: 100.0000 mg | ORAL_CAPSULE | Freq: Two times a day (BID) | ORAL | 0 refills | Status: DC

## 2018-11-22 MED ORDER — METOPROLOL TARTRATE 75 MG PO TABS: 75.0000 mg | ORAL_TABLET | Freq: Two times a day (BID) | ORAL | 0 refills | Status: DC

## 2018-11-22 MED ORDER — CEFDINIR 300 MG PO CAPS: 300.0000 mg | ORAL_CAPSULE | Freq: Two times a day (BID) | ORAL | 0 refills | Status: DC

## 2018-11-22 NOTE — Plan of Care (Signed)
  Problem: Education: Goal: Ability to verbalize understanding of medication therapies will improve Outcome: Progressing   Problem: Cardiac: Goal: Ability to achieve and maintain adequate cardiopulmonary perfusion will improve Outcome: Progressing   Problem: Clinical Measurements: Goal: Ability to maintain clinical measurements within normal limits will improve Outcome: Progressing Goal: Will remain free from infection Outcome: Progressing Goal: Diagnostic test results will improve Outcome: Progressing

## 2018-11-22 NOTE — Progress Notes (Addendum)
Progress Note  Patient Name: Beverly Tucker Date of Encounter: 11/22/2018  Primary Cardiologist: No primary care provider on file.   Subjective  O/N event: None  Beverly Tucker was examined and evaluated at bedside this am. She was just woken up and states she feels groggy. Denies any chest pain, palpitations, dyspnea. She mentions having much better night than the night before and states she has no complaints at this time.  Inpatient Medications    Scheduled Meds: . abemaciclib  50 mg Oral BID  . apixaban  5 mg Oral BID  . cefdinir  300 mg Oral Q12H  . dextromethorphan-guaiFENesin  1 tablet Oral BID  . feeding supplement (ENSURE ENLIVE)  237 mL Oral BID BM  . furosemide  40 mg Oral Daily  . letrozole  2.5 mg Oral Daily  . metoprolol tartrate  5 mg Intravenous Once  . metoprolol tartrate  75 mg Oral BID  . sodium chloride flush  3 mL Intravenous Q12H  . Vitamin D (Ergocalciferol)  50,000 Units Oral Q Mon   Continuous Infusions: . sodium chloride     PRN Meds: sodium chloride, acetaminophen, ALPRAZolam, Gerhardt's butt cream, hydrALAZINE, loperamide, nitroGLYCERIN, ondansetron (ZOFRAN) IV, oxyCODONE-acetaminophen, sodium chloride flush   Vital Signs    Vitals:   11/21/18 1753 11/21/18 1931 11/21/18 2327 11/22/18 0345  BP:  123/75 (!) 113/55 (!) 119/54  Pulse: 79 82 (!) 57 (!) 58  Resp: 15 (!) 22 (!) 27 11  Temp: 98.8 F (37.1 C) 98.4 F (36.9 C) 98.4 F (36.9 C) 98.2 F (36.8 C)  TempSrc: Oral Oral Oral Oral  SpO2: 91% 95% 98% 98%  Weight:      Height:        Intake/Output Summary (Last 24 hours) at 11/22/2018 0706 Last data filed at 11/22/2018 0346 Gross per 24 hour  Intake 680 ml  Output 550 ml  Net 130 ml   Filed Weights   11/20/18 0430 11/20/18 0537 11/21/18 0329  Weight: 80.9 kg 80.2 kg 79.2 kg    Telemetry    No further SVT overnight, Sinus stable at 80s, frequent PVCs - Personally Reviewed  ECG    No new tracing to read - Personally Reviewed   Physical Exam   Gen: Well-developed, chronically ill-appearing, NAD HEENT: NCAT head, hearing intact, EOMI Neck: supple, ROM intact, no JVD CV: RRR, S1, S2 normal, No rubs, no murmurs, no gallops, pacer pads in place Pulm: CTAB, No rales, no wheezes, chest tube w/ continued drainage Abd: Soft, BS+, NTND, No rebound, no guarding Extm: ROM intact, Peripheral pulses intact, No peripheral edema Skin: Dry, Warm, normal turgor  Labs    Chemistry Recent Labs  Lab 11/16/18 1840  11/20/18 0251 11/21/18 0236 11/22/18 0240  NA 138   < > 138 137 137  K 4.7   < > 3.9 4.2 3.9  CL 98   < > 92* 91* 90*  CO2 27   < > 36* 33* 34*  GLUCOSE 145*   < > 109* 117* 103*  BUN 15   < > 22 25* 27*  CREATININE 1.29*   < > 1.38* 1.60* 1.56*  CALCIUM 9.1   < > 8.7* 9.0 8.8*  PROT 6.9  --   --   --   --   ALBUMIN 3.0*  --   --   --   --   AST 37  --   --   --   --   ALT 25  --   --   --   --  ALKPHOS 89  --   --   --   --   BILITOT 0.7  --   --   --   --   GFRNONAA 39*   < > 36* 30* 31*  GFRAA 46*   < > 42* 35* 36*  ANIONGAP 13   < > 10 13 13    < > = values in this interval not displayed.     Hematology Recent Labs  Lab 11/18/18 0322 11/19/18 0227 11/22/18 0240  WBC 9.7 8.5 9.9  RBC 3.55* 3.48* 3.52*  HGB 10.2* 10.0* 10.0*  HCT 33.0* 32.2* 32.6*  MCV 93.0 92.5 92.6  MCH 28.7 28.7 28.4  MCHC 30.9 31.1 30.7  RDW 14.4 14.5 14.6  PLT 374 392 406*    Cardiac EnzymesNo results for input(s): TROPONINI in the last 168 hours. No results for input(s): TROPIPOC in the last 168 hours.   BNP Recent Labs  Lab 11/16/18 1840  BNP 183.7*     DDimer No results for input(s): DDIMER in the last 168 hours.   Radiology    No results found.  Cardiac Studies   TTE 11/21/2018 IMPRESSIONS  1. Left ventricular ejection fraction, by visual estimation, is 55 to 60%. The left ventricle has normal function. Normal left ventricular size. There is no left ventricular hypertrophy.  2. Left  ventricular diastolic Doppler parameters are consistent with impaired relaxation pattern of LV diastolic filling.  3. Global right ventricle has normal systolic function.The right ventricular size is normal. No increase in right ventricular wall thickness.  4. Left atrial size was normal.  5. Right atrial size was normal.  6. Moderate thickening of the mitral valve leaflet(s).  7. Severe mitral annular calcification.  8. The mitral valve is normal in structure. Trace mitral valve regurgitation.  9. The tricuspid valve is grossly normal. Tricuspid valve regurgitation is mild. 10. The aortic valve is tricuspid Aortic valve regurgitation is mild by color flow Doppler. Mild to moderate aortic valve sclerosis/calcification without any evidence of aortic stenosis. 11. The pulmonic valve was grossly normal. Pulmonic valve regurgitation is mild by color flow Doppler  Patient Profile     78 y.o. female w/ PMH of HTN, GERD, anxiety, stage 4 breast ca on chemo, recurrent malignant pleural effusion, HFpEF, A.fib on Eliquis and CKD3who is being seen today for the evaluation of recurrent SVT  Assessment & Plan    Paroxysmal Atrial Fibrillation with RVR Recurrent SVT Hx of A.fib w/ RVR on metoprolol and eliquis. CHADSVASC score of 5 due to age, htn, chf, gender. Repeat episode of SVT on 11/20/18 w/ HR >160s. Treated w/ adenosine. Currently in sinus HR ~80 - Can remove pacer pads - Telemetry - Monitor K, Mag, treat if hyper- replete if hypo - C/w metoprolol to 75mg  BID - C/w Eliquis  Acute on chronic diastolic heart failure Echocardiogram w/ EF 55-60%. Euvolemic on exam this am. Stable weight. Inaccurate urinary output. Renal fx stable (creatinine 1.56) - C/w furosemide 40mg  daily - Daily weight, I&Os  [facility] will sign off.   Medication Recommendations:  Metoprolol 75mg  BID, Furosemide 40mg  daily  Other recommendations (labs, testing, etc):  N/A Follow up as an outpatient:  Y  For  questions or updates, please contact [place] Please consult [email or web address] for contact info under Cardiology/STEMI.     Signed, Gilberto Better, MD PGY-2, [place] [facility] Pager: [phone] 11/22/2018, 7:06 AM    Attending attestation to follow  The patient was seen, examined and discussed with  the resident Mosetta Anis, MD   and I agree with the above.   The patient remains in SR, continue current medication regimen with metoprolol 75 mg PO BID, we will sign off, call us with any questions.   Ena Dawley, MD 11/22/2018

## 2018-11-22 NOTE — Plan of Care (Signed)
  Problem: Activity: Goal: Capacity to carry out activities will improve Outcome: Progressing   Problem: Education: Goal: Knowledge of General Education information will improve Description: Including pain rating scale, medication(s)/side effects and non-pharmacologic comfort measures Outcome: Progressing   Problem: Clinical Measurements: Goal: Cardiovascular complication will be avoided Outcome: Progressing   Problem: Nutrition: Goal: Adequate nutrition will be maintained Outcome: Progressing   Problem: Coping: Goal: Level of anxiety will decrease Outcome: Progressing   Problem: Pain Managment: Goal: General experience of comfort will improve Outcome: Progressing   Problem: Safety: Goal: Ability to remain free from injury will improve Outcome: Progressing

## 2018-11-22 NOTE — TOC Transition Note (Signed)
Transition of Care [facility]) - CM/SW Discharge Note   Patient Details  Name: Beverly Tucker MRN: [id number] Date of Birth: 1940/09/07  [facility] CM/SW Contact:  Zenon Mayo, RN Phone Number: 11/22/2018, 1:47 PM   Clinical Narrative:    Patient is active with [facility],  She would like to resume HHRN, HHPT, HHOT, HHAIDE with them.  NCM contacted Georgina Snell to notify of patient 's dc today.  Also patient will need a rolling walker, NCM made referral to [facility] ,the walker will be brought to patients room.     Final next level of care: [place] Barriers to Discharge: No Barriers Identified   Patient Goals and CMS Choice Patient states their goals for this hospitalization and ongoing recovery are:: get better CMS Medicare.gov Compare Post Acute Care list provided to:: Patient Choice offered to / list presented to : Patient  Discharge Placement                       Discharge Plan and Services In-house Referral: NA Discharge Planning Services: CM Consult Post Acute Care Choice: [place]          DME Arranged: Gilford Rile rolling DME Agency: [employer] Date DME Agency Contacted: 11/22/18 Time DME Agency Contacted: [phone] Representative spoke with at DME Agency: [facility] HH Arranged: RN, Disease Management, PT, OT, Nurse's Aide [place] Agency: [place] Date [place]: 11/22/18 Time [place]: 1347 Representative spoke with at [place]: [place] ([place]) Interventions     Readmission Risk Interventions Readmission Risk Prevention Plan 10/23/2018  Transportation Screening Complete  PCP or Specialist Appt within 5-7 Days Complete  Home Care Screening Complete  Medication Review (RN CM) Complete  Some recent data might be hidden

## 2018-11-22 NOTE — Discharge Summary (Signed)
Physician Discharge Summary  Beverly Tucker [id number] DOB: August 26, 1940 DOA: 11/16/2018  PCP: [name], MD  Admit date: 11/16/2018 Discharge date: 11/22/2018  Admitted From: Home Disposition:  Home with home health. Insurance denied SNF claim  Recommendations for Outpatient Follow-up:  1. Follow up with PCP in 1 week 2. Follow up with cardiology Dr. Meda Coffee in 1-2 weeks  3. Follow-up with oncology Dr. [name] as scheduled 11/3 4. Follow-up with outpatient BMP in 1 week to check kidney function 5. Continue to drain from Pleurx every other day.  Next due 10/24.  Discharge Condition: Stable CODE STATUS: Full code Diet recommendation: Heart healthy  Brief/Interim Summary: Beverly Tucker is a 78 year old female with past medical history of hypertension, GERD, anxiety, metastatic right breast cancer recently started on abemaciclib on 10/12 and on letrozole (follows with Dr. Roney Marion appointment 10/5), recurrent malignant pleural effusion with Pleurx, chronic diastolic heart failure, atrial fibrillation on Eliquis, SVT, pneumothorax, CKD 3 who presented to the ED on 10/17 with 1 week of worsening shortness of breath, leg edema and palpitations. In the ED, patient was found to have SVT with heart rate up to 180s and was given 6 mg of adenosine with conversion to NSR. She was found to have presentation troponin 32, WBC 13.7, BNP 183, lactic acid 1.8, Covid negative. Chest x-ray showed small bilateral pleural effusions and somewhat new hazy opacity and started on empiric azithromycin. She was admitted for acute on chronic diastolic heart failure exacerbation received IV diuresis. Overnight 10/17-10/18 patient had a recurrence of SVT which continuously converted to NSR without any medical intervention. Recurrent SVT episode 10/21-10/22 and required adenosine again and metoprolol dose further adjusted. Patient has remained in NSR since then.   Discharge Diagnoses:  Principal Problem:   SVT  (supraventricular tachycardia) (HCC) Active Problems:   CKD (chronic kidney disease) stage 3, GFR 30-59 ml/min   HTN (hypertension)   Metastatic breast cancer (HCC)   Pleural effusion   Acute on chronic diastolic CHF (congestive heart failure) (HCC)   Elevated troponin   Leukocytosis   Atrial fibrillation, chronic (HCC)   Malnutrition of moderate degree    Acute hypoxic respiratory failure secondary to decompensated heart failure -Continue to wean nasal cannula O2 to room air. Home desat screen for O2 needs  -Continue lasix   SVT -Status post adenosine 6 mg x 1 in ED with conversion to NSR and again with brief recurrence of SVT which spontaneously converted to NSR 10/19. Again with recurrence of hemodynamically stable SVT 10/20 during BM prior to receiving her Lopressor. Recurrent SVT episode 10/21-10/22 and required adenosine again. -Continue metoprolol, dose increased  -Now in NSR   Paroxysmal atrial fibrillation with RVR -CHA2DS2-VASc: 5. On Eliquis.  -Continue metoprolol   Acute on chronic diastolic heart failure in setting of Afib with RVR -Last echo (10/15/18) EF 65 -70%, EF 55-60% this admission -Continue Lasix, beta-blocker -Appreciate cardiology  Elevated troponin -Troponin 32 which peaked at 49. Suspect demand ischemia in setting of heart failure and SVT.   AKI on CKD 3a -Cr 1.19->1.39->1.59->1.38->1.60->1.56, suspect cardio renal   Hypertension -Stable  Metastatic breast cancer -Follows with Dr. [name], Oncology. On letrozole and abemaciclib (recently started abemaciclib 10/12).Has outpatient follow-up 11/3. Will be started on Zometa outpatient for bone mets  Recurrent malignant pleural effusion -Pleurx drain placed 10/9. Drain every other day  Leukocytosis -Resolved.  Afebrile. Azythromycinadded onadmissionfor empiric coveragewhich was switched to doxycycline 10/19. Shortness of breath and cough is likely due to acute on  chronic  heart failure, although she does have a persistent infiltrate on xray and not feeling well.  -Currently on omnicef and doxycycline 1 more day to complete total 5 day course   Blood culture contamination -1 out of 4 bottles with micrococcus species reported.  This likely represents blood culture contamination rather than true infection   Discharge Instructions  Discharge Instructions    (HEART FAILURE PATIENTS) Call MD:  Anytime you have any of the following symptoms: 1) 3 pound weight gain in 24 hours or 5 pounds in 1 week 2) shortness of breath, with or without a dry hacking cough 3) swelling in the hands, feet or stomach 4) if you have to sleep on extra pillows at night in order to breathe.   Complete by: As directed    Call MD for:  difficulty breathing, headache or visual disturbances   Complete by: As directed    Call MD for:  extreme fatigue   Complete by: As directed    Call MD for:  persistant dizziness or light-headedness   Complete by: As directed    Call MD for:  persistant nausea and vomiting   Complete by: As directed    Call MD for:  severe uncontrolled pain   Complete by: As directed    Call MD for:  temperature >100.4   Complete by: As directed    Diet - low sodium heart healthy   Complete by: As directed    Discharge instructions   Complete by: As directed    You were cared for by a hospitalist during your hospital stay. If you have any questions about your discharge medications or the care you received while you were in the hospital after you are discharged, you can call the unit and ask to speak with the hospitalist on call if the hospitalist that took care of you is not available. Once you are discharged, your primary care physician will handle any further medical issues. Please note that NO REFILLS for any discharge medications will be authorized once you are discharged, as it is imperative that you return to your primary care physician (or establish a relationship  with a primary care physician if you do not have one) for your aftercare needs so that they can reassess your need for medications and monitor your lab values.   Increase activity slowly   Complete by: As directed      Allergies as of 11/22/2018   No Known Allergies     Medication List    STOP taking these medications   oxyCODONE-acetaminophen 7.5-325 MG tablet Commonly known as: PERCOCET     TAKE these medications   abemaciclib 50 MG tablet Commonly known as: Verzenio Take 1 tablet (50 mg total) by mouth 2 (two) times daily. Swallow tablets whole. Do not chew, crush, or split tablets before swallowing.   ALPRAZolam 0.5 MG tablet Commonly known as: XANAX Take 0.25-0.5 mg by mouth 2 (two) times daily as needed for anxiety.   apixaban 5 MG Tabs tablet Commonly known as: ELIQUIS Take 1 tablet (5 mg total) by mouth 2 (two) times daily.   cefdinir 300 MG capsule Commonly known as: OMNICEF Take 1 capsule (300 mg total) by mouth every 12 (twelve) hours for 1 day.   doxycycline 50 MG capsule Commonly known as: VIBRAMYCIN Take 2 capsules (100 mg total) by mouth 2 (two) times daily for 1 day.   ergocalciferol 1.25 MG (50000 UT) capsule Commonly known as: VITAMIN D2 Take 1 capsule (  50,000 Units total) by mouth once a week. What changed: when to take this   furosemide 40 MG tablet Commonly known as: LASIX Take 1 tablet (40 mg total) by mouth daily. Start taking on: November 23, 2018   letrozole 2.5 MG tablet Commonly known as: FEMARA Take 1 tablet (2.5 mg total) by mouth daily.   Metoprolol Tartrate 75 MG Tabs Take 75 mg by mouth 2 (two) times daily. What changed:   medication strength  how much to take            Durable Medical Equipment  (From admission, onward)         Start     Ordered   11/21/18 1649  For home use only DME Walker  Once    Question:  Patient needs a walker to treat with the following condition  Answer:  Weakness   11/21/18 1648          Follow-up Information    [name], MD. Schedule an appointment as soon as possible for a visit in 1 week(s).   Specialty: Family Medicine Contact information: [address] [place] [address]        Dorothy Spark, MD. Schedule an appointment as soon as possible for a visit in 1 week(s).   Specialty: Cardiology Contact information: [address] [place] [address] [phone]        Brunetta Genera, MD. Go on 12/03/2018.   Specialties: Hematology, Oncology Contact information: [place] [address] [phone]          No Known Allergies  Consultations:  Cardiology   Oncology    Procedures/Studies: Dg Chest 1 View  Result Date: 11/06/2018 CLINICAL DATA:  Post left-sided thoracentesis. EXAM: CHEST  1 VIEW COMPARISON:  Radiographs 11/05/2018 and 10/22/2018. FINDINGS: 1344 hours. The left pleural effusion has decreased in volume. There is improved aeration of the left lung base. There is a stable small right pleural effusion and mild right basilar atelectasis. The heart size and mediastinal contours are stable with aortic atherosclerosis. No pneumothorax. IMPRESSION: 1. Decreased left pleural effusion and improved aeration of the left lung base status post thoracentesis. No pneumothorax. 2. Stable small right pleural effusion and right basilar atelectasis. Electronically Signed   By: Richardean Sale M.D.   On: 11/06/2018 14:16   Dg Chest 2 View  Result Date: 11/08/2018 CLINICAL DATA:  Left pleural effusion. Insertion of PleurX catheter this morning. Coughing after the procedure. EXAM: CHEST - 2 VIEW COMPARISON:  Radiographs dated 11/07/2018 and 11/06/2018 FINDINGS: There is a tiny left apical pneumothorax. PleurX catheter in place. Minimal residual pleural effusion. There is a small right pleural effusion. Chronic cardiomegaly. Pulmonary vascularity is within normal limits. No acute bone abnormality.  Chronic accentuation of the thoracic kyphosis. IMPRESSION: 1. Tiny left apical pneumothorax. PleurX catheter in place. 2. Small right pleural effusion.  Minimal residual left effusion. Electronically Signed   By: Lorriane Shire M.D.   On: 11/08/2018 12:23   Dg Chest 2 View  Result Date: 11/07/2018 CLINICAL DATA:  Left thoracentesis yesterday EXAM: CHEST - 2 VIEW COMPARISON:  11/06/2018 FINDINGS: Moderate left pleural effusion appears slightly larger. No pneumothorax. Consolidation left lower lobe shows progression Small right effusion and mild right lower lobe airspace disease unchanged. Negative for heart failure or edema. IMPRESSION: Progression of left effusion and left lower lobe consolidation following left thoracentesis. No pneumothorax. Electronically Signed   By: Franchot Gallo M.D.  On: 11/07/2018 09:53   Dg Chest 2 View  Result Date: 11/05/2018 CLINICAL DATA:  Shortness of breath EXAM: CHEST - 2 VIEW COMPARISON:  October 22, 2018 FINDINGS: There is mild cardiomegaly. A moderate to large left pleural effusion is seen. There is trace right pleural effusion. Mildly increased interstitial markings seen predominantly at the right lung base, likely chronic lung changes. Air not calcifications. IMPRESSION: Moderate to large left and trace right pleural effusion. Electronically Signed   By: Prudencio Pair M.D.   On: 11/05/2018 22:09   Dg Chest Port 1 View  Result Date: 11/19/2018 CLINICAL DATA:  Cough EXAM: PORTABLE CHEST 1 VIEW COMPARISON:  Radiograph 11/18/2018, CTA 10/16/2018 FINDINGS: Stable position of a left PleurX catheter. No visible residual pneumothorax. Small right and moderate left pleural effusions are similar to comparison exam. Central venous congestion and cardiomegaly are unchanged from prior. Atherosclerotic calcification of the aortic arch is again noted. Some bandlike areas of opacity in the lung bases likely reflect atelectasis. Additional airspace opacities in the lung bases  could reflect atelectasis or edema. No acute osseous or soft tissue abnormality. Degenerative changes are present in the imaged spine and shoulders. IMPRESSION: 1. Stable left PleurX catheter. No visible residual pneumothorax. 2. Unchanged small right and moderate left pleural effusions. 3. Unchanged cardiomegaly and central venous congestion. Bibasilar airspace opacities could reflect atelectasis or edema. Electronically Signed   By: Lovena Le M.D.   On: 11/19/2018 20:01   Dg Chest Port 1 View  Result Date: 11/18/2018 CLINICAL DATA:  Shortness of breath.  Weakness.  Pleural effusion. EXAM: PORTABLE CHEST 1 VIEW COMPARISON:  11/17/2018. FINDINGS: Left PleurX catheter in stable position. Cardiomegaly with pulmonary venous congestion again noted without interim change. Low lung volumes. Bibasilar infiltrates/edema again noted. Small unchanged bilateral pleural effusions again noted. No pneumothorax. IMPRESSION: 1.  Left PleurX catheter in stable position.  No pneumothorax. 2. Cardiomegaly with pulmonary venous congestion again noted without interim change. 3. Low lung volumes. Bibasilar and infiltrates/edema again noted. Small unchanged bilateral pleural effusions again noted. Electronically Signed   By: Marcello Moores  Register   On: 11/18/2018 06:44   Dg Chest Port 1 View  Result Date: 11/17/2018 CLINICAL DATA:  Shortness of breath. EXAM: PORTABLE CHEST 1 VIEW COMPARISON:  Radiograph yesterday. CT 10/16/2018 FINDINGS: Left PleurX catheter remains in place. Low lung volumes with bibasilar volume loss. Hazy bibasilar opacities consistent with pleural effusions adjacent atelectasis/consolidation. Cardiomegaly, not well assessed due to adjacent airspace disease. Worsening vascular congestion. No visualized pneumothorax. IMPRESSION: 1. Worsening vascular congestion. 2. Bibasilar opacities consistent with pleural effusions and adjacent atelectasis/consolidation. 3. Left PleurX catheter remains in place. No visualized  pneumothorax. 4. Cardiomegaly. Electronically Signed   By: Keith Rake M.D.   On: 11/17/2018 02:19   Dg Chest Port 1 View  Result Date: 11/16/2018 CLINICAL DATA:  Shortness of breath EXAM: PORTABLE CHEST 1 VIEW COMPARISON:  November 09, 2018 FINDINGS: There is mild cardiomegaly. Again noted is a left-sided pleural catheter. Aortic knob calcifications. There is a small left pleural effusion with patchy airspace opacity at the left lung base which is new. There is a trace right pleural effusion with hazy airspace opacity at the right lung base. IMPRESSION: Small bilateral pleural effusions, left greater than right with new hazy airspace opacities at both lung bases which could be due to infectious etiology, layering effusion, and/or edema. Cardiomegaly Left-sided pleural catheter. Electronically Signed   By: Prudencio Pair M.D.   On: 11/16/2018 19:15   Dg Chest  Port 1 View  Result Date: 11/09/2018 CLINICAL DATA:  Short of breath and weakness. EXAM: PORTABLE CHEST 1 VIEW COMPARISON:  1 day prior FINDINGS: Left-sided PleurX catheter remains in place. The Chin overlies the apices. Moderate cardiomegaly. Increase in small left pleural effusion. Given limitations of chin overlying the left apex, no residual pneumothorax is identified. Similar right and worsened left base airspace disease. Suspect developing mild interstitial edema. IMPRESSION: Worsened aeration, with probable developing interstitial edema and progressive bibasilar airspace disease. Left pleural drain in place, without convincing evidence of residual pneumothorax. Electronically Signed   By: Abigail Miyamoto M.D.   On: 11/09/2018 10:53   Ir Perc Pleural Drain W/indwell Cath W/img Guide  Result Date: 11/08/2018 INDICATION: 78 year old female with stage IV breast cancer and a recurrent symptomatic left-sided malignant pleural effusion. She presents for placement of a tunneled pleural drainage catheter. EXAM: Placement of a tunneled pleural  drainage catheter MEDICATIONS: 2 g Ancef ANESTHESIA/SEDATION: Fentanyl 50 mcg IV; Versed 1.5 mg IV Moderate Sedation Time:  20 minutes The patient was continuously monitored during the procedure by the interventional radiology nurse under my direct supervision. COMPLICATIONS: None immediate. PROCEDURE: Informed written consent was obtained from the patient after a thorough discussion of the procedural risks, benefits and alternatives. All questions were addressed. Maximal Sterile Barrier Technique was utilized including caps, mask, sterile gowns, sterile gloves, sterile drape, hand hygiene and skin antiseptic. A timeout was performed prior to the initiation of the procedure. Ultrasound was used to interrogate the left chest. A large pleural effusion is present. A suitable skin entry site was selected and marked. Local anesthesia was attained by infiltration with 1% lidocaine. A small dermatotomy was made. An 18 gauge sheath needle was then advanced over a rib and into the pleural space. The sheath was left in place while the 18 gauge needle was removed. A short Amplatz wire was advanced through the sheath needle and positioned in the pleural space. A suitable skin exit site 5 cm anterior and medial to the pleural entry site was identified. Local anesthesia was again attained by infiltration with 1% lidocaine. A second small dermatotomy was made. The pleural drainage catheter was then tunneled from the exit site to the dermatotomy overlying the pleural entry site. The sheath needle was then removed over the wire. A peel-away sheath was advanced over the wire and into the pleural space. The tunneled drainage catheter was then advanced through the peel-away sheath and the peel-away sheath was discarded. The catheter was connected to a vacuum bottle and approximately 700 mL of pleural fluid was evacuated. Further aspiration was stopped due to symptomatic coughing. The catheter was secured to the skin with 0 Prolene  suture. The dermatotomy overlying the pleural entry site was closed with Dermabond. Sterile bandages were placed. IMPRESSION: Technically successful placement of a left tunneled pleural drainage catheter. Signed, Criselda Peaches, MD,  [facility] Radiology Electronically Signed   By: Jacqulynn Cadet M.D.   On: 11/08/2018 10:05   US Thoracentesis Asp Pleural Space W/img Guide  Result Date: 11/06/2018 INDICATION: Patient with history of CHF, chronic kidney disease, dyspnea, metastatic breast cancer, recurrent left pleural effusion. Request made for therapeutic left thoracentesis. EXAM: ULTRASOUND GUIDED THERAPEUTIC LEFT THORACENTESIS MEDICATIONS: None COMPLICATIONS: None immediate. PROCEDURE: An ultrasound guided thoracentesis was thoroughly discussed with the patient and questions answered. The benefits, risks, alternatives and complications were also discussed. The patient understands and wishes to proceed with the procedure. Written consent was obtained. Ultrasound  was performed to localize and mark an adequate pocket of fluid in the left chest. The area was then prepped and draped in the normal sterile fashion. 1% Lidocaine was used for local anesthesia. Under ultrasound guidance a 6 Fr Safe-T-Centesis catheter was introduced. Thoracentesis was performed. The catheter was removed and a dressing applied. FINDINGS: A total of approximately 900 cc of amber/blood-tinged fluid was removed. Due to persistent patient coughing only the above amount of fluid was removed today. IMPRESSION: Successful ultrasound guided therapeutic left thoracentesis yielding 900 cc of pleural fluid. Read by: Rowe Robert, PA-C Electronically Signed   By: Jacqulynn Cadet M.D.   On: 11/06/2018 14:11       Discharge Exam: Vitals:   11/22/18 0700 11/22/18 1029  BP: (!) 158/71 134/67  Pulse: 81 82  Resp: 19   Temp: 98.5 F (36.9 C)   SpO2: 91%     General: Pt is  alert, awake, not in acute distress Cardiovascular: RRR, S1/S2 +, no edema Respiratory: CTA bilaterally, no wheezing, no rhonchi, no respiratory distress, no conversational dyspnea  Abdominal: Soft, NT, ND, bowel sounds + Extremities: no edema, no cyanosis Psych: Normal mood and affect, stable judgement and insight     The results of significant diagnostics from this hospitalization (including imaging, microbiology, ancillary and laboratory) are listed below for reference.     Microbiology: Recent Results (from the past 240 hour(s))  SARS Coronavirus 2 by RT PCR (hospital order, performed in [facility] hospital lab) Nasopharyngeal Nasopharyngeal Swab     Status: None   Collection Time: 11/16/18  7:37 PM   Specimen: Nasopharyngeal Swab  Result Value Ref Range Status   SARS Coronavirus 2 NEGATIVE NEGATIVE Final    Comment: (NOTE) If result is NEGATIVE SARS-CoV-2 target nucleic acids are NOT DETECTED. The SARS-CoV-2 RNA is generally detectable in upper and lower  respiratory specimens during the acute phase of infection. The lowest  concentration of SARS-CoV-2 viral copies this assay can detect is 250  copies / mL. A negative result does not preclude SARS-CoV-2 infection  and should not be used as the sole basis for treatment or other  patient management decisions.  A negative result may occur with  improper specimen collection / handling, submission of specimen other  than nasopharyngeal swab, presence of viral mutation(s) within the  areas targeted by this assay, and inadequate number of viral copies  (<250 copies / mL). A negative result must be combined with clinical  observations, patient history, and epidemiological information. If result is POSITIVE SARS-CoV-2 target nucleic acids are DETECTED. The SARS-CoV-2 RNA is generally detectable in upper and lower  respiratory specimens dur ing the acute phase of infection.  Positive  results are indicative of active infection with  SARS-CoV-2.  Clinical  correlation with patient history and other diagnostic information is  necessary to determine patient infection status.  Positive results do  not rule out bacterial infection or co-infection with other viruses. If result is PRESUMPTIVE POSTIVE SARS-CoV-2 nucleic acids MAY BE PRESENT.   A presumptive positive result was obtained on the submitted specimen  and confirmed on repeat testing.  While 2019 novel coronavirus  (SARS-CoV-2) nucleic acids may be present in the submitted sample  additional confirmatory testing may be necessary for epidemiological  and / or clinical management purposes  to differentiate between  SARS-CoV-2 and other Sarbecovirus currently known to infect humans.  If clinically indicated additional testing with an alternate test  methodology [phone]) is advised. The SARS-CoV-2 RNA is  generally  detectable in upper and lower respiratory sp ecimens during the acute  phase of infection. The expected result is Negative. Fact Sheet for Patients:  StrictlyIdeas.no Fact Sheet for Healthcare Providers: BankingDealers.co.za This test is not yet approved or cleared by the [place] FDA and has been authorized for detection and/or diagnosis of SARS-CoV-2 by FDA under an Emergency Use Authorization (EUA).  This EUA will remain in effect (meaning this test can be used) for the duration of the COVID-19 declaration under Section 564(b)(1) of the Act, 21 U.S.C. section 360bbb-3(b)(1), unless the authorization is terminated or revoked sooner. Performed at [facility], [place] [address], [place], [place] [address]   MRSA PCR Screening     Status: None   Collection Time: 11/17/18 12:15 AM   Specimen: Nasal Mucosa; Nasopharyngeal  Result Value Ref Range Status   MRSA by PCR NEGATIVE NEGATIVE Final    Comment:        The GeneXpert MRSA Assay (FDA approved for NASAL specimens only), is one component of  a comprehensive MRSA colonization surveillance program. It is not intended to diagnose MRSA infection nor to guide or monitor treatment for MRSA infections. Performed at [facility], [place] [address] [place], [place] [address]   Culture, blood (Routine X 2) w Reflex to ID Panel     Status: Abnormal   Collection Time: 11/17/18 12:31 AM   Specimen: BLOOD  Result Value Ref Range Status   Specimen Description BLOOD LEFT ANTECUBITAL  Final   Special Requests   Final    BOTTLES DRAWN AEROBIC AND ANAEROBIC Blood Culture adequate volume   Culture  Setup Time   Final    GRAM POSITIVE COCCI AEROBIC BOTTLE ONLY Gram Stain Report Called to,Read Back By and Verified WithTillman Sers PHARMD [id number] 11/21/18 A BROWNING    Culture (A)  Final    MICROCOCCUS SPECIES CORRECTED ON 10/23 AT 0751: PREVIOUSLY REPORTED AS NO GROWTH 5 DAYS Standardized susceptibility testing for this organism is not available. Performed at [facility], [place] [address], [place], [place] [address]    Report Status 11/22/2018 FINAL  Final  Culture, blood (Routine X 2) w Reflex to ID Panel     Status: None   Collection Time: 11/17/18 12:31 AM   Specimen: BLOOD  Result Value Ref Range Status   Specimen Description BLOOD LEFT ARM  Final   Special Requests   Final    BOTTLES DRAWN AEROBIC AND ANAEROBIC Blood Culture results may not be optimal due to an inadequate volume of blood received in culture bottles   Culture   Final    NO GROWTH 5 DAYS Performed at [facility], [place] [address], [place], [place] [address]    Report Status 11/22/2018 FINAL  Final     Labs: BNP (last 3 results) Recent Labs    10/14/18 1440 11/05/18 2353 11/16/18 1840  BNP 458.5* 47.6 0000000*   Basic Metabolic Panel: Recent Labs  Lab 11/16/18 1840 11/17/18 0031 11/18/18 0322 11/18/18 1426 11/19/18 0227 11/20/18 0251 11/21/18 0236 11/22/18 0240  NA 138 141 141 135 139 138 137 137  K 4.7 3.9 4.1 4.0 3.4* 3.9 4.2 3.9  CL  98 99 95* 89* 90* 92* 91* 90*  CO2 27 31 36* 32 36* 36* 33* 34*  GLUCOSE 145* 114* 121* 162* 120* 109* 117* 103*  BUN 15 12 15 15 18 22  25* 27*  CREATININE 1.29* 1.19* 1.39* 1.48* 1.59* 1.38* 1.60*  1.56*  CALCIUM 9.1 8.8* 8.5* 9.0 8.6* 8.7* 9.0 8.8*  MG 2.3 2.0 1.9 1.8 1.8  --   --   --    Liver Function Tests: Recent Labs  Lab 11/16/18 1840  AST 37  ALT 25  ALKPHOS 89  BILITOT 0.7  PROT 6.9  ALBUMIN 3.0*   No results for input(s): LIPASE, AMYLASE in the last 168 hours. No results for input(s): AMMONIA in the last 168 hours. CBC: Recent Labs  Lab 11/16/18 1840 11/17/18 0031 11/18/18 0322 11/19/18 0227 11/22/18 0240  WBC 13.7* 11.9* 9.7 8.5 9.9  HGB 11.3* 9.9* 10.2* 10.0* 10.0*  HCT 37.5 32.3* 33.0* 32.2* 32.6*  MCV 95.7 94.7 93.0 92.5 92.6  PLT 462* 409* 374 392 406*   Cardiac Enzymes: No results for input(s): CKTOTAL, CKMB, CKMBINDEX, TROPONINI in the last 168 hours. BNP: Invalid input(s): POCBNP CBG: No results for input(s): GLUCAP in the last 168 hours. D-Dimer No results for input(s): DDIMER in the last 72 hours. Hgb A1c No results for input(s): HGBA1C in the last 72 hours. Lipid Profile No results for input(s): CHOL, HDL, LDLCALC, TRIG, CHOLHDL, LDLDIRECT in the last 72 hours. Thyroid function studies No results for input(s): TSH, T4TOTAL, T3FREE, THYROIDAB in the last 72 hours.  Invalid input(s): FREET3 Anemia work up No results for input(s): VITAMINB12, FOLATE, FERRITIN, TIBC, IRON, RETICCTPCT in the last 72 hours. Urinalysis    Component Value Date/Time   COLORURINE YELLOW 11/07/2018 0017   APPEARANCEUR CLOUDY (A) 11/07/2018 0017   LABSPEC 1.013 11/07/2018 0017   PHURINE 6.0 11/07/2018 0017   GLUCOSEU NEGATIVE 11/07/2018 0017   HGBUR MODERATE (A) 11/07/2018 0017   BILIRUBINUR NEGATIVE 11/07/2018 0017   KETONESUR NEGATIVE 11/07/2018 0017   PROTEINUR NEGATIVE 11/07/2018 0017   NITRITE POSITIVE (A) 11/07/2018 0017   LEUKOCYTESUR LARGE (A)  11/07/2018 0017   Sepsis Labs Invalid input(s): PROCALCITONIN,  WBC,  LACTICIDVEN Microbiology Recent Results (from the past 240 hour(s))  SARS Coronavirus 2 by RT PCR (hospital order, performed in [place] hospital lab) Nasopharyngeal Nasopharyngeal Swab     Status: None   Collection Time: 11/16/18  7:37 PM   Specimen: Nasopharyngeal Swab  Result Value Ref Range Status   SARS Coronavirus 2 NEGATIVE NEGATIVE Final    Comment: (NOTE) If result is NEGATIVE SARS-CoV-2 target nucleic acids are NOT DETECTED. The SARS-CoV-2 RNA is generally detectable in upper and lower  respiratory specimens during the acute phase of infection. The lowest  concentration of SARS-CoV-2 viral copies this assay can detect is 250  copies / mL. A negative result does not preclude SARS-CoV-2 infection  and should not be used as the sole basis for treatment or other  patient management decisions.  A negative result may occur with  improper specimen collection / handling, submission of specimen other  than nasopharyngeal swab, presence of viral mutation(s) within the  areas targeted by this assay, and inadequate number of viral copies  (<250 copies / mL). A negative result must be combined with clinical  observations, patient history, and epidemiological information. If result is POSITIVE SARS-CoV-2 target nucleic acids are DETECTED. The SARS-CoV-2 RNA is generally detectable in upper and lower  respiratory specimens dur ing the acute phase of infection.  Positive  results are indicative of active infection with SARS-CoV-2.  Clinical  correlation with patient history and other diagnostic information is  necessary to determine patient infection status.  Positive results do  not rule out bacterial infection or co-infection with other viruses.  If result is PRESUMPTIVE POSTIVE SARS-CoV-2 nucleic acids MAY BE PRESENT.   A presumptive positive result was obtained on the submitted specimen  and confirmed on repeat  testing.  While 2019 novel coronavirus  (SARS-CoV-2) nucleic acids may be present in the submitted sample  additional confirmatory testing may be necessary for epidemiological  and / or clinical management purposes  to differentiate between  SARS-CoV-2 and other Sarbecovirus currently known to infect humans.  If clinically indicated additional testing with an alternate test  methodology [phone]) is advised. The SARS-CoV-2 RNA is generally  detectable in upper and lower respiratory sp ecimens during the acute  phase of infection. The expected result is Negative. Fact Sheet for Patients:  StrictlyIdeas.no Fact Sheet for Healthcare Providers: BankingDealers.co.za This test is not yet approved or cleared by the [place] FDA and has been authorized for detection and/or diagnosis of SARS-CoV-2 by FDA under an Emergency Use Authorization (EUA).  This EUA will remain in effect (meaning this test can be used) for the duration of the COVID-19 declaration under Section 564(b)(1) of the Act, 21 U.S.C. section 360bbb-3(b)(1), unless the authorization is terminated or revoked sooner. Performed at [facility], [place] [address], [place], [place] [address]   MRSA PCR Screening     Status: None   Collection Time: 11/17/18 12:15 AM   Specimen: Nasal Mucosa; Nasopharyngeal  Result Value Ref Range Status   MRSA by PCR NEGATIVE NEGATIVE Final    Comment:        The GeneXpert MRSA Assay (FDA approved for NASAL specimens only), is one component of a comprehensive MRSA colonization surveillance program. It is not intended to diagnose MRSA infection nor to guide or monitor treatment for MRSA infections. Performed at [facility], [place] [address] [place], [place] [address]   Culture, blood (Routine X 2) w Reflex to ID Panel     Status: Abnormal   Collection Time: 11/17/18 12:31 AM   Specimen: BLOOD  Result Value Ref Range Status    Specimen Description BLOOD LEFT ANTECUBITAL  Final   Special Requests   Final    BOTTLES DRAWN AEROBIC AND ANAEROBIC Blood Culture adequate volume   Culture  Setup Time   Final    GRAM POSITIVE COCCI AEROBIC BOTTLE ONLY Gram Stain Report Called to,Read Back By and Verified WithTillman Sers PHARMD [id number] 11/21/18 A BROWNING    Culture (A)  Final    MICROCOCCUS SPECIES CORRECTED ON 10/23 AT 0751: PREVIOUSLY REPORTED AS NO GROWTH 5 DAYS Standardized susceptibility testing for this organism is not available. Performed at [facility], [place] [address] [place], [place] [address]    Report Status 11/22/2018 FINAL  Final  Culture, blood (Routine X 2) w Reflex to ID Panel     Status: None   Collection Time: 11/17/18 12:31 AM   Specimen: BLOOD  Result Value Ref Range Status   Specimen Description BLOOD LEFT ARM  Final   Special Requests   Final    BOTTLES DRAWN AEROBIC AND ANAEROBIC Blood Culture results may not be optimal due to an inadequate volume of blood received in culture bottles   Culture   Final    NO GROWTH 5 DAYS Performed at [facility], [place] [address] [place], [place] [address]    Report Status 11/22/2018 FINAL  Final     Patient was seen and examined on the day of discharge and was found to be in stable condition. Time coordinating discharge:  35 minutes including assessment and coordination of care, as well as examination of the patient.   SIGNED:  Dessa Phi, DO [facility] 11/22/2018, 11:37 AM

## 2018-11-22 NOTE — Progress Notes (Signed)
  PROGRESS NOTE  Patient was supposed to discharge today. Notified by RN that patient does not feel like she can go home today, feeling uncomfortable, nauseous. Will hold off discharge tonight and re-evaluate her symptoms in AM.   Dessa Phi, DO [facility] 11/22/2018, 4:59 PM  Available via Epic secure chat 7am-7pm After these hours, please refer to coverage provider listed on [email or web address]

## 2018-11-23 ENCOUNTER — Inpatient Hospital Stay (HOSPITAL_COMMUNITY): Payer: Medicare HMO

## 2018-11-23 DIAGNOSIS — I471 Supraventricular tachycardia: Secondary | ICD-10-CM | POA: Diagnosis not present

## 2018-11-23 LAB — HEPATIC FUNCTION PANEL
ALT: 16 U/L (ref 0–44)
AST: 21 U/L (ref 15–41)
Albumin: 2.8 g/dL — ABNORMAL LOW (ref 3.5–5.0)
Alkaline Phosphatase: 102 U/L (ref 38–126)
Bilirubin, Direct: <0.1 mg/dL (ref 0.0–0.2)
Total Bilirubin: 0.7 mg/dL (ref 0.3–1.2)
Total Protein: 6.4 g/dL — ABNORMAL LOW (ref 6.5–8.1)

## 2018-11-23 LAB — BASIC METABOLIC PANEL
Anion gap: 13 (ref 5–15)
BUN: 29 mg/dL — ABNORMAL HIGH (ref 8–23)
CO2: 36 mmol/L — ABNORMAL HIGH (ref 22–32)
Calcium: 9.1 mg/dL (ref 8.9–10.3)
Chloride: 89 mmol/L — ABNORMAL LOW (ref 98–111)
Creatinine, Ser: 1.6 mg/dL — ABNORMAL HIGH (ref 0.44–1.00)
GFR calc Af Amer: 35 mL/min — ABNORMAL LOW (ref >60)
GFR calc non Af Amer: 30 mL/min — ABNORMAL LOW (ref >60)
Glucose, Bld: 111 mg/dL — ABNORMAL HIGH (ref 70–99)
Potassium: 4 mmol/L (ref 3.5–5.1)
Sodium: 138 mmol/L (ref 135–145)

## 2018-11-23 MED ORDER — PROMETHAZINE HCL 25 MG/ML IJ SOLN: 12.5000 mg | Freq: Once | INTRAMUSCULAR | Status: DC

## 2018-11-23 MED ORDER — PROMETHAZINE HCL 25 MG/ML IJ SOLN
12.5000 mg | Freq: Four times a day (QID) | INTRAMUSCULAR | Status: DC | PRN
  Administered 2018-11-23: 17:00:00 12.5 mg via INTRAVENOUS
  Filled 2018-11-23: qty 1

## 2018-11-23 MED ORDER — DOXYCYCLINE HYCLATE 100 MG PO TABS
100.0000 mg | ORAL_TABLET | Freq: Two times a day (BID) | ORAL | Status: AC
  Administered 2018-11-23 (×2): 100 mg via ORAL
  Filled 2018-11-23 (×2): qty 1

## 2018-11-23 MED ORDER — SODIUM CHLORIDE 0.9 % IV BOLUS
500.0000 mL | Freq: Once | INTRAVENOUS | Status: AC
  Administered 2018-11-23: 14:00:00 500 mL via INTRAVENOUS

## 2018-11-23 NOTE — Progress Notes (Signed)
Drained pleurx for 250cc of serous drainage. Patient tolerated well.

## 2018-11-23 NOTE — Progress Notes (Signed)
New orders rec'd to give 500cc NS bolus

## 2018-11-23 NOTE — Progress Notes (Addendum)
  PROGRESS NOTE  Patient re-evaluated in late morning due to complaints of nausea. I have stopped her chemo medication for now. Gave additional dose phenergan. KUB without acute findings.  Called by RN this afternoon due to HR 140s, BP 93/66. Ordered 500cc IVF to bring up BP. Rechecked on patient and patient's HR now 77, BP 129/80.    Dessa Phi, DO [facility] 11/23/2018, 2:33 PM  Available via Epic secure chat 7am-7pm After these hours, please refer to coverage provider listed on [email or web address]

## 2018-11-23 NOTE — Progress Notes (Signed)
Paged MD HR 138-140's sustaining, bp 93/66

## 2018-11-23 NOTE — Progress Notes (Signed)
PROGRESS NOTE    GENNETTE Tucker  [id number] DOB: 1940/02/18 DOA: 11/16/2018 PCP: [name], MD     Brief Narrative:  Beverly Tucker a 78 year old female with past medical history of hypertension, GERD, anxiety, metastatic right breast cancer recently started on abemaciclib on 10/12 and on letrozole (follows with Dr. Roney Marion appointment 10/5), recurrent malignant pleural effusion with Pleurx, chronic diastolic heart failure, atrial fibrillation on Eliquis, SVT, pneumothorax, CKD 3 who presented to the ED on 10/17 with 1 week of worsening shortness of breath, leg edema and palpitations. In the ED, patient was found to have SVT with heart rate up to 180s and was given 6 mg of adenosine with conversion to NSR. She was found to have presentation troponin 32, WBC 13.7, BNP 183, lactic acid 1.8, Covid negative. Chest x-ray showed small bilateral pleural effusions and somewhat new hazy opacity and started on empiric azithromycin. She was admitted for acute on chronic diastolic heart failure exacerbation received IV diuresis. Overnight 10/17-10/18 patient had a recurrence of SVT which continuously converted to NSR without any medical intervention. Recurrent SVT episode 10/21-10/22 and required adenosine again and metoprolol dose further adjusted. Patient has remained in NSR since then.   New events last 24 hours / Subjective: She was set to discharge home yesterday with home health but in the afternoon, developed nausea requiring Zofran and Phenergan.  This morning, she states that she continues to feel nauseous without any vomiting episodes.  Denies other complaints such as abdominal pain.  Has not had any further diarrhea since yesterday.  She was only able to have one bite of muffin before feeling nauseous again.  Assessment & Plan:   Principal Problem:   SVT (supraventricular tachycardia) (HCC) Active Problems:   CKD (chronic kidney disease) stage 3, GFR 30-59 ml/min   HTN  (hypertension)   Metastatic breast cancer (HCC)   Pleural effusion   Acute on chronic diastolic CHF (congestive heart failure) (HCC)   Elevated troponin   Leukocytosis   Atrial fibrillation, chronic (HCC)   Malnutrition of moderate degree    Acute hypoxic respiratory failure secondary to decompensated heart failure -Continue to wean nasal cannula O2 to room air. Home desat screen for O2 needs  -Continuelasix  SVT -Status post adenosine 6 mg x 1 in ED with conversion to NSR and again with brief recurrence of SVT which spontaneously converted to NSR 10/19. Again with recurrence of hemodynamically stable SVT 10/20 during BM prior to receiving her Lopressor. Recurrent SVT episode 10/21-10/22 and required adenosine again. -Continue metoprolol, dose increased  -Now in NSR   Paroxysmal atrial fibrillation with RVR -CHA2DS2-VASc: 5. On Eliquis.  -Continue metoprolol   Acute on chronic diastolic heart failure in setting of Afib with RVR -Last echo (10/15/18) EF 65 -70%, EF 55-60% this admission -Continue Lasix, beta-blocker -Appreciate cardiology  Elevated troponin -Troponin 32 which peaked at 49. Suspect demand ischemia in setting of heart failure and SVT.   AKI on CKD 3a -Cr has been ranging between 1.3-1.6, suspect cardio renal   Hypertension -Stable  Metastatic breast cancer -Follows with Dr. [name], Oncology. On letrozole and abemaciclib (recently started abemaciclib 10/12).Has outpatient follow-up 11/3. Will be started on Zometa outpatient for bone mets  Recurrent malignant pleural effusion -Pleurx drain placed 10/9.Drain every other day  Leukocytosis -Resolved. Afebrile. Azythromycinadded onadmissionfor empiric coveragewhich was switched to doxycycline 10/19. Shortness of breath and cough is likely due to acute on chronic heart failure, although she does have a persistent infiltrate on  xray and not feeling well.  -Completed total 5 dose of  antibiotics.  Leukocytosis now resolved  Blood culture contamination -1 out of 4 bottles with micrococcus species reported.  This likely represents blood culture contamination rather than true infection   DVT prophylaxis: Eliquis Code Status: Full code Family Communication: None at bedside, discussed with son over the phone today  Disposition Plan: Home health on discharge when nausea improved   Consultants:   Cardiology   Oncology   Procedures:   None   Antimicrobials:  Anti-infectives (From admission, onward)   Start     Dose/Rate Route Frequency Ordered Stop   11/22/18 0000  cefdinir (OMNICEF) 300 MG capsule     300 mg Oral Every 12 hours 11/22/18 1136 11/23/18 2359   11/22/18 0000  doxycycline (VIBRAMYCIN) 50 MG capsule     100 mg Oral 2 times daily 11/22/18 1136 11/23/18 2359   11/20/18 1500  cefdinir (OMNICEF) capsule 300 mg     300 mg Oral Every 12 hours 11/20/18 1445     11/19/18 1930  cefTRIAXone (ROCEPHIN) 1 g in sodium chloride 0.9 % 100 mL IVPB  Status:  Discontinued     1 g 200 mL/hr over 30 Minutes Intravenous Every 24 hours 11/19/18 1840 11/20/18 1445   11/18/18 1000  azithromycin (ZITHROMAX) tablet 250 mg  Status:  Discontinued     250 mg Oral Daily 11/17/18 0007 11/18/18 0900   11/18/18 1000  doxycycline (VIBRA-TABS) tablet 100 mg     100 mg Oral Every 12 hours 11/18/18 0900 11/21/18 2142   11/17/18 1000  azithromycin (ZITHROMAX) tablet 500 mg     500 mg Oral Daily 11/17/18 0007 11/17/18 0939       Objective: Vitals:   11/22/18 2334 11/23/18 0327 11/23/18 0723 11/23/18 0920  BP: (!) 140/55 (!) 150/66 (!) 150/65 (!) 150/79  Pulse: 62 70 75 87  Resp: 16 19 18    Temp: 98.9 F (37.2 C) 98.3 F (36.8 C) 98.5 F (36.9 C)   TempSrc: Oral Oral Oral   SpO2: 94% 94% 95%   Weight:      Height:        Intake/Output Summary (Last 24 hours) at 11/23/2018 0932 Last data filed at 11/23/2018 0927 Gross per 24 hour  Intake 483 ml  Output 300 ml  Net  183 ml   Filed Weights   11/20/18 0430 11/20/18 0537 11/21/18 0329  Weight: 80.9 kg 80.2 kg 79.2 kg    Examination: General exam: Appears calm and comfortable  Respiratory system: Clear to auscultation anteriorly. Respiratory effort normal. Cardiovascular system: S1 & S2 heard, RRR. No pedal edema. Gastrointestinal system: Abdomen is nondistended, soft and nontender. Normal bowel sounds heard. Central nervous system: Alert and oriented. Non focal exam. Speech clear  Extremities: Symmetric in appearance bilaterally  Skin: No rashes, lesions or ulcers on exposed skin  Psychiatry: Judgement and insight appear stable. Mood & affect appropriate.    Data Reviewed: I have personally reviewed following labs and imaging studies  CBC: Recent Labs  Lab 11/16/18 1840 11/17/18 0031 11/18/18 0322 11/19/18 0227 11/22/18 0240  WBC 13.7* 11.9* 9.7 8.5 9.9  HGB 11.3* 9.9* 10.2* 10.0* 10.0*  HCT 37.5 32.3* 33.0* 32.2* 32.6*  MCV 95.7 94.7 93.0 92.5 92.6  PLT 462* 409* 374 392 [id number]*   Basic Metabolic Panel: Recent Labs  Lab 11/16/18 1840 11/17/18 0031 11/18/18 0322 11/18/18 1426 11/19/18 0227 11/20/18 0251 11/21/18 0236 11/22/18 0240 11/23/18 0252  NA 138 141  141 135 139 138 137 137 138  K 4.7 3.9 4.1 4.0 3.4* 3.9 4.2 3.9 4.0  CL 98 99 95* 89* 90* 92* 91* 90* 89*  CO2 27 31 36* 32 36* 36* 33* 34* 36*  GLUCOSE 145* 114* 121* 162* 120* 109* 117* 103* 111*  BUN 15 12 15 15 18 22  25* 27* 29*  CREATININE 1.29* 1.19* 1.39* 1.48* 1.59* 1.38* 1.60* 1.56* 1.60*  CALCIUM 9.1 8.8* 8.5* 9.0 8.6* 8.7* 9.0 8.8* 9.1  MG 2.3 2.0 1.9 1.8 1.8  --   --   --   --    GFR: Estimated Creatinine Clearance: 30.3 mL/min (A) (by C-G formula based on SCr of 1.6 mg/dL (H)). Liver Function Tests: Recent Labs  Lab 11/16/18 1840  AST 37  ALT 25  ALKPHOS 89  BILITOT 0.7  PROT 6.9  ALBUMIN 3.0*   No results for input(s): LIPASE, AMYLASE in the last 168 hours. No results for input(s): AMMONIA in the  last 168 hours. Coagulation Profile: No results for input(s): INR, PROTIME in the last 168 hours. Cardiac Enzymes: No results for input(s): CKTOTAL, CKMB, CKMBINDEX, TROPONINI in the last 168 hours. BNP (last 3 results) No results for input(s): PROBNP in the last 8760 hours. HbA1C: No results for input(s): HGBA1C in the last 72 hours. CBG: No results for input(s): GLUCAP in the last 168 hours. Lipid Profile: No results for input(s): CHOL, HDL, LDLCALC, TRIG, CHOLHDL, LDLDIRECT in the last 72 hours. Thyroid Function Tests: No results for input(s): TSH, T4TOTAL, FREET4, T3FREE, THYROIDAB in the last 72 hours. Anemia Panel: No results for input(s): VITAMINB12, FOLATE, FERRITIN, TIBC, IRON, RETICCTPCT in the last 72 hours. Sepsis Labs: Recent Labs  Lab 11/16/18 1840 11/17/18 0030  LATICACIDVEN 1.8 1.6    Recent Results (from the past 240 hour(s))  SARS Coronavirus 2 by RT PCR (hospital order, performed in [facility] hospital lab) Nasopharyngeal Nasopharyngeal Swab     Status: None   Collection Time: 11/16/18  7:37 PM   Specimen: Nasopharyngeal Swab  Result Value Ref Range Status   SARS Coronavirus 2 NEGATIVE NEGATIVE Final    Comment: (NOTE) If result is NEGATIVE SARS-CoV-2 target nucleic acids are NOT DETECTED. The SARS-CoV-2 RNA is generally detectable in upper and lower  respiratory specimens during the acute phase of infection. The lowest  concentration of SARS-CoV-2 viral copies this assay can detect is 250  copies / mL. A negative result does not preclude SARS-CoV-2 infection  and should not be used as the sole basis for treatment or other  patient management decisions.  A negative result may occur with  improper specimen collection / handling, submission of specimen other  than nasopharyngeal swab, presence of viral mutation(s) within the  areas targeted by this assay, and inadequate number of viral copies  (<250 copies / mL). A negative result must be combined with  clinical  observations, patient history, and epidemiological information. If result is POSITIVE SARS-CoV-2 target nucleic acids are DETECTED. The SARS-CoV-2 RNA is generally detectable in upper and lower  respiratory specimens dur ing the acute phase of infection.  Positive  results are indicative of active infection with SARS-CoV-2.  Clinical  correlation with patient history and other diagnostic information is  necessary to determine patient infection status.  Positive results do  not rule out bacterial infection or co-infection with other viruses. If result is PRESUMPTIVE POSTIVE SARS-CoV-2 nucleic acids MAY BE PRESENT.   A presumptive positive result was obtained on the submitted specimen  and confirmed on repeat testing.  While 2019 novel coronavirus  (SARS-CoV-2) nucleic acids may be present in the submitted sample  additional confirmatory testing may be necessary for epidemiological  and / or clinical management purposes  to differentiate between  SARS-CoV-2 and other Sarbecovirus currently known to infect humans.  If clinically indicated additional testing with an alternate test  methodology [phone]) is advised. The SARS-CoV-2 RNA is generally  detectable in upper and lower respiratory sp ecimens during the acute  phase of infection. The expected result is Negative. Fact Sheet for Patients:  StrictlyIdeas.no Fact Sheet for Healthcare Providers: BankingDealers.co.za This test is not yet approved or cleared by the [place] FDA and has been authorized for detection and/or diagnosis of SARS-CoV-2 by FDA under an Emergency Use Authorization (EUA).  This EUA will remain in effect (meaning this test can be used) for the duration of the COVID-19 declaration under Section 564(b)(1) of the Act, 21 U.S.C. section 360bbb-3(b)(1), unless the authorization is terminated or revoked sooner. Performed at [facility], [place]  [address], [place], [place] [address]   MRSA PCR Screening     Status: None   Collection Time: 11/17/18 12:15 AM   Specimen: Nasal Mucosa; Nasopharyngeal  Result Value Ref Range Status   MRSA by PCR NEGATIVE NEGATIVE Final    Comment:        The GeneXpert MRSA Assay (FDA approved for NASAL specimens only), is one component of a comprehensive MRSA colonization surveillance program. It is not intended to diagnose MRSA infection nor to guide or monitor treatment for MRSA infections. Performed at [facility], [place] [address], [place], [place] [address]   Culture, blood (Routine X 2) w Reflex to ID Panel     Status: Abnormal   Collection Time: 11/17/18 12:31 AM   Specimen: BLOOD  Result Value Ref Range Status   Specimen Description BLOOD LEFT ANTECUBITAL  Final   Special Requests   Final    BOTTLES DRAWN AEROBIC AND ANAEROBIC Blood Culture adequate volume   Culture  Setup Time   Final    GRAM POSITIVE COCCI AEROBIC BOTTLE ONLY Gram Stain Report Called to,Read Back By and Verified WithTillman Sers PHARMD [id number] 11/21/18 A BROWNING    Culture (A)  Final    MICROCOCCUS SPECIES CORRECTED ON 10/23 AT 0751: PREVIOUSLY REPORTED AS NO GROWTH 5 DAYS Standardized susceptibility testing for this organism is not available. Performed at [facility], [place] [address], [place], [place] [address]    Report Status 11/22/2018 FINAL  Final  Culture, blood (Routine X 2) w Reflex to ID Panel     Status: None   Collection Time: 11/17/18 12:31 AM   Specimen: BLOOD  Result Value Ref Range Status   Specimen Description BLOOD LEFT ARM  Final   Special Requests   Final    BOTTLES DRAWN AEROBIC AND ANAEROBIC Blood Culture results may not be optimal due to an inadequate volume of blood received in culture bottles   Culture   Final    NO GROWTH 5 DAYS Performed at [facility], [place] [address], [place], [place] [address]    Report Status 11/22/2018 FINAL  Final      Radiology Studies: No  results found.    Scheduled Meds: . abemaciclib  50 mg Oral BID  . apixaban  5 mg Oral BID  . cefdinir  300 mg Oral Q12H  . dextromethorphan-guaiFENesin  1 tablet Oral BID  . feeding  supplement (ENSURE ENLIVE)  237 mL Oral BID BM  . furosemide  40 mg Oral Daily  . letrozole  2.5 mg Oral Daily  . metoprolol tartrate  5 mg Intravenous Once  . metoprolol tartrate  75 mg Oral BID  . sodium chloride flush  3 mL Intravenous Q12H  . Vitamin D (Ergocalciferol)  50,000 Units Oral Q Mon   Continuous Infusions: . sodium chloride       LOS: 7 days      Time spent: 30 minutes   Dessa Phi, DO [facility] 11/23/2018, 9:32 AM   Available via Epic secure chat 7am-7pm After these hours, please refer to coverage provider listed on [email or web address]

## 2018-11-23 NOTE — Progress Notes (Signed)
After bolus given HR dropped

## 2018-11-23 NOTE — Plan of Care (Signed)
  Problem: Education: Goal: Ability to verbalize understanding of medication therapies will improve Outcome: Progressing   Problem: Activity: Goal: Capacity to carry out activities will improve Outcome: Progressing   Problem: Activity: Goal: Risk for activity intolerance will decrease Outcome: Progressing   Problem: Nutrition: Goal: Adequate nutrition will be maintained Outcome: Progressing   Problem: Coping: Goal: Level of anxiety will decrease Outcome: Progressing   Problem: Pain Managment: Goal: General experience of comfort will improve Outcome: Progressing   Problem: Safety: Goal: Ability to remain free from injury will improve Outcome: Progressing   Problem: Skin Integrity: Goal: Risk for impaired skin integrity will decrease Outcome: Progressing

## 2018-11-24 DIAGNOSIS — I471 Supraventricular tachycardia: Secondary | ICD-10-CM | POA: Diagnosis not present

## 2018-11-24 LAB — BASIC METABOLIC PANEL
Anion gap: 11 (ref 5–15)
BUN: 29 mg/dL — ABNORMAL HIGH (ref 8–23)
CO2: 37 mmol/L — ABNORMAL HIGH (ref 22–32)
Calcium: 8.8 mg/dL — ABNORMAL LOW (ref 8.9–10.3)
Chloride: 90 mmol/L — ABNORMAL LOW (ref 98–111)
Creatinine, Ser: 1.46 mg/dL — ABNORMAL HIGH (ref 0.44–1.00)
GFR calc Af Amer: 39 mL/min — ABNORMAL LOW (ref >60)
GFR calc non Af Amer: 34 mL/min — ABNORMAL LOW (ref >60)
Glucose, Bld: 102 mg/dL — ABNORMAL HIGH (ref 70–99)
Potassium: 3.6 mmol/L (ref 3.5–5.1)
Sodium: 138 mmol/L (ref 135–145)

## 2018-11-24 MED ORDER — ONDANSETRON 4 MG PO TBDP: 4.0000 mg | ORAL_TABLET | Freq: Three times a day (TID) | ORAL | 0 refills | Status: DC | PRN

## 2018-11-24 MED ORDER — LOPERAMIDE HCL 2 MG PO CAPS: 2.0000 mg | ORAL_CAPSULE | ORAL | 0 refills | Status: AC | PRN

## 2018-11-24 NOTE — Plan of Care (Signed)
  Problem: Education: Goal: Ability to demonstrate management of disease process will improve 11/24/2018 1249 by Shanon Ace, RN Outcome: Adequate for Discharge 11/24/2018 0757 by Shanon Ace, RN Outcome: Progressing Goal: Ability to verbalize understanding of medication therapies will improve 11/24/2018 1249 by Shanon Ace, RN Outcome: Adequate for Discharge 11/24/2018 0757 by Shanon Ace, RN Outcome: Progressing Goal: Individualized Educational Video(s) 11/24/2018 1249 by Shanon Ace, RN Outcome: Adequate for Discharge 11/24/2018 0757 by Shanon Ace, RN Outcome: Progressing   Problem: Activity: Goal: Capacity to carry out activities will improve 11/24/2018 1249 by Shanon Ace, RN Outcome: Adequate for Discharge 11/24/2018 0757 by Shanon Ace, RN Outcome: Progressing   Problem: Cardiac: Goal: Ability to achieve and maintain adequate cardiopulmonary perfusion will improve 11/24/2018 1249 by Shanon Ace, RN Outcome: Adequate for Discharge 11/24/2018 0757 by Shanon Ace, RN Outcome: Progressing   Problem: Education: Goal: Knowledge of General Education information will improve Description: Including pain rating scale, medication(s)/side effects and non-pharmacologic comfort measures 11/24/2018 1249 by Shanon Ace, RN Outcome: Adequate for Discharge 11/24/2018 0757 by Shanon Ace, RN Outcome: Progressing   Problem: Health Behavior/Discharge Planning: Goal: Ability to manage health-related needs will improve 11/24/2018 1249 by Shanon Ace, RN Outcome: Adequate for Discharge 11/24/2018 0757 by Shanon Ace, RN Outcome: Progressing   Problem: Clinical Measurements: Goal: Ability to maintain clinical measurements within normal limits will improve 11/24/2018 1249 by Shanon Ace, RN Outcome: Adequate for Discharge 11/24/2018 0757 by Shanon Ace, RN Outcome: Progressing Goal: Will remain free from infection 11/24/2018 1249 by Shanon Ace,  RN Outcome: Adequate for Discharge 11/24/2018 0757 by Shanon Ace, RN Outcome: Progressing Goal: Diagnostic test results will improve 11/24/2018 1249 by Shanon Ace, RN Outcome: Adequate for Discharge 11/24/2018 0757 by Shanon Ace, RN Outcome: Progressing Goal: Respiratory complications will improve 11/24/2018 1249 by Shanon Ace, RN Outcome: Adequate for Discharge 11/24/2018 0757 by Shanon Ace, RN Outcome: Progressing Goal: Cardiovascular complication will be avoided 11/24/2018 1249 by Shanon Ace, RN Outcome: Adequate for Discharge 11/24/2018 0757 by Shanon Ace, RN Outcome: Progressing   Problem: Activity: Goal: Risk for activity intolerance will decrease 11/24/2018 1249 by Shanon Ace, RN Outcome: Adequate for Discharge 11/24/2018 0757 by Shanon Ace, RN Outcome: Progressing   Problem: Nutrition: Goal: Adequate nutrition will be maintained 11/24/2018 1249 by Shanon Ace, RN Outcome: Adequate for Discharge 11/24/2018 0757 by Shanon Ace, RN Outcome: Progressing   Problem: Coping: Goal: Level of anxiety will decrease 11/24/2018 1249 by Shanon Ace, RN Outcome: Adequate for Discharge 11/24/2018 0757 by Shanon Ace, RN Outcome: Progressing   Problem: Elimination: Goal: Will not experience complications related to bowel motility 11/24/2018 1249 by Shanon Ace, RN Outcome: Adequate for Discharge 11/24/2018 0757 by Shanon Ace, RN Outcome: Progressing Goal: Will not experience complications related to urinary retention 11/24/2018 1249 by Shanon Ace, RN Outcome: Adequate for Discharge 11/24/2018 0757 by Shanon Ace, RN Outcome: Progressing   Problem: Pain Managment: Goal: General experience of comfort will improve 11/24/2018 1249 by Shanon Ace, RN Outcome: Adequate for Discharge 11/24/2018 0757 by Shanon Ace, RN Outcome: Progressing   Problem: Safety: Goal: Ability to remain free from injury will improve 11/24/2018  1249 by Shanon Ace, RN Outcome: Adequate for Discharge 11/24/2018 0757 by Shanon Ace, RN Outcome: Progressing

## 2018-11-24 NOTE — TOC Transition Note (Addendum)
Transition of Care [facility]) - CM/SW Discharge Note   Patient Details  Name: Beverly Tucker MRN: [id number] Date of Birth: Aug 05, 1940  Transition of Care [facility]) CM/SW Contact:  Carles Collet, RN Phone Number: 11/24/2018, 9:12 AM   Clinical Narrative:    spoke w patient, she states she has not yet received RW to room, notified [place] w [employer] that she needs one and it will be delivered to room prior to DC. Patient has home oxygen and she states that her son will bring tank for transport to take her home with. Notified Barb Merino that she will DC today to resume [facility] services. No other CM needs.   Addendum, Per [employer] they attempted to deliver a RW to her Friday, she declined as it would be out of pocket since she used her insurance for a rollator in September.     Final next level of care: [place] Barriers to Discharge: No Barriers Identified   Patient Goals and CMS Choice Patient states their goals for this hospitalization and ongoing recovery are:: get better CMS Medicare.gov Compare Post Acute Care list provided to:: Patient Choice offered to / list presented to : Patient  Discharge Placement                       Discharge Plan and Services In-house Referral: NA Discharge Planning Services: CM Consult Post Acute Care Choice: [place]          DME Arranged: Gilford Rile rolling DME Agency: [employer] Date DME Agency Contacted: 11/24/18 Time DME Agency Contacted: [id number] Representative spoke with at DME Agency: [place]: RN, PT, OT, Nurse's Aide [place] Agency: [place] Date [place]: 11/24/18 Time [place]: [phone] Representative spoke with at [place]: [place] ([place]) Interventions     Readmission Risk Interventions Readmission Risk Prevention Plan 10/23/2018  Transportation Screening Complete  PCP or Specialist Appt within 5-7 Days Complete  Home Care Screening Complete   Medication Review (RN CM) Complete  Some recent data might be hidden

## 2018-11-24 NOTE — Plan of Care (Cosign Needed)
  Problem: Education: Goal: Ability to demonstrate management of disease process will improve Outcome: Progressing Goal: Ability to verbalize understanding of medication therapies will improve Outcome: Progressing Goal: Individualized Educational Video(s) Outcome: Progressing   Problem: Activity: Goal: Capacity to carry out activities will improve Outcome: Progressing   Problem: Cardiac: Goal: Ability to achieve and maintain adequate cardiopulmonary perfusion will improve Outcome: Progressing   Problem: Education: Goal: Knowledge of General Education information will improve Description: Including pain rating scale, medication(s)/side effects and non-pharmacologic comfort measures Outcome: Progressing   Problem: Health Behavior/Discharge Planning: Goal: Ability to manage health-related needs will improve Outcome: Progressing   Problem: Clinical Measurements: Goal: Ability to maintain clinical measurements within normal limits will improve Outcome: Progressing Goal: Will remain free from infection Outcome: Progressing Goal: Diagnostic test results will improve Outcome: Progressing Goal: Respiratory complications will improve Outcome: Progressing Goal: Cardiovascular complication will be avoided Outcome: Progressing   Problem: Clinical Measurements: Goal: Will remain free from infection Outcome: Progressing   Problem: Activity: Goal: Risk for activity intolerance will decrease Outcome: Progressing   Problem: Elimination: Goal: Will not experience complications related to bowel motility Outcome: Progressing Goal: Will not experience complications related to urinary retention Outcome: Progressing   Problem: Coping: Goal: Level of anxiety will decrease Outcome: Progressing   Problem: Safety: Goal: Ability to remain free from injury will improve Outcome: Progressing   Problem: Skin Integrity: Goal: Risk for impaired skin integrity will decrease Outcome:  Progressing

## 2018-11-24 NOTE — Progress Notes (Signed)
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

## 2018-11-24 NOTE — Discharge Instructions (Signed)
Supraventricular Tachycardia, Adult Supraventricular tachycardia (SVT) is a kind of abnormal heartbeat. It makes your heart beat very fast and then beat at a normal speed. A normal resting heartbeat is 60-100 times a minute. This condition can make your heart beat more than 150 times a minute. Times of having a fast heartbeat (episodes) can be scary, but they are usually not dangerous. In some cases, they may lead to heart failure if:  They happen many times per day.  Last longer than a few seconds. What are the causes?  A normal heartbeat starts when an area called the sinoatrial node sends out an electrical signal. In SVT, other areas of the heart send out signals that get in the way of the signal from the sinoatrial node. What increases the risk? You are more likely to develop this condition if you are:  20-68 years old.  A woman. The following factors may make you more likely to develop this condition:  Stress.  Tiredness.  Smoking.  Stimulant drugs, such as cocaine and methamphetamine.  Alcohol.  Caffeine.  Pregnancy.  Feeling worried or nervous (anxiety). What are the signs or symptoms?  A pounding heart.  A feeling that your heart is skipping beats (palpitations).  Weakness.  Trouble getting enough air.  Pain or tightness in your chest.  Feeling like you are going to pass out (faint).  Feeling worried or nervous.  Dizziness.  Sweating.  Feeling sick to your stomach (nausea).  Passing out.  Tiredness. Sometimes, there are no symptoms. How is this treated?  Vagal nerve stimulation. Ways to do this include: ? Holding your breath and pushing, as though you are pooping (having a bowel movement). ? Massaging an area on one side of your neck. Do not try this yourself. Only a doctor should do this. If done the wrong way, it can lead to a stroke. ? Bending forward with your head between your legs. ? Coughing while bending forward with your head between  your legs. ? Closing your eyes and massaging your eyeballs. Ask a doctor how to do this.  Medicines that prevent attacks.  Medicine to stop an attack given through an IV tube at the hospital.  A small electric shock (cardioversion) that stops an attack.  Radiofrequency ablation. In this procedure, a small, thin tube (catheter) is used to send energy to the area that is causing the rapid heartbeats. If you do not have symptoms, you may not need treatment. Follow these instructions at home: Stress  Avoid things that make you feel stressed.  To deal with stress, try: ? Doing yoga or meditation, or being out in nature. ? Listening to relaxing music. ? Doing deep breathing. ? Taking steps to be healthy, such as getting lots of sleep, exercising, and eating a balanced diet. ? Talking with a mental health doctor. Lifestyle   Try to get at least 7 hours of sleep each night.  Do not use any products that contain nicotine or tobacco, such as cigarettes, e-cigarettes, and chewing tobacco. If you need help quitting, ask your doctor.  Be aware of how alcohol affects you. ? If alcohol gives you a fast heartbeat, do not drink alcohol. ? If alcohol does not seem to give you a fast heartbeat, limit alcohol use to no more than 1 drink a day for women who are not pregnant, and 2 drinks a day for men. In the [place] one drink is one of these: ? 12 oz of beer (355 mL). ? 5  oz of wine (148 mL). ? 1 oz of hard liquor (44 mL).  Be aware of how caffeine affects you. ? If caffeine gives you a fast heartbeat, do not eat, drink, or use anything with caffeine in it. ? If caffeine does not seem to give you a fast heartbeat, limit how much caffeine you eat, drink, or use.  Do not use stimulant drugs. If you need help quitting, ask your doctor. General instructions  Stay at a healthy weight.  Exercise regularly. Ask your doctor about good activities for you. Try one or a mixture of these: ? 150 minutes  a week of gentle exercise, like walking or yoga. ? 75 minutes a week of exercise that is very active, like running or swimming.  Do vagus nerve treatments to slow down your heartbeat as told by your doctor.  Take over-the-counter and prescription medicines only as told by your doctor.  Keep all follow-up visits as told by your doctor. This is important. Contact a doctor if:  You have a fast heartbeat more often.  Times of having a fast heartbeat last longer than before.  Home treatments to slow down your heartbeat do not help.  You have new symptoms. Get help right away if:  You have chest pain.  Your symptoms get worse.  You have trouble breathing.  Your heart beats very fast for more than 20 minutes.  You pass out. These symptoms may be an emergency. Do not wait to see if the symptoms will go away. Get medical help right away. Call your local emergency services (911 in the [place] Do not drive yourself to the hospital. Summary  SVT is a type of abnormal heart beat.  This condition can make your heart beat more than 150 times a minute.  Treatment depends on how often the condition happens and your symptoms. This information is not intended to replace advice given to you by your health care provider. Make sure you discuss any questions you have with your health care provider. Document Released: 01/16/2005 Document Revised: 12/04/2017 Document Reviewed: 12/04/2017 Elsevier Patient Education  2020 [employer].    How to Change Your Wound Dressing A dressing is a material that is placed in and over a wound to help the wound heal. It protects the wound from bacteria, which could cause the wound to become infected. A dressing also protects the wound from further injury and keeps it from becoming too dry or too wet. There are several types of wound dressings. Some examples are:  Bandages.  Gauze pads.  Foam pads.  Antimicrobial dressings. These dressings prevent or treat  infection and may contain an antiseptic such as silver or iodine.  Calcium alginate. These are general guidelines. Follow your health care provider's instructions about what dressing supplies to use and how to change your wound dressing. What are the risks? It is usually safe to use and change a dressing. The most common problem is skin irritation or a rash from the adhesive tape that is used with the dressing. However, more serious problems can develop, such as:  Bleeding.  Infection. Supplies needed: Set up a clean area for wound care. You will need:  A disposable garbage bag that is open and ready to use.  Hand sanitizer.  Recommended cleaning solution as told by your health care provider, such as: ? Germ-free (sterile) water. ? Sterile salt water (saline). ? Wound cleanser.  New wound dressing material. Make sure to open the dressing package so the dressing  remains on the inside of the package. You may also need the following in your clean area:  A box of vinyl gloves.  Adhesive tape.  Adhesive remover.  Skin protectant. This may be a wipe, film, or spray.  Clean or sterile scissors.  A cotton-tipped applicator. How to change your dressing How often you change your dressing will depend on your wound. Change the dressing as often as told by your health care provider. Your health care provider may change your dressing, or a family member, friend, or caregiver may be shown how to change the dressing. It is important to:  Wash your hands before and after each dressing change. If soap and water are not available, use hand sanitizer.  Wear gloves and protective clothing while changing a dressing. This may include eye protection.  Never let anyone change your dressing if he or she has an infection, a skin condition, or a skin wound or cut of any size. Preparing to change your dressing  Take a shower before you do the first dressing change of the day. If your health care  provider does not want your wound to get wet and your dressing is not waterproof, you may need to apply plastic leak-proof sealing wrap over your dressing for protection.  If needed, take pain medicine 30 minutes before the dressing change as told by your health care provider. Removing your old dressing   Wash your hands with soap and water. Dry your hands with a clean towel. If soap and water are not available, use hand sanitizer.  Go to the clean area that you have set up with all the supplies you need.  If you are using gloves, put the gloves on before you remove the dressing.  Gently remove any adhesive or tape by pulling it off in the direction of your hair growth. Only touch the outside edges of the dressing. ? If you are told to use an adhesive remover to loosen the edges of the dressing, make sure to avoid the wound area.  Take off the dressing. If the dressing sticks to your skin, use the recommended cleaning solution to wet the dressing. This helps it come off more easily.  Remove any gauze or packing in your wound.  Throw the old dressing supplies into the garbage bag.  Remove each glove by grabbing the cuff with the opposite hand and turning the glove inside out. Place the gloves in the trash immediately.  Wash your hands with soap and water. Dry your hands with a clean towel. If soap and water are not available, use hand sanitizer. Cleaning your wound  Follow instructions from your health care provider about how to clean your wound. This may include using the recommended cleaning solution. ? You may need to use sterile water to clean your wound if you are applying a dressing that has silver in it.  Do not use over-the-counter medicated or antiseptic creams, sprays, liquids, or dressings unless told to do so by your health care provider.  Use a clean gauze pad to clean the area thoroughly with the recommended cleaning solution.  Throw the gauze pad into the garbage  bag.  Wash your hands with soap and water. Dry your hands with a clean towel. If soap and water are not available, use hand sanitizer. Applying the wound dressing  If your health care provider recommended a skin protectant, apply it to the skin around the wound.  Gently pack, if needed, and cover the wound with  the recommended dressing. Make sure to touch only the outside edges of the dressing. Do not touch the inside of the dressing.  Secure the dressing so all sides stay in place. You may do this with medical adhesive, roll gauze, or tape. If you use tape, do not wrap the tape all the way around your arm or leg.  Take off your gloves. Put them into the garbage bag with the old dressing. Tie the bag shut and throw it away.  Wash your hands with soap and water. Dry your hands with a clean towel. If soap and water are not available, use hand sanitizer. Follow these instructions at home: Wound care   Check your wound every day for signs of infection, or as often as told by your health care provider. Check for: ? More redness, swelling, or pain. ? More fluid or blood. ? Warmth. ? Pus or a bad smell. General instructions  Take over-the-counter and prescription medicines only as told by your health care provider.  Ask your health care provider if the medicine prescribed to you: ? Requires you to avoid driving or using heavy machinery. ? Can cause constipation. You may need to take actions to prevent or treat constipation, such as:  Drink enough fluid to keep your urine pale yellow.  Take over-the-counter or prescription medicines.  Eat foods that are high in fiber, such as beans, whole grains, and fresh fruits and vegetables.  Limit foods that are high in fat and processed sugars, such as fried or sweet foods.  Keep all follow-up visits as told by your health care provider. This is important. Contact a health care provider if:  You have new pain.  You develop irritation, a  rash, or itching around the wound or dressing.  Changing your dressing causes pain or a lot of bleeding. Get help right away if you have:  Severe pain.  Signs of infection, such as: ? More redness, swelling, or pain. ? More fluid or blood. ? Warmth. ? Pus or a bad smell. ? Red streaks leading from the wound. ? A fever. Summary  A dressing is a material that is placed in and over a wound. A dressing helps your wound heal.  Wash your hands before and after each dressing change. If soap and water are not available, use hand sanitizer.  Follow your health care provider's instructions about what dressing supplies to use and how to change your wound dressing.  Check your wound every day for signs of infection, or as often as told by your health care provider.  Get help right away if you have severe pain or signs of infection. This information is not intended to replace advice given to you by your health care provider. Make sure you discuss any questions you have with your health care provider. Document Released: 02/24/2004 Document Revised: 09/13/2017 Document Reviewed: 09/13/2017 Elsevier Patient Education  2020 [employer].

## 2018-11-24 NOTE — Discharge Summary (Signed)
Physician Discharge Summary  Beverly Tucker [id number] DOB: Dec 29, 1940 DOA: 11/16/2018  PCP: Beverly Sacramento, MD  Admit date: 11/16/2018 Discharge date: 11/24/2018  Admitted From: Home Disposition:  Home with home health. Insurance denied SNF claim.   Recommendations for Outpatient Follow-up:  1. Follow up with PCP in 1 week 2. Follow up with cardiology Dr. Meda Tucker in 1-2 weeks  3. Follow-up with oncology Dr. Irene Tucker as scheduled 11/3 4. Follow-up with outpatient BMP in 1 week to check kidney function 5. Continue to drain from Pleurx every other day.  Next due 10/24.  Home Health: PT OT RN Aide   Discharge Condition: Stable CODE STATUS: Full  Diet recommendation: Heart healthy   Brief/Interim Summary: Beverly Cardell Duncanis a 78 year old female with past medical history of hypertension, GERD, anxiety, metastatic right breast cancer recently started on abemaciclib on 10/12 and on letrozole (follows with Dr. Roney Tucker appointment 10/5), recurrent malignant pleural effusion with Pleurx, chronic diastolic heart failure, atrial fibrillation on Eliquis, SVT, pneumothorax, CKD 3 who presented to the ED on 10/17 with 1 week of worsening shortness of breath, leg edema and palpitations. In the ED, patient was found to have SVT with heart rate up to 180s and was given 6 mg of adenosine with conversion to NSR. She was found to have presentation troponin 32, WBC 13.7, BNP 183, lactic acid 1.8, Covid negative. Chest x-ray showed small bilateral pleural effusions and somewhat new hazy opacity and started on empiric azithromycin. She was admitted for acute on chronic diastolic heart failure exacerbation received IV diuresis. Overnight 10/17-10/18 patient had a recurrence of SVT which continuously converted to NSR without any medical intervention. Recurrent SVT episode 10/21-10/22 and required adenosine again and metoprolol dose further adjusted. Patient has remained in NSR since then. Due to significant nausea,  vomiting, and diarrhea, her chemotherapy medications were held. She improved and was stable for discharge home.   Discharge Diagnoses:  Principal Problem:   SVT (supraventricular tachycardia) (HCC) Active Problems:   CKD (chronic kidney disease) stage 3, GFR 30-59 ml/min   HTN (hypertension)   Metastatic breast cancer (HCC)   Pleural effusion   Acute on chronic diastolic CHF (congestive heart failure) (HCC)   Elevated troponin   Leukocytosis   Atrial fibrillation, chronic (HCC)   Malnutrition of moderate degree   Acute hypoxic respiratory failure secondary to decompensated heart failure -Continue to wean nasal cannula O2 to room air. Home desat screen for O2 needs  -Continuelasix  SVT -Status post adenosine 6 mg x 1 in ED with conversion to NSR and again with brief recurrence of SVT which spontaneously converted to NSR 10/19. Again with recurrence of hemodynamically stable SVT 10/20 during BM prior to receiving her Lopressor. Recurrent SVT episode 10/21-10/22 and required adenosine again. -Continue metoprolol, dose increased   -Now in NSR   Paroxysmal atrial fibrillation with RVR -CHA2DS2-VASc: 5. On Eliquis.  -Continue metoprolol   Acute on chronic diastolic heart failure in setting of Afib with RVR -Last echo (10/15/18) EF 65 -70%, EF 55-60% this admission -Continue Lasix, beta-blocker -Appreciate cardiology  Elevated troponin -Troponin 32 which peaked at 49. Suspect demand ischemia in setting of heart failure and SVT.   AKI on CKD 3a -Cr 1.19->1.39->1.59->1.38->1.60->1.56, suspect cardio renal   Hypertension -Stable  Metastatic breast cancer -Follows with Dr. Irene Tucker, Oncology. On letrozole and abemaciclib (recently started abemaciclib 10/12).Has outpatient follow-up 11/3. Will be started on Zometa outpatient for bone mets  Recurrent malignant pleural effusion -Pleurx drain placed 10/9.Drain every other  day  Leukocytosis -Resolved. Afebrile.  Azythromycinadded onadmissionfor empiric coveragewhich was switched to doxycycline 10/19. Shortness of breath and cough is likely due to acute on chronic heart failure, although she does have a persistent infiltrate on xray and not feeling well.  -Currently onomnicefand doxycycline 1 more day to complete total 5 day course   Blood culture contamination -1 out of 4 bottles with micrococcus species reported.  This likely represents blood culture contamination rather than true infection  Nausea, vomiting, diarrhea -KUB negative -Held off on verzenio and femara starting 10/24. Patient with symptomatic improvement.  -Patient encouraged to speak with Dr. Irene Tucker or his RN regarding change in medications or recommendations     Discharge Instructions  Discharge Instructions    (HEART FAILURE PATIENTS) Call MD:  Anytime you have any of the following symptoms: 1) 3 pound weight gain in 24 hours or 5 pounds in 1 week 2) shortness of breath, with or without a dry hacking cough 3) swelling in the hands, feet or stomach 4) if you have to sleep on extra pillows at night in order to breathe.   Complete by: As directed    Call MD for:  difficulty breathing, headache or visual disturbances   Complete by: As directed    Call MD for:  extreme fatigue   Complete by: As directed    Call MD for:  persistant dizziness or light-headedness   Complete by: As directed    Call MD for:  persistant nausea and vomiting   Complete by: As directed    Call MD for:  severe uncontrolled pain   Complete by: As directed    Call MD for:  temperature >100.4   Complete by: As directed    Diet - low sodium heart healthy   Complete by: As directed    Discharge instructions   Complete by: As directed    You were cared for by a hospitalist during your hospital stay. If you have any questions about your discharge medications or the care you received while you were in the hospital after you are discharged, you can call the  unit and ask to speak with the hospitalist on call if the hospitalist that took care of you is not available. Once you are discharged, your primary care physician will handle any further medical issues. Please note that NO REFILLS for any discharge medications will be authorized once you are discharged, as it is imperative that you return to your primary care physician (or establish a relationship with a primary care physician if you do not have one) for your aftercare needs so that they can reassess your need for medications and monitor your lab values.   Increase activity slowly   Complete by: As directed      Allergies as of 11/24/2018   No Known Allergies     Medication List    STOP taking these medications   abemaciclib 50 MG tablet Commonly known as: Verzenio   letrozole 2.5 MG tablet Commonly known as: FEMARA   oxyCODONE-acetaminophen 7.5-325 MG tablet Commonly known as: PERCOCET     TAKE these medications   ALPRAZolam 0.5 MG tablet Commonly known as: XANAX Take 0.25-0.5 mg by mouth 2 (two) times daily as needed for anxiety.   apixaban 5 MG Tabs tablet Commonly known as: ELIQUIS Take 1 tablet (5 mg total) by mouth 2 (two) times daily.   ergocalciferol 1.25 MG (50000 UT) capsule Commonly known as: VITAMIN D2 Take 1 capsule (50,000 Units total) by  mouth once a week. What changed: when to take this   furosemide 40 MG tablet Commonly known as: LASIX Take 1 tablet (40 mg total) by mouth daily.   loperamide 2 MG capsule Commonly known as: IMODIUM Take 1 capsule (2 mg total) by mouth as needed for diarrhea or loose stools.   Metoprolol Tartrate 75 MG Tabs Take 75 mg by mouth 2 (two) times daily. What changed:   medication strength  how much to take   ondansetron 4 MG disintegrating tablet Commonly known as: Zofran ODT Take 1 tablet (4 mg total) by mouth every 8 (eight) hours as needed for nausea or vomiting.            Durable Medical Equipment  (From  admission, onward)         Start     Ordered   11/21/18 1649  For home use only DME Walker  Once    Question:  Patient needs a walker to treat with the following condition  Answer:  Weakness   11/21/18 1648         Follow-up Information    Beverly Sacramento, MD. Schedule an appointment as soon as possible for a visit in 1 week(s).   Specialty: Family Medicine Contact information: [address] [place] [address]        Dorothy Spark, MD. Schedule an appointment as soon as possible for a visit in 1 week(s).   Specialty: Cardiology Contact information: [address] [place] [address] [place] [address] [phone]        Brunetta Genera, MD. Go on 12/03/2018.   Specialties: Hematology, Oncology Contact information: [place] [address] [place], [facility] Follow up.   Specialty: Home Health Services Why: RN, PT,OT, adie Contact information: [place] [address] [phone]        [facility], [facility] Follow up.   Why: rolling walker will brought to patient's room Contact information: [place] [address] [phone]          No Known Allergies  Consultations:  Cardiology  Oncology    Procedures/Studies: Dg Chest 1 View  Result Date: 11/06/2018 CLINICAL DATA:  Post left-sided thoracentesis. EXAM: CHEST  1 VIEW COMPARISON:  Radiographs 11/05/2018 and 10/22/2018. FINDINGS: 1344 hours. The left pleural effusion has decreased in volume. There is improved aeration of the left lung base. There is a stable small right pleural effusion and mild right basilar atelectasis. The heart size and mediastinal contours are stable with aortic atherosclerosis. No pneumothorax. IMPRESSION: 1. Decreased left pleural effusion and improved aeration of the left lung base status post thoracentesis. No pneumothorax. 2. Stable small right pleural effusion and  right basilar atelectasis. Electronically Signed   By: Richardean Sale M.D.   On: 11/06/2018 14:16   Dg Chest 2 View  Result Date: 11/08/2018 CLINICAL DATA:  Left pleural effusion. Insertion of PleurX catheter this morning. Coughing after the procedure. EXAM: CHEST - 2 VIEW COMPARISON:  Radiographs dated 11/07/2018 and 11/06/2018 FINDINGS: There is a tiny left apical pneumothorax. PleurX catheter in place. Minimal residual pleural effusion. There is a small right pleural effusion. Chronic cardiomegaly. Pulmonary vascularity is within normal limits. No acute bone abnormality. Chronic accentuation of the thoracic kyphosis. IMPRESSION: 1. Tiny left apical pneumothorax. PleurX catheter in place. 2. Small right pleural effusion.  Minimal residual left effusion. Electronically Signed  By: Lorriane Shire M.D.   On: 11/08/2018 12:23   Dg Chest 2 View  Result Date: 11/07/2018 CLINICAL DATA:  Left thoracentesis yesterday EXAM: CHEST - 2 VIEW COMPARISON:  11/06/2018 FINDINGS: Moderate left pleural effusion appears slightly larger. No pneumothorax. Consolidation left lower lobe shows progression Small right effusion and mild right lower lobe airspace disease unchanged. Negative for heart failure or edema. IMPRESSION: Progression of left effusion and left lower lobe consolidation following left thoracentesis. No pneumothorax. Electronically Signed   By: Franchot Gallo M.D.   On: 11/07/2018 09:53   Dg Chest 2 View  Result Date: 11/05/2018 CLINICAL DATA:  Shortness of breath EXAM: CHEST - 2 VIEW COMPARISON:  October 22, 2018 FINDINGS: There is mild cardiomegaly. A moderate to large left pleural effusion is seen. There is trace right pleural effusion. Mildly increased interstitial markings seen predominantly at the right lung base, likely chronic lung changes. Air not calcifications. IMPRESSION: Moderate to large left and trace right pleural effusion. Electronically Signed   By: Prudencio Pair M.D.   On: 11/05/2018  22:09   Dg Abd 1 View  Result Date: 11/23/2018 CLINICAL DATA:  Nausea, vomiting, and diarrhea. EXAM: ABDOMEN - 1 VIEW COMPARISON:  CT abdomen pelvis dated October 21, 2018. Abdominal x-ray dated October 16, 2018. FINDINGS: The bowel gas pattern is normal. No radio-opaque calculi or other significant radiographic abnormality are seen. Prior cholecystectomy. No acute osseous abnormality. Left-sided pleural effusion with left chest tube in place. IMPRESSION: No acute findings. Electronically Signed   By: Titus Dubin M.D.   On: 11/23/2018 14:07   Dg Chest Port 1 View  Result Date: 11/19/2018 CLINICAL DATA:  Cough EXAM: PORTABLE CHEST 1 VIEW COMPARISON:  Radiograph 11/18/2018, CTA 10/16/2018 FINDINGS: Stable position of a left PleurX catheter. No visible residual pneumothorax. Small right and moderate left pleural effusions are similar to comparison exam. Central venous congestion and cardiomegaly are unchanged from prior. Atherosclerotic calcification of the aortic arch is again noted. Some bandlike areas of opacity in the lung bases likely reflect atelectasis. Additional airspace opacities in the lung bases could reflect atelectasis or edema. No acute osseous or soft tissue abnormality. Degenerative changes are present in the imaged spine and shoulders. IMPRESSION: 1. Stable left PleurX catheter. No visible residual pneumothorax. 2. Unchanged small right and moderate left pleural effusions. 3. Unchanged cardiomegaly and central venous congestion. Bibasilar airspace opacities could reflect atelectasis or edema. Electronically Signed   By: Lovena Le M.D.   On: 11/19/2018 20:01   Dg Chest Port 1 View  Result Date: 11/18/2018 CLINICAL DATA:  Shortness of breath.  Weakness.  Pleural effusion. EXAM: PORTABLE CHEST 1 VIEW COMPARISON:  11/17/2018. FINDINGS: Left PleurX catheter in stable position. Cardiomegaly with pulmonary venous congestion again noted without interim change. Low lung volumes.  Bibasilar infiltrates/edema again noted. Small unchanged bilateral pleural effusions again noted. No pneumothorax. IMPRESSION: 1.  Left PleurX catheter in stable position.  No pneumothorax. 2. Cardiomegaly with pulmonary venous congestion again noted without interim change. 3. Low lung volumes. Bibasilar and infiltrates/edema again noted. Small unchanged bilateral pleural effusions again noted. Electronically Signed   By: Marcello Moores  Register   On: 11/18/2018 06:44   Dg Chest Port 1 View  Result Date: 11/17/2018 CLINICAL DATA:  Shortness of breath. EXAM: PORTABLE CHEST 1 VIEW COMPARISON:  Radiograph yesterday. CT 10/16/2018 FINDINGS: Left PleurX catheter remains in place. Low lung volumes with bibasilar volume loss. Hazy bibasilar opacities consistent with pleural effusions adjacent atelectasis/consolidation. Cardiomegaly, not well assessed  due to adjacent airspace disease. Worsening vascular congestion. No visualized pneumothorax. IMPRESSION: 1. Worsening vascular congestion. 2. Bibasilar opacities consistent with pleural effusions and adjacent atelectasis/consolidation. 3. Left PleurX catheter remains in place. No visualized pneumothorax. 4. Cardiomegaly. Electronically Signed   By: Keith Rake M.D.   On: 11/17/2018 02:19   Dg Chest Port 1 View  Result Date: 11/16/2018 CLINICAL DATA:  Shortness of breath EXAM: PORTABLE CHEST 1 VIEW COMPARISON:  November 09, 2018 FINDINGS: There is mild cardiomegaly. Again noted is a left-sided pleural catheter. Aortic knob calcifications. There is a small left pleural effusion with patchy airspace opacity at the left lung base which is new. There is a trace right pleural effusion with hazy airspace opacity at the right lung base. IMPRESSION: Small bilateral pleural effusions, left greater than right with new hazy airspace opacities at both lung bases which could be due to infectious etiology, layering effusion, and/or edema. Cardiomegaly Left-sided pleural catheter.  Electronically Signed   By: Prudencio Pair M.D.   On: 11/16/2018 19:15   Dg Chest Port 1 View  Result Date: 11/09/2018 CLINICAL DATA:  Short of breath and weakness. EXAM: PORTABLE CHEST 1 VIEW COMPARISON:  1 day prior FINDINGS: Left-sided PleurX catheter remains in place. The Chin overlies the apices. Moderate cardiomegaly. Increase in small left pleural effusion. Given limitations of chin overlying the left apex, no residual pneumothorax is identified. Similar right and worsened left base airspace disease. Suspect developing mild interstitial edema. IMPRESSION: Worsened aeration, with probable developing interstitial edema and progressive bibasilar airspace disease. Left pleural drain in place, without convincing evidence of residual pneumothorax. Electronically Signed   By: Abigail Miyamoto M.D.   On: 11/09/2018 10:53   Ir Perc Pleural Drain W/indwell Cath W/img Guide  Result Date: 11/08/2018 INDICATION: 78 year old female with stage IV breast cancer and a recurrent symptomatic left-sided malignant pleural effusion. She presents for placement of a tunneled pleural drainage catheter. EXAM: Placement of a tunneled pleural drainage catheter MEDICATIONS: 2 g Ancef ANESTHESIA/SEDATION: Fentanyl 50 mcg IV; Versed 1.5 mg IV Moderate Sedation Time:  20 minutes The patient was continuously monitored during the procedure by the interventional radiology nurse under my direct supervision. COMPLICATIONS: None immediate. PROCEDURE: Informed written consent was obtained from the patient after a thorough discussion of the procedural risks, benefits and alternatives. All questions were addressed. Maximal Sterile Barrier Technique was utilized including caps, mask, sterile gowns, sterile gloves, sterile drape, hand hygiene and skin antiseptic. A timeout was performed prior to the initiation of the procedure. Ultrasound was used to interrogate the left chest. A large pleural effusion is present. A suitable skin entry site was  selected and marked. Local anesthesia was attained by infiltration with 1% lidocaine. A small dermatotomy was made. An 18 gauge sheath needle was then advanced over a rib and into the pleural space. The sheath was left in place while the 18 gauge needle was removed. A short Amplatz wire was advanced through the sheath needle and positioned in the pleural space. A suitable skin exit site 5 cm anterior and medial to the pleural entry site was identified. Local anesthesia was again attained by infiltration with 1% lidocaine. A second small dermatotomy was made. The pleural drainage catheter was then tunneled from the exit site to the dermatotomy overlying the pleural entry site. The sheath needle was then removed over the wire. A peel-away sheath was advanced over the wire and into the pleural space. The tunneled drainage catheter was then advanced through the peel-away sheath  and the peel-away sheath was discarded. The catheter was connected to a vacuum bottle and approximately 700 mL of pleural fluid was evacuated. Further aspiration was stopped due to symptomatic coughing. The catheter was secured to the skin with 0 Prolene suture. The dermatotomy overlying the pleural entry site was closed with Dermabond. Sterile bandages were placed. IMPRESSION: Technically successful placement of a left tunneled pleural drainage catheter. Signed, Criselda Peaches, MD, [facility] Radiology Electronically Signed   By: Jacqulynn Cadet M.D.   On: 11/08/2018 10:05   US Thoracentesis Asp Pleural Space W/img Guide  Result Date: 11/06/2018 INDICATION: Patient with history of CHF, chronic kidney disease, dyspnea, metastatic breast cancer, recurrent left pleural effusion. Request made for therapeutic left thoracentesis. EXAM: ULTRASOUND GUIDED THERAPEUTIC LEFT THORACENTESIS MEDICATIONS: None COMPLICATIONS: None immediate. PROCEDURE: An ultrasound guided thoracentesis was  thoroughly discussed with the patient and questions answered. The benefits, risks, alternatives and complications were also discussed. The patient understands and wishes to proceed with the procedure. Written consent was obtained. Ultrasound was performed to localize and mark an adequate pocket of fluid in the left chest. The area was then prepped and draped in the normal sterile fashion. 1% Lidocaine was used for local anesthesia. Under ultrasound guidance a 6 Fr Safe-T-Centesis catheter was introduced. Thoracentesis was performed. The catheter was removed and a dressing applied. FINDINGS: A total of approximately 900 cc of amber/blood-tinged fluid was removed. Due to persistent patient coughing only the above amount of fluid was removed today. IMPRESSION: Successful ultrasound guided therapeutic left thoracentesis yielding 900 cc of pleural fluid. Read by: Rowe Robert, PA-C Electronically Signed   By: Jacqulynn Cadet M.D.   On: 11/06/2018 14:11       Discharge Exam: Vitals:   11/24/18 0327 11/24/18 0743  BP: 122/60   Pulse: 60 77  Resp: (!) 27 20  Temp: 98.4 F (36.9 C)   SpO2: 99% 91%    General: Pt is alert, awake, not in acute distress Cardiovascular: RRR, S1/S2 +, no edema Respiratory: CTA bilaterally, no wheezing, no rhonchi, no respiratory distress, no conversational dyspnea  Abdominal: Soft, NT, ND, bowel sounds + Extremities: no edema, no cyanosis Psych: Normal mood and affect, stable judgement and insight     The results of significant diagnostics from this hospitalization (including imaging, microbiology, ancillary and laboratory) are listed below for reference.     Microbiology: Recent Results (from the past 240 hour(s))  SARS Coronavirus 2 by RT PCR (hospital order, performed in [facility] hospital lab) Nasopharyngeal Nasopharyngeal Swab     Status: None   Collection Time: 11/16/18  7:37 PM   Specimen: Nasopharyngeal Swab  Result Value Ref Range Status   SARS  Coronavirus 2 NEGATIVE NEGATIVE Final    Comment: (NOTE) If result is NEGATIVE SARS-CoV-2 target nucleic acids are NOT DETECTED. The SARS-CoV-2 RNA is generally detectable in upper and lower  respiratory specimens during the acute phase of infection. The lowest  concentration of SARS-CoV-2 viral copies this assay can detect is 250  copies / mL. A negative result does not preclude SARS-CoV-2 infection  and should not be used as the sole basis for treatment or other  patient management decisions.  A negative result may occur with  improper specimen collection / handling, submission of specimen other  than nasopharyngeal swab, presence of viral mutation(s) within the  areas targeted by this assay, and inadequate number of viral copies  (<250 copies / mL). A negative result must  be combined with clinical  observations, patient history, and epidemiological information. If result is POSITIVE SARS-CoV-2 target nucleic acids are DETECTED. The SARS-CoV-2 RNA is generally detectable in upper and lower  respiratory specimens dur ing the acute phase of infection.  Positive  results are indicative of active infection with SARS-CoV-2.  Clinical  correlation with patient history and other diagnostic information is  necessary to determine patient infection status.  Positive results do  not rule out bacterial infection or co-infection with other viruses. If result is PRESUMPTIVE POSTIVE SARS-CoV-2 nucleic acids MAY BE PRESENT.   A presumptive positive result was obtained on the submitted specimen  and confirmed on repeat testing.  While 2019 novel coronavirus  (SARS-CoV-2) nucleic acids may be present in the submitted sample  additional confirmatory testing may be necessary for epidemiological  and / or clinical management purposes  to differentiate between  SARS-CoV-2 and other Sarbecovirus currently known to infect humans.  If clinically indicated additional testing with an alternate test   methodology [phone]) is advised. The SARS-CoV-2 RNA is generally  detectable in upper and lower respiratory sp ecimens during the acute  phase of infection. The expected result is Negative. Fact Sheet for Patients:  StrictlyIdeas.no Fact Sheet for Healthcare Providers: BankingDealers.co.za This test is not yet approved or cleared by the [place] FDA and has been authorized for detection and/or diagnosis of SARS-CoV-2 by FDA under an Emergency Use Authorization (EUA).  This EUA will remain in effect (meaning this test can be used) for the duration of the COVID-19 declaration under Section 564(b)(1) of the Act, 21 U.S.C. section 360bbb-3(b)(1), unless the authorization is terminated or revoked sooner. Performed at [facility], [place] [address], [place], [place] [address]   MRSA PCR Screening     Status: None   Collection Time: 11/17/18 12:15 AM   Specimen: Nasal Mucosa; Nasopharyngeal  Result Value Ref Range Status   MRSA by PCR NEGATIVE NEGATIVE Final    Comment:        The GeneXpert MRSA Assay (FDA approved for NASAL specimens only), is one component of a comprehensive MRSA colonization surveillance program. It is not intended to diagnose MRSA infection nor to guide or monitor treatment for MRSA infections. Performed at [facility], [place] [address] [place], [place] [address]   Culture, blood (Routine X 2) w Reflex to ID Panel     Status: Abnormal   Collection Time: 11/17/18 12:31 AM   Specimen: BLOOD  Result Value Ref Range Status   Specimen Description BLOOD LEFT ANTECUBITAL  Final   Special Requests   Final    BOTTLES DRAWN AEROBIC AND ANAEROBIC Blood Culture adequate volume   Culture  Setup Time   Final    GRAM POSITIVE COCCI AEROBIC BOTTLE ONLY Gram Stain Report Called to,Read Back By and Verified WithTillman Sers PHARMD [id number] 11/21/18 A BROWNING    Culture (A)  Final    MICROCOCCUS SPECIES CORRECTED ON  10/23 AT 0751: PREVIOUSLY REPORTED AS NO GROWTH 5 DAYS Standardized susceptibility testing for this organism is not available. Performed at [facility], [place] [address] [place], [place] [address]    Report Status 11/22/2018 FINAL  Final  Culture, blood (Routine X 2) w Reflex to ID Panel     Status: None   Collection Time: 11/17/18 12:31 AM   Specimen: BLOOD  Result Value Ref Range Status   Specimen Description BLOOD LEFT ARM  Final   Special Requests   Final  BOTTLES DRAWN AEROBIC AND ANAEROBIC Blood Culture results may not be optimal due to an inadequate volume of blood received in culture bottles   Culture   Final    NO GROWTH 5 DAYS Performed at [facility], [place] [address], [place], [place] [address]    Report Status 11/22/2018 FINAL  Final     Labs: BNP (last 3 results) Recent Labs    10/14/18 1440 11/05/18 2353 11/16/18 1840  BNP 458.5* 47.6 0000000*   Basic Metabolic Panel: Recent Labs  Lab 11/18/18 0322 11/18/18 1426 11/19/18 0227 11/20/18 0251 11/21/18 0236 11/22/18 0240 11/23/18 0252 11/24/18 0306  NA 141 135 139 138 137 137 138 138  K 4.1 4.0 3.4* 3.9 4.2 3.9 4.0 3.6  CL 95* 89* 90* 92* 91* 90* 89* 90*  CO2 36* 32 36* 36* 33* 34* 36* 37*  GLUCOSE 121* 162* 120* 109* 117* 103* 111* 102*  BUN 15 15 18 22  25* 27* 29* 29*  CREATININE 1.39* 1.48* 1.59* 1.38* 1.60* 1.56* 1.60* 1.46*  CALCIUM 8.5* 9.0 8.6* 8.7* 9.0 8.8* 9.1 8.8*  MG 1.9 1.8 1.8  --   --   --   --   --    Liver Function Tests: Recent Labs  Lab 11/23/18 1148  AST 21  ALT 16  ALKPHOS 102  BILITOT 0.7  PROT 6.4*  ALBUMIN 2.8*   No results for input(s): LIPASE, AMYLASE in the last 168 hours. No results for input(s): AMMONIA in the last 168 hours. CBC: Recent Labs  Lab 11/18/18 0322 11/19/18 0227 11/22/18 0240  WBC 9.7 8.5 9.9  HGB 10.2* 10.0* 10.0*  HCT 33.0* 32.2* 32.6*  MCV 93.0 92.5 92.6  PLT 374 392 406*   Cardiac Enzymes: No results for input(s): CKTOTAL,  CKMB, CKMBINDEX, TROPONINI in the last 168 hours. BNP: Invalid input(s): POCBNP CBG: No results for input(s): GLUCAP in the last 168 hours. D-Dimer No results for input(s): DDIMER in the last 72 hours. Hgb A1c No results for input(s): HGBA1C in the last 72 hours. Lipid Profile No results for input(s): CHOL, HDL, LDLCALC, TRIG, CHOLHDL, LDLDIRECT in the last 72 hours. Thyroid function studies No results for input(s): TSH, T4TOTAL, T3FREE, THYROIDAB in the last 72 hours.  Invalid input(s): FREET3 Anemia work up No results for input(s): VITAMINB12, FOLATE, FERRITIN, TIBC, IRON, RETICCTPCT in the last 72 hours. Urinalysis    Component Value Date/Time   COLORURINE YELLOW 11/07/2018 0017   APPEARANCEUR CLOUDY (A) 11/07/2018 0017   LABSPEC 1.013 11/07/2018 0017   PHURINE 6.0 11/07/2018 0017   GLUCOSEU NEGATIVE 11/07/2018 0017   HGBUR MODERATE (A) 11/07/2018 0017   BILIRUBINUR NEGATIVE 11/07/2018 0017   KETONESUR NEGATIVE 11/07/2018 0017   PROTEINUR NEGATIVE 11/07/2018 0017   NITRITE POSITIVE (A) 11/07/2018 0017   LEUKOCYTESUR LARGE (A) 11/07/2018 0017   Sepsis Labs Invalid input(s): PROCALCITONIN,  WBC,  LACTICIDVEN Microbiology Recent Results (from the past 240 hour(s))  SARS Coronavirus 2 by RT PCR (hospital order, performed in [place] hospital lab) Nasopharyngeal Nasopharyngeal Swab     Status: None   Collection Time: 11/16/18  7:37 PM   Specimen: Nasopharyngeal Swab  Result Value Ref Range Status   SARS Coronavirus 2 NEGATIVE NEGATIVE Final    Comment: (NOTE) If result is NEGATIVE SARS-CoV-2 target nucleic acids are NOT DETECTED. The SARS-CoV-2 RNA is generally detectable in upper and lower  respiratory specimens during the acute phase of infection. The lowest  concentration of SARS-CoV-2 viral copies this assay can  detect is 250  copies / mL. A negative result does not preclude SARS-CoV-2 infection  and should not be used as the sole basis for treatment or other   patient management decisions.  A negative result may occur with  improper specimen collection / handling, submission of specimen other  than nasopharyngeal swab, presence of viral mutation(s) within the  areas targeted by this assay, and inadequate number of viral copies  (<250 copies / mL). A negative result must be combined with clinical  observations, patient history, and epidemiological information. If result is POSITIVE SARS-CoV-2 target nucleic acids are DETECTED. The SARS-CoV-2 RNA is generally detectable in upper and lower  respiratory specimens dur ing the acute phase of infection.  Positive  results are indicative of active infection with SARS-CoV-2.  Clinical  correlation with patient history and other diagnostic information is  necessary to determine patient infection status.  Positive results do  not rule out bacterial infection or co-infection with other viruses. If result is PRESUMPTIVE POSTIVE SARS-CoV-2 nucleic acids MAY BE PRESENT.   A presumptive positive result was obtained on the submitted specimen  and confirmed on repeat testing.  While 2019 novel coronavirus  (SARS-CoV-2) nucleic acids may be present in the submitted sample  additional confirmatory testing may be necessary for epidemiological  and / or clinical management purposes  to differentiate between  SARS-CoV-2 and other Sarbecovirus currently known to infect humans.  If clinically indicated additional testing with an alternate test  methodology [phone]) is advised. The SARS-CoV-2 RNA is generally  detectable in upper and lower respiratory sp ecimens during the acute  phase of infection. The expected result is Negative. Fact Sheet for Patients:  StrictlyIdeas.no Fact Sheet for Healthcare Providers: BankingDealers.co.za This test is not yet approved or cleared by the [place] FDA and has been authorized for detection and/or diagnosis of SARS-CoV-2  by FDA under an Emergency Use Authorization (EUA).  This EUA will remain in effect (meaning this test can be used) for the duration of the COVID-19 declaration under Section 564(b)(1) of the Act, 21 U.S.C. section 360bbb-3(b)(1), unless the authorization is terminated or revoked sooner. Performed at [facility], [place] [address], [place], [place] [address]   MRSA PCR Screening     Status: None   Collection Time: 11/17/18 12:15 AM   Specimen: Nasal Mucosa; Nasopharyngeal  Result Value Ref Range Status   MRSA by PCR NEGATIVE NEGATIVE Final    Comment:        The GeneXpert MRSA Assay (FDA approved for NASAL specimens only), is one component of a comprehensive MRSA colonization surveillance program. It is not intended to diagnose MRSA infection nor to guide or monitor treatment for MRSA infections. Performed at [facility], [place] [address] [place], [place] [address]   Culture, blood (Routine X 2) w Reflex to ID Panel     Status: Abnormal   Collection Time: 11/17/18 12:31 AM   Specimen: BLOOD  Result Value Ref Range Status   Specimen Description BLOOD LEFT ANTECUBITAL  Final   Special Requests   Final    BOTTLES DRAWN AEROBIC AND ANAEROBIC Blood Culture adequate volume   Culture  Setup Time   Final    GRAM POSITIVE COCCI AEROBIC BOTTLE ONLY Gram Stain Report Called to,Read Back By and Verified WithTillman Sers PHARMD [id number] 11/21/18 A BROWNING    Culture (A)  Final    MICROCOCCUS SPECIES CORRECTED ON 10/23 AT 0751: PREVIOUSLY REPORTED AS NO GROWTH 5 DAYS  Standardized susceptibility testing for this organism is not available. Performed at [facility], [place] [address], [place], [place] [address]    Report Status 11/22/2018 FINAL  Final  Culture, blood (Routine X 2) w Reflex to ID Panel     Status: None   Collection Time: 11/17/18 12:31 AM   Specimen: BLOOD  Result Value Ref Range Status   Specimen Description BLOOD LEFT ARM  Final   Special Requests   Final     BOTTLES DRAWN AEROBIC AND ANAEROBIC Blood Culture results may not be optimal due to an inadequate volume of blood received in culture bottles   Culture   Final    NO GROWTH 5 DAYS Performed at [facility], [place] [address] [place], [place] [address]    Report Status 11/22/2018 FINAL  Final     Patient was seen and examined on the day of discharge and was found to be in stable condition. Time coordinating discharge: 35 minutes including assessment and coordination of care, as well as examination of the patient.   SIGNED:  Dessa Phi, DO [facility] 11/24/2018, 8:53 AM

## 2018-11-25 ENCOUNTER — Telehealth: Payer: Self-pay | Admitting: Cardiology

## 2018-11-25 ENCOUNTER — Telehealth: Payer: Self-pay | Admitting: *Deleted

## 2018-11-25 NOTE — Telephone Encounter (Signed)
Encounter not needed

## 2018-11-25 NOTE — Telephone Encounter (Signed)
Patients son confirmed appt, no call back needed.

## 2018-11-25 NOTE — Telephone Encounter (Signed)
Son [name] called - mother discharged from hospital Sunday 10/25. Hormone therapy (letrozole)  and chemo pills (verzenio) stopped by hospital MD on Saturday night due to nausea and diarrhea. Was told to hold them on d/c. He wants to know if/when she should start back? He also wanted to know if Dr.Kale could prescribe something to increase his  mother's appetite. Per Dr.Kale: She can restart the hormone now and take anti-nausea medication as needed. He will discuss restarting verzenio at next appt on 11/3. He will also discuss medication to stimulate appetite at that time.  Attempted to contact [place] on named VM with information provided by Dr.Kale. Encouraged to contact office as needed.

## 2018-11-25 NOTE — Telephone Encounter (Signed)
Left the pts (EC) Beverly Tucker a detailed VM to call the office back to arrange post-hospital follow-up appt with APP.  Dr. Meda Coffee has nothing available, so held a slot on [name] NP's schedule for 12/05/18 at 1130.  Left a detailed message on Beverly Tucker's confirmed VM, to call the office back and endorse if this appt date and time is ok.

## 2018-11-25 NOTE — Telephone Encounter (Signed)
New message:    Patient son calling stating that his mother just got out the hospital and states she is to follow up with Dr. Meda Coffee. However Dr. Meda Coffee do not have anything in a week for follow up. Please call patient son.

## 2018-11-29 ENCOUNTER — Telehealth: Payer: Self-pay | Admitting: *Deleted

## 2018-11-29 NOTE — Telephone Encounter (Signed)
Received voice mail from son [name] - mother's nose is bleeding from oxygen and he was told by home health agency that MD needed to order a humidifier for the oxygen. He would appreciate it being ordered soon so he could pick it up.  Davina Poke, RN w/ [facility] [phone] to inquire if [name] needed an order for this. She stated the family needs to call the equipment company who brought the oxygen and they will provide humidifier. She will contact family/Eric with this information and contact Dr.Kale's office if further support is needed.

## 2018-12-02 ENCOUNTER — Encounter: Payer: Medicare HMO | Admitting: Nutrition

## 2018-12-02 ENCOUNTER — Ambulatory Visit: Payer: Medicare HMO

## 2018-12-03 ENCOUNTER — Encounter: Payer: Self-pay | Admitting: Hematology

## 2018-12-03 ENCOUNTER — Inpatient Hospital Stay: Payer: Medicare HMO | Attending: Hematology | Admitting: Nutrition

## 2018-12-03 ENCOUNTER — Telehealth: Payer: Self-pay | Admitting: Hematology

## 2018-12-03 ENCOUNTER — Inpatient Hospital Stay (HOSPITAL_BASED_OUTPATIENT_CLINIC_OR_DEPARTMENT_OTHER): Payer: Medicare HMO | Admitting: Hematology

## 2018-12-03 ENCOUNTER — Other Ambulatory Visit: Payer: Self-pay

## 2018-12-03 ENCOUNTER — Inpatient Hospital Stay: Payer: Medicare HMO

## 2018-12-03 VITALS — BP 141/59 | HR 71 | Temp 98.7°F | Resp 18 | Ht 66.0 in | Wt 176.6 lb

## 2018-12-03 DIAGNOSIS — C50919 Malignant neoplasm of unspecified site of unspecified female breast: Secondary | ICD-10-CM

## 2018-12-03 DIAGNOSIS — C50911 Malignant neoplasm of unspecified site of right female breast: Secondary | ICD-10-CM | POA: Diagnosis present

## 2018-12-03 DIAGNOSIS — C7951 Secondary malignant neoplasm of bone: Secondary | ICD-10-CM | POA: Diagnosis not present

## 2018-12-03 MED ORDER — HYDROXYZINE HCL 10 MG PO TABS: 10.0000 mg | ORAL_TABLET | Freq: Three times a day (TID) | ORAL | 0 refills | Status: DC | PRN

## 2018-12-03 MED ORDER — DENOSUMAB 120 MG/1.7ML ~~LOC~~ SOLN
SUBCUTANEOUS | Status: AC
  Filled 2018-12-03: qty 1.7

## 2018-12-03 MED ORDER — DENOSUMAB 120 MG/1.7ML ~~LOC~~ SOLN
120.0000 mg | Freq: Once | SUBCUTANEOUS | Status: AC
  Administered 2018-12-03: 120 mg via SUBCUTANEOUS

## 2018-12-03 NOTE — Telephone Encounter (Signed)
Oral Oncology Patient Advocate Encounter  I met the patient in the lobby today, she signed the application.  I faxed the completed application today, 11/3.  This encounter will be updated until final determination.  Krista Layton CPHT [facility] Patient Advocate [facility] Phone [phone] Fax [phone] 12/03/2018   4:15 PM  

## 2018-12-03 NOTE — Progress Notes (Signed)
HEMATOLOGY/ONCOLOGY CONSULTATION NOTE  Date of Service: 12/03/2018  Patient Care Team: [name], MD as PCP - General (Family Medicine)  CHIEF COMPLAINTS/PURPOSE OF CONSULTATION:  Breast Cancer  HISTORY OF PRESENTING ILLNESS:   Beverly Tucker is a wonderful 78 y.o. female with past medical history of hypertension who presents with a 3-week history of progressive worsening of shortness of breath.  Patient reports that symptoms started with a runny nose and cough productive of clear white mucus.  Patient saw outpatient provider, was COVID negative, was started on allergy medication.  Shortness of breath continue to progress, saw provider on 9/12, was diagnosed with pneumonia, and started on Keflex.  At that visit, patient was also started on doxycycline for cellulitis (under right breast).  Patient reports taking both medications consistently every day.  Patient reports that shortness of breath is worse with exertion, worse when lying flat, reports sleeping with 2 pillows, denies PND symptoms.  Patient denies fever, chills, muscle aches, weight loss/gain, abdominal pain, urinary symptoms, diarrhea.  Reports 1 episode of nonbilious, nonbloody vomit last Saturday.  Patient reports lower extremity swelling over the past 2 days.  States last colonoscopy was 3 to 4 years ago, not sure when she is supposed to follow-up again.  Patient reports never having a mammogram.  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 Patient reports lesion under right breast which is non-tender and has been developing for the past couple of months. She has been taking doxycycline as this was thought to be cellulitis.  PCP is Kathryne Eriksson with [facility].   INTERVAL HISTORY:   Beverly Tucker is a wonderful 78 y.o. female who is here for evaluation and management of breast cancer. We are joined  by her son. The patient's last visit with [place] was on 11/04/2018. The pt reports that she is doing well overall.  The pt reports that her breathing has been alright since her last hospital visit. She felt that she was having heart palpitations and her heart was beating too rapidly so she went to the ER. Pt did not have any chest pain but was having a hard time breathing. Pt was in the hospital for 8 days and stopped both Letrozole and Verzenio. She began taking Letrozole again about a week ago and denies any issues taking it. Pt was having bothersome diarrhea, about 3 times per day, and nausea after she arrived at the hospital. During this time she was still taking 100 mg of Verzenio. Pt is not currently experiencing a significant amount of chest pain. Her home nurses are assisting her in draining the fluid from her lungs every 2-3 days, yesterday they drained 250 mL. Pt has Imodium and Zofran at home to use as needed. She has had a more robust appetite recently and has been eating better but she has had no desire to eat her favorite foods. She does not recall taking any Xanax in the past week but is interested in something stronger. Pt has been sleeping well. Pt has false teeth and denies any concerns for ulcers underneath her dentures. She continues to take Ergocalciferol as prescribed.   Lab results (11/22/18) of CBC and BMP is as follows: all values are WNL except for RBC at 3.52, Hgb at 10.0, HCT at 32.6, PLTs at 406K, Chloride at 90, CO2 at 34, Glucose at 103, BUN at 27, Creatinine at 1.56,  Calcium at 8.8, GFR Est Non Af Am at 31.  On review of systems, pt reports improved breathing, improved eating and denies body aches/pains, fevers, chills, chest pain, nausea, diarrhea, bowel movement issues, abdominal pain, dental issues/pains and any other symptoms.    MEDICAL HISTORY:  Past Medical History:  Diagnosis Date   Acute CHF (congestive heart failure) ([place]) 10/2018   Atrial fibrillation (HCC)     Cancer (HCC)    Breast    GERD (gastroesophageal reflux disease)    Hypertension     SURGICAL HISTORY: Past Surgical History:  Procedure Laterality Date   CHOLECYSTECTOMY     IR PERC PLEURAL DRAIN W/INDWELL CATH W/IMG GUIDE  11/08/2018   IR THORACENTESIS ASP PLEURAL SPACE W/IMG GUIDE  10/22/2018    SOCIAL HISTORY: Social History   Socioeconomic History   Marital status: Widowed    Spouse name: Not on file   Number of children: Not on file   Years of education: Not on file   Highest education level: Not on file  Occupational History   Not on file  Social Needs   Financial resource strain: Not on file   Food insecurity    Worry: Not on file    Inability: Not on file   Transportation needs    Medical: Not on file    Non-medical: Not on file  Tobacco Use   Smoking status: Never Smoker   Smokeless tobacco: Never Used  Substance and Sexual Activity   Alcohol use: Never    Frequency: Never   Drug use: Never   Sexual activity: Not on file  Lifestyle   Physical activity    Days per week: Not on file    Minutes per session: Not on file   Stress: Not on file  Relationships   Social connections    Talks on phone: Not on file    Gets together: Not on file    Attends religious service: Not on file    Active member of club or organization: Not on file    Attends meetings of clubs or organizations: Not on file    Relationship status: Not on file   Intimate partner violence    Fear of current or ex partner: Not on file    Emotionally abused: Not on file    Physically abused: Not on file    Forced sexual activity: Not on file  Other Topics Concern   Not on file  Social History Narrative   Not on file    FAMILY HISTORY: Family History  Problem Relation Age of Onset   Diabetes Mellitus II Other        son    ALLERGIES:  has No Known Allergies.  MEDICATIONS:  Current Outpatient Medications  Medication Sig Dispense Refill   ALPRAZolam  (XANAX) 0.5 MG tablet Take 0.25-0.5 mg by mouth 2 (two) times daily as needed for anxiety.     apixaban (ELIQUIS) 5 MG TABS tablet Take 1 tablet (5 mg total) by mouth 2 (two) times daily. 60 tablet 1   ergocalciferol (VITAMIN D2) 1.25 MG (50000 UT) capsule Take 1 capsule (50,000 Units total) by mouth once a week. (Patient taking differently: Take 50,000 Units by mouth every Monday. ) 12 capsule 0   furosemide (LASIX) 40 MG tablet Take 1 tablet (40 mg total) by mouth daily. 30 tablet 0   hydrOXYzine (ATARAX/VISTARIL) 10 MG tablet Take 1 tablet (10 mg total) by mouth every 8 (eight) hours as needed for anxiety or  nausea. 30 tablet 0   loperamide (IMODIUM) 2 MG capsule Take 1 capsule (2 mg total) by mouth as needed for diarrhea or loose stools. 30 capsule 0   metoprolol tartrate 75 MG TABS Take 75 mg by mouth 2 (two) times daily. 60 tablet 0   ondansetron (ZOFRAN ODT) 4 MG disintegrating tablet Take 1 tablet (4 mg total) by mouth every 8 (eight) hours as needed for nausea or vomiting. 20 tablet 0   No current facility-administered medications for this visit.     REVIEW OF SYSTEMS:   A 10+ POINT REVIEW OF SYSTEMS WAS OBTAINED including neurology, dermatology, psychiatry, cardiac, respiratory, lymph, extremities, GI, GU, Musculoskeletal, constitutional, breasts, reproductive, HEENT.  All pertinent positives are noted in the HPI.  All others are negative.   PHYSICAL EXAMINATION: ECOG PERFORMANCE STATUS: 3 - Symptomatic, >50% confined to bed  . Vitals:   12/03/18 1444  BP: (!) 141/59  Pulse: 71  Resp: 18  Temp: 98.7 F (37.1 C)  SpO2: 92%   Filed Weights   12/03/18 1444  Weight: 176 lb 9.6 oz (80.1 kg)   .Body mass index is 28.5 kg/m.  Exam was given in a chair   GENERAL:alert, in no acute distress and comfortable SKIN: no acute rashes, no significant lesions EYES: conjunctiva are pink and non-injected, sclera anicteric OROPHARYNX: MMM, no exudates, no oropharyngeal erythema  or ulceration NECK: supple, no JVD LYMPH:  no palpable lymphadenopathy in the cervical, axillary or inguinal regions LUNGS: fluid 1/4th up the chest wall on the left side  HEART: regular rate & rhythm ABDOMEN:  normoactive bowel sounds , non tender, not distended. No palpable hepatosplenomegaly.  Extremity: no pedal edema  PSYCH: alert & oriented x 3 with fluent speech NEURO: no focal motor/sensory deficits  LABORATORY DATA:  I have reviewed the data as listed  . CBC Latest Ref Rng & Units 11/22/2018 11/19/2018 11/18/2018  WBC 4.0 - 10.5 K/uL 9.9 8.5 9.7  Hemoglobin 12.0 - 15.0 g/dL 10.0(L) 10.0(L) 10.2(L)  Hematocrit 36.0 - 46.0 % 32.6(L) 32.2(L) 33.0(L)  Platelets 150 - 400 K/uL 406(H) 392 374    . CMP Latest Ref Rng & Units 11/24/2018 11/23/2018 11/22/2018  Glucose 70 - 99 mg/dL 102(H) 111(H) 103(H)  BUN 8 - 23 mg/dL 29(H) 29(H) 27(H)  Creatinine 0.44 - 1.00 mg/dL 1.46(H) 1.60(H) 1.56(H)  Sodium 135 - 145 mmol/L 138 138 137  Potassium 3.5 - 5.1 mmol/L 3.6 4.0 3.9  Chloride 98 - 111 mmol/L 90(L) 89(L) 90(L)  CO2 22 - 32 mmol/L 37(H) 36(H) 34(H)  Calcium 8.9 - 10.3 mg/dL 8.8(L) 9.1 8.8(L)  Total Protein 6.5 - 8.1 g/dL - 6.4(L) -  Total Bilirubin 0.3 - 1.2 mg/dL - 0.7 -  Alkaline Phos 38 - 126 U/L - 102 -  AST 15 - 41 U/L - 21 -  ALT 0 - 44 U/L - 16 -   10/16/2018 Surgical Pathology    10/16/2018 Surgical Pathology    RADIOGRAPHIC STUDIES: I have personally reviewed the radiological images as listed and agreed with the findings in the report. Dg Chest 1 View  Result Date: 11/06/2018 CLINICAL DATA:  Post left-sided thoracentesis. EXAM: CHEST  1 VIEW COMPARISON:  Radiographs 11/05/2018 and 10/22/2018. FINDINGS: 1344 hours. The left pleural effusion has decreased in volume. There is improved aeration of the left lung base. There is a stable small right pleural effusion and mild right basilar atelectasis. The heart size and mediastinal contours are stable with aortic  atherosclerosis. No pneumothorax.  IMPRESSION: 1. Decreased left pleural effusion and improved aeration of the left lung base status post thoracentesis. No pneumothorax. 2. Stable small right pleural effusion and right basilar atelectasis. Electronically Signed   By: Richardean Sale M.D.   On: 11/06/2018 14:16   Dg Chest 2 View  Result Date: 11/08/2018 CLINICAL DATA:  Left pleural effusion. Insertion of PleurX catheter this morning. Coughing after the procedure. EXAM: CHEST - 2 VIEW COMPARISON:  Radiographs dated 11/07/2018 and 11/06/2018 FINDINGS: There is a tiny left apical pneumothorax. PleurX catheter in place. Minimal residual pleural effusion. There is a small right pleural effusion. Chronic cardiomegaly. Pulmonary vascularity is within normal limits. No acute bone abnormality. Chronic accentuation of the thoracic kyphosis. IMPRESSION: 1. Tiny left apical pneumothorax. PleurX catheter in place. 2. Small right pleural effusion.  Minimal residual left effusion. Electronically Signed   By: Lorriane Shire M.D.   On: 11/08/2018 12:23   Dg Chest 2 View  Result Date: 11/07/2018 CLINICAL DATA:  Left thoracentesis yesterday EXAM: CHEST - 2 VIEW COMPARISON:  11/06/2018 FINDINGS: Moderate left pleural effusion appears slightly larger. No pneumothorax. Consolidation left lower lobe shows progression Small right effusion and mild right lower lobe airspace disease unchanged. Negative for heart failure or edema. IMPRESSION: Progression of left effusion and left lower lobe consolidation following left thoracentesis. No pneumothorax. Electronically Signed   By: Franchot Gallo M.D.   On: 11/07/2018 09:53   Dg Chest 2 View  Result Date: 11/05/2018 CLINICAL DATA:  Shortness of breath EXAM: CHEST - 2 VIEW COMPARISON:  October 22, 2018 FINDINGS: There is mild cardiomegaly. A moderate to large left pleural effusion is seen. There is trace right pleural effusion. Mildly increased interstitial markings seen predominantly  at the right lung base, likely chronic lung changes. Air not calcifications. IMPRESSION: Moderate to large left and trace right pleural effusion. Electronically Signed   By: Prudencio Pair M.D.   On: 11/05/2018 22:09   Dg Abd 1 View  Result Date: 11/23/2018 CLINICAL DATA:  Nausea, vomiting, and diarrhea. EXAM: ABDOMEN - 1 VIEW COMPARISON:  CT abdomen pelvis dated October 21, 2018. Abdominal x-ray dated October 16, 2018. FINDINGS: The bowel gas pattern is normal. No radio-opaque calculi or other significant radiographic abnormality are seen. Prior cholecystectomy. No acute osseous abnormality. Left-sided pleural effusion with left chest tube in place. IMPRESSION: No acute findings. Electronically Signed   By: Titus Dubin M.D.   On: 11/23/2018 14:07   Dg Chest Port 1 View  Result Date: 11/19/2018 CLINICAL DATA:  Cough EXAM: PORTABLE CHEST 1 VIEW COMPARISON:  Radiograph 11/18/2018, CTA 10/16/2018 FINDINGS: Stable position of a left PleurX catheter. No visible residual pneumothorax. Small right and moderate left pleural effusions are similar to comparison exam. Central venous congestion and cardiomegaly are unchanged from prior. Atherosclerotic calcification of the aortic arch is again noted. Some bandlike areas of opacity in the lung bases likely reflect atelectasis. Additional airspace opacities in the lung bases could reflect atelectasis or edema. No acute osseous or soft tissue abnormality. Degenerative changes are present in the imaged spine and shoulders. IMPRESSION: 1. Stable left PleurX catheter. No visible residual pneumothorax. 2. Unchanged small right and moderate left pleural effusions. 3. Unchanged cardiomegaly and central venous congestion. Bibasilar airspace opacities could reflect atelectasis or edema. Electronically Signed   By: Lovena Le M.D.   On: 11/19/2018 20:01   Dg Chest Port 1 View  Result Date: 11/18/2018 CLINICAL DATA:  Shortness of breath.  Weakness.  Pleural effusion.  EXAM: PORTABLE  CHEST 1 VIEW COMPARISON:  11/17/2018. FINDINGS: Left PleurX catheter in stable position. Cardiomegaly with pulmonary venous congestion again noted without interim change. Low lung volumes. Bibasilar infiltrates/edema again noted. Small unchanged bilateral pleural effusions again noted. No pneumothorax. IMPRESSION: 1.  Left PleurX catheter in stable position.  No pneumothorax. 2. Cardiomegaly with pulmonary venous congestion again noted without interim change. 3. Low lung volumes. Bibasilar and infiltrates/edema again noted. Small unchanged bilateral pleural effusions again noted. Electronically Signed   By: Marcello Moores  Register   On: 11/18/2018 06:44   Dg Chest Port 1 View  Result Date: 11/17/2018 CLINICAL DATA:  Shortness of breath. EXAM: PORTABLE CHEST 1 VIEW COMPARISON:  Radiograph yesterday. CT 10/16/2018 FINDINGS: Left PleurX catheter remains in place. Low lung volumes with bibasilar volume loss. Hazy bibasilar opacities consistent with pleural effusions adjacent atelectasis/consolidation. Cardiomegaly, not well assessed due to adjacent airspace disease. Worsening vascular congestion. No visualized pneumothorax. IMPRESSION: 1. Worsening vascular congestion. 2. Bibasilar opacities consistent with pleural effusions and adjacent atelectasis/consolidation. 3. Left PleurX catheter remains in place. No visualized pneumothorax. 4. Cardiomegaly. Electronically Signed   By: Keith Rake M.D.   On: 11/17/2018 02:19   Dg Chest Port 1 View  Result Date: 11/16/2018 CLINICAL DATA:  Shortness of breath EXAM: PORTABLE CHEST 1 VIEW COMPARISON:  November 09, 2018 FINDINGS: There is mild cardiomegaly. Again noted is a left-sided pleural catheter. Aortic knob calcifications. There is a small left pleural effusion with patchy airspace opacity at the left lung base which is new. There is a trace right pleural effusion with hazy airspace opacity at the right lung base. IMPRESSION: Small bilateral pleural  effusions, left greater than right with new hazy airspace opacities at both lung bases which could be due to infectious etiology, layering effusion, and/or edema. Cardiomegaly Left-sided pleural catheter. Electronically Signed   By: Prudencio Pair M.D.   On: 11/16/2018 19:15   Dg Chest Port 1 View  Result Date: 11/09/2018 CLINICAL DATA:  Short of breath and weakness. EXAM: PORTABLE CHEST 1 VIEW COMPARISON:  1 day prior FINDINGS: Left-sided PleurX catheter remains in place. The Chin overlies the apices. Moderate cardiomegaly. Increase in small left pleural effusion. Given limitations of chin overlying the left apex, no residual pneumothorax is identified. Similar right and worsened left base airspace disease. Suspect developing mild interstitial edema. IMPRESSION: Worsened aeration, with probable developing interstitial edema and progressive bibasilar airspace disease. Left pleural drain in place, without convincing evidence of residual pneumothorax. Electronically Signed   By: Abigail Miyamoto M.D.   On: 11/09/2018 10:53   Ir Perc Pleural Drain W/indwell Cath W/img Guide  Result Date: 11/08/2018 INDICATION: 78 year old female with stage IV breast cancer and a recurrent symptomatic left-sided malignant pleural effusion. She presents for placement of a tunneled pleural drainage catheter. EXAM: Placement of a tunneled pleural drainage catheter MEDICATIONS: 2 g Ancef ANESTHESIA/SEDATION: Fentanyl 50 mcg IV; Versed 1.5 mg IV Moderate Sedation Time:  20 minutes The patient was continuously monitored during the procedure by the interventional radiology nurse under my direct supervision. COMPLICATIONS: None immediate. PROCEDURE: Informed written consent was obtained from the patient after a thorough discussion of the procedural risks, benefits and alternatives. All questions were addressed. Maximal Sterile Barrier Technique was utilized including caps, mask, sterile gowns, sterile gloves, sterile drape, hand hygiene and  skin antiseptic. A timeout was performed prior to the initiation of the procedure. Ultrasound was used to interrogate the left chest. A large pleural effusion is present. A suitable skin entry site was selected  and marked. Local anesthesia was attained by infiltration with 1% lidocaine. A small dermatotomy was made. An 18 gauge sheath needle was then advanced over a rib and into the pleural space. The sheath was left in place while the 18 gauge needle was removed. A short Amplatz wire was advanced through the sheath needle and positioned in the pleural space. A suitable skin exit site 5 cm anterior and medial to the pleural entry site was identified. Local anesthesia was again attained by infiltration with 1% lidocaine. A second small dermatotomy was made. The pleural drainage catheter was then tunneled from the exit site to the dermatotomy overlying the pleural entry site. The sheath needle was then removed over the wire. A peel-away sheath was advanced over the wire and into the pleural space. The tunneled drainage catheter was then advanced through the peel-away sheath and the peel-away sheath was discarded. The catheter was connected to a vacuum bottle and approximately 700 mL of pleural fluid was evacuated. Further aspiration was stopped due to symptomatic coughing. The catheter was secured to the skin with 0 Prolene suture. The dermatotomy overlying the pleural entry site was closed with Dermabond. Sterile bandages were placed. IMPRESSION: Technically successful placement of a left tunneled pleural drainage catheter. Signed, Criselda Peaches, MD, [facility] Radiology Electronically Signed   By: Jacqulynn Cadet M.D.   On: 11/08/2018 10:05   US Thoracentesis Asp Pleural Space W/img Guide  Result Date: 11/06/2018 INDICATION: Patient with history of CHF, chronic kidney disease, dyspnea, metastatic breast cancer, recurrent left pleural effusion.  Request made for therapeutic left thoracentesis. EXAM: ULTRASOUND GUIDED THERAPEUTIC LEFT THORACENTESIS MEDICATIONS: None COMPLICATIONS: None immediate. PROCEDURE: An ultrasound guided thoracentesis was thoroughly discussed with the patient and questions answered. The benefits, risks, alternatives and complications were also discussed. The patient understands and wishes to proceed with the procedure. Written consent was obtained. Ultrasound was performed to localize and mark an adequate pocket of fluid in the left chest. The area was then prepped and draped in the normal sterile fashion. 1% Lidocaine was used for local anesthesia. Under ultrasound guidance a 6 Fr Safe-T-Centesis catheter was introduced. Thoracentesis was performed. The catheter was removed and a dressing applied. FINDINGS: A total of approximately 900 cc of amber/blood-tinged fluid was removed. Due to persistent patient coughing only the above amount of fluid was removed today. IMPRESSION: Successful ultrasound guided therapeutic left thoracentesis yielding 900 cc of pleural fluid. Read by: Rowe Robert, PA-C Electronically Signed   By: Jacqulynn Cadet M.D.   On: 11/06/2018 14:11    ASSESSMENT & PLAN:   #1 Rt breast metastastic cancer ERE/PR+, Her2 neg  10/14/2018 CT Chest ([id number])  revealed "1. No convincing pneumonia. 2. Moderate left and small right pleural effusions. There is mild bilateral interstitial thickening most evident in the lower lungs. This suggests mild interstitial edema. 3. Dependent atelectasis in the lower lobes, left greater than right, with milder atelectasis in the left upper lobe. 4. Small lung nodules most evident in the right middle lobe. 5. Abnormal soft tissue contiguous with the skin of the medial right breast. Although this may be infectious/inflammatory, neoplastic disease should be considered. Consider follow-up diagnostic mammography and possible right breast ultrasound for further  assessment."  10/16/2018 CT Angio Chest ([id number])  revealed "1. Technically adequate exam showing no acute pulmonary embolus. 2. Small LEFT pneumothorax, estimated to be 5-10% of lung volume. 3. Small to moderate LEFT pleural effusion and associated Atelectasis. 4. Trace RIGHT pleural  effusion and basilar atelectasis. 5. Cardiomegaly and coronary artery disease. 6. RIGHT breast skin thickening and irregularity suspicious for malignancy. Recommend diagnostic mammography and ultrasound when the patient is able. 7. Small bilateral axillary lymph nodes, 1 centimeter in diameter. 8. Aortic Atherosclerosis (ICD10-I70.0)."  10/16/2018 Surgical Pathology of right breast excision reveals "Metastatic Carcinoma. Tumor cells are NEGATIVE for Her2".  10/21/2018 MRI Brain ([id number]) revealed "Negative MRI brain with contrast. Negative for metastatic disease."  10/21/2018 CT Abd/Pel ([id number]) revealed "1. Large malignant left-sided effusion. Trace effusion on the right with basilar atelectasis. 2. Variation in parenchymal enhancement raising the question of hilar lesion in the left chest, consider CT chest if not performed for further evaluation. 3. 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 4. Endometrial thickening may represent a second primary, consider endovaginal sonogram for further assessment as warranted. 5. Question of added enhancement in these central canal along the spinal cord internal nerve roots. This could be seen in the setting of CNS involvement but is not certain. MRI of the spine for additional staging could be performed as warranted. 6. Area of vague hypoattenuation anterior and inferior to portal structures in medial section left hepatic lobe likely an area of fatty infiltration, attention on follow-up or assess with MR for definitive characterization."  #2 Malignant Pleural  effusion moderate left and small right-sided pleural effusion with shortness of breath. 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  IHC not done to determine primary site. Pneumothorax status post thoracentesis  #3 Congestive heart failure #4 Atrial fibrillation with RVR on therapeutic anticoagulation #5 CKD #6 hypertension #7 coag negative staph on blood cultures thought to be contaminant  PLAN:  -Discussed pt labwork, 11/22/18; all values are WNL except for RBC at 3.52, Hgb at 10.0, HCT at 32.6, PLTs at 406K, Chloride at 90, CO2 at 34, Glucose at 103, BUN at 27, Creatinine at 1.56, Calcium at 8.8, GFR Est Non Af Am at 31. -Recommended that the pt continue to eat well, drink at least 48-64 oz of water each day, and walk 20-30 minutes each day.  -Will begin 50 mg Verzenio BID for 2 weeks before re-evaluating  -continue letrozole 2.17m po daily -Continue Xgeva shots once a month  -Continue Ergocalciferol  -Rx Hydroxyzine, pt advised to alternate with Zofran  -Will see back in 2 weeks with labs    FOLLOW UP: RTC with Dr KIrene Limbowith labs in 2 weeks Plz schedule Xgeva q4weeks - plz schedule next 3 doses  The total time spent in the appt was 35 minutes and more than 50% was on counseling and direct patient cares.  All of the patient's questions were answered with apparent satisfaction. The patient knows to call the clinic with any problems, questions or concerns.   GSullivan LoneMD MAtwoodAAHIVMS SSentara Albemarle Medical CenterCEating Recovery CenterHematology/Oncology Physician [facility] (Office):       [phone](Work cell):  [phone](Fax):           [phone] 12/03/2018 4:07 PM  I, JYevette Edwards am acting as a scribe for Dr. GSullivan Lone   .I have reviewed the above documentation for accuracy and completeness, and I agree with the above. .Brunetta GeneraMD

## 2018-12-03 NOTE — Telephone Encounter (Signed)
Scheduled per 11/03 los, patient received after visit summary and calender.

## 2018-12-03 NOTE — Patient Instructions (Signed)
Denosumab injection What is this medicine? DENOSUMAB (den oh sue mab) slows bone breakdown. Prolia is used to treat osteoporosis in women after menopause and in men, and in people who are taking corticosteroids for 6 months or more. Xgeva is used to treat a high calcium level due to cancer and to prevent bone fractures and other bone problems caused by multiple myeloma or cancer bone metastases. Xgeva is also used to treat giant cell tumor of the bone. This medicine may be used for other purposes; ask your health care provider or pharmacist if you have questions. COMMON BRAND NAME(S): Prolia, XGEVA What should I tell my health care provider before I take this medicine? They need to know if you have any of these conditions:  dental disease  having surgery or tooth extraction  infection  kidney disease  low levels of calcium or Vitamin D in the blood  malnutrition  on hemodialysis  skin conditions or sensitivity  thyroid or parathyroid disease  an unusual reaction to denosumab, other medicines, foods, dyes, or preservatives  pregnant or trying to get pregnant  breast-feeding How should I use this medicine? This medicine is for injection under the skin. It is given by a health care professional in a hospital or clinic setting. A special MedGuide will be given to you before each treatment. Be sure to read this information carefully each time. For Prolia, talk to your pediatrician regarding the use of this medicine in children. Special care may be needed. For Xgeva, talk to your pediatrician regarding the use of this medicine in children. While this drug may be prescribed for children as young as 13 years for selected conditions, precautions do apply. Overdosage: If you think you have taken too much of this medicine contact a poison control center or emergency room at once. NOTE: This medicine is only for you. Do not share this medicine with others. What if I miss a dose? It is  important not to miss your dose. Call your doctor or health care professional if you are unable to keep an appointment. What may interact with this medicine? Do not take this medicine with any of the following medications:  other medicines containing denosumab This medicine may also interact with the following medications:  medicines that lower your chance of fighting infection  steroid medicines like prednisone or cortisone This list may not describe all possible interactions. Give your health care provider a list of all the medicines, herbs, non-prescription drugs, or dietary supplements you use. Also tell them if you smoke, drink alcohol, or use illegal drugs. Some items may interact with your medicine. What should I watch for while using this medicine? Visit your doctor or health care professional for regular checks on your progress. Your doctor or health care professional may order blood tests and other tests to see how you are doing. Call your doctor or health care professional for advice if you get a fever, chills or sore throat, or other symptoms of a cold or flu. Do not treat yourself. This drug may decrease your body's ability to fight infection. Try to avoid being around people who are sick. You should make sure you get enough calcium and vitamin D while you are taking this medicine, unless your doctor tells you not to. Discuss the foods you eat and the vitamins you take with your health care professional. See your dentist regularly. Brush and floss your teeth as directed. Before you have any dental work done, tell your dentist you are   receiving this medicine. Do not become pregnant while taking this medicine or for 5 months after stopping it. Talk with your doctor or health care professional about your birth control options while taking this medicine. Women should inform their doctor if they wish to become pregnant or think they might be pregnant. There is a potential for serious side  effects to an unborn child. Talk to your health care professional or pharmacist for more information. What side effects may I notice from receiving this medicine? Side effects that you should report to your doctor or health care professional as soon as possible:  allergic reactions like skin rash, itching or hives, swelling of the face, lips, or tongue  bone pain  breathing problems  dizziness  jaw pain, especially after dental work  redness, blistering, peeling of the skin  signs and symptoms of infection like fever or chills; cough; sore throat; pain or trouble passing urine  signs of low calcium like fast heartbeat, muscle cramps or muscle pain; pain, tingling, numbness in the hands or feet; seizures  unusual bleeding or bruising  unusually weak or tired Side effects that usually do not require medical attention (report to your doctor or health care professional if they continue or are bothersome):  constipation  diarrhea  headache  joint pain  loss of appetite  muscle pain  runny nose  tiredness  upset stomach This list may not describe all possible side effects. Call your doctor for medical advice about side effects. You may report side effects to FDA at 1-800-FDA-1088. Where should I keep my medicine? This medicine is only given in a clinic, doctor's office, or other health care setting and will not be stored at home. NOTE: This sheet is a summary. It may not cover all possible information. If you have questions about this medicine, talk to your doctor, pharmacist, or health care provider.  2020 Elsevier/Gold Standard (2017-05-25 16:10:44)

## 2018-12-03 NOTE — Progress Notes (Signed)
Met with patient/son to obtain signature for grant approval.  Patient approved for one-time $1000 [employer] grant to assist with personal expenses while going through treatment.  Went over in detail expenses and how they are covered. Gave her a copy of the approval letter and expense sheet as well as the Outpatient pharmacy information. She received a gas card today from her grant.  Gave her my card for any additional financial questions or concerns.

## 2018-12-04 ENCOUNTER — Telehealth: Payer: Self-pay | Admitting: *Deleted

## 2018-12-04 NOTE — Progress Notes (Signed)
Cardiology Office Note:    Date:  12/05/2018   ID:  Beverly Tucker, DOB Nov 18, 1940, MRN [id number]  PCP:  Beverly Sacramento, MD  Cardiologist:  Beverly Dawley, MD  Referring MD: Beverly Sacramento, MD   Chief Complaint  Patient presents with  . Hospitalization Follow-up  . Atrial Fibrillation    History of Present Illness:    Beverly Tucker is a 78 y.o. 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  The patient is treated with abemaciclib and on letrozole for breast cancer.  The patient was hospitalized 10/14/2018 and found to have a new left pleural effusion and new fungating mass on breast with thoracentesis showing exudative effusion with adenocarcinoma confirmed to be metastatic breast cancer.  She was set up with oncology for initiation of treatment.  She had atrial fibrillation with RVR during that hospitalization and was treated with metoprolol and Eliquis. CHADSVASC score of 5 due to age, htn, chf, gender.  The patient presented to the hospital on 11/16/2018 with a 1 week history of worsening shortness of breath, leg edema and palpitations.  In the ED she was found to have SVT with heart rate up to the 180s.  She was given 6 mg of adenosine with conversion to sinus rhythm.  Troponin was mildly elevated at 32 and WBCs 13.7, BNP 183.  Chest x-ray showed small bilateral pleural effusions and somewhat new hazy opacity.  She was started on empiric azithromycin.  She was admitted and treated for acute on chronic diastolic heart failure and treated with IV diuresis.  She had some recurrent SVT episodes and again required adenosine.  Metoprolol was further adjusted with maintenance of sinus rhythm.  She was taken off chemo in the hospital.   Beverly Tucker is here today for hospital follow up. She is wearing oxygen. She notes that she has had anxiety attacks  since her husband died. This morning she had a fast heart beat. It lasted about 30 minutes. She had no chest discomfort, shortness of breath or lightheadedness. It felt to her like an anxiety attack.   She says that she went back to her oncologist who started an oral med, Xgeva. She restarted it last night. She thought that it was the chemo that caused her fast heart beat and this scared her since she really wants the chemo to work. This made her more worked up.   She was also watching news on the presidential election when she had symptoms and she says she was a wreck.   Has L Pleurx. She says her darinage is decreasing.   She says that she has had a poor appetite but is gradually improving.  She is working with home physical therapy.  Cardiac studies   TTE 11/21/2018 IMPRESSIONS 1. Left ventricular ejection fraction, by visual estimation, is 55 to 60%. The left ventricle has normal function. Normal left ventricular size. There is no left ventricular hypertrophy. 2. Left ventricular diastolic Doppler parameters are consistent with impaired relaxation pattern of LV diastolic filling. 3. Global right ventricle has normal systolic function.The right ventricular size is normal. No increase in right ventricular wall thickness. 4. Left atrial size was normal. 5. Right atrial size was normal. 6. Moderate thickening of the mitral valve leaflet(s). 7. Severe mitral annular calcification. 8. The mitral valve is normal in structure. Trace mitral valve regurgitation. 9. The  tricuspid valve is grossly normal. Tricuspid valve regurgitation is mild. 10. The aortic valve is tricuspid Aortic valve regurgitation is mild by color flow Doppler. Mild to moderate aortic valve sclerosis/calcification without any evidence of aortic stenosis. 11. The pulmonic valve was grossly normal. Pulmonic valve regurgitation is mild by color flow Doppler  Past Medical History:  Diagnosis Date  . Acute CHF  (congestive heart failure) ([place]) 10/2018  . Atrial fibrillation ([place])   . Cancer (HCC)    Breast   . GERD (gastroesophageal reflux disease)   . Hypertension     Past Surgical History:  Procedure Laterality Date  . CHOLECYSTECTOMY    . IR PERC PLEURAL DRAIN W/INDWELL CATH W/IMG GUIDE  11/08/2018  . IR THORACENTESIS ASP PLEURAL SPACE W/IMG GUIDE  10/22/2018    Current Medications: Current Meds  Medication Sig  . ALPRAZolam (XANAX) 0.5 MG tablet Take 0.25-0.5 mg by mouth 2 (two) times daily as needed for anxiety.  Marland Kitchen apixaban (ELIQUIS) 5 MG TABS tablet Take 1 tablet (5 mg total) by mouth 2 (two) times daily.  . ergocalciferol (VITAMIN D2) 1.25 MG (50000 UT) capsule Take 1 capsule (50,000 Units total) by mouth once a week.  . furosemide (LASIX) 40 MG tablet Take 1 tablet (40 mg total) by mouth daily.  . hydrOXYzine (ATARAX/VISTARIL) 10 MG tablet Take 1 tablet (10 mg total) by mouth every 8 (eight) hours as needed for anxiety or nausea.  Marland Kitchen loperamide (IMODIUM) 2 MG capsule Take 1 capsule (2 mg total) by mouth as needed for diarrhea or loose stools.  . metoprolol tartrate 75 MG TABS Take 75 mg by mouth 2 (two) times daily.  . ondansetron (ZOFRAN ODT) 4 MG disintegrating tablet Take 1 tablet (4 mg total) by mouth every 8 (eight) hours as needed for nausea or vomiting.     Allergies:   Patient has no known allergies.   Social History   Socioeconomic History  . Marital status: Widowed    Spouse name: Not on file  . Number of children: Not on file  . Years of education: Not on file  . Highest education level: Not on file  Occupational History  . Not on file  Social Needs  . Financial resource strain: Not on file  . Food insecurity    Worry: Not on file    Inability: Not on file  . Transportation needs    Medical: Not on file    Non-medical: Not on file  Tobacco Use  . Smoking status: Never Smoker  . Smokeless tobacco: Never Used  Substance and Sexual Activity  . Alcohol use:  Never    Frequency: Never  . Drug use: Never  . Sexual activity: Not on file  Lifestyle  . Physical activity    Days per week: Not on file    Minutes per session: Not on file  . Stress: Not on file  Relationships  . Social Herbalist on phone: Not on file    Gets together: Not on file    Attends religious service: Not on file    Active member of club or organization: Not on file    Attends meetings of clubs or organizations: Not on file    Relationship status: Not on file  Other Topics Concern  . Not on file  Social History Narrative  . Not on file     Family History: The patient's family history includes Diabetes Mellitus II in an other family member. ROS:  Please see the history of present illness.     All other systems reviewed and are negative.   EKG:  EKG is  ordered today.  The ekg ordered today demonstrates sinus rhythm with first-degree AV block, 60 bpm, QTC 426  Recent Labs: 11/16/2018: B Natriuretic Peptide 183.7; TSH 2.545 11/19/2018: Magnesium 1.8 11/22/2018: Hemoglobin 10.0; Platelets 406 11/23/2018: ALT 16 11/24/2018: BUN 29; Creatinine, Ser 1.46; Potassium 3.6; Sodium 138   Recent Lipid Panel    Component Value Date/Time   CHOL 165 11/17/2018 0031   TRIG 132 11/17/2018 0031   HDL 36 (L) 11/17/2018 0031   CHOLHDL 4.6 11/17/2018 0031   VLDL 26 11/17/2018 0031   LDLCALC 103 (H) 11/17/2018 0031    Physical Exam:    VS:  BP 100/60   Pulse 60   Ht 5\' 6"  (1.676 m)   Wt 178 lb (80.7 kg)   SpO2 99%   BMI 28.73 kg/m     Wt Readings from Last 6 Encounters:  12/05/18 178 lb (80.7 kg)  12/03/18 176 lb 9.6 oz (80.1 kg)  11/24/18 178 lb 5.6 oz (80.9 kg)  11/13/18 183 lb 8 oz (83.2 kg)  11/05/18 186 lb (84.4 kg)  11/04/18 189 lb 6.4 oz (85.9 kg)     Physical Exam  Constitutional: She is oriented to person, place, and time. She appears well-developed and well-nourished. No distress.  HENT:  Head: Normocephalic.  Neck: Normal range of  motion. Neck supple. No JVD present.  Cardiovascular: Normal rate, regular rhythm, normal heart sounds and intact distal pulses. Exam reveals no gallop and no friction rub.  No murmur heard. Pulmonary/Chest: Effort normal and breath sounds normal. No respiratory distress. She has no wheezes. She has no rales.  Left chest Pleurx catheter  Abdominal: Soft. Bowel sounds are normal.  Musculoskeletal: Normal range of motion.        General: No edema.  Neurological: She is alert and oriented to person, place, and time.  Skin: Skin is warm and dry.  Psychiatric: She has a normal mood and affect. Her behavior is normal. Judgment and thought content normal.  Vitals reviewed.   ASSESSMENT:    1. Acute on chronic diastolic CHF (congestive heart failure) (HCC)   2. AF (paroxysmal atrial fibrillation) ([place])   3. SVT (supraventricular tachycardia) (HCC)   4. Stage 3a chronic kidney disease   5. Essential (primary) hypertension   6. Metastatic breast cancer ([place])   7. 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    PLAN:    In order of problems listed above:  Acute on chronic diastolic heart failure in the setting of A. fib with RVR -Last echo (10/15/18) EF 65 -70%, echo 11/18/2018 with EF 55-60%  -Patient was diuresed in the hospital with IV Lasix.  Discharged home on Lasix 40 mg daily. -Currently volume status looks good.  SVT -Patient recently treated for episodes of SVT requiring adenosine.  Metoprolol dose was increased with improvement.  She was maintaining sinus rhythm at time of discharge. -She continues in sinus rhythm.  She did have an episode of fast heartbeat this morning with no associated symptoms.  Her baseline heart rate is 60 and blood pressure 100/60 so no room to titrate beta-blocker.  We discussed relaxation techniques and Valsalva maneuver.  Atrial fibrillation -Noted during hospitalization in 10/2018.  Maintaining sinus rhythm on metoprolol. -CHA2DS2-VASc score 5 ( age, htn, chf,  gender).  The patient is anticoagulated with Eliquis for stroke risk reduction.  No unusual bleeding. -  Had episode of tachycardia this am. Now in NSR on EKG  CKD stage IIIa -Patient had acute kidney injury during recent hospitalization, suspected cardiorenal.  Hypertension -Blood pressure is soft but stable.  100/60.  Continue current medications.  Metastatic breast cancer -Follows with Dr. [name], Oncology. On letrozole and abemaciclib (recently started abemaciclib 10/12).Had outpatient follow-up 11/3. Will be started on Zometa outpatient for bone mets.   Recurrent malignant pleural effusion -Pleurx drain placed 11/08/2018.  Draining every other day.   Medication Adjustments/Labs and Tests Ordered: Current medicines are reviewed at length with the patient today.  Concerns regarding medicines are outlined above. Labs and tests ordered and medication changes are outlined in the patient instructions below:  Patient Instructions  Medication Instructions:  Your physician recommends that you continue on your current medications as directed. Please refer to the Current Medication list given to you today.  *If you need a refill on your cardiac medications before your next appointment, please call your pharmacy*  Lab Work: None   If you have labs (blood work) drawn today and your tests are completely normal, you will receive your results only by: Marland Kitchen MyChart Message (if you have MyChart) OR . A paper copy in the mail If you have any lab test that is abnormal or we need to change your treatment, we will call you to review the results.  Testing/Procedures: None   Follow-Up: At [facility], you and your health needs are our priority.  As part of our continuing mission to provide you with exceptional heart care, we have created designated Provider Care Teams.  These Care Teams include your primary Cardiologist (physician) and Advanced Practice Providers (APPs -  Physician Assistants and Nurse  Practitioners) who all work together to provide you with the care you need, when you need it.  Your next appointment:   2  The format for your next appointment:   In Person  Provider:   You may see Dr. Meda Coffee  or one of the following Advanced Practice Providers on your designated Care Team:    Melina Copa, PA-C  Ermalinda Barrios, PA-C   Other Instructions  Atrial Fibrillation  Atrial fibrillation is a type of heartbeat that is irregular or fast (rapid). If you have this condition, your heart beats without any order. This makes it hard for your heart to pump blood in a normal way. Having this condition gives you more risk for stroke, heart failure, and other heart problems. Atrial fibrillation may start all of a sudden and then stop on its own, or it may become a long-lasting problem. What are the causes? This condition may be caused by heart conditions, such as:  High blood pressure.  Heart failure.  Heart valve disease.  Heart surgery. Other causes include:  Pneumonia.  Obstructive sleep apnea.  Lung cancer.  Thyroid disease.  Drinking too much alcohol. Sometimes the cause is not known. What increases the risk? You are more likely to develop this condition if:  You smoke.  You are older.  You have diabetes.  You are overweight.  You have a family history of this condition.  You exercise often and hard. What are the signs or symptoms? Common symptoms of this condition include:  A feeling like your heart is beating very fast.  Chest pain.  Feeling short of breath.  Feeling light-headed or weak.  Getting tired easily. Follow these instructions at home: Medicines  Take over-the-counter and prescription medicines only as told by your doctor.  If  your doctor gives you a blood-thinning medicine, take it exactly as told. Taking too much of it can cause bleeding. Taking too little of it does not protect you against clots. Clots can cause a stroke.  Lifestyle      Do not use any tobacco products. These include cigarettes, chewing tobacco, and e-cigarettes. If you need help quitting, ask your doctor.  Do not drink alcohol.  Do not drink beverages that have caffeine. These include coffee, soda, and tea.  Follow diet instructions as told by your doctor.  Exercise regularly as told by your doctor. General instructions  If you have a condition that causes breathing to stop for a short period of time (apnea), treat it as told by your doctor.  Keep a healthy weight. Do not use diet pills unless your doctor says they are safe for you. Diet pills may make heart problems worse.  Keep all follow-up visits as told by your doctor. This is important. Contact a doctor if:  You notice a change in the speed, rhythm, or strength of your heartbeat.  You are taking a blood-thinning medicine and you see more bruising.  You get tired more easily when you move or exercise.  You have a sudden change in weight. Get help right away if:   You have pain in your chest or your belly (abdomen).  You have trouble breathing.  You have blood in your vomit, poop, or pee (urine).  You have any signs of a stroke. "BE FAST" is an easy way to remember the main warning signs: ? B - Balance. Signs are dizziness, sudden trouble walking, or loss of balance. ? E - Eyes. Signs are trouble seeing or a change in how you see. ? F - Face. Signs are sudden weakness or loss of feeling in the face, or the face or eyelid drooping on one side. ? A - Arms. Signs are weakness or loss of feeling in an arm. This happens suddenly and usually on one side of the body. ? S - Speech. Signs are sudden trouble speaking, slurred speech, or trouble understanding what people say. ? T - Time. Time to call emergency services. Write down what time symptoms started.  You have other signs of a stroke, such as: ? A sudden, very bad headache with no known cause. ? Feeling sick to your  stomach (nausea). ? Throwing up (vomiting). ? Jerky movements you cannot control (seizure). These symptoms may be an emergency. Do not wait to see if the symptoms will go away. Get medical help right away. Call your local emergency services (911 in the [place] Do not drive yourself to the hospital. Summary  Atrial fibrillation is a type of heartbeat that is irregular or fast (rapid).  You are at higher risk of this condition if you smoke, are older, have diabetes, or are overweight.  Follow your doctor's instructions about medicines, diet, exercise, and follow-up visits.  Get help right away if you think that you have signs of a stroke. This information is not intended to replace advice given to you by your health care provider. Make sure you discuss any questions you have with your health care provider. Document Released: 10/26/2007 Document Revised: 03/22/2017 Document Reviewed: 03/09/2017 Elsevier Patient Education  2020 [place], [name], NP  12/05/2018 6:05 PM    [place]

## 2018-12-04 NOTE — Telephone Encounter (Signed)
Son called office to clarify patient's dose of Verzenio. States she has 50 mg tablets and is taking one in AM and one in PM. He said Dr.Kale said to take half of what she is taking now. Should he cut tablets in half and have mother take half in AM and half in PM? Per Dr.Kale - contacted son - patient is to take 50 mg a day, can take 1 tablet in AM. Son verbalized understanding.

## 2018-12-05 ENCOUNTER — Encounter: Payer: Self-pay | Admitting: Cardiology

## 2018-12-05 ENCOUNTER — Other Ambulatory Visit: Payer: Self-pay

## 2018-12-05 ENCOUNTER — Ambulatory Visit (INDEPENDENT_AMBULATORY_CARE_PROVIDER_SITE_OTHER): Payer: Medicare HMO | Admitting: Cardiology

## 2018-12-05 VITALS — BP 100/60 | HR 60 | Ht 66.0 in | Wt 178.0 lb

## 2018-12-05 DIAGNOSIS — I480 Paroxysmal atrial fibrillation: Secondary | ICD-10-CM | POA: Diagnosis not present

## 2018-12-05 DIAGNOSIS — N1831 Chronic kidney disease, stage 3a: Secondary | ICD-10-CM | POA: Diagnosis not present

## 2018-12-05 DIAGNOSIS — J90 Pleural effusion, not elsewhere classified: Secondary | ICD-10-CM

## 2018-12-05 DIAGNOSIS — I10 Essential (primary) hypertension: Secondary | ICD-10-CM

## 2018-12-05 DIAGNOSIS — I5033 Acute on chronic diastolic (congestive) heart failure: Secondary | ICD-10-CM

## 2018-12-05 DIAGNOSIS — I471 Supraventricular tachycardia: Secondary | ICD-10-CM

## 2018-12-05 DIAGNOSIS — C50919 Malignant neoplasm of unspecified site of unspecified female breast: Secondary | ICD-10-CM

## 2018-12-05 NOTE — Patient Instructions (Signed)
Medication Instructions:  Your physician recommends that you continue on your current medications as directed. Please refer to the Current Medication list given to you today.  *If you need a refill on your cardiac medications before your next appointment, please call your pharmacy*  Lab Work: None   If you have labs (blood work) drawn today and your tests are completely normal, you will receive your results only by: Marland Kitchen MyChart Message (if you have MyChart) OR . A paper copy in the mail If you have any lab test that is abnormal or we need to change your treatment, we will call you to review the results.  Testing/Procedures: None   Follow-Up: At [facility], you and your health needs are our priority.  As part of our continuing mission to provide you with exceptional heart care, we have created designated Provider Care Teams.  These Care Teams include your primary Cardiologist (physician) and Advanced Practice Providers (APPs -  Physician Assistants and Nurse Practitioners) who all work together to provide you with the care you need, when you need it.  Your next appointment:   2  The format for your next appointment:   In Person  Provider:   You may see Dr. Meda Coffee  or one of the following Advanced Practice Providers on your designated Care Team:    Melina Copa, PA-C  Ermalinda Barrios, PA-C   Other Instructions  Atrial Fibrillation  Atrial fibrillation is a type of heartbeat that is irregular or fast (rapid). If you have this condition, your heart beats without any order. This makes it hard for your heart to pump blood in a normal way. Having this condition gives you more risk for stroke, heart failure, and other heart problems. Atrial fibrillation may start all of a sudden and then stop on its own, or it may become a long-lasting problem. What are the causes? This condition may be caused by heart conditions, such as:  High blood pressure.  Heart failure.  Heart valve  disease.  Heart surgery. Other causes include:  Pneumonia.  Obstructive sleep apnea.  Lung cancer.  Thyroid disease.  Drinking too much alcohol. Sometimes the cause is not known. What increases the risk? You are more likely to develop this condition if:  You smoke.  You are older.  You have diabetes.  You are overweight.  You have a family history of this condition.  You exercise often and hard. What are the signs or symptoms? Common symptoms of this condition include:  A feeling like your heart is beating very fast.  Chest pain.  Feeling short of breath.  Feeling light-headed or weak.  Getting tired easily. Follow these instructions at home: Medicines  Take over-the-counter and prescription medicines only as told by your doctor.  If your doctor gives you a blood-thinning medicine, take it exactly as told. Taking too much of it can cause bleeding. Taking too little of it does not protect you against clots. Clots can cause a stroke. Lifestyle      Do not use any tobacco products. These include cigarettes, chewing tobacco, and e-cigarettes. If you need help quitting, ask your doctor.  Do not drink alcohol.  Do not drink beverages that have caffeine. These include coffee, soda, and tea.  Follow diet instructions as told by your doctor.  Exercise regularly as told by your doctor. General instructions  If you have a condition that causes breathing to stop for a short period of time (apnea), treat it as told by your  doctor.  Keep a healthy weight. Do not use diet pills unless your doctor says they are safe for you. Diet pills may make heart problems worse.  Keep all follow-up visits as told by your doctor. This is important. Contact a doctor if:  You notice a change in the speed, rhythm, or strength of your heartbeat.  You are taking a blood-thinning medicine and you see more bruising.  You get tired more easily when you move or exercise.  You  have a sudden change in weight. Get help right away if:   You have pain in your chest or your belly (abdomen).  You have trouble breathing.  You have blood in your vomit, poop, or pee (urine).  You have any signs of a stroke. "BE FAST" is an easy way to remember the main warning signs: ? B - Balance. Signs are dizziness, sudden trouble walking, or loss of balance. ? E - Eyes. Signs are trouble seeing or a change in how you see. ? F - Face. Signs are sudden weakness or loss of feeling in the face, or the face or eyelid drooping on one side. ? A - Arms. Signs are weakness or loss of feeling in an arm. This happens suddenly and usually on one side of the body. ? S - Speech. Signs are sudden trouble speaking, slurred speech, or trouble understanding what people say. ? T - Time. Time to call emergency services. Write down what time symptoms started.  You have other signs of a stroke, such as: ? A sudden, very bad headache with no known cause. ? Feeling sick to your stomach (nausea). ? Throwing up (vomiting). ? Jerky movements you cannot control (seizure). These symptoms may be an emergency. Do not wait to see if the symptoms will go away. Get medical help right away. Call your local emergency services (911 in the [place] Do not drive yourself to the hospital. Summary  Atrial fibrillation is a type of heartbeat that is irregular or fast (rapid).  You are at higher risk of this condition if you smoke, are older, have diabetes, or are overweight.  Follow your doctor's instructions about medicines, diet, exercise, and follow-up visits.  Get help right away if you think that you have signs of a stroke. This information is not intended to replace advice given to you by your health care provider. Make sure you discuss any questions you have with your health care provider. Document Released: 10/26/2007 Document Revised: 03/22/2017 Document Reviewed: 03/09/2017 Elsevier Patient Education  2020  [employer].

## 2018-12-06 NOTE — Telephone Encounter (Signed)
Oral Oncology Patient Advocate Encounter  Received notification from [facility] Patient Assistance program that patient has been successfully enrolled into their program to receive Verzenio from the manufacturer at $0 out of pocket until 01/30/19.    I called and left a detailed voicemail for the patients son, [name]  He knows we will have to re-apply.   Patient knows to call the office with questions or concerns.   Oral Oncology Clinic will continue to follow.  [place] [facility] [place] Phone [phone] Fax [phone] 12/06/2018    11:13 AM

## 2018-12-09 ENCOUNTER — Telehealth: Payer: Self-pay

## 2018-12-09 MED ORDER — ABEMACICLIB 50 MG PO TABS: 50.0000 mg | ORAL_TABLET | Freq: Two times a day (BID) | ORAL | Status: DC

## 2018-12-09 MED ORDER — LETROZOLE 2.5 MG PO TABS: 2.5000 mg | ORAL_TABLET | Freq: Every day | ORAL | Status: DC

## 2018-12-09 NOTE — Telephone Encounter (Signed)
Patient's son [name] called and stated patient has refused to take [facility] Saturday and Sunday due to aches. Denies nausea, diarrhea, or any other symptoms. [name] stated Dr. [name] mentioned possibly switching to another med-Kisqali. Per Dr. [name], patient should try Tylenol for the aches and try to resume Verzenio. [name] made aware and stated he will talk to his mom tonight and see if he can get her to restart Verzenio. [name] will call the office to update.

## 2018-12-13 ENCOUNTER — Telehealth: Payer: Self-pay | Admitting: Cardiology

## 2018-12-13 ENCOUNTER — Other Ambulatory Visit: Payer: Self-pay | Admitting: Hematology

## 2018-12-13 ENCOUNTER — Telehealth: Payer: Self-pay | Admitting: *Deleted

## 2018-12-13 MED ORDER — CLOTRIMAZOLE 1 % EX OINT: 1.0000 "application " | TOPICAL_OINTMENT | Freq: Two times a day (BID) | CUTANEOUS | 1 refills | Status: DC

## 2018-12-13 NOTE — Telephone Encounter (Signed)
Noted in Epic that home health RN did reach out to Oncology for requested verbal orders to drain the pts pleurx twice weekly.  Oncology provided these verbal orders.

## 2018-12-13 NOTE — Telephone Encounter (Signed)
Left a detailed message on Shraddha RN with [facility] VM that per Dr. Meda Coffee, we do not advise on Pleurx drain, and she advised that she reach back out to the pts Oncologist for requested orders, or PCP.  Left Agapito Games a detailed message to call the office back with any further questions or concerns regarding message left.

## 2018-12-13 NOTE — Telephone Encounter (Signed)
We dont do that, I would start with her oncologist.

## 2018-12-13 NOTE — Telephone Encounter (Signed)
Agapito Games with [facility] is calling to ask if Dr. Meda Coffee would sign pts pleurx drain to two times a week, vs EOD now.  Agapito Games RN with [name] is unsure where the original orders for the pts Pleurx drain came from, for it was on her discharge papers from where she was last in the hospital. Agapito Games stated she tried reaching out to her Oncologist for further orders, and they advised her to call Cardiology for this. When looking in Epic, I am also unsure who follows these orders.  Informed Agapito Games RN that I will route this message to Dr. Meda Coffee for further review and recommendation, and follow-up with her shortly thereafter. Agapito Games verbalized understanding and agrees with this plan.

## 2018-12-13 NOTE — Telephone Encounter (Signed)
New Message  Agapito Games [facility] nurse) is calling in to get the order updated to have the fluid drained off of patient's leg twice a week vs every other day. Please give Agapito Games a call back to confirm.

## 2018-12-13 NOTE — Telephone Encounter (Signed)
Contacted by home health nurse, Alma Downs, [place] w/[facility]. She asked for verbal order for skilled nurse to drain pleurex twice weekly, family not avaiable to teach and patient not able - hospital d/c note was for every other day.She is also going to check with cardiology. Patient was to f/u with cardiology 2 weeks post d/c. It is draining 200-300 cc when emptied twice weekly.She also noted patient has fungal infection under right breast, near open area. Dr. [name] informed. Per Dr. [name]: Faythe Ghee to drain her Pleurx catheter twice weekly. Prescription for clotrimazole ointment sent to her pharmacy for her fungal infection under the right breast. Would also advise loose fitting clothing to allow the area to air out. Avoid undergarments that might cause friction trauma to the area. Contacted Alma Downs [phone]) to give verbal order for draining pleurx cath and to inform of prescription sent for fungal rash. She verbalized understanding and states she will inform family.

## 2018-12-13 NOTE — Progress Notes (Unsigned)
-   clotrimazole

## 2018-12-14 ENCOUNTER — Emergency Department (HOSPITAL_COMMUNITY): Payer: Medicare HMO

## 2018-12-14 ENCOUNTER — Other Ambulatory Visit: Payer: Self-pay

## 2018-12-14 ENCOUNTER — Inpatient Hospital Stay (HOSPITAL_COMMUNITY)
Admission: EM | Admit: 2018-12-14 | Discharge: 2018-12-21 | DRG: 193 | Disposition: A | Discharge status: Home Health | Payer: Medicare HMO | Attending: Internal Medicine | Admitting: Internal Medicine

## 2018-12-14 ENCOUNTER — Encounter (HOSPITAL_COMMUNITY): Payer: Self-pay | Admitting: Emergency Medicine

## 2018-12-14 DIAGNOSIS — K219 Gastro-esophageal reflux disease without esophagitis: Secondary | ICD-10-CM

## 2018-12-14 DIAGNOSIS — E876 Hypokalemia: Secondary | ICD-10-CM

## 2018-12-14 DIAGNOSIS — R0602 Shortness of breath: Secondary | ICD-10-CM | POA: Diagnosis present

## 2018-12-14 DIAGNOSIS — Z853 Personal history of malignant neoplasm of breast: Secondary | ICD-10-CM

## 2018-12-14 DIAGNOSIS — Z9049 Acquired absence of other specified parts of digestive tract: Secondary | ICD-10-CM

## 2018-12-14 DIAGNOSIS — F419 Anxiety disorder, unspecified: Secondary | ICD-10-CM | POA: Diagnosis present

## 2018-12-14 DIAGNOSIS — N183 Chronic kidney disease, stage 3 unspecified: Secondary | ICD-10-CM | POA: Diagnosis present

## 2018-12-14 DIAGNOSIS — J910 Malignant pleural effusion: Secondary | ICD-10-CM | POA: Diagnosis present

## 2018-12-14 DIAGNOSIS — I480 Paroxysmal atrial fibrillation: Secondary | ICD-10-CM | POA: Diagnosis present

## 2018-12-14 DIAGNOSIS — I471 Supraventricular tachycardia, unspecified: Secondary | ICD-10-CM

## 2018-12-14 DIAGNOSIS — D649 Anemia, unspecified: Secondary | ICD-10-CM | POA: Diagnosis present

## 2018-12-14 DIAGNOSIS — I10 Essential (primary) hypertension: Secondary | ICD-10-CM | POA: Diagnosis not present

## 2018-12-14 DIAGNOSIS — C50919 Malignant neoplasm of unspecified site of unspecified female breast: Secondary | ICD-10-CM | POA: Diagnosis present

## 2018-12-14 DIAGNOSIS — N179 Acute kidney failure, unspecified: Secondary | ICD-10-CM | POA: Diagnosis present

## 2018-12-14 DIAGNOSIS — I959 Hypotension, unspecified: Secondary | ICD-10-CM | POA: Diagnosis present

## 2018-12-14 DIAGNOSIS — I248 Other forms of acute ischemic heart disease: Secondary | ICD-10-CM | POA: Diagnosis present

## 2018-12-14 DIAGNOSIS — Z20828 Contact with and (suspected) exposure to other viral communicable diseases: Secondary | ICD-10-CM | POA: Diagnosis present

## 2018-12-14 DIAGNOSIS — Z79899 Other long term (current) drug therapy: Secondary | ICD-10-CM | POA: Diagnosis not present

## 2018-12-14 DIAGNOSIS — R0600 Dyspnea, unspecified: Secondary | ICD-10-CM

## 2018-12-14 DIAGNOSIS — Z833 Family history of diabetes mellitus: Secondary | ICD-10-CM | POA: Diagnosis not present

## 2018-12-14 DIAGNOSIS — J189 Pneumonia, unspecified organism: Principal | ICD-10-CM

## 2018-12-14 DIAGNOSIS — I5033 Acute on chronic diastolic (congestive) heart failure: Secondary | ICD-10-CM | POA: Diagnosis present

## 2018-12-14 DIAGNOSIS — I5032 Chronic diastolic (congestive) heart failure: Secondary | ICD-10-CM

## 2018-12-14 DIAGNOSIS — Z7901 Long term (current) use of anticoagulants: Secondary | ICD-10-CM

## 2018-12-14 DIAGNOSIS — Y95 Nosocomial condition: Secondary | ICD-10-CM | POA: Diagnosis present

## 2018-12-14 DIAGNOSIS — E875 Hyperkalemia: Secondary | ICD-10-CM | POA: Diagnosis present

## 2018-12-14 DIAGNOSIS — I130 Hypertensive heart and chronic kidney disease with heart failure and stage 1 through stage 4 chronic kidney disease, or unspecified chronic kidney disease: Secondary | ICD-10-CM | POA: Diagnosis present

## 2018-12-14 DIAGNOSIS — R002 Palpitations: Secondary | ICD-10-CM

## 2018-12-14 DIAGNOSIS — I361 Nonrheumatic tricuspid (valve) insufficiency: Secondary | ICD-10-CM | POA: Diagnosis not present

## 2018-12-14 HISTORY — DX: Supraventricular tachycardia: I47.1

## 2018-12-14 HISTORY — DX: Paroxysmal atrial fibrillation: I48.0

## 2018-12-14 HISTORY — DX: Malignant pleural effusion: J91.0

## 2018-12-14 HISTORY — DX: Supraventricular tachycardia, unspecified: I47.10

## 2018-12-14 HISTORY — DX: Chronic diastolic (congestive) heart failure: I50.32

## 2018-12-14 HISTORY — DX: Malignant neoplasm of unspecified site of unspecified female breast: C50.919

## 2018-12-14 HISTORY — DX: Chronic kidney disease, stage 3 unspecified: N18.30

## 2018-12-14 HISTORY — DX: Anemia, unspecified: D64.9

## 2018-12-14 LAB — URINALYSIS, ROUTINE W REFLEX MICROSCOPIC
Bilirubin Urine: NEGATIVE
Glucose, UA: NEGATIVE mg/dL
Ketones, ur: 5 mg/dL — AB
Nitrite: NEGATIVE
Protein, ur: 100 mg/dL — AB
Specific Gravity, Urine: >1.046 — ABNORMAL HIGH (ref 1.005–1.030)
WBC, UA: >50 WBC/hpf — ABNORMAL HIGH (ref 0–5)
pH: 6 (ref 5.0–8.0)

## 2018-12-14 LAB — CBC
HCT: 35.9 % — ABNORMAL LOW (ref 36.0–46.0)
Hemoglobin: 10.8 g/dL — ABNORMAL LOW (ref 12.0–15.0)
MCH: 29 pg (ref 26.0–34.0)
MCHC: 30.1 g/dL (ref 30.0–36.0)
MCV: 96.5 fL (ref 80.0–100.0)
Platelets: 504 10*3/uL — ABNORMAL HIGH (ref 150–400)
RBC: 3.72 MIL/uL — ABNORMAL LOW (ref 3.87–5.11)
RDW: 15.8 % — ABNORMAL HIGH (ref 11.5–15.5)
WBC: 9.5 10*3/uL (ref 4.0–10.5)
nRBC: 0 % (ref 0.0–0.2)

## 2018-12-14 LAB — TROPONIN I (HIGH SENSITIVITY)
Troponin I (High Sensitivity): 42 ng/L — ABNORMAL HIGH (ref <18)
Troponin I (High Sensitivity): 49 ng/L — ABNORMAL HIGH (ref <18)

## 2018-12-14 LAB — BASIC METABOLIC PANEL
Anion gap: 13 (ref 5–15)
BUN: 10 mg/dL (ref 8–23)
CO2: 32 mmol/L (ref 22–32)
Calcium: 7.8 mg/dL — ABNORMAL LOW (ref 8.9–10.3)
Chloride: 96 mmol/L — ABNORMAL LOW (ref 98–111)
Creatinine, Ser: 1.36 mg/dL — ABNORMAL HIGH (ref 0.44–1.00)
GFR calc Af Amer: 43 mL/min — ABNORMAL LOW (ref >60)
GFR calc non Af Amer: 37 mL/min — ABNORMAL LOW (ref >60)
Glucose, Bld: 147 mg/dL — ABNORMAL HIGH (ref 70–99)
Potassium: 3 mmol/L — ABNORMAL LOW (ref 3.5–5.1)
Sodium: 141 mmol/L (ref 135–145)

## 2018-12-14 LAB — BRAIN NATRIURETIC PEPTIDE: B Natriuretic Peptide: 279.8 pg/mL — ABNORMAL HIGH (ref 0.0–100.0)

## 2018-12-14 LAB — SARS CORONAVIRUS 2 (TAT 6-24 HRS): SARS Coronavirus 2: NEGATIVE

## 2018-12-14 LAB — MAGNESIUM: Magnesium: 2.1 mg/dL (ref 1.7–2.4)

## 2018-12-14 LAB — STREP PNEUMONIAE URINARY ANTIGEN: Strep Pneumo Urinary Antigen: NEGATIVE

## 2018-12-14 MED ORDER — SODIUM CHLORIDE 0.9 % IV SOLN
500.0000 mg | Freq: Once | INTRAVENOUS | Status: AC
  Administered 2018-12-14: 500 mg via INTRAVENOUS
  Filled 2018-12-14: qty 500

## 2018-12-14 MED ORDER — SODIUM CHLORIDE 0.9 % IV SOLN
2.0000 g | Freq: Once | INTRAVENOUS | Status: AC
  Administered 2018-12-14: 2 g via INTRAVENOUS
  Filled 2018-12-14: qty 20

## 2018-12-14 MED ORDER — ALPRAZOLAM 0.25 MG PO TABS
0.2500 mg | ORAL_TABLET | Freq: Two times a day (BID) | ORAL | Status: DC | PRN
  Administered 2018-12-15 – 2018-12-20 (×8): 0.5 mg via ORAL
  Filled 2018-12-14 (×8): qty 2

## 2018-12-14 MED ORDER — ALPRAZOLAM 0.25 MG PO TABS
0.5000 mg | ORAL_TABLET | Freq: Once | ORAL | Status: AC
  Administered 2018-12-14: 0.5 mg via ORAL
  Filled 2018-12-14: qty 2

## 2018-12-14 MED ORDER — ONDANSETRON HCL 4 MG/2ML IJ SOLN
4.0000 mg | Freq: Four times a day (QID) | INTRAMUSCULAR | Status: DC
  Administered 2018-12-15 – 2018-12-21 (×24): 4 mg via INTRAVENOUS
  Filled 2018-12-14 (×25): qty 2

## 2018-12-14 MED ORDER — SODIUM CHLORIDE 0.9% FLUSH
3.0000 mL | Freq: Once | INTRAVENOUS | Status: AC
  Administered 2018-12-14: 3 mL via INTRAVENOUS

## 2018-12-14 MED ORDER — SODIUM CHLORIDE 0.9 % IV SOLN
500.0000 mg | INTRAVENOUS | Status: AC
  Administered 2018-12-15 – 2018-12-19 (×5): 500 mg via INTRAVENOUS
  Filled 2018-12-14 (×5): qty 500

## 2018-12-14 MED ORDER — METOPROLOL TARTRATE 25 MG PO TABS
75.0000 mg | ORAL_TABLET | Freq: Two times a day (BID) | ORAL | Status: DC
  Administered 2018-12-14 – 2018-12-17 (×6): 75 mg via ORAL
  Filled 2018-12-14 (×6): qty 3

## 2018-12-14 MED ORDER — SODIUM CHLORIDE 0.9 % IV SOLN
2.0000 g | INTRAVENOUS | Status: AC
  Administered 2018-12-15 – 2018-12-19 (×5): 2 g via INTRAVENOUS
  Filled 2018-12-14: qty 20
  Filled 2018-12-14 (×3): qty 2
  Filled 2018-12-14: qty 20

## 2018-12-14 MED ORDER — FUROSEMIDE 40 MG PO TABS
40.0000 mg | ORAL_TABLET | Freq: Every day | ORAL | Status: DC
  Administered 2018-12-15: 40 mg via ORAL
  Filled 2018-12-14: qty 1

## 2018-12-14 MED ORDER — ABEMACICLIB 50 MG PO TABS
50.0000 mg | ORAL_TABLET | Freq: Two times a day (BID) | ORAL | Status: DC
  Administered 2018-12-14: 50 mg via ORAL
  Filled 2018-12-14 (×3): qty 1

## 2018-12-14 MED ORDER — VITAMIN D (ERGOCALCIFEROL) 1.25 MG (50000 UNIT) PO CAPS
50000.0000 [IU] | ORAL_CAPSULE | ORAL | Status: DC
  Administered 2018-12-16: 50000 [IU] via ORAL
  Filled 2018-12-14 (×2): qty 1

## 2018-12-14 MED ORDER — APIXABAN 5 MG PO TABS
5.0000 mg | ORAL_TABLET | Freq: Two times a day (BID) | ORAL | Status: DC
  Administered 2018-12-14 – 2018-12-21 (×14): 5 mg via ORAL
  Filled 2018-12-14 (×14): qty 1

## 2018-12-14 MED ORDER — ACETAMINOPHEN 325 MG PO TABS
650.0000 mg | ORAL_TABLET | Freq: Four times a day (QID) | ORAL | Status: DC | PRN
  Administered 2018-12-16 – 2018-12-19 (×5): 650 mg via ORAL
  Filled 2018-12-14 (×7): qty 2

## 2018-12-14 MED ORDER — IOHEXOL 350 MG/ML SOLN
75.0000 mL | Freq: Once | INTRAVENOUS | Status: AC | PRN
  Administered 2018-12-14: 75 mL via INTRAVENOUS

## 2018-12-14 MED ORDER — LOPERAMIDE HCL 2 MG PO CAPS
2.0000 mg | ORAL_CAPSULE | ORAL | Status: DC | PRN
  Administered 2018-12-14 – 2018-12-20 (×4): 2 mg via ORAL
  Filled 2018-12-14 (×5): qty 1

## 2018-12-14 NOTE — ED Notes (Signed)
Pt ambulated on the hallway, oxygen stayed on 98-96% no signs of hypoxia, but pt still felt weak and short of breath

## 2018-12-14 NOTE — Progress Notes (Signed)
   Vital Signs MEWS/VS Documentation      12/14/2018 1915 12/14/2018 1930 12/14/2018 1945 12/14/2018 2102   MEWS Score:  2  2  0  2   MEWS Score Color:  Yellow  Yellow  Green  Yellow   Resp:  (!) 32  (!) 33  20  (!) 26   Pulse:  80  83  81  83   BP:  140/75  118/79  (!) 137/93  130/67   Temp:  -  -  -  98.2 F (36.8 C)   O2 Device:  -  -  -  Nasal Cannula       Will continue to monitor respiratory status. Not a change for patient.    Tristan Schroeder 12/14/2018,9:46 PM

## 2018-12-14 NOTE — ED Notes (Signed)
ED TO INPATIENT HANDOFF REPORT  ED Nurse Name and Phone #: Nolene Bernheim Name/Age/Gender Beverly Tucker 78 y.o. female Room/Bed: 031C/031C  Code Status   Code Status: Prior  Home/SNF/Other Home Patient oriented to: self, place, time and situation Is this baseline? Yes   Triage Complete: Triage complete  Chief Complaint SOB,tachy  Triage Note Pt to triage via [facility]> Sudden onset SOB and tachycardia at 8am after taking her medications.  Per EMS, BP 90/40 NS 500c bolus and repeat  BP 136/-.  Hx of CHF.  HR 140 during transported and converted to 80 SR on arrival to ED.  Pt wears 2 liters O2 at home.  Pt with coughing and dry heaves that started on arrival to triage.  Reports belching over the last 4 days.   Allergies No Known Allergies  Level of Care/Admitting Diagnosis ED Disposition    ED Disposition Condition [facility] Area: [place] [100100]  Level of Care: Telemetry Cardiac [103]  Covid Evaluation: Asymptomatic Screening Protocol (No Symptoms)  Diagnosis: Shortness of breath [786.05.ICD-9-CM]  Admitting Physician: Clance Boll [id number]  Attending Physician: Clance Boll [id number]  Estimated length of stay: 3 - 4 days  Certification:: I certify this patient will need inpatient services for at least 2 midnights  PT Class (Do Not Modify): Inpatient [101]  PT Acc Code (Do Not Modify): Private [1]       B Medical/Surgery History Past Medical History:  Diagnosis Date  . Acute CHF (congestive heart failure) ([place]) 10/2018  . Atrial fibrillation ([place])   . Cancer (HCC)    Breast   . GERD (gastroesophageal reflux disease)   . Hypertension    Past Surgical History:  Procedure Laterality Date  . CHOLECYSTECTOMY    . IR PERC PLEURAL DRAIN W/INDWELL CATH W/IMG GUIDE  11/08/2018  . IR THORACENTESIS ASP PLEURAL SPACE W/IMG GUIDE  10/22/2018     A IV Location/Drains/Wounds Patient Lines/Drains/Airways Status   Active  Line/Drains/Airways    Name:   Placement date:   Placement time:   Site:   Days:   Peripheral IV 12/14/18 Right Antecubital   12/14/18    1500    Antecubital   less than 1   Closed System Drain Left;Lateral Chest Other (Comment) 15 Fr.   11/08/18    0848    Chest   36   External Urinary Catheter   11/16/18    1800    -   28   Wound / Incision (Open or Dehisced) 10/14/18 Other (Comment) Breast Right   10/14/18    1420    Breast   61          Intake/Output Last 24 hours No intake or output data in the 24 hours ending 12/14/18 1923  Labs/Imaging Results for orders placed or performed during the hospital encounter of 12/14/18 (from the past 48 hour(s))  Basic metabolic panel     Status: Abnormal   Collection Time: 12/14/18  1:25 PM  Result Value Ref Range   Sodium 141 135 - 145 mmol/L   Potassium 3.0 (L) 3.5 - 5.1 mmol/L   Chloride 96 (L) 98 - 111 mmol/L   CO2 32 22 - 32 mmol/L   Glucose, Bld 147 (H) 70 - 99 mg/dL   BUN 10 8 - 23 mg/dL   Creatinine, Ser 1.36 (H) 0.44 - 1.00 mg/dL   Calcium 7.8 (L) 8.9 - 10.3 mg/dL   GFR calc non  Af Amer 37 (L) >60 mL/min   GFR calc Af Amer 43 (L) >60 mL/min   Anion gap 13 5 - 15    Comment: Performed at [facility] [address], [place], [place] [address]  CBC     Status: Abnormal   Collection Time: 12/14/18  1:25 PM  Result Value Ref Range   WBC 9.5 4.0 - 10.5 K/uL   RBC 3.72 (L) 3.87 - 5.11 MIL/uL   Hemoglobin 10.8 (L) 12.0 - 15.0 g/dL   HCT 35.9 (L) 36.0 - 46.0 %   MCV 96.5 80.0 - 100.0 fL   MCH 29.0 26.0 - 34.0 pg   MCHC 30.1 30.0 - 36.0 g/dL   RDW 15.8 (H) 11.5 - 15.5 %   Platelets 504 (H) 150 - 400 K/uL   nRBC 0.0 0.0 - 0.2 %    Comment: Performed at [facility] [address], [place], [place] [address]  Brain natriuretic peptide     Status: Abnormal   Collection Time: 12/14/18  1:25 PM  Result Value Ref Range   B Natriuretic Peptide 279.8 (H) 0.0 - 100.0 pg/mL    Comment: Performed at [facility] [address], [place], [place] [address]  Troponin I (High Sensitivity)     Status: Abnormal   Collection Time: 12/14/18  1:25 PM  Result Value Ref Range   Troponin I (High Sensitivity) 42 (H) <18 ng/L    Comment: (NOTE) Elevated high sensitivity troponin I (hsTnI) values and significant  changes across serial measurements may suggest ACS but many other  chronic and acute conditions are known to elevate hsTnI results.  Refer to the "Links" section for chest pain algorithms and additional  guidance. Performed at [facility], [place] [address] [place], [place] [address]   Magnesium     Status: None   Collection Time: 12/14/18  1:25 PM  Result Value Ref Range   Magnesium 2.1 1.7 - 2.4 mg/dL    Comment: Performed at [facility] [address] [place], [place] [address]  Troponin I (High Sensitivity)     Status: Abnormal   Collection Time: 12/14/18  3:42 PM  Result Value Ref Range   Troponin I (High Sensitivity) 49 (H) <18 ng/L    Comment: (NOTE) Elevated high sensitivity troponin I (hsTnI) values and significant  changes across serial measurements may suggest ACS but many other  chronic and acute conditions are known to elevate hsTnI results.  Refer to the "Links" section for chest pain algorithms and additional  guidance. Performed at [facility], [place] [address], [place], [place] [address]    Dg Chest 2 View  Result Date: 12/14/2018 CLINICAL DATA:  Shortness of breath EXAM: CHEST - 2 VIEW COMPARISON:  11/19/2018 FINDINGS: Cardiomegaly. Unchanged small bilateral pleural effusions and associated atelectasis or consolidation with a left-sided tunneled pleural drainage catheter. No new airspace opacity. The visualized skeletal structures are unremarkable. IMPRESSION: 1. Unchanged small bilateral pleural effusions and associated atelectasis or consolidation with a left-sided tunneled pleural drainage catheter. No new airspace opacity. 2.  Cardiomegaly. Electronically  Signed   By: Eddie Candle M.D.   On: 12/14/2018 14:28   Cta Chest For Pe  Result Date: 12/14/2018 CLINICAL DATA:  Dyspnea, recent hospitalization. History of adenocarcinoma and malignant effusion. EXAM: CT ANGIOGRAPHY CHEST WITH CONTRAST TECHNIQUE: Multidetector CT imaging of the chest was performed using the standard protocol during bolus administration of intravenous contrast. Multiplanar  CT image reconstructions and MIPs were obtained to evaluate the vascular anatomy. CONTRAST:  59mL OMNIPAQUE IOHEXOL 350 MG/ML SOLN COMPARISON:  Multiple priors including CT from 10/16/2018 and chest x-rays from October and today. FINDINGS: Cardiovascular: Heart size is stable with signs of right heart enlargement and engorgement of central pulmonary vasculature. Main pulmonary artery at 3.5 cm. No signs of pulmonary embolism. Basilar vascular assessment limited by respiratory motion. Aorta is of normal caliber. Mediastinum/Nodes: No signs of adenopathy in the chest. Lungs/Pleura: Interval placement of a PleurX catheter in the left posterior chest. Loculated, known malignant effusion in the left chest with increased fissural component compared to previous study but reduced posterior component. Overall volume of fluid is similar compared to previous exam. Increased pleural fluid however in the right chest compared to the prior study. Patchy areas of nodularity in the right mid chest. No signs of pneumothorax. Upper Abdomen: Incidental imaging of upper abdominal contents shows no acute abnormality. Musculoskeletal: Known fungating mass associated with the right breast is redemonstrated. Thickness and nodularity may be diminished compared to the prior study measuring approximately 1 cm in greatest thickness where there are areas that previously measured 1.4-1.5 cm. The significance is uncertain. There is an obvious soft tissue defect in this location. Multifocal areas of bony sclerosis and scattered areas of lytic change period.  Sclerotic lesions scattered about the thoracic spine compatible with metastatic disease. Example of lytic in subtly expansile lesions in the ribs best seen on sagittal image 24 of series 9 in the right lateral fourth rib. There also subacute fractures in inferior ribs along the right chest. Review of the MIP images confirms the above findings. IMPRESSION: 1. Patchy areas of nodularity in the right chest and some basilar consolidative changes on the right raising the question of superimposed pneumonia. 2. No pulmonary embolism identified. 3. Interval placement of a PleurX catheter in the left posterior chest. Overall volume of fluid in the left chest similar compared to the prior study. 4. Pleural fluid in the right chest compared to the prior study. 5. Known fungating mass associated with the right breast is redemonstrated. Thickness and nodularity may be slightly diminished. 6. Bony metastatic disease as above. Aortic Atherosclerosis (ICD10-I70.0). Electronically Signed   By: Zetta Bills M.D.   On: 12/14/2018 17:26    Pending Labs Unresulted Labs (From admission, onward)    Start     Ordered   12/14/18 1747  SARS CORONAVIRUS 2 (TAT 6-24 HRS) Nasopharyngeal Nasopharyngeal Swab  (Asymptomatic/Tier 2 Patients Labs)  Once,   STAT    Question Answer Comment  Is this test for diagnosis or screening Screening   Symptomatic for COVID-19 as defined by CDC No   Hospitalized for COVID-19 No   Admitted to ICU for COVID-19 No   Previously tested for COVID-19 Yes   Resident in a congregate (group) care setting Unknown   Employed in healthcare setting Unknown   Pregnant No      12/14/18 1746          Vitals/Pain Today's Vitals   12/14/18 1800 12/14/18 1830 12/14/18 1845 12/14/18 1900  BP: (!) 175/87 125/68 112/81 135/66  Pulse: 88 79 80 80  Resp: (!) 28 (!) 21 (!) 29 20  Temp:      TempSrc:      SpO2: 95% 99% 100% 97%  PainSc:        Isolation Precautions No active  isolations  Medications Medications  sodium chloride flush (NS) 0.9 % injection 3 mL (  has no administration in time range)  azithromycin (ZITHROMAX) 500 mg in sodium chloride 0.9 % 250 mL IVPB (has no administration in time range)  ALPRAZolam (XANAX) tablet 0.5 mg (0.5 mg Oral Given 12/14/18 1514)  iohexol (OMNIPAQUE) 350 MG/ML injection 75 mL (75 mLs Intravenous Contrast Given 12/14/18 1639)  cefTRIAXone (ROCEPHIN) 2 g in sodium chloride 0.9 % 100 mL IVPB (0 g Intravenous Stopped 12/14/18 1923)    Mobility walks with person assist Low fall risk   Focused Assessments , Pulmonary Assessment Handoff:  Lung sounds:   O2 Device: Nasal Cannula O2 Flow Rate (L/min): 3 L/min      R Recommendations: See Admitting Provider Note  Report given to:   Additional Notes:

## 2018-12-14 NOTE — Progress Notes (Signed)
Patient arrived to unit, presents with right breast wound that needs dressing change. Also on left side lateral chest, patient has a dressing to cover tubing for PleurX catheter which is drained 2x's week. Patient states it needs to be drained this week.  Cardiac monitoring initiated and verified, box #5.

## 2018-12-14 NOTE — ED Provider Notes (Signed)
  Provider Note MRN:  [id number]  Arrival date & time: 12/14/18    ED Course and Medical Decision Making  Assumed care from Dr. Roslynn Amble at shift change.  History of SVT, question of recurrence today, now in sinus rhythm but continued dyspnea, question of CHF versus anxiety.  Attempting ambulation and will reassess.  No hypoxia during ambulation assessment but patient very dyspneic.  CTA obtained showing no PE but evidence of pneumonia.  Patient also with rising troponins, will admit to hospitalist service.  Procedures  Final Clinical Impressions(s) / ED Diagnoses     ICD-10-CM   1. Palpitations  R00.2   2. Shortness of breath  R06.02   3. Dyspnea, unspecified type  R06.00   4. Community acquired pneumonia, unspecified laterality  J18.9     ED Discharge Orders    None      Discharge Instructions   None     Barth Kirks. [place] Small, [place] [email or web address]    Maudie Flakes, MD 12/14/18 [phone]

## 2018-12-14 NOTE — H&P (Signed)
History and Physical    PATRCIA Tucker [id number] DOB: 1940/04/26 DOA: 12/14/2018  PCP: Beverly Sacramento, MD  Patient coming from: Home  I have personally briefly reviewed patient's old medical records in [place]  Chief Complaint: Shortness of breath  HPI: Beverly Tucker is a 78 y.o. 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 Of note patient does have interim history of admission to the hospital and recent discharge on 11/24/2018.  On that admission patient had been admitted for uncontrolled SVT as well as acute exacerbation of her diastolic heart failure.  While she was in the ER she was treated with adenosine and had converted to sinus without any further difficulties with rate control while admitted.  She was treated with IV diuresis for her heart failure. Patient now  Presents three weeks s/p discharge to the ED with increasing shortness of breath and palpitations which has been intermittent over the last 48hours.  Patient stated that her symptoms began to worsen around 8AM day of presentation. He states by late morning early afternoon her symptoms were unbearable and at that time she called EMS.  Per EMS on evaluation patient was in SVT at 140s 140s with a blood pressure of  90/40.Patient at the time was treated with 500 cc of normal saline.  While in route patient converted to normal sinus at 80.  Of note per patient states that she had no associated chest pain or diaphoresis with these symptoms.  She also denied any fever, chills, nausea vomiting, presyncope, or abdominal pain.  However she did endorse mild cough productive of clear sputum as well as symptoms of  sinus drainage.  She also denies any sick contacts.  She states prior to these last 48 hours she had felt  her usual self.  ED Course:  On arrival in the ED vitals as follows: Blood pressure 147/78, pulse 84, temperature 98.6, respiratory rate 26, sats 100%  Pertinent labs/imaging: White count 9.5, hemoglobin 10.8 at baseline, creatinine 1.36 at baseline.  Potassium 3.0, troponin elevated at 49 review of prior data notes chronic elevation.  BNP noted to be 279   CT chest: Patchy areas of nodularity in the right chest and some basilar consolidative changes on the right with concern for superimposed pneumonia.  Pleural effusion unchanged  Patient diagnosed with with HCAP and was started on broad-spectrum antibiotics and slated for admission to hospitalist service    Review of Systems: As per HPI otherwise 10 point review of systems negative.   Past Medical History:  Diagnosis Date   Acute CHF (congestive heart failure) ([place]) 10/2018   Atrial fibrillation (HCC)    Cancer (HCC)    Breast    GERD (gastroesophageal reflux disease)    Hypertension     Past Surgical History:  Procedure Laterality Date   CHOLECYSTECTOMY     IR PERC PLEURAL DRAIN W/INDWELL CATH W/IMG GUIDE  11/08/2018   IR THORACENTESIS ASP PLEURAL SPACE W/IMG GUIDE  10/22/2018     reports that she has never smoked. She has never used smokeless tobacco. She reports that she does not drink alcohol or use drugs.  No Known Allergies  Family History  Problem Relation Age of Onset   Diabetes Mellitus II Other        son    Prior to  Admission medications   Medication Sig Start Date End Date Taking? Authorizing Provider  abemaciclib (VERZENIO) 50 MG tablet Take 1 tablet (50 mg total) by mouth 2 (two) times daily. Swallow tablets whole. Do not chew, crush, or split tablets before swallowing. 12/09/18  Yes Beverly Genera, MD  ALPRAZolam Duanne Moron) 0.5 MG tablet Take 0.25-0.5 mg by mouth 2 (two) times daily as needed for anxiety. 05/31/17  Yes [provider]  apixaban (ELIQUIS) 5 MG TABS tablet Take 1 tablet (5  mg total) by mouth 2 (two) times daily. 10/23/18  Yes Beverly Hubert, MD  ergocalciferol (VITAMIN D2) 1.25 MG (50000 UT) capsule Take 1 capsule (50,000 Units total) by mouth once a week. 11/04/18  Yes Beverly Genera, MD  furosemide (LASIX) 40 MG tablet Take 1 tablet (40 mg total) by mouth daily. 11/23/18  Yes Beverly Phi, DO  loperamide (IMODIUM) 2 MG capsule Take 1 capsule (2 mg total) by mouth as needed for diarrhea or loose stools. 11/24/18  Yes Beverly Phi, DO  metoprolol tartrate 75 MG TABS Take 75 mg by mouth 2 (two) times daily. 11/22/18 12/22/18 Yes Beverly Phi, DO  ondansetron (ZOFRAN ODT) 4 MG disintegrating tablet Take 1 tablet (4 mg total) by mouth every 8 (eight) hours as needed for nausea or vomiting. 11/24/18  Yes Beverly Phi, DO  letrozole [facility]) 2.5 MG tablet Take 1 tablet (2.5 mg total) by mouth daily. Patient not taking: Reported on 12/14/2018 12/09/18   Beverly Genera, MD    Physical Exam: Vitals:   12/14/18 1930 12/14/18 1945 12/14/18 2048 12/14/18 2102  BP: 118/79 (!) 137/93  130/67  Pulse: 83 81  83  Resp: (!) 33 20  (!) 26  Temp:    98.2 F (36.8 C)  TempSrc:    Oral  SpO2: 100% 99%  99%  Weight:   79.5 kg   Height:   5\' 6"  (1.676 m)     Constitutional: NAD, calm, comfortable Vitals:   12/14/18 1930 12/14/18 1945 12/14/18 2048 12/14/18 2102  BP: 118/79 (!) 137/93  130/67  Pulse: 83 81  83  Resp: (!) 33 20  (!) 26  Temp:    98.2 F (36.8 C)  TempSrc:    Oral  SpO2: 100% 99%  99%  Weight:   79.5 kg   Height:   5\' 6"  (1.676 m)    Eyes: PERRL, lids and conjunctivae normal ENMT: Mucous membranes are moist. Posterior pharynx clear of any exudate or lesions.Normal dentition.  Neck: normal, supple, no masses, no thyromegaly Respiratory: clear to auscultation bilaterally, no wheezing, no crackles/decrease bases. Normal respiratory effort. No accessory muscle use.  Cardiovascular: Regular rate and rhythm, no murmurs / rubs / gallops. No  extremity edema. 2+ pedal pulses. No carotid bruits.  Abdomen: no tenderness, no masses palpated. No hepatosplenomegaly. Bowel sounds positive.  Musculoskeletal: no clubbing / cyanosis. No joint deformity upper and lower extremities. Good ROM, no contractures. Normal muscle tone.  Skin: right breast open lesion, dressing afixed. Neurologic: CN 2-12 grossly intact. Sensation intact, DTR normal. Strength 5/5 in all 4.  Psychiatric: Normal judgment and insight. Alert and oriented x 3. Normal mood.    Labs on Admission: I have personally reviewed following labs and imaging studies  CBC: Recent Labs  Lab 12/14/18 1325  WBC 9.5  HGB 10.8*  HCT 35.9*  MCV 96.5  PLT [id number]*   Basic Metabolic Panel: Recent Labs  Lab 12/14/18 1325  NA 141  K 3.0*  CL  96*  CO2 32  GLUCOSE 147*  BUN 10  CREATININE 1.36*  CALCIUM 7.8*  MG 2.1   GFR: Estimated Creatinine Clearance: 35.7 mL/min (A) (by C-G formula based on SCr of 1.36 mg/dL (H)). Liver Function Tests: No results for input(s): AST, ALT, ALKPHOS, BILITOT, PROT, ALBUMIN in the last 168 hours. No results for input(s): LIPASE, AMYLASE in the last 168 hours. No results for input(s): AMMONIA in the last 168 hours. Coagulation Profile: No results for input(s): INR, PROTIME in the last 168 hours. Cardiac Enzymes: No results for input(s): CKTOTAL, CKMB, CKMBINDEX, TROPONINI in the last 168 hours. BNP (last 3 results) No results for input(s): PROBNP in the last 8760 hours. HbA1C: No results for input(s): HGBA1C in the last 72 hours. CBG: No results for input(s): GLUCAP in the last 168 hours. Lipid Profile: No results for input(s): CHOL, HDL, LDLCALC, TRIG, CHOLHDL, LDLDIRECT in the last 72 hours. Thyroid Function Tests: No results for input(s): TSH, T4TOTAL, FREET4, T3FREE, THYROIDAB in the last 72 hours. Anemia Panel: No results for input(s): VITAMINB12, FOLATE, FERRITIN, TIBC, IRON, RETICCTPCT in the last 72 hours. Urine analysis:      Component Value Date/Time   COLORURINE YELLOW 12/14/2018 2206   APPEARANCEUR HAZY (A) 12/14/2018 2206   LABSPEC >1.046 (H) 12/14/2018 2206   PHURINE 6.0 12/14/2018 2206   GLUCOSEU NEGATIVE 12/14/2018 2206   HGBUR SMALL (A) 12/14/2018 2206   BILIRUBINUR NEGATIVE 12/14/2018 2206   KETONESUR 5 (A) 12/14/2018 2206   PROTEINUR 100 (A) 12/14/2018 2206   NITRITE NEGATIVE 12/14/2018 2206   LEUKOCYTESUR LARGE (A) 12/14/2018 2206    Radiological Exams on Admission: Dg Chest 2 View  Result Date: 12/14/2018 CLINICAL DATA:  Shortness of breath EXAM: CHEST - 2 VIEW COMPARISON:  11/19/2018 FINDINGS: Cardiomegaly. Unchanged small bilateral pleural effusions and associated atelectasis or consolidation with a left-sided tunneled pleural drainage catheter. No new airspace opacity. The visualized skeletal structures are unremarkable. IMPRESSION: 1. Unchanged small bilateral pleural effusions and associated atelectasis or consolidation with a left-sided tunneled pleural drainage catheter. No new airspace opacity. 2.  Cardiomegaly. Electronically Signed   By: Eddie Candle M.D.   On: 12/14/2018 14:28   Cta Chest For Pe  Result Date: 12/14/2018 CLINICAL DATA:  Dyspnea, recent hospitalization. History of adenocarcinoma and malignant effusion. EXAM: CT ANGIOGRAPHY CHEST WITH CONTRAST TECHNIQUE: Multidetector CT imaging of the chest was performed using the standard protocol during bolus administration of intravenous contrast. Multiplanar CT image reconstructions and MIPs were obtained to evaluate the vascular anatomy. CONTRAST:  8mL OMNIPAQUE IOHEXOL 350 MG/ML SOLN COMPARISON:  Multiple priors including CT from 10/16/2018 and chest x-rays from October and today. FINDINGS: Cardiovascular: Heart size is stable with signs of right heart enlargement and engorgement of central pulmonary vasculature. Main pulmonary artery at 3.5 cm. No signs of pulmonary embolism. Basilar vascular assessment limited by respiratory motion.  Aorta is of normal caliber. Mediastinum/Nodes: No signs of adenopathy in the chest. Lungs/Pleura: Interval placement of a PleurX catheter in the left posterior chest. Loculated, known malignant effusion in the left chest with increased fissural component compared to previous study but reduced posterior component. Overall volume of fluid is similar compared to previous exam. Increased pleural fluid however in the right chest compared to the prior study. Patchy areas of nodularity in the right mid chest. No signs of pneumothorax. Upper Abdomen: Incidental imaging of upper abdominal contents shows no acute abnormality. Musculoskeletal: Known fungating mass associated with the right breast is redemonstrated. Thickness and nodularity may  be diminished compared to the prior study measuring approximately 1 cm in greatest thickness where there are areas that previously measured 1.4-1.5 cm. The significance is uncertain. There is an obvious soft tissue defect in this location. Multifocal areas of bony sclerosis and scattered areas of lytic change period. Sclerotic lesions scattered about the thoracic spine compatible with metastatic disease. Example of lytic in subtly expansile lesions in the ribs best seen on sagittal image 24 of series 9 in the right lateral fourth rib. There also subacute fractures in inferior ribs along the right chest. Review of the MIP images confirms the above findings. IMPRESSION: 1. Patchy areas of nodularity in the right chest and some basilar consolidative changes on the right raising the question of superimposed pneumonia. 2. No pulmonary embolism identified. 3. Interval placement of a PleurX catheter in the left posterior chest. Overall volume of fluid in the left chest similar compared to the prior study. 4. Pleural fluid in the right chest compared to the prior study. 5. Known fungating mass associated with the right breast is redemonstrated. Thickness and nodularity may be slightly  diminished. 6. Bony metastatic disease as above. Aortic Atherosclerosis (ICD10-I70.0). Electronically Signed   By: Zetta Bills M.D.   On: 12/14/2018 17:26    EKG: Independently reviewed. 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  Who now returns with increasing shortness of breath and palpitations found to has SVT in route as well as hypotension patient was treated with IV bolus of 500 cc and in route converted back to sinus.  On evaluation in the ED patient was noted to have increased respiratory rate but stable vitals no noted hypoxemia however due to continued increased respiratory rate patient had CT scan completed which noted findings of pneumonia.  Healthcare associated pneumonia -Started on broad-spectrum antibiotics per hospital protocol -Blood culture data -Continue with pulmonary toilet -Supportive care with O2  SVT/history of atrial fibrillation full dose anticoag -Currently in sinus at this time -Resume home medications of apixaban and metoprolol  Elevated troponin troponin due to demand from tachycardia -Cycle troponins -Repeat echocardiogram  History of metastatic breast cancer -Hold chemotherapy for now -Resume when patient has stable status  Diastolic heart failure -No current exacerbation at this time -Patient appears more volume down -Patient was hypotensive on admit -At this time will continue with outpatient diuretics as blood pressure allows  Hypertension -Episode of hypotension, currently within normal at this time -We will resume beta-blocker and attempt to maintain rate control as long as blood pressure tolerates  GERD -PPI   DVT prophylaxis: Apixaban Code Status:  Full code Family Communication: Case discussed with son who is at bedside  disposition Plan: Patient is expected to be admitted more than 2 midnights  consults called: Consults none /can consider cardiology consult if rate becomes uncontrolled/or troponins trend up admission status: Inpatient  Clance Boll MD [facility] Pager [phone]  If 7PM-7AM, please contact night-coverage [email or web address] Password [email or web address]  12/14/2018, 10:57 PM

## 2018-12-14 NOTE — ED Provider Notes (Signed)
[facility] EMERGENCY DEPARTMENT Provider Note   CSN: [id number] Arrival date & time: 12/14/18  1312     History   Chief Complaint Chief Complaint  Patient presents with  . Shortness of Breath  . Tachycardia    HPI GELENE POSTLE is a 78 y.o. female.  Past medical history of heart failure, A. fib, breast cancer presents to ER with shortness of breath.  Patient states she woke up this morning and felt short of breath.  Also having palpitations.  EMS noted heart rate to be up to 140 during transport, spontaneously converted to sinus rhythm in the 80s.  Patient denies any associated chest pain.  States that she has continued sensation of shortness of breath.  No associated weight gain, no leg swelling.  No cough or fevers.  States she wears 2 L oxygen at home.     HPI  Past Medical History:  Diagnosis Date  . Acute CHF (congestive heart failure) ([place]) 10/2018  . Atrial fibrillation ([place])   . Cancer (HCC)    Breast   . GERD (gastroesophageal reflux disease)   . Hypertension     Patient Active Problem List   Diagnosis Date Noted  . Malnutrition of moderate degree 11/18/2018  . SVT (supraventricular tachycardia) ([place]) 11/16/2018  . Acute on chronic diastolic CHF (congestive heart failure) ([place]) 11/16/2018  . Elevated troponin 11/16/2018  . Leukocytosis 11/16/2018  . Atrial fibrillation, chronic ([place]) 11/16/2018  . Malignant pleural effusion   . 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 11/06/2018  . AKI (acute kidney injury) ([place]) 11/06/2018  . AF (paroxysmal atrial fibrillation) ([place]) 11/06/2018  . Pleural effusion 11/06/2018  . Bone metastases ([place]) 11/04/2018  . Metastatic breast cancer ([place])   . Pneumothorax   . Gram-positive bacteremia   . Mass of breast, right 10/15/2018  . Acute CHF (congestive heart failure) ([place]) 10/15/2018  . Shortness of breath 10/14/2018  . CKD (chronic kidney disease) stage 3, GFR 30-59 ml/min 02/06/2018  . HTN (hypertension)  12/01/2015    Past Surgical History:  Procedure Laterality Date  . CHOLECYSTECTOMY    . IR PERC PLEURAL DRAIN W/INDWELL CATH W/IMG GUIDE  11/08/2018  . IR THORACENTESIS ASP PLEURAL SPACE W/IMG GUIDE  10/22/2018     OB History   No obstetric history on file.      Home Medications    Prior to Admission medications   Medication Sig Start Date End Date Taking? Authorizing Provider  abemaciclib (VERZENIO) 50 MG tablet Take 1 tablet (50 mg total) by mouth 2 (two) times daily. Swallow tablets whole. Do not chew, crush, or split tablets before swallowing. 12/09/18  Yes Brunetta Genera, MD  ALPRAZolam Duanne Moron) 0.5 MG tablet Take 0.25-0.5 mg by mouth 2 (two) times daily as needed for anxiety. 05/31/17  Yes [provider]  apixaban (ELIQUIS) 5 MG TABS tablet Take 1 tablet (5 mg total) by mouth 2 (two) times daily. 10/23/18  Yes Jeanmarie Hubert, MD  ergocalciferol (VITAMIN D2) 1.25 MG (50000 UT) capsule Take 1 capsule (50,000 Units total) by mouth once a week. 11/04/18  Yes Brunetta Genera, MD  furosemide (LASIX) 40 MG tablet Take 1 tablet (40 mg total) by mouth daily. 11/23/18  Yes Dessa Phi, DO  loperamide (IMODIUM) 2 MG capsule Take 1 capsule (2 mg total) by mouth as needed for diarrhea or loose stools. 11/24/18  Yes Dessa Phi, DO  metoprolol tartrate 75 MG TABS Take 75 mg by mouth 2 (two) times daily.  11/22/18 12/22/18 Yes Dessa Phi, DO  ondansetron (ZOFRAN ODT) 4 MG disintegrating tablet Take 1 tablet (4 mg total) by mouth every 8 (eight) hours as needed for nausea or vomiting. 11/24/18  Yes Dessa Phi, DO  letrozole [facility]) 2.5 MG tablet Take 1 tablet (2.5 mg total) by mouth daily. Patient not taking: Reported on 12/14/2018 12/09/18   Brunetta Genera, MD    Family History Family History  Problem Relation Age of Onset  . Diabetes Mellitus II Other        son    Social History Social History   Tobacco Use  . Smoking status: Never Smoker  .  Smokeless tobacco: Never Used  Substance Use Topics  . Alcohol use: Never    Frequency: Never  . Drug use: Never     Allergies   Patient has no known allergies.   Review of Systems Review of Systems   Physical Exam Updated Vital Signs BP (!) 147/78 (BP Location: Right Arm)   Pulse 84   Temp 98.6 F (37 C) (Oral)   Resp (!) 26   SpO2 100%   Physical Exam Vitals signs and nursing note reviewed.  Constitutional:      General: She is not in acute distress.    Appearance: She is well-developed.  HENT:     Head: Normocephalic and atraumatic.  Eyes:     Conjunctiva/sclera: Conjunctivae normal.  Neck:     Musculoskeletal: Neck supple.  Cardiovascular:     Rate and Rhythm: Normal rate and regular rhythm.     Heart sounds: No murmur.  Pulmonary:     Effort: Tachypnea present. No respiratory distress.     Breath sounds: Normal breath sounds. No wheezing or rales.  Abdominal:     Palpations: Abdomen is soft.     Tenderness: There is no abdominal tenderness.  Musculoskeletal:     Right lower leg: No edema.     Left lower leg: No edema.  Skin:    General: Skin is warm and dry.     Capillary Refill: Capillary refill takes less than 2 seconds.  Neurological:     General: No focal deficit present.     Mental Status: She is alert.  Psychiatric:        Mood and Affect: Mood is anxious.        Behavior: Behavior normal.      ED Treatments / Results  Labs (all labs ordered are listed, but only abnormal results are displayed) Labs Reviewed  BASIC METABOLIC PANEL - Abnormal; Notable for the following components:      Result Value   Potassium 3.0 (*)    Chloride 96 (*)    Glucose, Bld 147 (*)    Creatinine, Ser 1.36 (*)    Calcium 7.8 (*)    GFR calc non Af Amer 37 (*)    GFR calc Af Amer 43 (*)    All other components within normal limits  CBC - Abnormal; Notable for the following components:   RBC 3.72 (*)    Hemoglobin 10.8 (*)    HCT 35.9 (*)    RDW 15.8 (*)     Platelets 504 (*)    All other components within normal limits  BRAIN NATRIURETIC PEPTIDE - Abnormal; Notable for the following components:   B Natriuretic Peptide 279.8 (*)    All other components within normal limits  TROPONIN I (HIGH SENSITIVITY) - Abnormal; Notable for the following components:   Troponin I (High Sensitivity)  42 (*)    All other components within normal limits  MAGNESIUM  TROPONIN I (HIGH SENSITIVITY)    EKG EKG Interpretation  Date/Time:  Saturday December 14 2018 13:23:51 EST Ventricular Rate:  83 PR Interval:  216 QRS Duration: 158 QT Interval:  408 QTC Calculation: 479 R Axis:   -8 Text Interpretation:  Poor data quality, interpretation may be adversely affected Sinus rhythm with 1st degree A-V block with occasional Premature ventricular complexes Non-specific intra-ventricular conduction block Minimal voltage criteria for LVH, may be normal variant ( Cornell product ) Abnormal ECG Confirmed by Madalyn Rob [phone]) on 12/14/2018 1:31:07 PM   Radiology Dg Chest 2 View  Result Date: 12/14/2018 CLINICAL DATA:  Shortness of breath EXAM: CHEST - 2 VIEW COMPARISON:  11/19/2018 FINDINGS: Cardiomegaly. Unchanged small bilateral pleural effusions and associated atelectasis or consolidation with a left-sided tunneled pleural drainage catheter. No new airspace opacity. The visualized skeletal structures are unremarkable. IMPRESSION: 1. Unchanged small bilateral pleural effusions and associated atelectasis or consolidation with a left-sided tunneled pleural drainage catheter. No new airspace opacity. 2.  Cardiomegaly. Electronically Signed   By: Eddie Candle M.D.   On: 12/14/2018 14:28    Procedures Procedures (including critical care time)  Medications Ordered in ED Medications  sodium chloride flush (NS) 0.9 % injection 3 mL (has no administration in time range)  ALPRAZolam (XANAX) tablet 0.5 mg (0.5 mg Oral Given 12/14/18 1514)     Initial Impression  / Assessment and Plan / ED Course  I have reviewed the triage vital signs and the nursing notes.  Pertinent labs & imaging results that were available during my care of the patient were reviewed by me and considered in my medical decision making (see chart for details).        78 year old lady past medical history A. Fib on AC, SVT, heart failure, breast cancer presents to with shortness of breath.  EMS reported heart rate in 140s prior to arrival.  Here patient has remained in sinus rhythm.  Her EKG is without ischemic changes.  BNP is noted to be somewhat elevated but similar to prior.  Chest x-ray similar to prior, mild heart failure.  She does not have significant edema in her legs.  Symptoms may be related to a run of A. fib versus SVT or possibly heart failure.  Plan to have patient have further observation, ambulation trial.  While waiting for further observation, ambulation trial, patient signed out to Dr. Sedonia Small. Plan is pending reassessment and how she does on ambulation trial.  Please refer to Bero's note for final plan and dispo.    Final Clinical Impressions(s) / ED Diagnoses   Final diagnoses:  Palpitations  Shortness of breath    ED Discharge Orders    None       Lucrezia Starch, MD 12/14/18 1546

## 2018-12-14 NOTE — ED Triage Notes (Addendum)
Pt to triage via [facility]> Sudden onset SOB and tachycardia at 8am after taking her medications.  Per EMS, BP 90/40 NS 500c bolus and repeat  BP 136/-.  Hx of CHF.  HR 140 during transported and converted to 80 SR on arrival to ED.  Pt wears 2 liters O2 at home.  Pt with coughing and dry heaves that started on arrival to triage.  Reports belching over the last 4 days.

## 2018-12-15 ENCOUNTER — Inpatient Hospital Stay (HOSPITAL_COMMUNITY): Payer: Medicare HMO

## 2018-12-15 ENCOUNTER — Other Ambulatory Visit: Payer: Self-pay | Admitting: Internal Medicine

## 2018-12-15 DIAGNOSIS — I5032 Chronic diastolic (congestive) heart failure: Secondary | ICD-10-CM

## 2018-12-15 DIAGNOSIS — J189 Pneumonia, unspecified organism: Secondary | ICD-10-CM

## 2018-12-15 DIAGNOSIS — K219 Gastro-esophageal reflux disease without esophagitis: Secondary | ICD-10-CM

## 2018-12-15 DIAGNOSIS — I361 Nonrheumatic tricuspid (valve) insufficiency: Secondary | ICD-10-CM

## 2018-12-15 DIAGNOSIS — E875 Hyperkalemia: Secondary | ICD-10-CM

## 2018-12-15 LAB — COMPREHENSIVE METABOLIC PANEL
ALT: 16 U/L (ref 0–44)
AST: 15 U/L (ref 15–41)
Albumin: 2.5 g/dL — ABNORMAL LOW (ref 3.5–5.0)
Alkaline Phosphatase: 79 U/L (ref 38–126)
Anion gap: 13 (ref 5–15)
BUN: 9 mg/dL (ref 8–23)
CO2: 30 mmol/L (ref 22–32)
Calcium: 7.6 mg/dL — ABNORMAL LOW (ref 8.9–10.3)
Chloride: 100 mmol/L (ref 98–111)
Creatinine, Ser: 1.11 mg/dL — ABNORMAL HIGH (ref 0.44–1.00)
GFR calc Af Amer: 55 mL/min — ABNORMAL LOW (ref >60)
GFR calc non Af Amer: 47 mL/min — ABNORMAL LOW (ref >60)
Glucose, Bld: 128 mg/dL — ABNORMAL HIGH (ref 70–99)
Potassium: 2.9 mmol/L — ABNORMAL LOW (ref 3.5–5.1)
Sodium: 143 mmol/L (ref 135–145)
Total Bilirubin: 0.4 mg/dL (ref 0.3–1.2)
Total Protein: 5.6 g/dL — ABNORMAL LOW (ref 6.5–8.1)

## 2018-12-15 LAB — ECHOCARDIOGRAM LIMITED
Height: 66 in
Weight: 2850.11 oz

## 2018-12-15 LAB — CBC
HCT: 32.7 % — ABNORMAL LOW (ref 36.0–46.0)
Hemoglobin: 9.8 g/dL — ABNORMAL LOW (ref 12.0–15.0)
MCH: 28.7 pg (ref 26.0–34.0)
MCHC: 30 g/dL (ref 30.0–36.0)
MCV: 95.9 fL (ref 80.0–100.0)
Platelets: 442 10*3/uL — ABNORMAL HIGH (ref 150–400)
RBC: 3.41 MIL/uL — ABNORMAL LOW (ref 3.87–5.11)
RDW: 15.8 % — ABNORMAL HIGH (ref 11.5–15.5)
WBC: 9.1 10*3/uL (ref 4.0–10.5)
nRBC: 0 % (ref 0.0–0.2)

## 2018-12-15 LAB — RESPIRATORY PANEL BY PCR

## 2018-12-15 LAB — HIV ANTIBODY (ROUTINE TESTING W REFLEX): HIV Screen 4th Generation wRfx: NONREACTIVE

## 2018-12-15 LAB — TROPONIN I (HIGH SENSITIVITY)
Troponin I (High Sensitivity): 43 ng/L — ABNORMAL HIGH (ref <18)
Troponin I (High Sensitivity): 35 ng/L — ABNORMAL HIGH (ref <18)

## 2018-12-15 MED ORDER — POTASSIUM CHLORIDE CRYS ER 20 MEQ PO TBCR
40.0000 meq | EXTENDED_RELEASE_TABLET | ORAL | Status: AC
  Administered 2018-12-15 (×2): 40 meq via ORAL
  Filled 2018-12-15 (×2): qty 2

## 2018-12-15 MED ORDER — LORATADINE 10 MG PO TABS
10.0000 mg | ORAL_TABLET | Freq: Every day | ORAL | Status: DC | PRN
  Administered 2018-12-15: 10 mg via ORAL
  Filled 2018-12-15: qty 1

## 2018-12-15 MED ORDER — ABEMACICLIB 50 MG PO TABS
50.0000 mg | ORAL_TABLET | Freq: Two times a day (BID) | ORAL | Status: DC
  Administered 2018-12-15: 50 mg via ORAL
  Filled 2018-12-15 (×3): qty 1

## 2018-12-15 MED ORDER — SALINE SPRAY 0.65 % NA SOLN
1.0000 | NASAL | Status: DC | PRN
  Administered 2018-12-15: 1 via NASAL
  Filled 2018-12-15: qty 44

## 2018-12-15 NOTE — Plan of Care (Signed)
  Problem: Education: Goal: Knowledge of General Education information will improve Description: Including pain rating scale, medication(s)/side effects and non-pharmacologic comfort measures Outcome: Progressing   Problem: Health Behavior/Discharge Planning: Goal: Ability to manage health-related needs will improve Outcome: Progressing   Problem: Clinical Measurements: Goal: Ability to maintain clinical measurements within normal limits will improve Outcome: Progressing Goal: Will remain free from infection Outcome: Progressing Goal: Diagnostic test results will improve Outcome: Progressing Goal: Respiratory complications will improve Outcome: Progressing Goal: Cardiovascular complication will be avoided Outcome: Progressing   Problem: Activity: Goal: Risk for activity intolerance will decrease Outcome: Progressing   Problem: Nutrition: Goal: Adequate nutrition will be maintained Outcome: Progressing   Problem: Coping: Goal: Level of anxiety will decrease Outcome: Progressing   Problem: Elimination: Goal: Will not experience complications related to bowel motility Outcome: Progressing Goal: Will not experience complications related to urinary retention Outcome: Progressing   Problem: Pain Managment: Goal: General experience of comfort will improve Outcome: Progressing   Problem: Safety: Goal: Ability to remain free from injury will improve Outcome: Progressing   Problem: Skin Integrity: Goal: Risk for impaired skin integrity will decrease Outcome: Progressing   Problem: Education: Goal: Ability to demonstrate management of disease process will improve Outcome: Progressing Goal: Ability to verbalize understanding of medication therapies will improve Outcome: Progressing Goal: Individualized Educational Video(s) Outcome: Progressing   Problem: Activity: Goal: Capacity to carry out activities will improve Outcome: Progressing   Problem: Cardiac: Goal:  Ability to achieve and maintain adequate cardiopulmonary perfusion will improve Outcome: Progressing   Problem: Education: Goal: Ability to demonstrate management of disease process will improve Outcome: Progressing Goal: Ability to verbalize understanding of medication therapies will improve Outcome: Progressing Goal: Individualized Educational Video(s) Outcome: Progressing   Problem: Activity: Goal: Capacity to carry out activities will improve Outcome: Progressing   Problem: Cardiac: Goal: Ability to achieve and maintain adequate cardiopulmonary perfusion will improve Outcome: Progressing   

## 2018-12-15 NOTE — Progress Notes (Signed)
MD paged and notified about patient's son wanting a call for updates.

## 2018-12-15 NOTE — Progress Notes (Addendum)
PROGRESS NOTE    Beverly Tucker  [id number] DOB: 09-02-1940 DOA: 12/14/2018 PCP: [name], MD    Brief Narrative:  Beverly Tucker is a 78 y.o. 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 Per EMS pt found with SVT 140's and bp 90/40.    Consultants:   none  Procedures:  CT chest: 1. Patchy areas of nodularity in the right chest and some basilar consolidative changes on the right raising the question of superimposed pneumonia. 2. No pulmonary embolism identified. 3. Interval placement of a PleurX catheter in the left posterior chest. Overall volume of fluid in the left chest similar compared to the prior study. 4. Pleural fluid in the right chest compared to the prior study. 5. Known fungating mass associated with the right breast is redemonstrated. Thickness and nodularity may be slightly diminished. 6. Bony metastatic disease as above.  Echo: 1. Left ventricular ejection fraction, by visual estimation, is 60 to 65%. The left ventricle has normal function. There is no left ventricular hypertrophy.  2. Left ventricular diastolic parameters are consistent with Grade I diastolic dysfunction (impaired relaxation).  3. Global right ventricle has normal systolic function.The right ventricular size is normal. No increase in right ventricular wall thickness.  4. Left atrial size was normal.  5. Right atrial size was normal.  6. Severe mitral annular calcification.  7. The mitral valve is normal in structure. Trace mitral valve regurgitation. No evidence of mitral stenosis.  8. The tricuspid valve is normal in structure. Tricuspid valve  regurgitation is mild.  9. The aortic valve is normal in structure. Aortic valve regurgitation is trivial. Mild aortic valve sclerosis without stenosis. 10. The pulmonic valve was normal in structure. Pulmonic valve regurgitation is not visualized. 11. The inferior vena cava is normal in size with greater than 50% respiratory variability, suggesting right atrial pressure of 3 mmHg.   Antimicrobials:   ceftrixone and azithromycin   Subjective: Pt feels breathing the same. No fever, chills, or cp.   Objective: Vitals:   12/14/18 2350 12/15/18 0100 12/15/18 0430 12/15/18 0553  BP: (!) 142/74 (!) 144/56 135/66   Pulse: 79 74 (!) 55   Resp:  (!) 22 (!) 22   Temp:  97.8 F (36.6 C) 97.8 F (36.6 C)   TempSrc:  Oral Oral   SpO2:  100% 100%   Weight:    80.8 kg  Height:        Intake/Output Summary (Last 24 hours) at 12/15/2018 0723 Last data filed at 12/15/2018 0400 Gross per 24 hour  Intake 600 ml  Output 100 ml  Net 500 ml   Filed Weights   12/14/18 2048 12/15/18 0553  Weight: 79.5 kg 80.8 kg    Examination:  General exam: Appears calm and comfortable , mildly sob, on oxygen, non toxic Respiratory system:  Decrease air exchange, no rhonchi,  no wheezing or crackles Cardiovascular system: S1 & S2 heard, RRR. No JVD, murmurs, rubs, gallops or clicks.  Gastrointestinal system: Abdomen is nondistended, soft and nontender. Normal bowel sounds heard. Central nervous system: Alert and oriented. No focal neurological deficits. Extremities: no cyanosis or edema Skin: warm and dry Psychiatry: Judgement and insight appear normal. Mood & affect appropriate.  Data Reviewed: I have personally reviewed following labs and imaging studies  CBC: Recent Labs  Lab 12/14/18 1325 12/15/18 0421  WBC 9.5 9.1  HGB 10.8* 9.8*  HCT 35.9* 32.7*  MCV 96.5 95.9  PLT 504* [id number]*   Basic Metabolic Panel: Recent Labs  Lab 12/14/18 1325 12/15/18 0421  NA 141 143  K 3.0* 2.9*  CL  96* 100  CO2 32 30  GLUCOSE 147* 128*  BUN 10 9  CREATININE 1.36* 1.11*  CALCIUM 7.8* 7.6*  MG 2.1  --    GFR: Estimated Creatinine Clearance: 44.1 mL/min (A) (by C-G formula based on SCr of 1.11 mg/dL (H)). Liver Function Tests: Recent Labs  Lab 12/15/18 0421  AST 15  ALT 16  ALKPHOS 79  BILITOT 0.4  PROT 5.6*  ALBUMIN 2.5*   No results for input(s): LIPASE, AMYLASE in the last 168 hours. No results for input(s): AMMONIA in the last 168 hours. Coagulation Profile: No results for input(s): INR, PROTIME in the last 168 hours. Cardiac Enzymes: No results for input(s): CKTOTAL, CKMB, CKMBINDEX, TROPONINI in the last 168 hours. BNP (last 3 results) No results for input(s): PROBNP in the last 8760 hours. HbA1C: No results for input(s): HGBA1C in the last 72 hours. CBG: No results for input(s): GLUCAP in the last 168 hours. Lipid Profile: No results for input(s): CHOL, HDL, LDLCALC, TRIG, CHOLHDL, LDLDIRECT in the last 72 hours. Thyroid Function Tests: No results for input(s): TSH, T4TOTAL, FREET4, T3FREE, THYROIDAB in the last 72 hours. Anemia Panel: No results for input(s): VITAMINB12, FOLATE, FERRITIN, TIBC, IRON, RETICCTPCT in the last 72 hours. Sepsis Labs: No results for input(s): PROCALCITON, LATICACIDVEN in the last 168 hours.  Recent Results (from the past 240 hour(s))  SARS CORONAVIRUS 2 (TAT 6-24 HRS) Nasopharyngeal Nasopharyngeal Swab     Status: None   Collection Time: 12/14/18  5:47 PM   Specimen: Nasopharyngeal Swab  Result Value Ref Range Status   SARS Coronavirus 2 NEGATIVE NEGATIVE Final    Comment: (NOTE) SARS-CoV-2 target nucleic acids are NOT DETECTED. The SARS-CoV-2 RNA is generally detectable in upper and lower respiratory specimens during the acute phase of infection. Negative results do not preclude SARS-CoV-2 infection, do not rule out co-infections with other pathogens, and should not be used as the sole basis for treatment or other patient  management decisions. Negative results must be combined with clinical observations, patient history, and epidemiological information. The expected result is Negative. Fact Sheet for Patients: SugarRoll.be Fact Sheet for Healthcare Providers: https://www.woods-mathews.com/ This test is not yet approved or cleared by the [place] FDA and  has been authorized for detection and/or diagnosis of SARS-CoV-2 by FDA under an Emergency Use Authorization (EUA). This EUA will remain  in effect (meaning this test can be used) for the duration of the COVID-19 declaration under Section 56 4(b)(1) of the Act, 21 U.S.C. section 360bbb-3(b)(1), unless the authorization is terminated or revoked sooner. Performed at [facility], [place] [address] [place], [place] [address]   Respiratory Panel by PCR     Status: None   Collection Time: 12/14/18 10:35 PM   Specimen: Nasopharyngeal Swab; Respiratory  Result Value Ref Range Status   Adenovirus NOT DETECTED NOT DETECTED Final   Coronavirus 229E NOT DETECTED NOT DETECTED Final    Comment: (NOTE) The Coronavirus on the Respiratory Panel, DOES NOT test for the novel  Coronavirus (2019 nCoV)    Coronavirus HKU1 NOT DETECTED NOT DETECTED Final   Coronavirus NL63 NOT  DETECTED NOT DETECTED Final   Coronavirus OC43 NOT DETECTED NOT DETECTED Final   Metapneumovirus NOT DETECTED NOT DETECTED Final   Rhinovirus / Enterovirus NOT DETECTED NOT DETECTED Final   Influenza A NOT DETECTED NOT DETECTED Final   Influenza B NOT DETECTED NOT DETECTED Final   Parainfluenza Virus 1 NOT DETECTED NOT DETECTED Final   Parainfluenza Virus 2 NOT DETECTED NOT DETECTED Final   Parainfluenza Virus 3 NOT DETECTED NOT DETECTED Final   Parainfluenza Virus 4 NOT DETECTED NOT DETECTED Final   Respiratory Syncytial Virus NOT DETECTED NOT DETECTED Final   Bordetella pertussis NOT DETECTED NOT DETECTED Final   Chlamydophila pneumoniae NOT  DETECTED NOT DETECTED Final   Mycoplasma pneumoniae NOT DETECTED NOT DETECTED Final    Comment: Performed at [facility], [place] [address], [place], [place] [address]         Radiology Studies: Dg Chest 2 View  Result Date: 12/14/2018 CLINICAL DATA:  Shortness of breath EXAM: CHEST - 2 VIEW COMPARISON:  11/19/2018 FINDINGS: Cardiomegaly. Unchanged small bilateral pleural effusions and associated atelectasis or consolidation with a left-sided tunneled pleural drainage catheter. No new airspace opacity. The visualized skeletal structures are unremarkable. IMPRESSION: 1. Unchanged small bilateral pleural effusions and associated atelectasis or consolidation with a left-sided tunneled pleural drainage catheter. No new airspace opacity. 2.  Cardiomegaly. Electronically Signed   By: Eddie Candle M.D.   On: 12/14/2018 14:28   Cta Chest For Pe  Result Date: 12/14/2018 CLINICAL DATA:  Dyspnea, recent hospitalization. History of adenocarcinoma and malignant effusion. EXAM: CT ANGIOGRAPHY CHEST WITH CONTRAST TECHNIQUE: Multidetector CT imaging of the chest was performed using the standard protocol during bolus administration of intravenous contrast. Multiplanar CT image reconstructions and MIPs were obtained to evaluate the vascular anatomy. CONTRAST:  65mL OMNIPAQUE IOHEXOL 350 MG/ML SOLN COMPARISON:  Multiple priors including CT from 10/16/2018 and chest x-rays from October and today. FINDINGS: Cardiovascular: Heart size is stable with signs of right heart enlargement and engorgement of central pulmonary vasculature. Main pulmonary artery at 3.5 cm. No signs of pulmonary embolism. Basilar vascular assessment limited by respiratory motion. Aorta is of normal caliber. Mediastinum/Nodes: No signs of adenopathy in the chest. Lungs/Pleura: Interval placement of a PleurX catheter in the left posterior chest. Loculated, known malignant effusion in the left chest with increased fissural component compared to  previous study but reduced posterior component. Overall volume of fluid is similar compared to previous exam. Increased pleural fluid however in the right chest compared to the prior study. Patchy areas of nodularity in the right mid chest. No signs of pneumothorax. Upper Abdomen: Incidental imaging of upper abdominal contents shows no acute abnormality. Musculoskeletal: Known fungating mass associated with the right breast is redemonstrated. Thickness and nodularity may be diminished compared to the prior study measuring approximately 1 cm in greatest thickness where there are areas that previously measured 1.4-1.5 cm. The significance is uncertain. There is an obvious soft tissue defect in this location. Multifocal areas of bony sclerosis and scattered areas of lytic change period. Sclerotic lesions scattered about the thoracic spine compatible with metastatic disease. Example of lytic in subtly expansile lesions in the ribs best seen on sagittal image 24 of series 9 in the right lateral fourth rib. There also subacute fractures in inferior ribs along the right chest. Review of the MIP images confirms the above findings. IMPRESSION: 1. Patchy areas of nodularity in the right chest and some basilar consolidative changes on the right raising the question of  superimposed pneumonia. 2. No pulmonary embolism identified. 3. Interval placement of a PleurX catheter in the left posterior chest. Overall volume of fluid in the left chest similar compared to the prior study. 4. Pleural fluid in the right chest compared to the prior study. 5. Known fungating mass associated with the right breast is redemonstrated. Thickness and nodularity may be slightly diminished. 6. Bony metastatic disease as above. Aortic Atherosclerosis (ICD10-I70.0). Electronically Signed   By: Zetta Bills M.D.   On: 12/14/2018 17:26        Scheduled Meds:  apixaban  5 mg Oral BID   furosemide  40 mg Oral Daily   metoprolol tartrate  75  mg Oral BID   ondansetron (ZOFRAN) IV  4 mg Intravenous Q6H   [START ON 12/16/2018] Vitamin D (Ergocalciferol)  50,000 Units Oral Weekly   Continuous Infusions:  azithromycin     cefTRIAXone (ROCEPHIN)  IV      Assessment & Plan:   Active Problems:   Shortness of breath  1.HAP -On ceftrixone and azithromycin, will continue -Aspiratory panel negative -Blood culture pending -Continue with pulmonary toilet -Supportive care with O2  2.SVT/history of atrial fibrillation  -On apixaban and metoprolol -In normal sinus rhythm -Can follow-up with cardiology as outpatient if no further episodes of SVT   3.Elevated troponin troponin  Likely secondary  to demand from tachycardia -Asymptomatic without chest pain.  Troponins trending down. -Echo with normal EF   4.History of metastatic breast cancer -Restart her chemo p.o. pill -Did notify her oncologist that she is admitted  5.  Chronic diastolic heart failure -Appears euvolemic, compensated -Continue to monitor him status  6.  Essential hypertension -Had episode of hypotension with tachycardia, currently stable -Continue with beta blk   7.GERD -Continue PPI  8.  Hypokalemia Will replace Her closely   DVT prophylaxis: Apixaban Code Status: Full code Family Communication:  disposition Plan: Patient is expected to be admitted more than 2 midnights until medically stable to be discharged       LOS: 1 day   Time spent: 45 minutes with more than 50% COC    Nolberto Hanlon, MD [facility] Pager [phone]  If 7PM-7AM, please contact night-coverage [email or web address] Password [email or web address] 12/15/2018, 7:23 AM

## 2018-12-15 NOTE — Plan of Care (Signed)
  Problem: Education: Goal: Knowledge of General Education information will improve Description: Including pain rating scale, medication(s)/side effects and non-pharmacologic comfort measures Outcome: Progressing   Problem: Health Behavior/Discharge Planning: Goal: Ability to manage health-related needs will improve Outcome: Progressing   Problem: Nutrition: Goal: Adequate nutrition will be maintained Outcome: Progressing   Problem: Activity: Goal: Risk for activity intolerance will decrease Outcome: Progressing

## 2018-12-15 NOTE — Progress Notes (Addendum)
Received report from off going RN, [place] Pt resting in bed with pericare being provided by RN and NT. No c/o at this time.

## 2018-12-15 NOTE — Consult Note (Signed)
WOC Nurse Consult Note: Reason for Consult:neoplastic lesion (fungating breast tumor) on right medial breast Wound type: neoplastic Pressure Injury POA: N/A Measurement: 2.50mc x 5.5cm x 0.8cm Wound bed:red, friable, bleeds easily Drainage (amount, consistency, odor) serosanguinous upon removal.  No odor. Periwound: with moisture associated skin damage (MASD) from saline dressings and occlusion, suspect fungal overgrowth. Dressing procedure/placement/frequency: I will provide Nursing with guidance for topical care using a xeroform gauze as a wound contact layer while in house as the saline is keeping the area too moist. I have also advised to use paper tape as it is much more gentle in this vulnerable area than Medipore.  If you agree and if it is not contraindicated, please consider a few doses of oral antifungal (eg., diflucan) to treat the periwound fungal rash.    [place] nursing team will not follow, but will remain available to this patient, the nursing and medical teams.  Please re-consult if needed. Thanks, Maudie Flakes, MSN, RN, [place], [facility]  Pager# [phone]

## 2018-12-15 NOTE — Progress Notes (Signed)
  Echocardiogram 2D Echocardiogram has been performed.  Beverly Tucker 12/15/2018, 10:07 AM

## 2018-12-16 DIAGNOSIS — E876 Hypokalemia: Secondary | ICD-10-CM

## 2018-12-16 DIAGNOSIS — I10 Essential (primary) hypertension: Secondary | ICD-10-CM

## 2018-12-16 LAB — BASIC METABOLIC PANEL
Anion gap: 7 (ref 5–15)
BUN: 8 mg/dL (ref 8–23)
CO2: 33 mmol/L — ABNORMAL HIGH (ref 22–32)
Calcium: 7.2 mg/dL — ABNORMAL LOW (ref 8.9–10.3)
Chloride: 103 mmol/L (ref 98–111)
Creatinine, Ser: 0.97 mg/dL (ref 0.44–1.00)
GFR calc Af Amer: >60 mL/min (ref >60)
GFR calc non Af Amer: 56 mL/min — ABNORMAL LOW (ref >60)
Glucose, Bld: 116 mg/dL — ABNORMAL HIGH (ref 70–99)
Potassium: 3.3 mmol/L — ABNORMAL LOW (ref 3.5–5.1)
Sodium: 143 mmol/L (ref 135–145)

## 2018-12-16 LAB — BRAIN NATRIURETIC PEPTIDE: B Natriuretic Peptide: 115.4 pg/mL — ABNORMAL HIGH (ref 0.0–100.0)

## 2018-12-16 MED ORDER — ABEMACICLIB 50 MG PO TABS
50.0000 mg | ORAL_TABLET | Freq: Every day | ORAL | Status: DC
  Administered 2018-12-17 – 2018-12-18 (×2): 50 mg via ORAL
  Filled 2018-12-16: qty 1

## 2018-12-16 MED ORDER — POTASSIUM CHLORIDE CRYS ER 10 MEQ PO TBCR
10.0000 meq | EXTENDED_RELEASE_TABLET | Freq: Once | ORAL | Status: AC
  Administered 2018-12-16: 10 meq via ORAL
  Filled 2018-12-16: qty 1

## 2018-12-16 MED ORDER — FUROSEMIDE 10 MG/ML IJ SOLN
40.0000 mg | Freq: Every day | INTRAMUSCULAR | Status: DC
  Administered 2018-12-16: 40 mg via INTRAVENOUS
  Filled 2018-12-16: qty 4

## 2018-12-16 MED ORDER — FLUCONAZOLE 100 MG PO TABS
100.0000 mg | ORAL_TABLET | Freq: Once | ORAL | Status: AC
  Administered 2018-12-16: 100 mg via ORAL
  Filled 2018-12-16: qty 1

## 2018-12-16 MED ORDER — FUROSEMIDE 40 MG PO TABS
40.0000 mg | ORAL_TABLET | Freq: Every day | ORAL | Status: DC
  Administered 2018-12-18 – 2018-12-21 (×4): 40 mg via ORAL
  Filled 2018-12-16 (×4): qty 1

## 2018-12-16 MED ORDER — POTASSIUM CHLORIDE 20 MEQ PO PACK
40.0000 meq | PACK | Freq: Once | ORAL | Status: AC
  Administered 2018-12-16: 40 meq via ORAL
  Filled 2018-12-16: qty 2

## 2018-12-16 NOTE — Progress Notes (Signed)
Dr. Grier Mitts office was contacted based on my conversation with the pt's son who said she was told to take the Verzenio just once daily for now due to aches and nausea. The RN, Lovey Newcomer, verified that is correct based on a note from 12/04/2018. The PTA list and order has been updated to reflect this dosing.  Assunta Curtis, [place] Clinical Pharmacist

## 2018-12-16 NOTE — Progress Notes (Signed)
Patient is reporting that she does not feel good but can not exactly describe the way she feels. Vitals are taken and look stable. MD paged and notified. Patient is refusing Xanax for right now.

## 2018-12-16 NOTE — Progress Notes (Signed)
Patient was requesting her chemotherapy medication this morning. MD at bedside and notified.

## 2018-12-16 NOTE — Progress Notes (Signed)
Patient is refusing to take chemotherapy medicine Verzinio during the day, patient educated, son [name] stated that she is only supposed to take it once a day. Pharmacist aware and Md Amery notified. Pharmacist will contact the son.

## 2018-12-16 NOTE — Progress Notes (Signed)
PT Cancellation Note  Patient Details Name: Beverly Tucker MRN: [id number] DOB: 1940/11/09   Cancelled Treatment:    Reason Eval/Treat Not Completed: Patient declined, no reason specified Patient politely declines PT today stating "well I just got through getting washed up and back in bed so you'd better come back", states she is very fatigued after those activities. Unable to convince her to participate in PT. Will attempt to try back if time/schedule allow.    Windell Norfolk, DPT, PN1   Supplemental Physical Therapist [facility]    Pager [phone] Acute Rehab Office [phone]

## 2018-12-16 NOTE — TOC Initial Note (Signed)
Transition of Care [facility]) - Initial/Assessment Note    Patient Details  Name: Beverly Tucker MRN: [id number] Date of Birth: 06-29-1940  [facility] of [facility]) CM/SW Contact:    Zenon Mayo, RN Phone Number: 12/16/2018, 6:42 PM  Clinical Narrative:                 From home alone, has home oxygen 2 liters, tachy, has breast ca , on iv abx, chemotherapy po, diuresing with iv lasix, pt/ot to eval. TOC team to continue to follow for TOC needs.   Expected Discharge Plan: [place] Barriers to Discharge: No Barriers Identified   Patient Goals and CMS Choice        Expected Discharge Plan and Services Expected Discharge Plan: [place]   Discharge Planning Services: CM Consult   Living arrangements for the past 2 months: Single Family Home                 DME Arranged: (NA)         HH Arranged: NA          Prior Living Arrangements/Services Living arrangements for the past 2 months: Single Family Home Lives with:: Self Patient language and need for interpreter reviewed:: Yes        Need for Family Participation in Patient Care: Yes (Comment) Care giver support system in place?: Yes (comment) Current home services: DME(home oxygen) Criminal Activity/Legal Involvement Pertinent to Current Situation/Hospitalization: No - Comment as needed  Activities of Daily Living Home Assistive Devices/Equipment: Dentures (specify type), Oxygen ADL Screening (condition at time of admission) Patient's cognitive ability adequate to safely complete daily activities?: Yes Is the patient deaf or have difficulty hearing?: No Does the patient have difficulty seeing, even when wearing glasses/contacts?: No Does the patient have difficulty concentrating, remembering, or making decisions?: No Patient able to express need for assistance with ADLs?: Yes Does the patient have difficulty dressing or bathing?: No Independently performs ADLs?: Yes  (appropriate for developmental age) Does the patient have difficulty walking or climbing stairs?: Yes Weakness of Legs: Both Weakness of Arms/Hands: Both  Permission Sought/Granted                  Emotional Assessment       Orientation: : Oriented to Self, Oriented to Place, Oriented to  Time, Oriented to Situation Alcohol / Substance Use: Not Applicable Psych Involvement: No (comment)  Admission diagnosis:  Palpitations [R00.2] Shortness of breath [R06.02] Dyspnea, unspecified type [R06.00] Community acquired pneumonia, unspecified laterality [J18.9] Patient Active Problem List   Diagnosis Date Noted  . Hypokalemia   . Community acquired pneumonia   . Hyperkalemia   . Gastroesophageal reflux disease without esophagitis   . Malnutrition of moderate degree 11/18/2018  . SVT (supraventricular tachycardia) ([place]) 11/16/2018  . Acute on chronic diastolic CHF (congestive heart failure) ([place]) 11/16/2018  . Elevated troponin 11/16/2018  . Leukocytosis 11/16/2018  . Atrial fibrillation, chronic ([place]) 11/16/2018  . Malignant pleural effusion   . 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 11/06/2018  . AKI (acute kidney injury) ([place]) 11/06/2018  . AF (paroxysmal atrial fibrillation) ([place]) 11/06/2018  . Pleural effusion 11/06/2018  . Bone metastases ([place]) 11/04/2018  . Metastatic breast cancer ([place])   . Pneumothorax   . Gram-positive bacteremia   . Mass of breast, right 10/15/2018  . Acute CHF (congestive heart failure) ([place]) 10/15/2018  . Shortness of breath 10/14/2018  . CKD (chronic kidney disease) stage 3,  GFR 30-59 ml/min 02/06/2018  . HTN (hypertension) 12/01/2015   PCP:  [name], MD Pharmacy:   [id number] - [place], [place] - [address] [place] [address] Phone: [phone] Fax: [place], [place] [address] Phone: [phone] Fax: [phone]     Social Determinants of Health (SDOH) Interventions    Readmission Risk Interventions Readmission Risk Prevention Plan 11/24/2018 10/23/2018  Transportation Screening Complete Complete  PCP or Specialist Appt within 5-7 Days - Complete  PCP or Specialist Appt within 3-5 Days Complete -  Home Care Screening - Complete  Medication Review (RN CM) - Complete  HRI or Home Care Consult Complete -  Social Work Consult for [place] Planning/Counseling Complete -  Palliative Care Screening Not Applicable -  Medication Review (RN Care Manager) Complete -  Some recent data might be hidden

## 2018-12-16 NOTE — Progress Notes (Signed)
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 Per EMS pt found with SVT 140's and bp 90/40.    Consultants:   none  Procedures:  CT chest: 1. Patchy areas of nodularity in the right chest and some basilar consolidative changes on the right raising the question of superimposed pneumonia. 2. No pulmonary embolism identified. 3. Interval placement of a PleurX catheter in the left posterior chest. Overall volume of fluid in the left chest similar compared to the prior study. 4. Pleural fluid in the right chest compared to the prior study. 5. Known fungating mass associated with the right breast is redemonstrated. Thickness and nodularity may be slightly diminished. 6. Bony metastatic disease as above.  Echo: 1. Left ventricular ejection fraction, by visual estimation, is 60 to 65%. The left ventricle has normal function. There is no left ventricular hypertrophy. 2. Left ventricular diastolic parameters are consistent with Grade I diastolic dysfunction (impaired relaxation). 3. Global right ventricle has normal systolic function.The right ventricular size is normal. No increase in right ventricular wall thickness. 4. Left atrial size was normal. 5. Right atrial size was normal. 6. Severe mitral annular calcification. 7. The mitral valve is normal in structure. Trace mitral valve regurgitation. No evidence of mitral stenosis. 8. The tricuspid valve is normal in structure. Tricuspid valve  regurgitation is mild. 9. The aortic valve is normal in structure. Aortic valve regurgitation is trivial. Mild aortic valve sclerosis without stenosis. 10. The pulmonic valve was normal in structure. Pulmonic valve regurgitation is not visualized. 11. The inferior vena cava is normal in size with greater than 50% respiratory variability, suggesting right atrial pressure of 3 mmHg.   Antimicrobials:   ceftrixone and azithromycin   Subjective: She refused her chemotherapy medication and states only takes it at nighttime.  Reports having good urine output.  Also reports her breathing is better.  Nuys any chest pain.  Objective: Vitals:   12/16/18 0435 12/16/18 0437 12/16/18 0500 12/16/18 0738  BP: (!) 151/131   (!) 149/46  Pulse: 81 79  77  Resp: 19   (!) 24  Temp: 97.8 F (36.6 C)   97.7 F (36.5 C)  TempSrc:    Oral  SpO2: 94% 95%  100%  Weight:   79.8 kg   Height:        Intake/Output Summary (Last 24 hours) at 12/16/2018 1114 Last data filed at 12/16/2018 0400 Gross per 24 hour  Intake 1100.88 ml  Output 900 ml  Net 200.88 ml   Filed Weights   12/14/18 2048 12/15/18 0553 12/16/18 0500  Weight: 79.5 kg 80.8 kg 79.8 kg    Examination:  General exam: Appears calm and comfortable , non tachypnic. nad Respiratory system: Clear to auscultation. No crackles, wheezing, or rhonchi Cardiovascular system: S1 & S2 heard, RRR. No JVD, murmurs Gastrointestinal system: abd soft, nt/nd +bs Central nervous system: Alert and oriented x3 . No focal neurological deficits. Extremities: no edema Skin: warm, dry Psychiatry: JMood & affect appropriate.     Data Reviewed: I have personally reviewed following labs and imaging studies  CBC: Recent Labs  Lab 12/14/18 1325 12/15/18 0421  WBC 9.5 9.1  HGB 10.8* 9.8*  HCT 35.9* 32.7*  MCV 96.5 95.9  PLT 504* [id number]*   Basic Metabolic Panel: Recent Labs  Lab 12/14/18 1325 12/15/18 0421 12/16/18 0545  NA 141 143 143  K 3.0*  2.9* 3.3*  CL 96* 100 103  CO2 32 30 33*  GLUCOSE 147* 128* 116*  BUN 10 9 8   CREATININE 1.36* 1.11* 0.97  CALCIUM 7.8* 7.6* 7.2*  MG 2.1  --   --    GFR: Estimated Creatinine Clearance: 50.1 mL/min (by C-G formula based on SCr of 0.97 mg/dL). Liver Function Tests: Recent Labs  Lab 12/15/18 0421  AST 15  ALT 16  ALKPHOS 79  BILITOT 0.4  PROT 5.6*  ALBUMIN 2.5*   No results for input(s): LIPASE, AMYLASE in the last 168 hours. No results for input(s): AMMONIA in the last 168 hours. Coagulation Profile: No results for input(s): INR, PROTIME in the last 168 hours. Cardiac Enzymes: No results for input(s): CKTOTAL, CKMB, CKMBINDEX, TROPONINI in the last 168 hours. BNP (last 3 results) No results for input(s): PROBNP in the last 8760 hours. HbA1C: No results for input(s): HGBA1C in the last 72 hours. CBG: No results for input(s): GLUCAP in the last 168 hours. Lipid Profile: No results for input(s): CHOL, HDL, LDLCALC, TRIG, CHOLHDL, LDLDIRECT in the last 72 hours. Thyroid Function Tests: No results for input(s): TSH, T4TOTAL, FREET4, T3FREE, THYROIDAB in the last 72 hours. Anemia Panel: No results for input(s): VITAMINB12, FOLATE, FERRITIN, TIBC, IRON, RETICCTPCT in the last 72 hours. Sepsis Labs: No results for input(s): PROCALCITON, LATICACIDVEN in the last 168 hours.  Recent Results (from the past 240 hour(s))  SARS CORONAVIRUS 2 (TAT 6-24 HRS) Nasopharyngeal Nasopharyngeal Swab     Status: None   Collection Time: 12/14/18  5:47 PM   Specimen: Nasopharyngeal Swab  Result Value Ref Range Status   SARS Coronavirus 2 NEGATIVE NEGATIVE Final    Comment: (NOTE) SARS-CoV-2 target nucleic acids are NOT DETECTED. The SARS-CoV-2 RNA is generally detectable in upper and lower respiratory specimens during the acute phase of infection. Negative results do not preclude SARS-CoV-2 infection, do not rule out co-infections with other pathogens, and should not be used as the sole  basis for treatment or other patient management decisions. Negative results must be combined with clinical observations, patient history, and epidemiological information. The expected result is Negative. Fact Sheet for Patients: SugarRoll.be Fact Sheet for Healthcare Providers: https://www.woods-mathews.com/ This test is not yet approved or cleared by the [place] FDA and  has been authorized for detection and/or diagnosis of SARS-CoV-2 by FDA under an Emergency Use Authorization (EUA). This EUA will remain  in effect (meaning this test can be used) for the duration of the COVID-19 declaration under Section 56 4(b)(1) of the Act, 21 U.S.C. section 360bbb-3(b)(1), unless the authorization is terminated or revoked sooner. Performed at [facility], [place] [address] [place], [place] [address]   Culture, blood (routine x 2) Call MD if unable to obtain prior to antibiotics being given     Status: None (Preliminary result)   Collection Time: 12/14/18 10:22 PM   Specimen: BLOOD  Result Value Ref Range Status   Specimen Description BLOOD LEFT ANTECUBITAL  Final   Special Requests   Final    BOTTLES DRAWN AEROBIC AND ANAEROBIC Blood Culture results may not be optimal due to an inadequate volume of blood received in culture bottles   Culture  Final    NO GROWTH 2 DAYS Performed at [facility], [place] [address], [place], [place] [address]    Report Status PENDING  Incomplete  Culture, blood (routine x 2) Call MD if unable to obtain prior to antibiotics being given     Status: None (Preliminary result)   Collection Time: 12/14/18 10:31 PM   Specimen: BLOOD  Result Value Ref Range Status   Specimen Description BLOOD LEFT ANTECUBITAL  Final   Special Requests   Final    BOTTLES DRAWN AEROBIC AND ANAEROBIC Blood Culture adequate volume   Culture   Final    NO GROWTH 2 DAYS Performed at [facility], [address],  [place], [place] [address]    Report Status PENDING  Incomplete  Respiratory Panel by PCR     Status: None   Collection Time: 12/14/18 10:35 PM   Specimen: Nasopharyngeal Swab; Respiratory  Result Value Ref Range Status   Adenovirus NOT DETECTED NOT DETECTED Final   Coronavirus 229E NOT DETECTED NOT DETECTED Final    Comment: (NOTE) The Coronavirus on the Respiratory Panel, DOES NOT test for the novel  Coronavirus (2019 nCoV)    Coronavirus HKU1 NOT DETECTED NOT DETECTED Final   Coronavirus NL63 NOT DETECTED NOT DETECTED Final   Coronavirus OC43 NOT DETECTED NOT DETECTED Final   Metapneumovirus NOT DETECTED NOT DETECTED Final   Rhinovirus / Enterovirus NOT DETECTED NOT DETECTED Final   Influenza A NOT DETECTED NOT DETECTED Final   Influenza B NOT DETECTED NOT DETECTED Final   Parainfluenza Virus 1 NOT DETECTED NOT DETECTED Final   Parainfluenza Virus 2 NOT DETECTED NOT DETECTED Final   Parainfluenza Virus 3 NOT DETECTED NOT DETECTED Final   Parainfluenza Virus 4 NOT DETECTED NOT DETECTED Final   Respiratory Syncytial Virus NOT DETECTED NOT DETECTED Final   Bordetella pertussis NOT DETECTED NOT DETECTED Final   Chlamydophila pneumoniae NOT DETECTED NOT DETECTED Final   Mycoplasma pneumoniae NOT DETECTED NOT DETECTED Final    Comment: Performed at [facility], [place]. [address], [place], [place] [address]         Radiology Studies: Dg Chest 2 View  Result Date: 12/14/2018 CLINICAL DATA:  Shortness of breath EXAM: CHEST - 2 VIEW COMPARISON:  11/19/2018 FINDINGS: Cardiomegaly. Unchanged small bilateral pleural effusions and associated atelectasis or consolidation with a left-sided tunneled pleural drainage catheter. No new airspace opacity. The visualized skeletal structures are unremarkable. IMPRESSION: 1. Unchanged small bilateral pleural effusions and associated atelectasis or consolidation with a left-sided tunneled pleural drainage catheter. No new airspace opacity. 2.   Cardiomegaly. Electronically Signed   By: Eddie Candle M.D.   On: 12/14/2018 14:28   Cta Chest For Pe  Result Date: 12/14/2018 CLINICAL DATA:  Dyspnea, recent hospitalization. History of adenocarcinoma and malignant effusion. EXAM: CT ANGIOGRAPHY CHEST WITH CONTRAST TECHNIQUE: Multidetector CT imaging of the chest was performed using the standard protocol during bolus administration of intravenous contrast. Multiplanar CT image reconstructions and MIPs were obtained to evaluate the vascular anatomy. CONTRAST:  38mL OMNIPAQUE IOHEXOL 350 MG/ML SOLN COMPARISON:  Multiple priors including CT from 10/16/2018 and chest x-rays from October and today. FINDINGS: Cardiovascular: Heart size is stable with signs of right heart enlargement and engorgement of central pulmonary vasculature. Main pulmonary artery at 3.5 cm. No signs of pulmonary embolism. Basilar vascular assessment limited by respiratory motion. Aorta is of normal caliber. Mediastinum/Nodes: No signs of adenopathy in the chest. Lungs/Pleura: Interval placement of a PleurX  catheter in the left posterior chest. Loculated, known malignant effusion in the left chest with increased fissural component compared to previous study but reduced posterior component. Overall volume of fluid is similar compared to previous exam. Increased pleural fluid however in the right chest compared to the prior study. Patchy areas of nodularity in the right mid chest. No signs of pneumothorax. Upper Abdomen: Incidental imaging of upper abdominal contents shows no acute abnormality. Musculoskeletal: Known fungating mass associated with the right breast is redemonstrated. Thickness and nodularity may be diminished compared to the prior study measuring approximately 1 cm in greatest thickness where there are areas that previously measured 1.4-1.5 cm. The significance is uncertain. There is an obvious soft tissue defect in this location. Multifocal areas of bony sclerosis and scattered  areas of lytic change period. Sclerotic lesions scattered about the thoracic spine compatible with metastatic disease. Example of lytic in subtly expansile lesions in the ribs best seen on sagittal image 24 of series 9 in the right lateral fourth rib. There also subacute fractures in inferior ribs along the right chest. Review of the MIP images confirms the above findings. IMPRESSION: 1. Patchy areas of nodularity in the right chest and some basilar consolidative changes on the right raising the question of superimposed pneumonia. 2. No pulmonary embolism identified. 3. Interval placement of a PleurX catheter in the left posterior chest. Overall volume of fluid in the left chest similar compared to the prior study. 4. Pleural fluid in the right chest compared to the prior study. 5. Known fungating mass associated with the right breast is redemonstrated. Thickness and nodularity may be slightly diminished. 6. Bony metastatic disease as above. Aortic Atherosclerosis (ICD10-I70.0). Electronically Signed   By: Zetta Bills M.D.   On: 12/14/2018 17:26        Scheduled Meds:  abemaciclib  50 mg Oral BID   apixaban  5 mg Oral BID   furosemide  40 mg Intravenous Daily   metoprolol tartrate  75 mg Oral BID   ondansetron (ZOFRAN) IV  4 mg Intravenous Q6H   Vitamin D (Ergocalciferol)  50,000 Units Oral Weekly   Continuous Infusions:  azithromycin 500 mg (12/15/18 2019)   cefTRIAXone (ROCEPHIN)  IV 2 g (12/15/18 1814)    Assessment & Plan:   Active Problems:   Shortness of breath   Chronic diastolic CHF (congestive heart failure) ([place])   Community acquired pneumonia   Hyperkalemia   Gastroesophageal reflux disease without esophagitis  1.HAP -Continue ceftrixone and azithromycin -Respiratory panel negative -Blood culture pending -Continue with pulmonary toilet -Supportive care with O2  2.SVT/history of atrial fibrillation  -On apixaban and metoprolol -IStill in sinus rhythm -Can  follow-up with cardiology as outpatient if no further episodes of SVT   3.Elevated troponin troponin  Likely secondary  to demand ischemia from tachycardia -Asymptomatic without chest pain.   Troponins trending down. -Echo with normal EF   4.History of metastatic breast cancer -Wants to take chemo at night, pharmacy checking on dosing . -Notified patient's oncologist about her status as an inpatient  5.  Acute on Chronic diastolic heart failure -mildly vol. Overloaded this am.  -Give lasix 40mg  iv x1,then back to po in am -Continue to monitor vol. Status and lytes  6.  Essential hypertension -stable on beta blk   7.GERD -Continue PPI  8.Hypokalemia  replaced this am. Will place her on kcl 27meq daily Her closely  9. Consult PT/OT  DVT prophylaxis:Apixaban Code Status:Full code Family Communication: disposition Plan:if stable  possible d/c in am. PT/OT consult        LOS: 2 days   Time spent: 45 minutes with more than 50% on [place], MD [facility] Pager [phone]  If 7PM-7AM, please contact night-coverage [email or web address] Password [email or web address] 12/16/2018, 11:14 AM

## 2018-12-16 NOTE — Plan of Care (Signed)
  Problem: Education: Goal: Knowledge of General Education information will improve Description: Including pain rating scale, medication(s)/side effects and non-pharmacologic comfort measures Outcome: Progressing   Problem: Health Behavior/Discharge Planning: Goal: Ability to manage health-related needs will improve Outcome: Progressing   Problem: Clinical Measurements: Goal: Ability to maintain clinical measurements within normal limits will improve Outcome: Progressing Goal: Will remain free from infection Outcome: Progressing Goal: Diagnostic test results will improve Outcome: Progressing Goal: Respiratory complications will improve Outcome: Progressing Goal: Cardiovascular complication will be avoided Outcome: Progressing   Problem: Activity: Goal: Risk for activity intolerance will decrease Outcome: Progressing   Problem: Nutrition: Goal: Adequate nutrition will be maintained Outcome: Progressing   Problem: Coping: Goal: Level of anxiety will decrease Outcome: Progressing   Problem: Elimination: Goal: Will not experience complications related to bowel motility Outcome: Progressing Goal: Will not experience complications related to urinary retention Outcome: Progressing   Problem: Pain Managment: Goal: General experience of comfort will improve Outcome: Progressing   Problem: Safety: Goal: Ability to remain free from injury will improve Outcome: Progressing   Problem: Skin Integrity: Goal: Risk for impaired skin integrity will decrease Outcome: Progressing   Problem: Education: Goal: Ability to demonstrate management of disease process will improve Outcome: Progressing Goal: Ability to verbalize understanding of medication therapies will improve Outcome: Progressing Goal: Individualized Educational Video(s) Outcome: Progressing   Problem: Activity: Goal: Capacity to carry out activities will improve Outcome: Progressing   Problem: Cardiac: Goal:  Ability to achieve and maintain adequate cardiopulmonary perfusion will improve Outcome: Progressing   Problem: Education: Goal: Ability to demonstrate management of disease process will improve Outcome: Progressing Goal: Ability to verbalize understanding of medication therapies will improve Outcome: Progressing Goal: Individualized Educational Video(s) Outcome: Progressing   Problem: Activity: Goal: Capacity to carry out activities will improve Outcome: Progressing   Problem: Cardiac: Goal: Ability to achieve and maintain adequate cardiopulmonary perfusion will improve Outcome: Progressing   

## 2018-12-17 ENCOUNTER — Encounter (HOSPITAL_COMMUNITY): Payer: Self-pay | Admitting: Physician Assistant

## 2018-12-17 DIAGNOSIS — I471 Supraventricular tachycardia: Secondary | ICD-10-CM

## 2018-12-17 DIAGNOSIS — J189 Pneumonia, unspecified organism: Principal | ICD-10-CM

## 2018-12-17 DIAGNOSIS — I5033 Acute on chronic diastolic (congestive) heart failure: Secondary | ICD-10-CM

## 2018-12-17 DIAGNOSIS — E876 Hypokalemia: Secondary | ICD-10-CM

## 2018-12-17 LAB — BASIC METABOLIC PANEL
Anion gap: 12 (ref 5–15)
BUN: 7 mg/dL — ABNORMAL LOW (ref 8–23)
CO2: 30 mmol/L (ref 22–32)
Calcium: 7.7 mg/dL — ABNORMAL LOW (ref 8.9–10.3)
Chloride: 98 mmol/L (ref 98–111)
Creatinine, Ser: 1.02 mg/dL — ABNORMAL HIGH (ref 0.44–1.00)
GFR calc Af Amer: >60 mL/min (ref >60)
GFR calc non Af Amer: 52 mL/min — ABNORMAL LOW (ref >60)
Glucose, Bld: 182 mg/dL — ABNORMAL HIGH (ref 70–99)
Potassium: 3.6 mmol/L (ref 3.5–5.1)
Sodium: 140 mmol/L (ref 135–145)

## 2018-12-17 LAB — TROPONIN I (HIGH SENSITIVITY): Troponin I (High Sensitivity): 16 ng/L (ref <18)

## 2018-12-17 LAB — LEGIONELLA PNEUMOPHILA SEROGP 1 UR AG: L. pneumophila Serogp 1 Ur Ag: NEGATIVE

## 2018-12-17 LAB — MAGNESIUM: Magnesium: 2 mg/dL (ref 1.7–2.4)

## 2018-12-17 MED ORDER — AMIODARONE HCL 200 MG PO TABS
200.0000 mg | ORAL_TABLET | Freq: Two times a day (BID) | ORAL | Status: DC
  Administered 2018-12-17 – 2018-12-21 (×9): 200 mg via ORAL
  Filled 2018-12-17 (×9): qty 1

## 2018-12-17 MED ORDER — TRAMADOL HCL 50 MG PO TABS
50.0000 mg | ORAL_TABLET | Freq: Once | ORAL | Status: AC
  Administered 2018-12-17: 50 mg via ORAL
  Filled 2018-12-17: qty 1

## 2018-12-17 MED ORDER — METOPROLOL SUCCINATE ER 50 MG PO TB24: 75.0000 mg | ORAL_TABLET | Freq: Every day | ORAL | Status: DC

## 2018-12-17 MED ORDER — METOPROLOL TARTRATE 50 MG PO TABS
75.0000 mg | ORAL_TABLET | Freq: Two times a day (BID) | ORAL | Status: DC
  Administered 2018-12-17: 75 mg via ORAL
  Filled 2018-12-17 (×2): qty 1

## 2018-12-17 MED ORDER — SODIUM CHLORIDE 0.9 % IV SOLN
INTRAVENOUS | Status: DC | PRN
  Administered 2018-12-17 – 2018-12-19 (×4): 250 mL via INTRAVENOUS

## 2018-12-17 NOTE — Consult Note (Addendum)
Cardiology Consultation:   Patient ID: CARSEN EMMENDORFER; [id number]; 08-15-1940   Admit date: 12/14/2018 Date of Consult: 12/17/2018  Primary Care Provider: [name], MD Primary Cardiologist: Beverly Dawley, MD Primary Electrophysiologist:  None  Chief Complaint: palpitations, shortness of breath  Patient Profile:   Beverly Tucker is a 78 y.o. 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 Kurtis Tucker.  History of Present Illness:   It has been a very tumultous year for [name]. She was initially seen in the ED 10/05/18 with transient SVT. She was also found to have ulcerations under her right breast with recommendation for close OP f/u. She was then hospitalized 10/14/2018 and found to have a new left pleural effusion and new fungating mass on breast. Thoracentesis showed exudative effusion with adenocarcinoma confirmed to be metastatic breast cancer. She also was found to have bone mets. She had atrial fibrillation with RVR during that hospitalization and was treated with metoprolol and Eliquis (CHADSVASC of 5). She was seen in the ED 10/2018 with worsening SOB, edema, and palpitations. In the ED she was found to have SVT with heart rate up to the 180s which converted to NSR with adenosine. Chest x-ray showed small bilateral pleural effusions and somewhat new hazy opacity. She was started on empiric azithromycin. She was admitted and treated for acute on chronic diastolic heart failure and treated with IV diuresis. Metoprolol was adjusted due to recurrent episodes of SVT while admitted. 2D echo 11/21/18 showed normal EF. She has been treated with abemaciclib, letrozole, and Xgeva by oncology.  She presented back to the  hospital 12/14/18 with sudden onset of SOB and tachycardia. She was reported to be in SVT by EMS with associated hypotension. She spontaneously converted to NSR. CT angio showed no PE but did show patchy nodularity and consolidative changes suggestive of superimposed pneumonia, stable pleural fluid volume. She was subsequently admitted for HCAP. hsTroponins 43->35->16, BNP 115. During her admission she was also felt to be mildly volume overloaded and was treated with IV Lasix x 2 doses but now written to resume home oral dose. Labs otherwise notable for albumin 2.5, last Cr 1.02, K 3.6, Mg 2.0, Hgb 9.8 (9.8-10.8), Covid neg. She had another paroxysm of SVT this morning with HR 130s-150s. Per her report, she is only really specifically symptomatic while out of rhythm with dyspnea/palpitations. Once she converts she always feels much better. She denies any chest pain or edema. Repeat echo 11/15 showed EF 60-65%, grade 1 DD, otherwise unrevealing. She is in NSR during our evaluation.   Past Medical History:  Diagnosis Date  . Anemia   . Cancer (HCC)    Breast   . Chronic diastolic CHF (congestive heart failure) ([place])   . CKD (chronic kidney disease), stage III   . GERD (gastroesophageal reflux disease)   . Hypertension   . Malignant pleural effusion   . Metastatic breast cancer ([place])   . PAF (paroxysmal atrial fibrillation) ([place])   . PSVT (paroxysmal supraventricular tachycardia) ([place])     Past Surgical History:  Procedure Laterality Date  . CHOLECYSTECTOMY    . IR PERC PLEURAL DRAIN W/INDWELL CATH W/IMG GUIDE  11/08/2018  . IR THORACENTESIS ASP PLEURAL SPACE W/IMG GUIDE  10/22/2018  Inpatient Medications: Scheduled Meds: . abemaciclib  50 mg Oral q1800  . amiodarone  200 mg Oral BID  . apixaban  5 mg Oral BID  . furosemide  40 mg Oral Daily  . metoprolol tartrate  75 mg Oral BID  . ondansetron (ZOFRAN) IV  4 mg Intravenous Q6H  . Vitamin D (Ergocalciferol)  50,000 Units Oral Weekly    Continuous Infusions: . azithromycin 500 mg (12/16/18 2001)  . cefTRIAXone (ROCEPHIN)  IV 2 g (12/16/18 1737)   PRN Meds: acetaminophen, ALPRAZolam, loperamide, loratadine, sodium chloride  Home Meds: Prior to Admission medications   Medication Sig Start Date End Date Taking? Authorizing Provider  abemaciclib (VERZENIO) 50 MG tablet Take 1 tablet (50 mg total) by mouth 2 (two) times daily. Swallow tablets whole. Do not chew, crush, or split tablets before swallowing. Patient taking differently: Take 50 mg by mouth daily. Swallow tablets whole. Do not chew, crush, or split tablets before swallowing. 12/09/18  Yes Beverly Genera, MD  ALPRAZolam Duanne Moron) 0.5 MG tablet Take 0.25-0.5 mg by mouth 2 (two) times daily as needed for anxiety. 05/31/17  Yes [provider]  apixaban (ELIQUIS) 5 MG TABS tablet Take 1 tablet (5 mg total) by mouth 2 (two) times daily. 10/23/18  Yes Beverly Hubert, MD  ergocalciferol (VITAMIN D2) 1.25 MG (50000 UT) capsule Take 1 capsule (50,000 Units total) by mouth once a week. 11/04/18  Yes Beverly Genera, MD  furosemide (LASIX) 40 MG tablet Take 1 tablet (40 mg total) by mouth daily. 11/23/18  Yes Beverly Phi, DO  loperamide (IMODIUM) 2 MG capsule Take 1 capsule (2 mg total) by mouth as needed for diarrhea or loose stools. 11/24/18  Yes Beverly Phi, DO  metoprolol tartrate 75 MG TABS Take 75 mg by mouth 2 (two) times daily. 11/22/18 12/22/18 Yes Beverly Phi, DO  ondansetron (ZOFRAN ODT) 4 MG disintegrating tablet Take 1 tablet (4 mg total) by mouth every 8 (eight) hours as needed for nausea or vomiting. 11/24/18  Yes Beverly Phi, DO  letrozole [facility]) 2.5 MG tablet Take 1 tablet (2.5 mg total) by mouth daily. Patient not taking: Reported on 12/14/2018 12/09/18   Beverly Genera, MD    Allergies:   No Known Allergies  Social History:   Social History   Socioeconomic History  . Marital status: Widowed    Spouse name: Not on file   . Number of children: Not on file  . Years of education: Not on file  . Highest education level: Not on file  Occupational History  . Not on file  Social Needs  . Financial resource strain: Not on file  . Food insecurity    Worry: Not on file    Inability: Not on file  . Transportation needs    Medical: Not on file    Non-medical: Not on file  Tobacco Use  . Smoking status: Never Smoker  . Smokeless tobacco: Never Used  Substance and Sexual Activity  . Alcohol use: Never    Frequency: Never  . Drug use: Never  . Sexual activity: Not on file  Lifestyle  . Physical activity    Days per week: Not on file    Minutes per session: Not on file  . Stress: Not on file  Relationships  . Social Herbalist on phone: Not on file    Gets together: Not on file    Attends religious service: Not on file    Active member of  club or organization: Not on file    Attends meetings of clubs or organizations: Not on file    Relationship status: Not on file  . Intimate partner violence    Fear of current or ex partner: Not on file    Emotionally abused: Not on file    Physically abused: Not on file    Forced sexual activity: Not on file  Other Topics Concern  . Not on file  Social History Narrative  . Not on file     Family History:    Family History  Problem Relation Age of Onset  . Diabetes Mellitus II Other        son      ROS:  Please see the history of present illness.  + poor appetite. All other ROS reviewed and negative.     Physical Exam/Data:   Vitals:   12/17/18 0846 12/17/18 0901 12/17/18 0919 12/17/18 1001  BP: 106/78 112/78 118/86 134/85  Pulse: (!) 144 (!) 138 (!) 138 75  Resp:      Temp:      TempSrc:      SpO2: 100% 100% 100%   Weight:      Height:        Intake/Output Summary (Last 24 hours) at 12/17/2018 1137 Last data filed at 12/17/2018 1008 Gross per 24 hour  Intake 1315.28 ml  Output 800 ml  Net 515.28 ml   Last 3 Weights  12/17/2018 12/16/2018 12/15/2018  Weight (lbs) 175 lb 12.8 oz 176 lb 178 lb 2.1 oz  Weight (kg) 79.742 kg 79.833 kg 80.8 kg     Body mass index is 28.37 kg/m.  General: Well developed, well nourished, in no acute distress. Head: Normocephalic, atraumatic, sclera non-icteric, no xanthomas, nares are without discharge.  Neck: Negative for carotid bruits. JVD not elevated. Lungs: Clear bilaterally to auscultation without wheezes, rales, or rhonchi. Breathing is unlabored. Heart: RRR with S1 S2. No murmurs, rubs, or gallops appreciated. Abdomen: Soft, non-tender, non-distended with normoactive bowel sounds. No hepatomegaly. No rebound/guarding. No obvious abdominal masses. Msk:  Strength and tone appear normal for age. Extremities: No clubbing or cyanosis. No edema.  Distal pedal pulses are 2+ and equal bilaterally. Neuro: Alert and oriented X 3. No facial asymmetry. No focal deficit. Moves all extremities spontaneously. Psych:  Responds to questions appropriately with a normal affect.   EKG:  The EKG was personally reviewed and demonstrates:  NSR 60bpm 1st degree AVB nonspecific TW changes  Telemetry:  Telemetry was personally reviewed and demonstrates:  NSR with narrow complex SVT HR 130s-150s this AM  Relevant CV Studies: Most recent pertinent cardiac studies are outlined above.   Laboratory Data:  High Sensitivity Troponin:   Recent Labs  Lab 12/14/18 1325 12/14/18 1542 12/15/18 0421 12/15/18 0711 12/17/18 0812  TROPONINIHS 42* 49* 43* 35* 16     Cardiac EnzymesNo results for input(s): TROPONINI in the last 168 hours. No results for input(s): TROPIPOC in the last 168 hours.  Chemistry Recent Labs  Lab 12/15/18 0421 12/16/18 0545 12/17/18 0407  NA 143 143 140  K 2.9* 3.3* 3.6  CL 100 103 98  CO2 30 33* 30  GLUCOSE 128* 116* 182*  BUN 9 8 7*  CREATININE 1.11* 0.97 1.02*  CALCIUM 7.6* 7.2* 7.7*  GFRNONAA 47* 56* 52*  GFRAA 55* >60 >60  ANIONGAP 13 7 12     Recent  Labs  Lab 12/15/18 0421  PROT 5.6*  ALBUMIN 2.5*  AST 15  ALT 16  ALKPHOS 79  BILITOT 0.4   Hematology Recent Labs  Lab 12/14/18 1325 12/15/18 0421  WBC 9.5 9.1  RBC 3.72* 3.41*  HGB 10.8* 9.8*  HCT 35.9* 32.7*  MCV 96.5 95.9  MCH 29.0 28.7  MCHC 30.1 30.0  RDW 15.8* 15.8*  PLT 504* 442*   BNP Recent Labs  Lab 12/14/18 1325 12/16/18 0545  BNP 279.8* 115.4*    DDimer No results for input(s): DDIMER in the last 168 hours.   Radiology/Studies:  Dg Chest 2 View  Result Date: 12/14/2018 CLINICAL DATA:  Shortness of breath EXAM: CHEST - 2 VIEW COMPARISON:  11/19/2018 FINDINGS: Cardiomegaly. Unchanged small bilateral pleural effusions and associated atelectasis or consolidation with a left-sided tunneled pleural drainage catheter. No new airspace opacity. The visualized skeletal structures are unremarkable. IMPRESSION: 1. Unchanged small bilateral pleural effusions and associated atelectasis or consolidation with a left-sided tunneled pleural drainage catheter. No new airspace opacity. 2.  Cardiomegaly. Electronically Signed   By: Eddie Candle M.D.   On: 12/14/2018 14:28   Cta Chest For Pe  Result Date: 12/14/2018 CLINICAL DATA:  Dyspnea, recent hospitalization. History of adenocarcinoma and malignant effusion. EXAM: CT ANGIOGRAPHY CHEST WITH CONTRAST TECHNIQUE: Multidetector CT imaging of the chest was performed using the standard protocol during bolus administration of intravenous contrast. Multiplanar CT image reconstructions and MIPs were obtained to evaluate the vascular anatomy. CONTRAST:  3mL OMNIPAQUE IOHEXOL 350 MG/ML SOLN COMPARISON:  Multiple priors including CT from 10/16/2018 and chest x-rays from October and today. FINDINGS: Cardiovascular: Heart size is stable with signs of right heart enlargement and engorgement of central pulmonary vasculature. Main pulmonary artery at 3.5 cm. No signs of pulmonary embolism. Basilar vascular assessment limited by respiratory  motion. Aorta is of normal caliber. Mediastinum/Nodes: No signs of adenopathy in the chest. Lungs/Pleura: Interval placement of a PleurX catheter in the left posterior chest. Loculated, known malignant effusion in the left chest with increased fissural component compared to previous study but reduced posterior component. Overall volume of fluid is similar compared to previous exam. Increased pleural fluid however in the right chest compared to the prior study. Patchy areas of nodularity in the right mid chest. No signs of pneumothorax. Upper Abdomen: Incidental imaging of upper abdominal contents shows no acute abnormality. Musculoskeletal: Known fungating mass associated with the right breast is redemonstrated. Thickness and nodularity may be diminished compared to the prior study measuring approximately 1 cm in greatest thickness where there are areas that previously measured 1.4-1.5 cm. The significance is uncertain. There is an obvious soft tissue defect in this location. Multifocal areas of bony sclerosis and scattered areas of lytic change period. Sclerotic lesions scattered about the thoracic spine compatible with metastatic disease. Example of lytic in subtly expansile lesions in the ribs best seen on sagittal image 24 of series 9 in the right lateral fourth rib. There also subacute fractures in inferior ribs along the right chest. Review of the MIP images confirms the above findings. IMPRESSION: 1. Patchy areas of nodularity in the right chest and some basilar consolidative changes on the right raising the question of superimposed pneumonia. 2. No pulmonary embolism identified. 3. Interval placement of a PleurX catheter in the left posterior chest. Overall volume of fluid in the left chest similar compared to the prior study. 4. Pleural fluid in the right chest compared to the prior study. 5. Known fungating mass associated with the right breast is redemonstrated. Thickness and nodularity may be slightly  diminished. 6. Bony  metastatic disease as above. Aortic Atherosclerosis (ICD10-I70.0). Electronically Signed   By: Zetta Bills M.D.   On: 12/14/2018 17:26    Assessment and Plan:   1. Symptomatic PSVT - titration of metoprolol has not really made a significant difference for suppression of her SVT. She has not had recurrent Afib on this, however. Her blood pressure flucutates but occasionally runs low-normal at times. Discussed option of diltiazem vs amiodarone with Dr. Acie Fredrickson. Per our discussion, would continue Lopressor at present dose and plan to initiate oral amiodarone 200mg  BID. Recent TSH normal. The benefits of using amiodarone to maintain NSR outweigh the risks given that she keeps requiring readmission for this issue. Follow on telemetry for now.  2. Paroxysmal atrial fibrillation - no recurrence this admission. Continue metoprolol and Eliquis.  3. Chronic diastolic CHF - patient appears euvolemic. Per orders, primary team held home Lasix today. When this is resumed, she would benefit from standing potassium with it.  4. HCAP in the context of metastatic breast CA - per primary team.  5. Mildly elevated troponin - suspect related to demand ischemia. Her prior EKGs in 10/2018 did show significant rate-related ST depression. Fortunately she does not have any angina while in normal rhythm. Given comorbidities and lack of typical angina, plan to focus on maintenance of NSR - do not anticipate further ischemic workup at this time.  For questions or updates, please contact [place] Please consult [email or web address] for contact info under   Signed, Charlie Pitter, PA-C  12/17/2018 11:37 AM   Attending Note:   The patient was seen and examined.  Agree with assessment and plan as noted above.  Changes made to the above note as needed.  Patient seen and independently examined with  Melina Copa, PA .   We discussed all aspects of the encounter. I agree with the assessment and plan as stated  above.  1.     SVT:   Very symptomatic.  Is on metoprolol but is having breakthrough SVT.   BP is a bit soft to consider adding diltiazem.   Will add amiodarone to her metoprolol.  Will start at Amio 200 mg PO BID .  This should hopefully help keep her out of sVT and out of the ER  .  2. Chronic diastolic CHF:   Appears euvolemic.     Mild troponin elevation :   Likely rate related.  No additional ischemic work up indicated at this time    I have spent a total of 40 minutes with patient reviewing hospital  notes , telemetry, EKGs, labs and examining patient as well as establishing an assessment and plan that was discussed with the patient. > 50% of time was spent in direct patient care.    Thayer Headings, Brooke Bonito., MD, [facility] 12/17/2018, 1:30 PM [address],  [place] Pager [phone]

## 2018-12-17 NOTE — Progress Notes (Signed)
    Vital Signs MEWS/VS Documentation      12/17/2018 0830 12/17/2018 0834 12/17/2018 0846 12/17/2018 0901   MEWS Score:  3  3  3  3    MEWS Score Color:  Yellow  Yellow  Yellow  Yellow   Resp:  20  -  -  -   Pulse:  (!) 161  (!) 158  (!) 144  (!) 138   BP:  122/82  103/72  106/78  112/78   Temp:  (!) 97.5 F (36.4 C)  -  -  -   O2 Device:  Nasal Cannula  -  -  -   O2 Flow Rate (L/min):  4 L/min  -  -  -           Beverly Tucker 12/17/2018,9:03 AM

## 2018-12-17 NOTE — Progress Notes (Signed)
OT Cancellation Note  Patient Details Name: Beverly Tucker MRN: [id number] DOB: [birth year]   Cancelled Treatment:    Reason Eval/Treat Not Completed: Medical issues which prohibited therapy. Pt with elevated HR at resting and RN requested therapy hold today. OT will follow up as pt is appropriate.  Gypsy Decant MS, OTR/L 12/17/2018, 9:41 AM

## 2018-12-17 NOTE — Progress Notes (Signed)
PROGRESS NOTE    Beverly Tucker  [id number] DOB: 05/18/1940 DOA: 12/14/2018 PCP: [name], MD    Brief Narrative:  Beverly Tucker a 78 y.o.femalewith medical history significant of Hypertension GERD anxiety metastatic breast cancer on oral chemotherapy, diastolic heart failure, atrial fibrillation on Eliquis, SVT, recurrent malignant left pleural effusion status post Pleurx, pneumothorax,andCKD 3,pt presented withincreasing shortness of breath and palpitations which has beenintermittent over the last 48hours.Per EMS pt found with SVT 140's and bp 90/40., then has been in SR. Has been given lasix po and iv for volume overload, but has improved. Also being tx for Pneumonia. Covid was negative.     Consultants:  cardiology  Procedures: CT chest: 1. Patchy areas of nodularity in the right chest and some basilar consolidative changes on the right raising the question of superimposed pneumonia. 2. No pulmonary embolism identified. 3. Interval placement of a PleurX catheter in the left posterior chest. Overall volume of fluid in the left chest similar compared to the prior study. 4. Pleural fluid in the right chest compared to the prior study. 5. Known fungating mass associated with the right breast is redemonstrated. Thickness and nodularity may be slightly diminished. 6. Bony metastatic disease as above.  Echo: 1. Left ventricular ejection fraction, by visual estimation, is 60 to 65%. The left ventricle has normal function. There is no left ventricular hypertrophy. 2. Left ventricular diastolic parameters are consistent with Grade I diastolic dysfunction (impaired relaxation). 3. Global right ventricle has normal systolic function.The right ventricular size is normal. No increase in right ventricular wall thickness. 4. Left atrial size was normal. 5. Right atrial size was normal. 6. Severe mitral annular calcification. 7. The mitral valve is normal  in structure. Trace mitral valve regurgitation. No evidence of mitral stenosis. 8. The tricuspid valve is normal in structure. Tricuspid valve regurgitation is mild. 9. The aortic valve is normal in structure. Aortic valve regurgitation is trivial. Mild aortic valve sclerosis without stenosis. 10. The pulmonic valve was normal in structure. Pulmonic valve regurgitation is not visualized. 11. The inferior vena cava is normal in size with greater than 50% respiratory variability, suggesting right atrial pressure of 3 mmHg.   Antimicrobials:  ceftrixone and azithromycin     Subjective: This am pt was tachycardic, appeared sob looking at her but she denies it. Tele 150 NCT. Given her beta blk. Denies cp. Reports being tired/  Objective: Vitals:   12/16/18 2027 12/17/18 0028 12/17/18 0431 12/17/18 0647  BP: 130/64 (!) 162/100 (!) 150/70   Pulse: 71 86 77   Resp: 18 20 (!) 22   Temp: 98.5 F (36.9 C) 98.8 F (37.1 C) 98.4 F (36.9 C)   TempSrc: Oral Oral Oral   SpO2: 98% 98% 97%   Weight:    79.7 kg  Height:        Intake/Output Summary (Last 24 hours) at 12/17/2018 0732 Last data filed at 12/17/2018 0000 Gross per 24 hour  Intake 1195.28 ml  Output 600 ml  Net 595.28 ml   Filed Weights   12/15/18 0553 12/16/18 0500 12/17/18 0647  Weight: 80.8 kg 79.8 kg 79.7 kg    Examination: General exam: appears mildly sob, sitting up in bed. NAD, appears fatigued. Respiratory system: CTA with decrease bs at bases, no w/r/r Cardiovascular system: tachycardic, S1 & S2 heard, , no murmurs, or gallops Gastrointestinal system: abd soft, nt/nd +bs Central nervous system: Alert and oriented x3 . No focal neurological deficits. Extremities: no edema, no  cyanosis Skin: warm, dry Psychiatry: Mood & affect appropriate.     Data Reviewed: I have personally reviewed following labs and imaging studies  CBC: Recent Labs  Lab 12/14/18 1325 12/15/18 0421  WBC 9.5 9.1  HGB 10.8*  9.8*  HCT 35.9* 32.7*  MCV 96.5 95.9  PLT 504* [id number]*   Basic Metabolic Panel: Recent Labs  Lab 12/14/18 1325 12/15/18 0421 12/16/18 0545 12/17/18 0407  NA 141 143 143 140  K 3.0* 2.9* 3.3* 3.6  CL 96* 100 103 98  CO2 32 30 33* 30  GLUCOSE 147* 128* 116* 182*  BUN 10 9 8  7*  CREATININE 1.36* 1.11* 0.97 1.02*  CALCIUM 7.8* 7.6* 7.2* 7.7*  MG 2.1  --   --   --    GFR: Estimated Creatinine Clearance: 47.7 mL/min (A) (by C-G formula based on SCr of 1.02 mg/dL (H)). Liver Function Tests: Recent Labs  Lab 12/15/18 0421  AST 15  ALT 16  ALKPHOS 79  BILITOT 0.4  PROT 5.6*  ALBUMIN 2.5*   No results for input(s): LIPASE, AMYLASE in the last 168 hours. No results for input(s): AMMONIA in the last 168 hours. Coagulation Profile: No results for input(s): INR, PROTIME in the last 168 hours. Cardiac Enzymes: No results for input(s): CKTOTAL, CKMB, CKMBINDEX, TROPONINI in the last 168 hours. BNP (last 3 results) No results for input(s): PROBNP in the last 8760 hours. HbA1C: No results for input(s): HGBA1C in the last 72 hours. CBG: No results for input(s): GLUCAP in the last 168 hours. Lipid Profile: No results for input(s): CHOL, HDL, LDLCALC, TRIG, CHOLHDL, LDLDIRECT in the last 72 hours. Thyroid Function Tests: No results for input(s): TSH, T4TOTAL, FREET4, T3FREE, THYROIDAB in the last 72 hours. Anemia Panel: No results for input(s): VITAMINB12, FOLATE, FERRITIN, TIBC, IRON, RETICCTPCT in the last 72 hours. Sepsis Labs: No results for input(s): PROCALCITON, LATICACIDVEN in the last 168 hours.  Recent Results (from the past 240 hour(s))  SARS CORONAVIRUS 2 (TAT 6-24 HRS) Nasopharyngeal Nasopharyngeal Swab     Status: None   Collection Time: 12/14/18  5:47 PM   Specimen: Nasopharyngeal Swab  Result Value Ref Range Status   SARS Coronavirus 2 NEGATIVE NEGATIVE Final    Comment: (NOTE) SARS-CoV-2 target nucleic acids are NOT DETECTED. The SARS-CoV-2 RNA is generally  detectable in upper and lower respiratory specimens during the acute phase of infection. Negative results do not preclude SARS-CoV-2 infection, do not rule out co-infections with other pathogens, and should not be used as the sole basis for treatment or other patient management decisions. Negative results must be combined with clinical observations, patient history, and epidemiological information. The expected result is Negative. Fact Sheet for Patients: SugarRoll.be Fact Sheet for Healthcare Providers: https://www.woods-mathews.com/ This test is not yet approved or cleared by the [place] FDA and  has been authorized for detection and/or diagnosis of SARS-CoV-2 by FDA under an Emergency Use Authorization (EUA). This EUA will remain  in effect (meaning this test can be used) for the duration of the COVID-19 declaration under Section 56 4(b)(1) of the Act, 21 U.S.C. section 360bbb-3(b)(1), unless the authorization is terminated or revoked sooner. Performed at [facility], [place] [address], [place], [place] [address]   Culture, blood (routine x 2) Call MD if unable to obtain prior to antibiotics being given     Status: None (Preliminary result)   Collection Time: 12/14/18 10:22 PM   Specimen: BLOOD  Result Value Ref Range Status  Specimen Description BLOOD LEFT ANTECUBITAL  Final   Special Requests   Final    BOTTLES DRAWN AEROBIC AND ANAEROBIC Blood Culture results may not be optimal due to an inadequate volume of blood received in culture bottles   Culture   Final    NO GROWTH 2 DAYS Performed at [facility], [place] [address], [place], [place] [address]    Report Status PENDING  Incomplete  Culture, blood (routine x 2) Call MD if unable to obtain prior to antibiotics being given     Status: None (Preliminary result)   Collection Time: 12/14/18 10:31 PM   Specimen: BLOOD  Result Value Ref Range Status   Specimen  Description BLOOD LEFT ANTECUBITAL  Final   Special Requests   Final    BOTTLES DRAWN AEROBIC AND ANAEROBIC Blood Culture adequate volume   Culture   Final    NO GROWTH 2 DAYS Performed at [facility], [address] [place], [place] [address]    Report Status PENDING  Incomplete  Respiratory Panel by PCR     Status: None   Collection Time: 12/14/18 10:35 PM   Specimen: Nasopharyngeal Swab; Respiratory  Result Value Ref Range Status   Adenovirus NOT DETECTED NOT DETECTED Final   Coronavirus 229E NOT DETECTED NOT DETECTED Final    Comment: (NOTE) The Coronavirus on the Respiratory Panel, DOES NOT test for the novel  Coronavirus (2019 nCoV)    Coronavirus HKU1 NOT DETECTED NOT DETECTED Final   Coronavirus NL63 NOT DETECTED NOT DETECTED Final   Coronavirus OC43 NOT DETECTED NOT DETECTED Final   Metapneumovirus NOT DETECTED NOT DETECTED Final   Rhinovirus / Enterovirus NOT DETECTED NOT DETECTED Final   Influenza A NOT DETECTED NOT DETECTED Final   Influenza B NOT DETECTED NOT DETECTED Final   Parainfluenza Virus 1 NOT DETECTED NOT DETECTED Final   Parainfluenza Virus 2 NOT DETECTED NOT DETECTED Final   Parainfluenza Virus 3 NOT DETECTED NOT DETECTED Final   Parainfluenza Virus 4 NOT DETECTED NOT DETECTED Final   Respiratory Syncytial Virus NOT DETECTED NOT DETECTED Final   Bordetella pertussis NOT DETECTED NOT DETECTED Final   Chlamydophila pneumoniae NOT DETECTED NOT DETECTED Final   Mycoplasma pneumoniae NOT DETECTED NOT DETECTED Final    Comment: Performed at [facility], [place] [address] [place], [place] [address]         Radiology Studies: No results found.      Scheduled Meds: . abemaciclib  50 mg Oral q1800  . apixaban  5 mg Oral BID  . furosemide  40 mg Oral Daily  . metoprolol tartrate  75 mg Oral BID  . ondansetron (ZOFRAN) IV  4 mg Intravenous Q6H  . Vitamin D (Ergocalciferol)  50,000 Units Oral Weekly   Continuous Infusions: . azithromycin  500 mg (12/16/18 2001)  . cefTRIAXone (ROCEPHIN)  IV 2 g (12/16/18 1737)    Assessment & Plan:   Active Problems:   Shortness of breath   Acute on chronic diastolic CHF (congestive heart failure) ([place])   Community acquired pneumonia   Hyperkalemia   Gastroesophageal reflux disease without esophagitis   Hypokalemia  1.HAP -Continue ceftrixone and azithromycin to complete total 7 day course -Respiratory panel negative -Blood culturepending -Continue with pulmonary toilet -Supportive care with O2  2.SVT/history of atrial fibrillation -Onapixaban and metoprolol -Had psvt again this am. Cards consulted, started on amidarone    3.Elevated troponin troponin Likely secondaryto demand ischemia from tachycardia -Asymptomatic without chest  pain, even though EKG with rate-related lateral ST depression.  Cards following. Troponins trending down. -Echo with normal EF   4.History of metastatic breast cancer -Wants to take chemo at night, pharmacy checking on dosing . -Notified patient's oncologist about her status as an inpatient   5.Acute on Chronic diastolic heart failure -mildly vol. Overloaded this am.  -Give lasix 40mg  iv x1,, now switched to po . Will place on  low place on low dose kcl standing dose.   6. Essential hypertension -stable on beta blk   7.GERD -ContinuePPI  8.Hypokalemia Resolved Will place  on kcl 70meq daily Monitor labs  9. Consult PT/OT  DVT prophylaxis:Apixaban Code Status:Full code Family Communication: disposition Plan:wil require 1-2 more days until more medically stable.        LOS: 3 days   Time spent: 55 minutes with more than 50% on [place], MD [facility] Pager [phone]  If 7PM-7AM, please contact night-coverage [email or web address] Password [facility] 12/17/2018, 7:32 AM

## 2018-12-17 NOTE — Progress Notes (Signed)
PT Cancellation Note  Patient Details Name: Beverly Tucker MRN: [id number] DOB: 11-29-1940   Cancelled Treatment:    Reason Eval/Treat Not Completed: Medical issues which prohibited therapy pt with tachycardia in 150s at rest. Awaiting cardiology consult. Holding PT this AM. Will follow up once appropriate.   [name] 12/17/2018, 9:43 AM Marisa Severin, PT, DPT Acute Rehabilitation Services Pager [phone] Office [phone]

## 2018-12-17 NOTE — Progress Notes (Signed)
MD paged to primary RN's # due to sustained HR of 156; sinus tach. Patient appears asymptomatic at this time. Awaiting MD orders.

## 2018-12-17 NOTE — TOC Initial Note (Signed)
Transition of Care [facility]) - Initial/Assessment Note    Patient Details  Beverly Tucker: Beverly Tucker MRN: [id number] Date of Birth: September 23, 1940  Transition of Care [facility]) CM/SW Contact:    Beverly Tucker, [place] Phone Number: [phone] 12/17/2018, 4:25 PM  Clinical Narrative:                  CSW spoke with patient regarding discharge planning and SNF recommendation by PT for short term rehab. Patient states "I'm going home". CSW reiterated recommendation, patient reports she will go home with her son Beverly Tucker who provides home support. She was previously set up with home health and reports currently receiving services, however she does not remember through which company.   Per previously notes, patient set up with [facility]. CSW has reached out to [place] with [name] to confirm patient is still active with them and inquire as to which services she has been receiving.   Patient reports no equipment needs at home and states she has all the DME she needs.    Expected Discharge Plan: [place] Barriers to Discharge: Continued Medical Work up   Patient Goals and CMS Choice Patient states their goals for this hospitalization and ongoing recovery are:: to go home CMS Medicare.gov Compare Post Acute Care list provided to:: Patient Choice offered to / list presented to : Patient  Expected Discharge Plan and Services Expected Discharge Plan: [place]   Discharge Planning Services: CM Consult Post Acute Care Choice: [place] arrangements for the past 2 months: Single Family Home                 DME Arranged: (NA)         HH Arranged: NA          Prior Living Arrangements/Services Living arrangements for the past 2 months: Single Family Home Lives with:: Adult Children(son Beverly Tucker) Patient language and need for interpreter reviewed:: Yes Do you feel safe going back to the place where you live?: Yes      Need for Family Participation in Patient Care: Yes  (Comment) Care giver support system in place?: Yes (comment) Current home services: Home PT, Home OT, Home RN Criminal Activity/Legal Involvement Pertinent to Current Situation/Hospitalization: No - Comment as needed  Activities of Daily Living Home Assistive Devices/Equipment: Dentures (specify type), Oxygen ADL Screening (condition at time of admission) Patient's cognitive ability adequate to safely complete daily activities?: Yes Is the patient deaf or have difficulty hearing?: No Does the patient have difficulty seeing, even when wearing glasses/contacts?: No Does the patient have difficulty concentrating, remembering, or making decisions?: No Patient able to express need for assistance with ADLs?: Yes Does the patient have difficulty dressing or bathing?: No Independently performs ADLs?: Yes (appropriate for developmental age) Does the patient have difficulty walking or climbing stairs?: Yes Weakness of Legs: Both Weakness of Arms/Hands: Both  Permission Sought/Granted Permission sought to share information with : Case Manager, Customer service manager, Family Supports Permission granted to share information with : Yes, Verbal Permission Granted  Share Information with Beverly Tucker: Beverly Tucker  Permission granted to share info w AGENCY: home health  Permission granted to share info w Relationship: son  Permission granted to share info w Contact Information: [phone]  Emotional Assessment Appearance:: Appears stated age Attitude/Demeanor/Rapport: Gracious Affect (typically observed): Calm Orientation: : Oriented to Self, Oriented to Place, Oriented to  Time, Oriented to Situation Alcohol / Substance Use: Not Applicable Psych Involvement: No (comment)  Admission diagnosis:  Palpitations [R00.2] Shortness of breath [R06.02] Dyspnea, unspecified type [R06.00] Community acquired pneumonia, unspecified laterality [J18.9] Patient Active Problem List   Diagnosis Date Noted  .  Hypokalemia   . Community acquired pneumonia   . Hyperkalemia   . Gastroesophageal reflux disease without esophagitis   . Malnutrition of moderate degree 11/18/2018  . PSVT (paroxysmal supraventricular tachycardia) ([place]) 11/16/2018  . Acute on chronic diastolic CHF (congestive heart failure) ([place]) 11/16/2018  . Elevated troponin 11/16/2018  . Leukocytosis 11/16/2018  . Atrial fibrillation, chronic ([place]) 11/16/2018  . Malignant pleural effusion   . 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 11/06/2018  . AKI (acute kidney injury) ([place]) 11/06/2018  . AF (paroxysmal atrial fibrillation) ([place]) 11/06/2018  . Pleural effusion 11/06/2018  . Bone metastases ([place]) 11/04/2018  . Metastatic breast cancer ([place])   . Pneumothorax   . Gram-positive bacteremia   . Mass of breast, right 10/15/2018  . Acute CHF (congestive heart failure) ([place]) 10/15/2018  . Shortness of breath 10/14/2018  . CKD (chronic kidney disease) stage 3, GFR 30-59 ml/min 02/06/2018  . HTN (hypertension) 12/01/2015   PCP:  Beverly Sacramento, MD Pharmacy:   [id number] - [place], [place] - [address] [place] [address] Phone: [phone] Fax: [place], [place] - [place] [address] Phone: [phone] Fax: [phone]     Social Determinants of Health (SDOH) Interventions    Readmission Risk Interventions Readmission Risk Prevention Plan 11/24/2018 10/23/2018  Transportation Screening Complete Complete  PCP or Specialist Appt within 5-7 Days - Complete  PCP or Specialist Appt within 3-5 Days Complete -  Home Care Screening - Complete  Medication Review (RN CM) - Complete  HRI or Home Care Consult Complete -  Social Work Consult for [place] Planning/Counseling Complete -  Palliative Care Screening Not Applicable -  Medication Review (RN Care Manager)  Complete -  Some recent data might be hidden

## 2018-12-17 NOTE — Evaluation (Signed)
Physical Therapy Evaluation Patient Details Name: Beverly Tucker MRN: [id number] DOB: Dec 21, 1940 Today's Date: 12/17/2018   History of Present Illness  Patient is a 78 y/o female who presents with SOb and palpitations. Chest CT- patchy areas of nodularity in right chest and basilar consolidate changes concerning for PNA. Admitted with HCAP. Went into SVT but converted back to NSR 11/17. 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    Clinical Impression  Pt in bed upon PT arrival, agreeable to PT session. Pt demos good bed mobility as she was able to come to sitting EOB without physical assistance. Pt benefits from minA for transfers and min guard for ambulation in hallway with use of RW for improved stability. VSS through session and ambulation even with mobility. Pt demos limitations in activity endurance and independence with functional mobility, and will therefore continue to benefit from skilled PT to address aforementioned limitations.     Follow Up Recommendations SNF;Supervision/Assistance - 24 hour    Equipment Recommendations  None recommended by PT    Recommendations for Other Services       Precautions / Restrictions Precautions Precautions: Fall Restrictions Weight Bearing Restrictions: No      Mobility  Bed Mobility Overal bed mobility: Modified Independent Bed Mobility: Supine to Sit     Supine to sit: Modified independent (Device/Increase time)     General bed mobility comments: pt able to maneuver in bed and come to sitting EOB with out physical assist, uses elevated HOB and bed rail.  Transfers Overall transfer level: Needs assistance Equipment used: Rolling walker (2 wheeled) Transfers: Sit to/from Stand Sit to Stand: Min guard         General transfer comment: minG for balance  Ambulation/Gait Ambulation/Gait assistance: Min guard Gait  Distance (Feet): 75 Feet Assistive device: Rolling walker (2 wheeled) Gait Pattern/deviations: Step-through pattern;Decreased stride length;Decreased dorsiflexion - left;Decreased dorsiflexion - right;Trunk flexed Gait velocity: reduced Gait velocity interpretation: <1.31 ft/sec, indicative of household ambulator General Gait Details: Pt with slow, low clearance steps with decreased heel-toe pattern. Pt ambulates with forward trunk flexion and increased WB on RW  Stairs            Wheelchair Mobility    Modified Rankin (Stroke Patients Only)       Balance Overall balance assessment: Needs assistance Sitting-balance support: Feet supported;No upper extremity supported Sitting balance-Leahy Scale: Good Sitting balance - Comments: supervision for sitting EOB   Standing balance support: Bilateral upper extremity supported Standing balance-Leahy Scale: Poor Standing balance comment: pt used RW to ambulate                             Pertinent Vitals/Pain Pain Assessment: No/denies pain    Home Living Family/patient expects to be discharged to:: Private residence Living Arrangements: Alone Available Help at Discharge: Family;Available PRN/intermittently(son lives across the way) Type of Home: House Home Access: Stairs to enter Entrance Stairs-Rails: Can reach both Entrance Stairs-Number of Steps: 3 Home Layout: One level Home Equipment: Honor - 2 wheels;Shower seat;Cane - single point      Prior Function Level of Independence: Needs assistance   Gait / Transfers Assistance Needed: Independent for ambulation  ADL's / Homemaking Assistance Needed: Needs assist with bathing. Dresses self. Does not do cooking/cleaning.  Comments: Son lives next door.     Hand Dominance   Dominant  Hand: Right    Extremity/Trunk Assessment   Upper Extremity Assessment Upper Extremity Assessment: Overall WFL for tasks assessed;Generalized weakness(Pt able to support  herself in bed using BUE, use BUE to reposition, and perform bed mobility without assist.)    Lower Extremity Assessment Lower Extremity Assessment: Overall WFL for tasks assessed(Pt able to complete sit/stand and ambulation with minimal fatigue)    Cervical / Trunk Assessment Cervical / Trunk Assessment: Kyphotic  Communication   Communication: No difficulties  Cognition Arousal/Alertness: Awake/alert Behavior During Therapy: WFL for tasks assessed/performed Overall Cognitive Status: Within Functional Limits for tasks assessed                                        General Comments General comments (skin integrity, edema, etc.): pt on 4 L O2 [place]    Exercises     Assessment/Plan    PT Assessment Patient needs continued PT services  PT Problem List Decreased strength;Decreased mobility;Decreased activity tolerance;Cardiopulmonary status limiting activity;Decreased balance;Pain       PT Treatment Interventions DME instruction;Therapeutic activities;Gait training;Therapeutic exercise;Patient/family education;Stair training;Balance training;Functional mobility training    PT Goals (Current goals can be found in the Care Plan section)  Acute Rehab PT Goals Patient Stated Goal: to go home PT Goal Formulation: With patient Time For Goal Achievement: 12/31/18 Potential to Achieve Goals: Fair    Frequency Min 2X/week   Barriers to discharge        Co-evaluation               AM-PAC PT "6 Clicks" Mobility  Outcome Measure Help needed turning from your back to your side while in a flat bed without using bedrails?: None Help needed moving from lying on your back to sitting on the side of a flat bed without using bedrails?: None Help needed moving to and from a bed to a chair (including a wheelchair)?: A Little Help needed standing up from a chair using your arms (e.g., wheelchair or bedside chair)?: A Little Help needed to walk in hospital room?: A  Little Help needed climbing 3-5 steps with a railing? : A Lot 6 Click Score: 19    End of Session Equipment Utilized During Treatment: Oxygen;Gait belt Activity Tolerance: Patient limited by fatigue Patient left: in chair;with call bell/phone within reach;with chair alarm set Nurse Communication: Mobility status PT Visit Diagnosis: Unsteadiness on feet (R26.81);Muscle weakness (generalized) (M62.81)    Time: [id number] PT Time Calculation (min) (ACUTE ONLY): 29 min   Charges:   PT Evaluation $PT Eval Moderate Complexity: 1 Mod PT Treatments $Gait Training: 8-22 mins        Mickey Farber, PT, DPT   Acute Rehabilitation Department [phone]  Otho Bellows 12/17/2018, 4:07 PM

## 2018-12-17 NOTE — Progress Notes (Signed)
    Vital Signs MEWS/VS Documentation      12/17/2018 0028 12/17/2018 0431 12/17/2018 0800 12/17/2018 0830   MEWS Score:  0  1  4  3    MEWS Score Color:  Green  Green  Red  Yellow   Resp:  20  (!) 22  -  20   Pulse:  86  77  (!) 160  (!) 161   BP:  (!) 162/100  (!) 150/70  (!) 152/118  122/82   Temp:  98.8 F (37.1 C)  98.4 F (36.9 C)  -  (!) 97.5 F (36.4 C)   O2 Device:  Nasal Cannula  Nasal Cannula  Nasal Cannula  Nasal Cannula   O2 Flow Rate (L/min):  -  -  2 L/min  4 L/min         Unit MEWS score protocol in place.   Julianne Handler 12/17/2018,8:32 AM   MD aware. Pt has history of  A-Fib on apixaban and metoprolol.

## 2018-12-17 NOTE — Progress Notes (Signed)
  Per CCMD pt had a sustained v-tach ;HR 156 sustained. Upon assessment pt stated feeling short of breath. Vitals are as follow:    12/17/18 0800  Vitals  BP (!) 152/118  MAP (mmHg) 127  BP Method Automatic  Pulse Rate (!) 160  Pulse Rate Source Monitor  Oxygen Therapy  SpO2 93 %  O2 Device Nasal Cannula  O2 Flow Rate (L/min) 2 L/min  MEWS Score  MEWS RR 1  MEWS Pulse 3  MEWS Systolic 0  MEWS LOC 0  MEWS Temp 0  MEWS Score 4  MEWS Score Color Red   EKG was completed and gave her metoprolol 75 mg dose scheduled this morning.  Troponin lab verbal orders place for STAT troponin lab drawn once and then once more in 3 hours. Magnesium lab drawn lab verbal order placed.  Amery MD in pts room and aware of pts sustained v-tach.     Lab is currently in pts room getting blood work for troponin and mg.   Pt is in 2L nasal canula 95% oxygen saturation.  Nurse is currently in pts room.

## 2018-12-18 ENCOUNTER — Other Ambulatory Visit: Payer: Medicare HMO

## 2018-12-18 ENCOUNTER — Ambulatory Visit: Payer: Medicare HMO | Admitting: Hematology

## 2018-12-18 DIAGNOSIS — R0600 Dyspnea, unspecified: Secondary | ICD-10-CM

## 2018-12-18 LAB — BASIC METABOLIC PANEL WITH GFR
Anion gap: 9 (ref 5–15)
BUN: 9 mg/dL (ref 8–23)
CO2: 31 mmol/L (ref 22–32)
Calcium: 7.9 mg/dL — ABNORMAL LOW (ref 8.9–10.3)
Chloride: 98 mmol/L (ref 98–111)
Creatinine, Ser: 0.89 mg/dL (ref 0.44–1.00)
GFR calc Af Amer: >60 mL/min (ref >60)
GFR calc non Af Amer: >60 mL/min (ref >60)
Glucose, Bld: 118 mg/dL — ABNORMAL HIGH (ref 70–99)
Potassium: 3.9 mmol/L (ref 3.5–5.1)
Sodium: 138 mmol/L (ref 135–145)

## 2018-12-18 MED ORDER — POTASSIUM CHLORIDE CRYS ER 20 MEQ PO TBCR
20.0000 meq | EXTENDED_RELEASE_TABLET | Freq: Every day | ORAL | Status: DC
  Administered 2018-12-18 – 2018-12-21 (×4): 20 meq via ORAL
  Filled 2018-12-18 (×4): qty 1

## 2018-12-18 MED ORDER — POTASSIUM CHLORIDE CRYS ER 20 MEQ PO TBCR: 20.0000 meq | EXTENDED_RELEASE_TABLET | Freq: Once | ORAL | Status: DC

## 2018-12-18 NOTE — Progress Notes (Signed)
RN drained closed system to gravity, to pleurex canister.  Pt tolerated well.  No drainage or foul odor noted during this time.  Output 550cc.  Catheter clamped and new dressing applied.

## 2018-12-18 NOTE — TOC Progression Note (Signed)
Transition of Care [facility]) - Progression Note    Patient Details  Name: Beverly Tucker MRN: [id number] Date of Birth: Aug 11, 1940  Transition of Care [facility]) CM/SW [place], [place] Phone Number: [phone] 12/18/2018, 9:20 AM  Clinical Narrative:     [name] has confirmed patient is active with them for RN, PT and OT. No further dc needs identified, [name] will resume services at discharge. Home Health order for RN, PT and OT will need to be input prior to patient's discharge.   Expected Discharge Plan: [place] Barriers to Discharge: Continued Medical Work up  Expected Discharge Plan and Services Expected Discharge Plan: [place]   Discharge Planning Services: CM Consult Post Acute Care Choice: [place] arrangements for the past 2 months: Single Family Home                 DME Arranged: (NA)         HH Arranged: NA           Social Determinants of Health (SDOH) Interventions    Readmission Risk Interventions Readmission Risk Prevention Plan 11/24/2018 10/23/2018  Transportation Screening Complete Complete  PCP or Specialist Appt within 5-7 Days - Complete  PCP or Specialist Appt within 3-5 Days Complete -  Home Care Screening - Complete  Medication Review (RN CM) - Complete  HRI or Home Care Consult Complete -  Social Work Consult for [place] Planning/Counseling Complete -  Palliative Care Screening Not Applicable -  Medication Review Press photographer) Complete -  Some recent data might be hidden

## 2018-12-18 NOTE — Evaluation (Signed)
Occupational Therapy Evaluation Patient Details Name: Beverly Tucker MRN: [id number] DOB: February 11, 1940 Today's Date: 12/18/2018    History of Present Illness Patient is a 78 y/o female who presents with SOb and palpitations. Chest CT- patchy areas of nodularity in right chest and basilar consolidate changes concerning for PNA. Admitted with HCAP. Went into SVT but converted back to NSR 11/17. 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   Clinical Impression   Pt lives alone and family assists by supervising showering and providing heavy meals and housekeeping. Pt presents with decreased activity tolerance, but demonstrated ability to use BSC modified independently and groom with set up. She generally needs set up for ADL. Pt with Sp02 of 100% on 2L 02 at end of session. Will follow acutely to address endurance.   Follow Up Recommendations  Home health OT    Equipment Recommendations  None recommended by OT    Recommendations for Other Services       Precautions / Restrictions Precautions Precautions: Fall Restrictions Weight Bearing Restrictions: No      Mobility Bed Mobility Overal bed mobility: Independent             General bed mobility comments: HOB flat  Transfers Overall transfer level: Modified independent Equipment used: None                  Balance Overall balance assessment: Needs assistance   Sitting balance-Leahy Scale: Good     Standing balance support: No upper extremity supported Standing balance-Leahy Scale: Fair                             ADL either performed or assessed with clinical judgement   ADL Overall ADL's : Needs assistance/impaired Eating/Feeding: Independent   Grooming: Wash/dry hands;Wash/dry face;Sitting;Set up   Upper Body Bathing: Set up;Sitting   Lower Body Bathing: Set up;Sit to/from stand    Upper Body Dressing : Set up;Sitting   Lower Body Dressing: Set up;Sit to/from stand   Toilet Transfer: Modified Independent;Stand-pivot;BSC   Toileting- Clothing Manipulation and Hygiene: Set up;Sitting/lateral lean         General ADL Comments: pt on 6L upon arrival with sats a 100%, pt is typically on 2L at home, turned 02 down to 2l with pt remaining at 100%     Vision Baseline Vision/History: Wears glasses Wears Glasses: At all times Patient Visual Report: No change from baseline       Perception     Praxis      Pertinent Vitals/Pain Pain Assessment: No/denies pain     Hand Dominance Right   Extremity/Trunk Assessment         Cervical / Trunk Assessment Cervical / Trunk Assessment: Kyphotic   Communication Communication Communication: No difficulties   Cognition Arousal/Alertness: Awake/alert Behavior During Therapy: WFL for tasks assessed/performed Overall Cognitive Status: Within Functional Limits for tasks assessed                                     General Comments       Exercises     Shoulder Instructions      Home Living Family/patient expects to be discharged to:: Private residence Living Arrangements: Alone Available Help at Discharge: Family;Available PRN/intermittently(son lives nearby) Type of Home: House  Home Access: Stairs to enter [employer] of Steps: 3 Entrance Stairs-Rails: Can reach both Home Layout: One level     Bathroom Shower/Tub: Teacher, early years/pre: Standard     Home Equipment: Environmental consultant - 2 wheels;Shower seat;Cane - single point          Prior Functioning/Environment Level of Independence: Needs assistance  Gait / Transfers Assistance Needed: Independent for ambulation ADL's / Homemaking Assistance Needed: showers with supervision and relies on family for heavy meal prep and housekeeping            OT Problem List: Decreased activity tolerance;Impaired balance  (sitting and/or standing);Cardiopulmonary status limiting activity      OT Treatment/Interventions: Self-care/ADL training;Therapeutic activities;Patient/family education;Balance training;Energy conservation;DME and/or AE instruction    OT Goals(Current goals can be found in the care plan section) Acute Rehab OT Goals Patient Stated Goal: to go home OT Goal Formulation: With patient Time For Goal Achievement: 01/01/19 Potential to Achieve Goals: Good  OT Frequency: Min 2X/week   Barriers to D/C:            Co-evaluation              AM-PAC OT "6 Clicks" Daily Activity     Outcome Measure Help from another person eating meals?: None Help from another person taking care of personal grooming?: A Little Help from another person toileting, which includes using toliet, bedpan, or urinal?: None Help from another person bathing (including washing, rinsing, drying)?: None Help from another person to put on and taking off regular upper body clothing?: None Help from another person to put on and taking off regular lower body clothing?: None 6 Click Score: 23   End of Session Equipment Utilized During Treatment: Oxygen  Activity Tolerance: Patient tolerated treatment well Patient left: in bed;with call bell/phone within reach  OT Visit Diagnosis: Other abnormalities of gait and mobility (R26.89);Muscle weakness (generalized) (M62.81);Other (comment)(decreased activity tolerance)                Time: [id number] OT Time Calculation (min): 14 min Charges:  OT General Charges $OT Visit: 1 Visit OT Evaluation $OT Eval Moderate Complexity: 1 Mod  Nestor Lewandowsky, OTR/L Acute Rehabilitation Services Pager: [phone] Office: [phone]  Malka So 12/18/2018, 12:00 PM

## 2018-12-18 NOTE — Care Management Important Message (Signed)
Important Message  Patient Details  Name: Beverly Tucker MRN: [id number] Date of Birth: Jun 17, 1940   Medicare Important Message Given:  Yes     Shelda Altes 12/18/2018, 12:50 PM

## 2018-12-18 NOTE — Progress Notes (Addendum)
Progress Note  Patient Name: Beverly Tucker Date of Encounter: 12/18/2018  Primary Cardiologist: Ena Dawley, MD  Subjective   Overall feeling much better this morning. No palpitations, CP or dyspnea. Still on O2. HR running 50s on telemetry currently.  Inpatient Medications    Scheduled Meds: . abemaciclib  50 mg Oral q1800  . amiodarone  200 mg Oral BID  . apixaban  5 mg Oral BID  . furosemide  40 mg Oral Daily  . ondansetron (ZOFRAN) IV  4 mg Intravenous Q6H  . potassium chloride  20 mEq Oral Daily  . Vitamin D (Ergocalciferol)  50,000 Units Oral Weekly   Continuous Infusions: . sodium chloride 250 mL (12/17/18 1851)  . azithromycin 500 mg (12/17/18 2020)  . cefTRIAXone (ROCEPHIN)  IV 2 g (12/17/18 1852)   PRN Meds: sodium chloride, acetaminophen, ALPRAZolam, loperamide, loratadine, sodium chloride   Vital Signs    Vitals:   12/17/18 1948 12/18/18 0214 12/18/18 0530 12/18/18 0756  BP: (!) 126/52  (!) 109/35 (!) 155/56  Pulse: 76  (!) 55 67  Resp: 17  18 19   Temp: 98.9 F (37.2 C)  98.2 F (36.8 C) 98 F (36.7 C)  TempSrc: Oral  Oral Oral  SpO2: (!) 84%  97% 100%  Weight:  80.1 kg    Height:        Intake/Output Summary (Last 24 hours) at 12/18/2018 0910 Last data filed at 12/18/2018 0820 Gross per 24 hour  Intake 1449.56 ml  Output 1151 ml  Net 298.56 ml   Last 3 Weights 12/18/2018 12/17/2018 12/16/2018  Weight (lbs) 176 lb 8 oz 175 lb 12.8 oz 176 lb  Weight (kg) 80.06 kg 79.742 kg 79.833 kg     Telemetry    NSR/SB (upper 40s overnight), currently 50s-60s - Personally Reviewed  Physical Exam   GEN: No acute distress.  HEENT: Normocephalic, atraumatic, sclera non-icteric. Neck: No JVD or bruits. Cardiac: RRR no murmurs, rubs, or gallops.  Radials/DP/PT 1+ and equal bilaterally.  Respiratory: Slightly diminshed BS at bases (L>R), no wheezing/rales/rhonchi. Breathing is unlabored. GI: Soft, nontender, non-distended, BS +x 4. MS: no  deformity. Extremities: No clubbing or cyanosis. No edema. Distal pedal pulses are 2+ and equal bilaterally. Neuro:  AAOx3. Follows commands. Psych:  Responds to questions appropriately with a normal affect.  Labs    High Sensitivity Troponin:   Recent Labs  Lab 12/14/18 1325 12/14/18 1542 12/15/18 0421 12/15/18 0711 12/17/18 0812  TROPONINIHS 42* 49* 43* 35* 16      Cardiac EnzymesNo results for input(s): TROPONINI in the last 168 hours. No results for input(s): TROPIPOC in the last 168 hours.   Chemistry Recent Labs  Lab 12/15/18 0421 12/16/18 0545 12/17/18 0407 12/18/18 0513  NA 143 143 140 138  K 2.9* 3.3* 3.6 3.9  CL 100 103 98 98  CO2 30 33* 30 31  GLUCOSE 128* 116* 182* 118*  BUN 9 8 7* 9  CREATININE 1.11* 0.97 1.02* 0.89  CALCIUM 7.6* 7.2* 7.7* 7.9*  PROT 5.6*  --   --   --   ALBUMIN 2.5*  --   --   --   AST 15  --   --   --   ALT 16  --   --   --   ALKPHOS 79  --   --   --   BILITOT 0.4  --   --   --   GFRNONAA 47* 56* 52* >60  GFRAA 55* >  60 >60 >60  ANIONGAP 13 7 12 9      Hematology Recent Labs  Lab 12/14/18 1325 12/15/18 0421  WBC 9.5 9.1  RBC 3.72* 3.41*  HGB 10.8* 9.8*  HCT 35.9* 32.7*  MCV 96.5 95.9  MCH 29.0 28.7  MCHC 30.1 30.0  RDW 15.8* 15.8*  PLT 504* 442*    BNP Recent Labs  Lab 12/14/18 1325 12/16/18 0545  BNP 279.8* 115.4*     DDimer No results for input(s): DDIMER in the last 168 hours.   Radiology    No results found.  Cardiac Studies   Limited Echo 12/15/18 IMPRESSIONS 1. Left ventricular ejection fraction, by visual estimation, is 60 to 65%. The left ventricle has normal function. There is no left ventricular hypertrophy.  2. Left ventricular diastolic parameters are consistent with Grade I diastolic dysfunction (impaired relaxation).  3. Global right ventricle has normal systolic function.The right ventricular size is normal. No increase in right ventricular wall thickness.  4. Left atrial size was normal.   5. Right atrial size was normal.  6. Severe mitral annular calcification.  7. The mitral valve is normal in structure. Trace mitral valve regurgitation. No evidence of mitral stenosis.  8. The tricuspid valve is normal in structure. Tricuspid valve regurgitation is mild.  9. The aortic valve is normal in structure. Aortic valve regurgitation is trivial. Mild aortic valve sclerosis without stenosis. 10. The pulmonic valve was normal in structure. Pulmonic valve regurgitation is not visualized. 11. The inferior vena cava is normal in size with greater than 50% respiratory variability, suggesting right atrial pressure of 3 mmHg.  Patient Profile     78 y.o. female with recently diagnosed stage 4 breast cancer on chemo, recurrent malignant pleural effusionwith Pleurx catheter, paroxysmal atrial fibrillation, PSVT, HTN, GERD, anxiety, chronic appearing anemia, chronic diastolic CHF and CKD stage III who was admitted with palpitations/hypotension, found to have recurrent PSVT and suspected HCAP.  Assessment & Plan    1. Symptomatic PSVT - amiodarone added yesterday, would continue 200mg  BID for now. Baseline LFTs/TSH generally OK. Baseline HR now 50s. Per d/w Dr. Acie Fredrickson, hold further Lopressor and trend HR on amiodarone. I have asked pharmacy to review current regimen for any interactions with amiodarone.   2. Paroxysmal atrial fibrillation - quiescent this admission. Continue Eliquis as tolerated. Current dose appropriate.  3. Chronic diastolic CHF - patient appears euvolemic. Continue home Lasix. Add standing KCl 31meq daily given episodic hypokalemia.  4. HCAP in the context of metastatic breast CA and h/o malignant L pleural effusion, requiring supplemental oxygen - per primary team. Other comorbidities include hypoalbuminemia, anemia, thrombocytosis, and CKD stage III.  5. Mildly elevated troponin - suspect related to demand ischemia. Her prior EKGs in 10/2018 did show significant rate-related  ST depression. Fortunately she does not have any angina while in normal rhythm. Given comorbidities and lack of typical angina, plan to focus on maintenance of NSR. We do not anticipate further ischemic workup at this time. Would not treat with aspirin currently given concomitant Eliquis and chronic anemia.  For questions or updates, please contact [place] Please consult [email or web address] for contact info under Cardiology/STEMI.  Signed, Charlie Pitter, PA-C 12/18/2018, 9:10 AM    Attending Note:   The patient was seen and examined.  Agree with assessment and plan as noted above.  Changes made to the above note as needed.  Patient seen and independently examined with  Melina Copa, PA .   We discussed all aspects of  the encounter. I agree with the assessment and plan as stated above.  1.   SVT:   Continue loading with amio.  Hold metoprolol for now since HR is slow We can consider adding metoprolol back . Can also consider adding PRN propranolol 10 mg PRN for when she has an episode of SVT.   This may keep her from having to go the ER .   2.  PAF :  In sinus rhythm at present   3.   Chronic diastolic CHF:   Volume appears to be ideal at present    I have spent a total of 40 minutes with patient reviewing hospital  notes , telemetry, EKGs, labs and examining patient as well as establishing an assessment and plan that was discussed with the patient. > 50% of time was spent in direct patient care.    Thayer Headings, Brooke Bonito., MD, [facility] 12/18/2018, 9:51 AM [address],  [place] Pager [phone]

## 2018-12-18 NOTE — Progress Notes (Signed)
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 Has been given lasix po and iv for volume overload, but has improved. Also being tx for Pneumonia. Covid was negative.  Assessment & Plan:   Active Problems:   Shortness of breath   PSVT (paroxysmal supraventricular tachycardia) (HCC)   Acute on chronic diastolic CHF (congestive heart failure) ([place])   Community acquired pneumonia   Hyperkalemia   Gastroesophageal reflux disease without esophagitis   Hypokalemia  1.HAP -Continueceftrixone and azithromycin to complete total 7 day course -Respiratory panel negative -Blood culturethus far neg x 4 days -Continue with pulmonary toilet -Supportive care with O2 -Seems stable this AM  2.SVT/history of atrial fibrillation -Onapixaban and metoprolol -Cards consulted, now on amidarone with beta blocker -Patient noted to be tachycardic this AM, improved after receiving AM meds  3.Elevated troponin troponin Likely secondaryto demandischemiafrom tachycardia -Asymptomatic without chest pain, even though EKG with rate-related lateral ST depression.  -Cardiology following. -Troponins had trended down. -Echo with normal EF  4.History of metastatic breast cancer -Wants to take chemo at night, pharmacy checking on dosing . -Notified patient's oncologist about her status as an inpatient -Seems stable at this time  5.Acute onChronic diastolic heart  failure -mildly vol. Overloaded this am.  -Give lasix 40mg  iv x1,, now switched to po . -Continued on low place on low dose kcl standing dose.  6. Essential hypertension -stable on beta blocker  7.GERD -ContinuePPI  8.Hypokalemia -Resolved -Continued on kcl 87meq daily  9. Consult PT/OT  DVT prophylaxis: Eliquis Code Status: Full Family Communication: Pt in room, family not at bedside Disposition Plan: Uncertain at this time  Consultants:   Cardiology  Procedures:     Antimicrobials: Anti-infectives (From admission, onward)   Start     Dose/Rate Route Frequency Ordered Stop   12/16/18 0830  fluconazole (DIFLUCAN) tablet 100 mg     100 mg Oral  Once 12/16/18 0737 12/16/18 0830   12/15/18 1930  azithromycin (ZITHROMAX) 500 mg in sodium chloride 0.9 % 250 mL IVPB     500 mg 250 mL/hr over 60 Minutes Intravenous Every 24 hours 12/14/18 1942 12/20/18 1929   12/15/18 1830  cefTRIAXone (ROCEPHIN) 2 g in sodium chloride 0.9 % 100 mL IVPB     2 g 200 mL/hr over 30 Minutes Intravenous Every 24 hours 12/14/18 1942 12/20/18 1829   12/14/18 1800  cefTRIAXone (ROCEPHIN) 2 g in sodium chloride 0.9 % 100 mL IVPB     2 g 200 mL/hr over 30 Minutes Intravenous  Once 12/14/18 1746 12/14/18 1923   12/14/18 1800  azithromycin (ZITHROMAX) 500 mg in sodium chloride 0.9 % 250 mL IVPB     500 mg 250 mL/hr over 60 Minutes Intravenous  Once 12/14/18 1746 12/14/18 2032       Subjective: Reports feeling well. Tachycardic this AM  Objective: Vitals:   12/18/18 0214 12/18/18 0530 12/18/18 0756 12/18/18 1119  BP:  (!) 109/35 (!) 155/56 (!) 129/41  Pulse:  (!) 55 67 66  Resp:  18 19 16   Temp:  98.2 F (36.8 C) 98 F (36.7 C) 98.4 F (36.9 C)  TempSrc:  Oral Oral Oral  SpO2:  97% 100% 100%  Weight: 80.1 kg     Height:        Intake/Output Summary (Last 24 hours) at 12/18/2018 1303 Last data filed at 12/18/2018 1012 Gross per 24 hour  Intake 1569.56 ml  Output 1051 ml   Net 518.56 ml   Filed Weights   12/16/18 0500 12/17/18 0647 12/18/18 0214  Weight: 79.8 kg 79.7 kg 80.1 kg    Examination:  General exam: Appears calm and comfortable  Respiratory system: Clear to auscultation. Respiratory effort normal. Cardiovascular system: S1 & S2 heard, tachycardic Gastrointestinal system: Abdomen is nondistended, soft and nontender. No organomegaly or masses felt. Normal bowel sounds heard. Central nervous system: Alert and oriented. No focal neurological deficits. Extremities: Symmetric 5 x 5 power. Skin: No rashes, lesions  Psychiatry: Judgement and insight appear normal. Mood & affect appropriate.   Data Reviewed: I have personally reviewed following labs and imaging studies  CBC: Recent Labs  Lab 12/14/18 1325 12/15/18 0421  WBC 9.5 9.1  HGB 10.8* 9.8*  HCT 35.9* 32.7*  MCV 96.5 95.9  PLT 504* [id number]*   Basic Metabolic Panel: Recent Labs  Lab 12/14/18 1325 12/15/18 0421 12/16/18 0545 12/17/18 0407 12/17/18 0812 12/18/18 0513  NA 141 143 143 140  --  138  K 3.0* 2.9* 3.3* 3.6  --  3.9  CL 96* 100 103 98  --  98  CO2 32 30 33* 30  --  31  GLUCOSE 147* 128* 116* 182*  --  118*  BUN 10 9 8  7*  --  9  CREATININE 1.36* 1.11* 0.97 1.02*  --  0.89  CALCIUM 7.8* 7.6* 7.2* 7.7*  --  7.9*  MG 2.1  --   --   --  2.0  --    GFR: Estimated Creatinine Clearance: 54.7 mL/min (by C-G formula based on SCr of 0.89 mg/dL). Liver Function Tests: Recent Labs  Lab 12/15/18 0421  AST 15  ALT 16  ALKPHOS 79  BILITOT 0.4  PROT 5.6*  ALBUMIN 2.5*   No results for input(s): LIPASE, AMYLASE in the last 168 hours. No results for input(s): AMMONIA in the last 168 hours. Coagulation Profile: No results for input(s): INR, PROTIME in the last 168 hours. Cardiac Enzymes: No results for input(s): CKTOTAL, CKMB, CKMBINDEX, TROPONINI in the last 168 hours. BNP (last 3 results) No results for input(s): PROBNP in the last 8760 hours. HbA1C: No results for  input(s): HGBA1C in the last 72 hours. CBG: No results for input(s): GLUCAP in the last 168 hours. Lipid Profile: No results for input(s): CHOL, HDL, LDLCALC, TRIG, CHOLHDL, LDLDIRECT in the last 72 hours. Thyroid Function Tests: No results for input(s): TSH, T4TOTAL, FREET4, T3FREE, THYROIDAB in the last 72 hours. Anemia Panel: No results for input(s): VITAMINB12, FOLATE, FERRITIN, TIBC, IRON, RETICCTPCT in the last 72 hours. Sepsis Labs: No results for input(s): PROCALCITON, LATICACIDVEN in the last 168 hours.  Recent Results (from the past 240 hour(s))  SARS CORONAVIRUS 2 (TAT 6-24 HRS) Nasopharyngeal Nasopharyngeal Swab     Status: None   Collection Time: 12/14/18  5:47 PM   Specimen: Nasopharyngeal Swab  Result Value Ref Range Status   SARS Coronavirus 2 NEGATIVE NEGATIVE Final    Comment: (NOTE) SARS-CoV-2 target nucleic acids are NOT DETECTED. The SARS-CoV-2 RNA is generally detectable in upper and lower respiratory specimens during the acute  phase of infection. Negative results do not preclude SARS-CoV-2 infection, do not rule out co-infections with other pathogens, and should not be used as the sole basis for treatment or other patient management decisions. Negative results must be combined with clinical observations, patient history, and epidemiological information. The expected result is Negative. Fact Sheet for Patients: SugarRoll.be Fact Sheet for Healthcare Providers: https://www.woods-mathews.com/ This test is not yet approved or cleared by the [place] FDA and  has been authorized for detection and/or diagnosis of SARS-CoV-2 by FDA under an Emergency Use Authorization (EUA). This EUA will remain  in effect (meaning this test can be used) for the duration of the COVID-19 declaration under Section 56 4(b)(1) of the Act, 21 U.S.C. section 360bbb-3(b)(1), unless the authorization is terminated or revoked sooner.  Performed at [facility], [place] [address] [place], [place] [address]   Culture, blood (routine x 2) Call MD if unable to obtain prior to antibiotics being given     Status: None (Preliminary result)   Collection Time: 12/14/18 10:22 PM   Specimen: BLOOD  Result Value Ref Range Status   Specimen Description BLOOD LEFT ANTECUBITAL  Final   Special Requests   Final    BOTTLES DRAWN AEROBIC AND ANAEROBIC Blood Culture results may not be optimal due to an inadequate volume of blood received in culture bottles   Culture   Final    NO GROWTH 4 DAYS Performed at [facility], [place] [address] [place], [place] [address]    Report Status PENDING  Incomplete  Culture, blood (routine x 2) Call MD if unable to obtain prior to antibiotics being given     Status: None (Preliminary result)   Collection Time: 12/14/18 10:31 PM   Specimen: BLOOD  Result Value Ref Range Status   Specimen Description BLOOD LEFT ANTECUBITAL  Final   Special Requests   Final    BOTTLES DRAWN AEROBIC AND ANAEROBIC Blood Culture adequate volume   Culture   Final    NO GROWTH 4 DAYS Performed at [facility], [address] [place], [place] [address]    Report Status PENDING  Incomplete  Respiratory Panel by PCR     Status: None   Collection Time: 12/14/18 10:35 PM   Specimen: Nasopharyngeal Swab; Respiratory  Result Value Ref Range Status   Adenovirus NOT DETECTED NOT DETECTED Final   Coronavirus 229E NOT DETECTED NOT DETECTED Final    Comment: (NOTE) The Coronavirus on the Respiratory Panel, DOES NOT test for the novel  Coronavirus (2019 nCoV)    Coronavirus HKU1 NOT DETECTED NOT DETECTED Final   Coronavirus NL63 NOT DETECTED NOT DETECTED Final   Coronavirus OC43 NOT DETECTED NOT DETECTED Final   Metapneumovirus NOT DETECTED NOT DETECTED Final   Rhinovirus / Enterovirus NOT DETECTED NOT DETECTED Final   Influenza A NOT DETECTED NOT DETECTED Final   Influenza B NOT DETECTED NOT DETECTED Final    Parainfluenza Virus 1 NOT DETECTED NOT DETECTED Final   Parainfluenza Virus 2 NOT DETECTED NOT DETECTED Final   Parainfluenza Virus 3 NOT DETECTED NOT DETECTED Final   Parainfluenza Virus 4 NOT DETECTED NOT DETECTED Final   Respiratory Syncytial Virus NOT DETECTED NOT DETECTED Final   Bordetella pertussis NOT DETECTED NOT DETECTED Final   Chlamydophila pneumoniae NOT DETECTED NOT DETECTED Final   Mycoplasma pneumoniae NOT DETECTED NOT DETECTED Final    Comment: Performed at [facility], [place]. [address] [place],  [address]  Radiology Studies: No results found.  Scheduled Meds: . abemaciclib  50 mg Oral q1800  . amiodarone  200 mg Oral BID  . apixaban  5 mg Oral BID  . furosemide  40 mg Oral Daily  . ondansetron (ZOFRAN) IV  4 mg Intravenous Q6H  . potassium chloride  20 mEq Oral Daily  . Vitamin D (Ergocalciferol)  50,000 Units Oral Weekly   Continuous Infusions: . sodium chloride 250 mL (12/17/18 1851)  . azithromycin 500 mg (12/17/18 2020)  . cefTRIAXone (ROCEPHIN)  IV 2 g (12/17/18 1852)     LOS: 4 days   Marylu Lund, MD [facility] Pager On Amion  If 7PM-7AM, please contact night-coverage 12/18/2018, 1:03 PM

## 2018-12-18 NOTE — Progress Notes (Signed)
MEDICATION RELATED CONSULT NOTE    Reviewed medications for drug interactions at request of cardiology service.   Amiodarone newly added to regimen, this medication is labeled as a moderate cyp3A inhibitor. Package insert for verzenio indicates caution and monitor for adverse reactions and could consider dose reduction of verzenio. Patient recently had dose of verzenio decreased to 50mg  once daily which is theoretically the lowest dose.   Discussed findings with cardiology and recommended close monitoring of side effects from oral chemotherapy.   Erin Hearing PharmD., BCPS Clinical Pharmacist 12/18/2018 9:26 AM

## 2018-12-19 LAB — BASIC METABOLIC PANEL
Anion gap: 10 (ref 5–15)
BUN: 8 mg/dL (ref 8–23)
CO2: 33 mmol/L — ABNORMAL HIGH (ref 22–32)
Calcium: 8 mg/dL — ABNORMAL LOW (ref 8.9–10.3)
Chloride: 96 mmol/L — ABNORMAL LOW (ref 98–111)
Creatinine, Ser: 0.97 mg/dL (ref 0.44–1.00)
GFR calc Af Amer: >60 mL/min (ref >60)
GFR calc non Af Amer: 56 mL/min — ABNORMAL LOW (ref >60)
Glucose, Bld: 102 mg/dL — ABNORMAL HIGH (ref 70–99)
Potassium: 3.6 mmol/L (ref 3.5–5.1)
Sodium: 139 mmol/L (ref 135–145)

## 2018-12-19 LAB — CULTURE, BLOOD (ROUTINE X 2)
Culture: NO GROWTH
Culture: NO GROWTH
Special Requests: ADEQUATE

## 2018-12-19 MED ORDER — LOSARTAN POTASSIUM 50 MG PO TABS
50.0000 mg | ORAL_TABLET | Freq: Every day | ORAL | Status: DC
  Administered 2018-12-19 – 2018-12-21 (×3): 50 mg via ORAL
  Filled 2018-12-19 (×3): qty 1

## 2018-12-19 NOTE — Progress Notes (Signed)
PROGRESS NOTE    Beverly Tucker  [id number] DOB: January 10, 1941 DOA: 12/14/2018 PCP: [name], MD    Brief Narrative:  78 y.o.femalewith medical history significant of Hypertension GERD anxiety metastatic breast cancer on oral chemotherapy, diastolic heart failure, atrial fibrillation on Eliquis, SVT, recurrent malignant left pleural effusion status post Pleurx, pneumothorax,andCKD 3,pt presented withincreasing shortness of breath and palpitations which has beenintermittent over the last 48hours.Per EMS pt found with SVT 140's and bp 90/40., then has been in SR. Has been given lasix po and iv for volume overload, but has improved. Also being tx for Pneumonia. Covid was negative.  Assessment & Plan:   Active Problems:   Shortness of breath   PSVT (paroxysmal supraventricular tachycardia) (HCC)   Acute on chronic diastolic CHF (congestive heart failure) ([place])   Community acquired pneumonia   Hyperkalemia   Gastroesophageal reflux disease without esophagitis   Hypokalemia  1.HAP -Continueceftrixone and azithromycin to complete total 7 day course -Respiratory panel negative -Blood culturethus far neg x 4 days -Continue with pulmonary toilet -Continued with supportive care -Seems stable this AM  2.SVT/history of atrial fibrillation -Onapixaban -Metoprolol now on hold per Cardiology, continue amiodarone with plan to d/c on 200mg  daily -Patient noted to be tachycardic this AM, improved after receiving AM meds  3.Elevated troponin troponin Likely secondaryto demandischemiafrom tachycardia -Asymptomatic without chest pain, even though EKG with rate-related lateral ST depression.  -Cardiology following. -Troponins had trended down. -Echo with normal EF  4.History of metastatic breast cancer -Wants to take chemo at night, pharmacy checking on dosing . -Notified patient's oncologist about her status as an inpatient -Currently stable  5.Acute  onChronic diastolic heart failure -Cardiology following, since signed off -Improved  6. Essential hypertension -stable -Beta blocker d/c'd per Cardiology  7.GERD -ContinuePPI as tolerated  8.Hypokalemia -Resolved -Continued on kcl 57meq daily  9. Consult PT/OT  10. Breast cancer -Patient had been on Verzenio, restarted on 11/17. This AM, pt reports increased nausea, abd discomfort -chart reviewed. Pt has prior effects from chemo from previous admit, improved after holding chemo - Discussed case with on-call Oncologist who recommends holding Verzenio and to have pt contact her Oncologist, Dr. [name], when she is able to resume later  DVT prophylaxis: Eliquis Code Status: Full Family Communication: Pt in room, family not at bedside Disposition Plan: Uncertain at this time  Consultants:   Cardiology  Procedures:     Antimicrobials: Anti-infectives (From admission, onward)   Start     Dose/Rate Route Frequency Ordered Stop   12/16/18 0830  fluconazole (DIFLUCAN) tablet 100 mg     100 mg Oral  Once 12/16/18 0737 12/16/18 0830   12/15/18 1930  azithromycin (ZITHROMAX) 500 mg in sodium chloride 0.9 % 250 mL IVPB     500 mg 250 mL/hr over 60 Minutes Intravenous Every 24 hours 12/14/18 1942 12/20/18 1929   12/15/18 1830  cefTRIAXone (ROCEPHIN) 2 g in sodium chloride 0.9 % 100 mL IVPB     2 g 200 mL/hr over 30 Minutes Intravenous Every 24 hours 12/14/18 1942 12/20/18 1829   12/14/18 1800  cefTRIAXone (ROCEPHIN) 2 g in sodium chloride 0.9 % 100 mL IVPB     2 g 200 mL/hr over 30 Minutes Intravenous  Once 12/14/18 1746 12/14/18 1923   12/14/18 1800  azithromycin (ZITHROMAX) 500 mg in sodium chloride 0.9 % 250 mL IVPB     500 mg 250 mL/hr over 60 Minutes Intravenous  Once 12/14/18 1746 12/14/18 2032  Subjective: Complains of nausea, malaise  Objective: Vitals:   12/19/18 0556 12/19/18 0557 12/19/18 1045 12/19/18 1137  BP: (!) 151/78  (!) 143/63 132/83  Pulse:  80  78 88  Resp: 20  20 18   Temp: 97.8 F (36.6 C)  98.2 F (36.8 C) 98 F (36.7 C)  TempSrc: Oral  Oral Oral  SpO2: 100%  97% 99%  Weight:  79.8 kg    Height:  5\' 6"  (1.676 m)      Intake/Output Summary (Last 24 hours) at 12/19/2018 1648 Last data filed at 12/19/2018 1232 Gross per 24 hour  Intake 794.82 ml  Output 300 ml  Net 494.82 ml   Filed Weights   12/17/18 0647 12/18/18 0214 12/19/18 0557  Weight: 79.7 kg 80.1 kg 79.8 kg    Examination: General exam: Awake, laying in bed, in nad Respiratory system: Normal respiratory effort, no wheezing Cardiovascular system: regular rate, s1, s2 Gastrointestinal system: Soft, nondistended, positive BS Central nervous system: CN2-12 grossly intact, strength intact Extremities: Perfused, no clubbing Skin: Normal skin turgor, no notable skin lesions seen Psychiatry: Mood normal // no visual hallucinations   Data Reviewed: I have personally reviewed following labs and imaging studies  CBC: Recent Labs  Lab 12/14/18 1325 12/15/18 0421  WBC 9.5 9.1  HGB 10.8* 9.8*  HCT 35.9* 32.7*  MCV 96.5 95.9  PLT 504* [id number]*   Basic Metabolic Panel: Recent Labs  Lab 12/14/18 1325 12/15/18 0421 12/16/18 0545 12/17/18 0407 12/17/18 0812 12/18/18 0513 12/19/18 0455  NA 141 143 143 140  --  138 139  K 3.0* 2.9* 3.3* 3.6  --  3.9 3.6  CL 96* 100 103 98  --  98 96*  CO2 32 30 33* 30  --  31 33*  GLUCOSE 147* 128* 116* 182*  --  118* 102*  BUN 10 9 8  7*  --  9 8  CREATININE 1.36* 1.11* 0.97 1.02*  --  0.89 0.97  CALCIUM 7.8* 7.6* 7.2* 7.7*  --  7.9* 8.0*  MG 2.1  --   --   --  2.0  --   --    GFR: Estimated Creatinine Clearance: 50.1 mL/min (by C-G formula based on SCr of 0.97 mg/dL). Liver Function Tests: Recent Labs  Lab 12/15/18 0421  AST 15  ALT 16  ALKPHOS 79  BILITOT 0.4  PROT 5.6*  ALBUMIN 2.5*   No results for input(s): LIPASE, AMYLASE in the last 168 hours. No results for input(s): AMMONIA in the last 168 hours.  Coagulation Profile: No results for input(s): INR, PROTIME in the last 168 hours. Cardiac Enzymes: No results for input(s): CKTOTAL, CKMB, CKMBINDEX, TROPONINI in the last 168 hours. BNP (last 3 results) No results for input(s): PROBNP in the last 8760 hours. HbA1C: No results for input(s): HGBA1C in the last 72 hours. CBG: No results for input(s): GLUCAP in the last 168 hours. Lipid Profile: No results for input(s): CHOL, HDL, LDLCALC, TRIG, CHOLHDL, LDLDIRECT in the last 72 hours. Thyroid Function Tests: No results for input(s): TSH, T4TOTAL, FREET4, T3FREE, THYROIDAB in the last 72 hours. Anemia Panel: No results for input(s): VITAMINB12, FOLATE, FERRITIN, TIBC, IRON, RETICCTPCT in the last 72 hours. Sepsis Labs: No results for input(s): PROCALCITON, LATICACIDVEN in the last 168 hours.  Recent Results (from the past 240 hour(s))  SARS CORONAVIRUS 2 (TAT 6-24 HRS) Nasopharyngeal Nasopharyngeal Swab     Status: None   Collection Time: 12/14/18  5:47 PM   Specimen: Nasopharyngeal Swab  Result Value Ref Range Status   SARS Coronavirus 2 NEGATIVE NEGATIVE Final    Comment: (NOTE) SARS-CoV-2 target nucleic acids are NOT DETECTED. The SARS-CoV-2 RNA is generally detectable in upper and lower respiratory specimens during the acute phase of infection. Negative results do not preclude SARS-CoV-2 infection, do not rule out co-infections with other pathogens, and should not be used as the sole basis for treatment or other patient management decisions. Negative results must be combined with clinical observations, patient history, and epidemiological information. The expected result is Negative. Fact Sheet for Patients: SugarRoll.be Fact Sheet for Healthcare Providers: https://www.woods-mathews.com/ This test is not yet approved or cleared by the [place] FDA and  has been authorized for detection and/or diagnosis of SARS-CoV-2 by FDA under  an Emergency Use Authorization (EUA). This EUA will remain  in effect (meaning this test can be used) for the duration of the COVID-19 declaration under Section 56 4(b)(1) of the Act, 21 U.S.C. section 360bbb-3(b)(1), unless the authorization is terminated or revoked sooner. Performed at [facility], [place] [address], [place], [place] [address]   Culture, blood (routine x 2) Call MD if unable to obtain prior to antibiotics being given     Status: None   Collection Time: 12/14/18 10:22 PM   Specimen: BLOOD  Result Value Ref Range Status   Specimen Description BLOOD LEFT ANTECUBITAL  Final   Special Requests   Final    BOTTLES DRAWN AEROBIC AND ANAEROBIC Blood Culture results may not be optimal due to an inadequate volume of blood received in culture bottles   Culture   Final    NO GROWTH 5 DAYS Performed at [facility], [place] [address], [place], [place] [address]    Report Status 12/19/2018 FINAL  Final  Culture, blood (routine x 2) Call MD if unable to obtain prior to antibiotics being given     Status: None   Collection Time: 12/14/18 10:31 PM   Specimen: BLOOD  Result Value Ref Range Status   Specimen Description BLOOD LEFT ANTECUBITAL  Final   Special Requests   Final    BOTTLES DRAWN AEROBIC AND ANAEROBIC Blood Culture adequate volume   Culture   Final    NO GROWTH 5 DAYS Performed at [facility], [place] [address] [place], [place] [address]    Report Status 12/19/2018 FINAL  Final  Respiratory Panel by PCR     Status: None   Collection Time: 12/14/18 10:35 PM   Specimen: Nasopharyngeal Swab; Respiratory  Result Value Ref Range Status   Adenovirus NOT DETECTED NOT DETECTED Final   Coronavirus 229E NOT DETECTED NOT DETECTED Final    Comment: (NOTE) The Coronavirus on the Respiratory Panel, DOES NOT test for the novel  Coronavirus (2019 nCoV)    Coronavirus HKU1 NOT DETECTED NOT DETECTED Final   Coronavirus NL63 NOT DETECTED NOT DETECTED Final    Coronavirus OC43 NOT DETECTED NOT DETECTED Final   Metapneumovirus NOT DETECTED NOT DETECTED Final   Rhinovirus / Enterovirus NOT DETECTED NOT DETECTED Final   Influenza A NOT DETECTED NOT DETECTED Final   Influenza B NOT DETECTED NOT DETECTED Final   Parainfluenza Virus 1 NOT DETECTED NOT DETECTED Final   Parainfluenza Virus 2 NOT DETECTED NOT DETECTED Final   Parainfluenza Virus 3 NOT DETECTED NOT DETECTED Final   Parainfluenza Virus 4 NOT DETECTED NOT DETECTED Final   Respiratory Syncytial Virus NOT DETECTED NOT DETECTED Final   Bordetella pertussis NOT DETECTED NOT DETECTED  Final   Chlamydophila pneumoniae NOT DETECTED NOT DETECTED Final   Mycoplasma pneumoniae NOT DETECTED NOT DETECTED Final    Comment: Performed at [facility], [place] [address], [place], [place] [address]     Radiology Studies: No results found.  Scheduled Meds: . amiodarone  200 mg Oral BID  . apixaban  5 mg Oral BID  . furosemide  40 mg Oral Daily  . losartan  50 mg Oral Daily  . ondansetron (ZOFRAN) IV  4 mg Intravenous Q6H  . potassium chloride  20 mEq Oral Daily  . Vitamin D (Ergocalciferol)  50,000 Units Oral Weekly   Continuous Infusions: . sodium chloride 250 mL (12/18/18 2036)  . azithromycin 500 mg (12/18/18 2038)  . cefTRIAXone (ROCEPHIN)  IV 2 g (12/18/18 1835)     LOS: 5 days   Marylu Lund, MD [facility] Pager On Amion  If 7PM-7AM, please contact night-coverage 12/19/2018, 4:48 PM

## 2018-12-19 NOTE — Progress Notes (Signed)
Progress Note  Patient Name: Beverly Tucker Date of Encounter: 12/19/2018  Primary Cardiologist: Ena Dawley, MD  Subjective    Pt remains in [place] she is breathing better . I/O are positive - I'm not convinced that the I/O are accurate.   Inpatient Medications    Scheduled Meds: . abemaciclib  50 mg Oral q1800  . amiodarone  200 mg Oral BID  . apixaban  5 mg Oral BID  . furosemide  40 mg Oral Daily  . ondansetron (ZOFRAN) IV  4 mg Intravenous Q6H  . potassium chloride  20 mEq Oral Daily  . Vitamin D (Ergocalciferol)  50,000 Units Oral Weekly   Continuous Infusions: . sodium chloride 250 mL (12/18/18 2036)  . azithromycin 500 mg (12/18/18 2038)  . cefTRIAXone (ROCEPHIN)  IV 2 g (12/18/18 1835)   PRN Meds: sodium chloride, acetaminophen, ALPRAZolam, loperamide, loratadine, sodium chloride   Vital Signs    Vitals:   12/18/18 1119 12/18/18 1553 12/19/18 0556 12/19/18 0557  BP: (!) 129/41 129/80 (!) 151/78   Pulse: 66 80 80   Resp: 16 (!) 22 20   Temp: 98.4 F (36.9 C) 98.9 F (37.2 C) 97.8 F (36.6 C)   TempSrc: Oral Oral Oral   SpO2: 100% 100% 100%   Weight:    79.8 kg  Height:    5\' 6"  (1.676 m)    Intake/Output Summary (Last 24 hours) at 12/19/2018 0857 Last data filed at 12/19/2018 0205 Gross per 24 hour  Intake 1294.82 ml  Output 1000 ml  Net 294.82 ml   Last 3 Weights 12/19/2018 12/18/2018 12/17/2018  Weight (lbs) 175 lb 14.4 oz 176 lb 8 oz 175 lb 12.8 oz  Weight (kg) 79.788 kg 80.06 kg 79.742 kg     Telemetry    NSR/SB (upper 40s overnight), currently 50s-60s - Personally Reviewed  Physical Exam   Physical Exam: Blood pressure (!) 151/78, pulse 80, temperature 97.8 F (36.6 C), temperature source Oral, resp. rate 20, height 5\' 6"  (1.676 m), weight 79.8 kg, SpO2 100 %.  GEN:   Elderly female , NAD  HEENT: Normal NECK: No JVD; No carotid bruits LYMPHATICS: No lymphadenopathy CARDIAC: RRR  , bandages across breast   RESPIRATORY:  Clear to auscultation   ABDOMEN: Soft, non-tender, non-distended MUSCULOSKELETAL:  No edema; No deformity  SKIN: Warm and dry NEUROLOGIC:  Alert and oriented x 3   Labs    High Sensitivity Troponin:   Recent Labs  Lab 12/14/18 1325 12/14/18 1542 12/15/18 0421 12/15/18 0711 12/17/18 0812  TROPONINIHS 42* 49* 43* 35* 16      Cardiac EnzymesNo results for input(s): TROPONINI in the last 168 hours. No results for input(s): TROPIPOC in the last 168 hours.   Chemistry Recent Labs  Lab 12/15/18 0421  12/17/18 0407 12/18/18 0513 12/19/18 0455  NA 143   < > 140 138 139  K 2.9*   < > 3.6 3.9 3.6  CL 100   < > 98 98 96*  CO2 30   < > 30 31 33*  GLUCOSE 128*   < > 182* 118* 102*  BUN 9   < > 7* 9 8  CREATININE 1.11*   < > 1.02* 0.89 0.97  CALCIUM 7.6*   < > 7.7* 7.9* 8.0*  PROT 5.6*  --   --   --   --   ALBUMIN 2.5*  --   --   --   --   AST 15  --   --   --   --  ALT 16  --   --   --   --   ALKPHOS 79  --   --   --   --   BILITOT 0.4  --   --   --   --   GFRNONAA 47*   < > 52* >60 56*  GFRAA 55*   < > >60 >60 >60  ANIONGAP 13   < > 12 9 10    < > = values in this interval not displayed.     Hematology Recent Labs  Lab 12/14/18 1325 12/15/18 0421  WBC 9.5 9.1  RBC 3.72* 3.41*  HGB 10.8* 9.8*  HCT 35.9* 32.7*  MCV 96.5 95.9  MCH 29.0 28.7  MCHC 30.1 30.0  RDW 15.8* 15.8*  PLT 504* 442*    BNP Recent Labs  Lab 12/14/18 1325 12/16/18 0545  BNP 279.8* 115.4*     DDimer No results for input(s): DDIMER in the last 168 hours.   Radiology    No results found.  Cardiac Studies   Limited Echo 12/15/18 IMPRESSIONS 1. Left ventricular ejection fraction, by visual estimation, is 60 to 65%. The left ventricle has normal function. There is no left ventricular hypertrophy.  2. Left ventricular diastolic parameters are consistent with Grade I diastolic dysfunction (impaired relaxation).  3. Global right ventricle has normal systolic function.The  right ventricular size is normal. No increase in right ventricular wall thickness.  4. Left atrial size was normal.  5. Right atrial size was normal.  6. Severe mitral annular calcification.  7. The mitral valve is normal in structure. Trace mitral valve regurgitation. No evidence of mitral stenosis.  8. The tricuspid valve is normal in structure. Tricuspid valve regurgitation is mild.  9. The aortic valve is normal in structure. Aortic valve regurgitation is trivial. Mild aortic valve sclerosis without stenosis. 10. The pulmonic valve was normal in structure. Pulmonic valve regurgitation is not visualized. 11. The inferior vena cava is normal in size with greater than 50% respiratory variability, suggesting right atrial pressure of 3 mmHg.  Patient Profile     78 y.o. female with recently diagnosed stage 4 breast cancer on chemo, recurrent malignant pleural effusionwith Pleurx catheter, paroxysmal atrial fibrillation, PSVT, HTN, GERD, anxiety, chronic appearing anemia, chronic diastolic CHF and CKD stage III who was admitted with palpitations/hypotension, found to have recurrent PSVT and suspected HCAP.  Assessment & Plan    1. Symptomatic PSVT -  Amiodarone 200 BID was added.   Doing well.  Currently off the metoprolol.   Would reduce amio to 200 mg QD at discharge.   2. Paroxysmal atrial fibrillation -  Is maintaining NSR   3. Chronic diastolic CHF -  Seems about euvolumic ,  I/O are positive but I  dont think they are accurate.   4. HCAP in the context of metastatic breast CA and h/o malignant L pleural effusion, requiring supplemental oxygen -     5. Mildly elevated troponin - suspect related to demand ischemia. Her prior EKGs in 10/2018 did show significant rate-related ST depression. Fortunately she does not have any angina while in normal rhythm.  Given her metastatic breast cancer, I would not pursue any further ischemic work up at this time   6.  Essential HTN:  Have added  Losartan 50 mg QD.   [facility] will sign off.   Medication Recommendations:   Cont amio and losartan.  Metoprolol has been stopped.  Other recommendations (labs, testing, etc):   Follow up  as an outpatient:  With oncology and with Dr. Meda Coffee from cardiology    For questions or updates, please contact [place] Please consult [email or web address] for contact info under Cardiology/STEMI.    Mertie Moores, MD  12/19/2018 9:13 AM    [facility] [place],  [address], [place]  [address] Phone: [phone]; Fax: [phone]

## 2018-12-20 ENCOUNTER — Telehealth: Payer: Self-pay | Admitting: Cardiology

## 2018-12-20 LAB — BASIC METABOLIC PANEL
Anion gap: 10 (ref 5–15)
BUN: 10 mg/dL (ref 8–23)
CO2: 34 mmol/L — ABNORMAL HIGH (ref 22–32)
Calcium: 8.2 mg/dL — ABNORMAL LOW (ref 8.9–10.3)
Chloride: 98 mmol/L (ref 98–111)
Creatinine, Ser: 1.03 mg/dL — ABNORMAL HIGH (ref 0.44–1.00)
GFR calc Af Amer: 60 mL/min — ABNORMAL LOW (ref >60)
GFR calc non Af Amer: 52 mL/min — ABNORMAL LOW (ref >60)
Glucose, Bld: 104 mg/dL — ABNORMAL HIGH (ref 70–99)
Potassium: 3.7 mmol/L (ref 3.5–5.1)
Sodium: 142 mmol/L (ref 135–145)

## 2018-12-20 LAB — CBC
HCT: 29.7 % — ABNORMAL LOW (ref 36.0–46.0)
Hemoglobin: 8.6 g/dL — ABNORMAL LOW (ref 12.0–15.0)
MCH: 28.3 pg (ref 26.0–34.0)
MCHC: 29 g/dL — ABNORMAL LOW (ref 30.0–36.0)
MCV: 97.7 fL (ref 80.0–100.0)
Platelets: 392 10*3/uL (ref 150–400)
RBC: 3.04 MIL/uL — ABNORMAL LOW (ref 3.87–5.11)
RDW: 15.9 % — ABNORMAL HIGH (ref 11.5–15.5)
WBC: 5.2 10*3/uL (ref 4.0–10.5)
nRBC: 0 % (ref 0.0–0.2)

## 2018-12-20 MED ORDER — ONDANSETRON 4 MG PO TBDP: 4.0000 mg | ORAL_TABLET | Freq: Three times a day (TID) | ORAL | 0 refills | Status: AC | PRN

## 2018-12-20 MED ORDER — LOSARTAN POTASSIUM 50 MG PO TABS: 50.0000 mg | ORAL_TABLET | Freq: Every day | ORAL | 0 refills | Status: DC

## 2018-12-20 MED ORDER — LABETALOL HCL 5 MG/ML IV SOLN
5.0000 mg | INTRAVENOUS | Status: DC | PRN
  Administered 2018-12-20: 5 mg via INTRAVENOUS
  Filled 2018-12-20: qty 4

## 2018-12-20 MED ORDER — AMIODARONE HCL 200 MG PO TABS: 200.0000 mg | ORAL_TABLET | Freq: Every day | ORAL | 0 refills | Status: DC

## 2018-12-20 MED ORDER — LETROZOLE 2.5 MG PO TABS: 2.5000 mg | ORAL_TABLET | Freq: Every day | ORAL | 0 refills | Status: AC

## 2018-12-20 MED FILL — ONDANSETRON ODT 4 MG TABLET: 4 | 7 days supply | Qty: 20 | Fill #0

## 2018-12-20 MED FILL — AMIODARONE HCL 200 MG TABS: 200 | 30 days supply | Qty: 30 | Fill #0

## 2018-12-20 MED FILL — LOSARTAN POTASSIUM 50 MG TA: 50 | 30 days supply | Qty: 30 | Fill #0

## 2018-12-20 NOTE — Telephone Encounter (Signed)
Called and spoke to [place] at [facility]. She states that she told the operator that a virtual appt was fine. There are no other in office appts available within the 14 day timeframe so will keep appt with Dayna on 11/30 at this time.

## 2018-12-20 NOTE — Telephone Encounter (Signed)
Patient has Virtual TOC on Monday 12/30/18 with Melina Copa. I spoke with Alinda Sierras at [facility] who called to schedule the appt, she wanted an in office visit within 2 weeks. I told her we did not have anything available except virtual and she did not want to go off the care team. Just making sure this virtual TOC is the best option for the patient. She is being discharged today.

## 2018-12-20 NOTE — Progress Notes (Signed)
    Pt is stable form a SVT standpoint. Cont amio Cont. Eliquis   Has malignant breast cancer with malignant pleural effusion    Follow up with Dr. Meda Coffee  [facility] will sign off.   Medication Recommendations:  Cont. amio for her PAF and SVT Other recommendations (labs, testing, etc):   Follow up as an outpatient:  With Dr. Devoria Glassing, MD  12/20/2018 11:01 AM    [facility] [address],  [place], [place]  [address] Phone: [phone]; Fax: [phone]

## 2018-12-20 NOTE — TOC Transition Note (Addendum)
Transition of Care [facility]) - CM/SW Discharge Note   Patient Details  Name: Beverly Tucker MRN: [id number] Date of Birth: 03-Aug-1940  Transition of Care [facility] CM/SW Contact:  Zenon Mayo, RN Phone Number: 12/20/2018, 2:59 PM   Clinical Narrative:    Patient for dc today, notified Cory with [name]. NCM received consult about a bp cuff, patient states she does not want a bp cuff or need one.  Final next level of care: [place] Barriers to Discharge: No Barriers Identified   Patient Goals and CMS Choice Patient states their goals for this hospitalization and ongoing recovery are:: et better CMS Medicare.gov Compare Post Acute Care list provided to:: Patient Choice offered to / list presented to : Patient  Discharge Placement                       Discharge Plan and Services In-house Referral: NA Discharge Planning Services: CM Consult Post Acute Care Choice: Home Health          DME Arranged: (NA)         HH Arranged: RN, PT, OT HH Agency: [place] Date Bartholomew: 12/20/18 Time [place]: 57 Representative spoke with at [place]: cory  Social Determinants of Health ([place]) Interventions     Readmission Risk Interventions Readmission Risk Prevention Plan 12/20/2018 11/24/2018 10/23/2018  Transportation Screening Complete Complete Complete  PCP or Specialist Appt within 5-7 Days - - Complete  PCP or Specialist Appt within 3-5 Days - Complete -  Home Care Screening - - Complete  Medication Review (RN CM) - - Complete  HRI or [place] - Complete -  Social Work Consult for [place] Planning/Counseling - Complete -  Palliative Care Screening - Not Applicable -  Medication Review Press photographer) Complete Complete -  PCP or Specialist appointment within 3-5 days of discharge Complete - -  [place] or Home Care Consult Complete - -  SW Recovery Care/Counseling Consult Complete - -   Palliative Care Screening Not Applicable - -  [place] Not Applicable - -  Some recent data might be hidden

## 2018-12-20 NOTE — Progress Notes (Signed)
Physical Therapy Treatment Patient Details Name: Beverly Tucker MRN: [id number] DOB: 05-05-1940 Today's Date: 12/20/2018    History of Present Illness Patient is a 78 y/o female who presents with SOb and palpitations. Chest CT- patchy areas of nodularity in right chest and basilar consolidate changes concerning for PNA. Admitted with HCAP. Went into SVT but converted back to NSR 11/17. 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    PT Comments    Pt up in recliner upon arrival and agreeable to PT. Pt is progressing well towards PT goals. Pt performed seated LE therex with good performance of exercises with min cuing. Pt encouraged to perform exercises on her own throughout the day as HEP. Pt progressed ambulation tolerance today. Pt ambulated in/out of restroom safely with no AD. Unable to get accurate reading on portable pulse ox during ambulation but pt had no complaints of SOB. SpO2 post ambulation back in recliner was 98%. Pt on 3L O2 nasal cannula t/o entire session. Pt reports being on O2 at home and having no concerns for returning home. Pt has family across the street from her home that are available to provide assistance as necessary. Pt reports having home health PT prior to hospital admission. Discharge recommendation upgraded to home health PT following hospital discharge due to pts current level of function and caregiver support. Pt will benefit from continued skilled acute therapy to improve deficits in strength, balance and activity tolerance. Home health PT is appropriate to continue progressing deficits, increase independence and decrease caregiver support.  Follow Up Recommendations  Home health PT     Equipment Recommendations  None recommended by PT    Recommendations for Other Services       Precautions / Restrictions Precautions Precautions:  Fall Restrictions Weight Bearing Restrictions: No    Mobility  Bed Mobility               General bed mobility comments: deferred, pt up in recliner  Transfers Overall transfer level: Modified independent Equipment used: Rolling walker (2 wheeled) Transfers: Sit to/from Stand Sit to Stand: Min guard         General transfer comment: x2 from recliner and toilet, min guard for safety, pt steady t/o transfer  Ambulation/Gait Ambulation/Gait assistance: Min guard Gait Distance (Feet): 200 Feet Assistive device: Rolling walker (2 wheeled) Gait Pattern/deviations: Step-through pattern;Decreased stride length;Trunk flexed Gait velocity: decreased   General Gait Details: pt progressed ambulation tolerance ambulating with slow gait pattern, pt steady t/o ambulation with no instances of unsteadiness, buckling or LOB, pt ambulated in/out of restroom with no AD safely   Stairs             Wheelchair Mobility    Modified Rankin (Stroke Patients Only)       Balance Overall balance assessment: Needs assistance Sitting-balance support: Feet supported;No upper extremity supported Sitting balance-Leahy Scale: Good Sitting balance - Comments: pt steady sitting up in recliner performing LE therex   Standing balance support: No upper extremity supported Standing balance-Leahy Scale: Fair Standing balance comment: pt able to ambulate to/from restroom without AD, able to wash hands at sink without UE support                            Cognition Arousal/Alertness: Awake/alert Behavior During Therapy: WFL for tasks assessed/performed Overall Cognitive Status: Within Functional Limits for  tasks assessed                                        Exercises Total Joint Exercises Ankle Circles/Pumps: AROM;Both;10 reps Long Arc Quad: AROM;Both;10 reps Marching in Standing: AROM;Both;10 reps;Seated General Exercises - Lower Extremity Toe Raises:  AROM;Both;10 reps Heel Raises: AROM;Both;10 reps    General Comments General comments (skin integrity, edema, etc.): pt on 3 L O2 nasal cannula      Pertinent Vitals/Pain Pain Assessment: No/denies pain    Home Living                      Prior Function            PT Goals (current goals can now be found in the care plan section) Progress towards PT goals: Progressing toward goals    Frequency    Min 3X/week      PT Plan Discharge plan needs to be updated;Frequency needs to be updated    Co-evaluation              AM-PAC PT "6 Clicks" Mobility   Outcome Measure  Help needed turning from your back to your side while in a flat bed without using bedrails?: None Help needed moving from lying on your back to sitting on the side of a flat bed without using bedrails?: None Help needed moving to and from a bed to a chair (including a wheelchair)?: A Little Help needed standing up from a chair using your arms (e.g., wheelchair or bedside chair)?: A Little Help needed to walk in hospital room?: A Little Help needed climbing 3-5 steps with a railing? : A Lot 6 Click Score: 19    End of Session Equipment Utilized During Treatment: Gait belt;Oxygen Activity Tolerance: Patient limited by fatigue Patient left: in chair;with call bell/phone within reach;with chair alarm set Nurse Communication: Mobility status PT Visit Diagnosis: Unsteadiness on feet (R26.81);Muscle weakness (generalized) (M62.81)     Time: [id number] PT Time Calculation (min) (ACUTE ONLY): 28 min  Charges:  $Therapeutic Exercise: 23-37 mins                     Beverly Tucker PT, DPT 11:50 AM,12/20/18     Beverly Tucker 12/20/2018, 11:25 AM

## 2018-12-20 NOTE — Telephone Encounter (Signed)
Per Beverly Tucker at [facility], patient has a virtual TOC appt with Melina Copa on 12/30/18 at 9:00am

## 2018-12-20 NOTE — Discharge Summary (Addendum)
Physician Discharge Summary  Beverly Tucker [id number] DOB: [birth year] DOA: 12/14/2018  PCP: [name], MD  Admit date: 12/14/2018 Discharge date: 12/21/2018  Admitted From: Home Disposition:  Home  Recommendations for Outpatient Follow-up:  1. Follow up with PCP in 1-2 weeks 2. 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 Has been given lasix po and iv for volume overload, but has improved. Also being tx for Pneumonia. Covid was negative.  Discharge Diagnoses:  Active Problems:   Shortness of breath   PSVT (paroxysmal supraventricular tachycardia) (HCC)   Acute on chronic diastolic CHF (congestive heart failure) ([place])   Community acquired pneumonia   Hyperkalemia   Gastroesophageal reflux disease without esophagitis   Hypokalemia   1.HAP -Completed course ofceftrixone and azithromycin -Respiratory panel negative -Blood culturethus far neg  -Continued with supportive care -Seems stable this AM   2.SVT/history of atrial fibrillation -Onapixaban -Metoprolol now on hold per Cardiology, continue amiodarone and d/c on 200mg  daily -Stable  3.Elevated troponin troponin Likely secondaryto demandischemiafrom tachycardia -Asymptomatic without chest pain, even though EKG with rate-related lateral ST depression.  -Cardiology had been following. -Troponins had trended down. -Echo with normal  EF  4.History of metastatic breast cancer -Notified patient's oncologist about her status as an inpatient -Pt developed nausea and diarrhea while on verzenio. Chart reviewed. Pt has hx of similar symptoms related to chemo -Discussed case with pt's Oncologist who recommends holding Verzenio and to continue letrozole on discharge -Pt to follow up with Oncology on discharge  5.Acute onChronic diastolic heart failure -Cardiology following, since signed off -Improved  6. Essential hypertension -remained stable -Beta blocker d/c'd per Cardiology  7.GERD -ContinuePPI as tolerated  8.Hypokalemia -Resolved  9. Consult PT/OT -Recommendation for home health PT  Discharge Instructions   Allergies as of 12/21/2018   No Known Allergies     Medication List    STOP taking these medications   abemaciclib 50 MG tablet Commonly known as: Verzenio   Verzenio 50 MG tablet Generic drug: abemaciclib     TAKE these medications   ALPRAZolam 0.5 MG tablet Commonly known as: XANAX Take 0.25-0.5 mg by mouth 2 (two) times daily as needed for anxiety.   amiodarone 200 MG tablet Commonly known as: PACERONE Take 1 tablet (200 mg total) by mouth daily.   apixaban 5 MG Tabs tablet Commonly known as: ELIQUIS Take 1 tablet (5 mg total) by mouth 2 (two) times daily.   ergocalciferol 1.25 MG (50000 UT) capsule Commonly known as: VITAMIN D2 Take 1 capsule (50,000 Units total) by mouth once a week.   furosemide 40 MG tablet Commonly known as: LASIX Take 1 tablet (40 mg total) by mouth daily.   letrozole 2.5 MG tablet Commonly known as: FEMARA Take 1 tablet (2.5 mg total) by mouth daily.   loperamide 2 MG capsule Commonly known as: IMODIUM Take 1 capsule (2 mg total) by mouth as needed for diarrhea or loose stools.   losartan 50 MG tablet Commonly known as: COZAAR Take 1 tablet (50 mg total) by mouth daily.  metoprolol tartrate 50 MG tablet Commonly known as:  LOPRESSOR Take 1 tablet (50 mg total) by mouth 2 (two) times daily. What changed:   medication strength  how much to take   ondansetron 4 MG disintegrating tablet Commonly known as: Zofran ODT Take 1 tablet (4 mg total) by mouth every 8 (eight) hours as needed for nausea or vomiting.      Follow-up Information    [name], MD On 12/31/2018.   Specialty: Family Medicine Why: 12pm Contact information: [address] [place] [address]        Brunetta Genera, MD Follow up.   Specialties: Hematology, Oncology Why: as scheduled Contact information: [place] [address] [id number]        Dorothy Spark, MD. Go on 12/30/2018.   Specialty: Cardiology Why: @9 :00am Tele Visit Contact information: [place] [phone]          No Known Allergies  Consultations:  Cardiology  Discussed with Oncology  Procedures/Studies: Dg Chest 2 View  Result Date: 12/14/2018 CLINICAL DATA:  Shortness of breath EXAM: CHEST - 2 VIEW COMPARISON:  11/19/2018 FINDINGS: Cardiomegaly. Unchanged small bilateral pleural effusions and associated atelectasis or consolidation with a left-sided tunneled pleural drainage catheter. No new airspace opacity. The visualized skeletal structures are unremarkable. IMPRESSION: 1. Unchanged small bilateral pleural effusions and associated atelectasis or consolidation with a left-sided tunneled pleural drainage catheter. No new airspace opacity. 2.  Cardiomegaly. Electronically Signed   By: Eddie Candle M.D.   On: 12/14/2018 14:28   Dg Abd 1 View  Result Date: 11/23/2018 CLINICAL DATA:  Nausea, vomiting, and diarrhea. EXAM: ABDOMEN - 1 VIEW COMPARISON:  CT abdomen pelvis dated October 21, 2018. Abdominal x-ray dated October 16, 2018. FINDINGS: The bowel gas pattern is normal. No radio-opaque calculi or other significant radiographic abnormality are seen. Prior  cholecystectomy. No acute osseous abnormality. Left-sided pleural effusion with left chest tube in place. IMPRESSION: No acute findings. Electronically Signed   By: Titus Dubin M.D.   On: 11/23/2018 14:07   Cta Chest For Pe  Result Date: 12/14/2018 CLINICAL DATA:  Dyspnea, recent hospitalization. History of adenocarcinoma and malignant effusion. EXAM: CT ANGIOGRAPHY CHEST WITH CONTRAST TECHNIQUE: Multidetector CT imaging of the chest was performed using the standard protocol during bolus administration of intravenous contrast. Multiplanar CT image reconstructions and MIPs were obtained to evaluate the vascular anatomy. CONTRAST:  51mL OMNIPAQUE IOHEXOL 350 MG/ML SOLN COMPARISON:  Multiple priors including CT from 10/16/2018 and chest x-rays from October and today. FINDINGS: Cardiovascular: Heart size is stable with signs of right heart enlargement and engorgement of central pulmonary vasculature. Main pulmonary artery at 3.5 cm. No signs of pulmonary embolism. Basilar vascular assessment limited by respiratory motion. Aorta is of normal caliber. Mediastinum/Nodes: No signs of adenopathy in the chest. Lungs/Pleura: Interval placement of a PleurX catheter in the left posterior chest. Loculated, known malignant effusion in the left chest with increased fissural component compared to previous study but reduced posterior component. Overall volume of fluid is similar compared to previous exam. Increased pleural fluid however in the right chest compared to the prior study. Patchy areas of nodularity in the right mid chest. No signs of pneumothorax. Upper Abdomen: Incidental imaging of upper abdominal contents shows no acute abnormality. Musculoskeletal: Known fungating mass associated with the right breast is redemonstrated. Thickness and nodularity may be diminished compared to the prior study measuring approximately 1 cm  in greatest thickness where there are areas that previously measured 1.4-1.5 cm. The  significance is uncertain. There is an obvious soft tissue defect in this location. Multifocal areas of bony sclerosis and scattered areas of lytic change period. Sclerotic lesions scattered about the thoracic spine compatible with metastatic disease. Example of lytic in subtly expansile lesions in the ribs best seen on sagittal image 24 of series 9 in the right lateral fourth rib. There also subacute fractures in inferior ribs along the right chest. Review of the MIP images confirms the above findings. IMPRESSION: 1. Patchy areas of nodularity in the right chest and some basilar consolidative changes on the right raising the question of superimposed pneumonia. 2. No pulmonary embolism identified. 3. Interval placement of a PleurX catheter in the left posterior chest. Overall volume of fluid in the left chest similar compared to the prior study. 4. Pleural fluid in the right chest compared to the prior study. 5. Known fungating mass associated with the right breast is redemonstrated. Thickness and nodularity may be slightly diminished. 6. Bony metastatic disease as above. Aortic Atherosclerosis (ICD10-I70.0). Electronically Signed   By: Zetta Bills M.D.   On: 12/14/2018 17:26    Subjective: Reports feeling better today  Discharge Exam: Vitals:   12/21/18 1150 12/21/18 1355  BP:  (!) 95/52  Pulse: 73 (!) 52  Resp:    Temp:    SpO2:     Vitals:   12/21/18 1121 12/21/18 1147 12/21/18 1150 12/21/18 1355  BP: (!) 88/42 (!) 144/125  (!) 95/52  Pulse: 69 (!) 58 73 (!) 52  Resp: 18     Temp: 98.6 F (37 C)     TempSrc: Oral     SpO2: 97%     Weight:      Height:        General: Pt is alert, awake, not in acute distress Cardiovascular: RRR, S1/S2 +, no rubs, no gallops Respiratory: CTA bilaterally, no wheezing, no rhonchi Abdominal: Soft, NT, ND, bowel sounds + Extremities: no edema, no cyanosis   The results of significant diagnostics from this hospitalization (including imaging,  microbiology, ancillary and laboratory) are listed below for reference.     Microbiology: Recent Results (from the past 240 hour(s))  SARS CORONAVIRUS 2 (TAT 6-24 HRS) Nasopharyngeal Nasopharyngeal Swab     Status: None   Collection Time: 12/14/18  5:47 PM   Specimen: Nasopharyngeal Swab  Result Value Ref Range Status   SARS Coronavirus 2 NEGATIVE NEGATIVE Final    Comment: (NOTE) SARS-CoV-2 target nucleic acids are NOT DETECTED. The SARS-CoV-2 RNA is generally detectable in upper and lower respiratory specimens during the acute phase of infection. Negative results do not preclude SARS-CoV-2 infection, do not rule out co-infections with other pathogens, and should not be used as the sole basis for treatment or other patient management decisions. Negative results must be combined with clinical observations, patient history, and epidemiological information. The expected result is Negative. Fact Sheet for Patients: SugarRoll.be Fact Sheet for Healthcare Providers: https://www.woods-mathews.com/ This test is not yet approved or cleared by the [place] FDA and  has been authorized for detection and/or diagnosis of SARS-CoV-2 by FDA under an Emergency Use Authorization (EUA). This EUA will remain  in effect (meaning this test can be used) for the duration of the COVID-19 declaration under Section 56 4(b)(1) of the Act, 21 U.S.C. section 360bbb-3(b)(1), unless the authorization is terminated or revoked sooner. Performed at [facility], [place],  [place] [address]   Culture, blood (routine x 2) Call MD if unable to obtain prior to antibiotics being given     Status: None   Collection Time: 12/14/18 10:22 PM   Specimen: BLOOD  Result Value Ref Range Status   Specimen Description BLOOD LEFT ANTECUBITAL  Final   Special Requests   Final    BOTTLES DRAWN AEROBIC AND ANAEROBIC Blood Culture results may not be optimal due  to an inadequate volume of blood received in culture bottles   Culture   Final    NO GROWTH 5 DAYS Performed at [facility] [address], [place], [place] [address]    Report Status 12/19/2018 FINAL  Final  Culture, blood (routine x 2) Call MD if unable to obtain prior to antibiotics being given     Status: None   Collection Time: 12/14/18 10:31 PM   Specimen: BLOOD  Result Value Ref Range Status   Specimen Description BLOOD LEFT ANTECUBITAL  Final   Special Requests   Final    BOTTLES DRAWN AEROBIC AND ANAEROBIC Blood Culture adequate volume   Culture   Final    NO GROWTH 5 DAYS Performed at [facility], [place] [address] [place], [place] [address]    Report Status 12/19/2018 FINAL  Final  Respiratory Panel by PCR     Status: None   Collection Time: 12/14/18 10:35 PM   Specimen: Nasopharyngeal Swab; Respiratory  Result Value Ref Range Status   Adenovirus NOT DETECTED NOT DETECTED Final   Coronavirus 229E NOT DETECTED NOT DETECTED Final    Comment: (NOTE) The Coronavirus on the Respiratory Panel, DOES NOT test for the novel  Coronavirus (2019 nCoV)    Coronavirus HKU1 NOT DETECTED NOT DETECTED Final   Coronavirus NL63 NOT DETECTED NOT DETECTED Final   Coronavirus OC43 NOT DETECTED NOT DETECTED Final   Metapneumovirus NOT DETECTED NOT DETECTED Final   Rhinovirus / Enterovirus NOT DETECTED NOT DETECTED Final   Influenza A NOT DETECTED NOT DETECTED Final   Influenza B NOT DETECTED NOT DETECTED Final   Parainfluenza Virus 1 NOT DETECTED NOT DETECTED Final   Parainfluenza Virus 2 NOT DETECTED NOT DETECTED Final   Parainfluenza Virus 3 NOT DETECTED NOT DETECTED Final   Parainfluenza Virus 4 NOT DETECTED NOT DETECTED Final   Respiratory Syncytial Virus NOT DETECTED NOT DETECTED Final   Bordetella pertussis NOT DETECTED NOT DETECTED Final   Chlamydophila pneumoniae NOT DETECTED NOT DETECTED Final   Mycoplasma pneumoniae NOT DETECTED NOT DETECTED Final    Comment:  Performed at [facility], [place] [address] [place], [place] [address]     Labs: BNP (last 3 results) Recent Labs    11/16/18 1840 12/14/18 1325 12/16/18 0545  BNP 183.7* 279.8* 123456*   Basic Metabolic Panel: Recent Labs  Lab 12/17/18 0407 12/17/18 0812 12/18/18 0513 12/19/18 0455 12/20/18 0403 12/21/18 0418  NA 140  --  138 139 142 137  K 3.6  --  3.9 3.6 3.7 3.7  CL 98  --  98 96* 98 93*  CO2 30  --  31 33* 34* 34*  GLUCOSE 182*  --  118* 102* 104* 114*  BUN 7*  --  9 8 10 12   CREATININE 1.02*  --  0.89 0.97 1.03* 1.14*  CALCIUM 7.7*  --  7.9* 8.0* 8.2* 8.3*  MG  --  2.0  --   --   --   --    Liver Function Tests:  Recent Labs  Lab 12/15/18 0421 12/21/18 0418  AST 15 14*  ALT 16 12  ALKPHOS 79 75  BILITOT 0.4 0.5  PROT 5.6* 5.6*  ALBUMIN 2.5* 2.6*   No results for input(s): LIPASE, AMYLASE in the last 168 hours. No results for input(s): AMMONIA in the last 168 hours. CBC: Recent Labs  Lab 12/15/18 0421 12/20/18 0403 12/21/18 0418  WBC 9.1 5.2 6.7  HGB 9.8* 8.6* 9.6*  HCT 32.7* 29.7* 32.4*  MCV 95.9 97.7 96.1  PLT 442* 392 455*   Cardiac Enzymes: No results for input(s): CKTOTAL, CKMB, CKMBINDEX, TROPONINI in the last 168 hours. BNP: Invalid input(s): POCBNP CBG: No results for input(s): GLUCAP in the last 168 hours. D-Dimer No results for input(s): DDIMER in the last 72 hours. Hgb A1c No results for input(s): HGBA1C in the last 72 hours. Lipid Profile No results for input(s): CHOL, HDL, LDLCALC, TRIG, CHOLHDL, LDLDIRECT in the last 72 hours. Thyroid function studies No results for input(s): TSH, T4TOTAL, T3FREE, THYROIDAB in the last 72 hours.  Invalid input(s): FREET3 Anemia work up No results for input(s): VITAMINB12, FOLATE, FERRITIN, TIBC, IRON, RETICCTPCT in the last 72 hours. Urinalysis    Component Value Date/Time   COLORURINE YELLOW 12/14/2018 2206   APPEARANCEUR HAZY (A) 12/14/2018 2206   LABSPEC >1.046 (H) 12/14/2018  2206   PHURINE 6.0 12/14/2018 2206   GLUCOSEU NEGATIVE 12/14/2018 2206   HGBUR SMALL (A) 12/14/2018 2206   BILIRUBINUR NEGATIVE 12/14/2018 2206   KETONESUR 5 (A) 12/14/2018 2206   PROTEINUR 100 (A) 12/14/2018 2206   NITRITE NEGATIVE 12/14/2018 2206   LEUKOCYTESUR LARGE (A) 12/14/2018 2206   Sepsis Labs Invalid input(s): PROCALCITONIN,  WBC,  LACTICIDVEN Microbiology Recent Results (from the past 240 hour(s))  SARS CORONAVIRUS 2 (TAT 6-24 HRS) Nasopharyngeal Nasopharyngeal Swab     Status: None   Collection Time: 12/14/18  5:47 PM   Specimen: Nasopharyngeal Swab  Result Value Ref Range Status   SARS Coronavirus 2 NEGATIVE NEGATIVE Final    Comment: (NOTE) SARS-CoV-2 target nucleic acids are NOT DETECTED. The SARS-CoV-2 RNA is generally detectable in upper and lower respiratory specimens during the acute phase of infection. Negative results do not preclude SARS-CoV-2 infection, do not rule out co-infections with other pathogens, and should not be used as the sole basis for treatment or other patient management decisions. Negative results must be combined with clinical observations, patient history, and epidemiological information. The expected result is Negative. Fact Sheet for Patients: SugarRoll.be Fact Sheet for Healthcare Providers: https://www.woods-mathews.com/ This test is not yet approved or cleared by the [place] FDA and  has been authorized for detection and/or diagnosis of SARS-CoV-2 by FDA under an Emergency Use Authorization (EUA). This EUA will remain  in effect (meaning this test can be used) for the duration of the COVID-19 declaration under Section 56 4(b)(1) of the Act, 21 U.S.C. section 360bbb-3(b)(1), unless the authorization is terminated or revoked sooner. Performed at [facility], [place] [address] [place], [place] [address]   Culture, blood (routine x 2) Call MD if unable to obtain prior to  antibiotics being given     Status: None   Collection Time: 12/14/18 10:22 PM   Specimen: BLOOD  Result Value Ref Range Status   Specimen Description BLOOD LEFT ANTECUBITAL  Final   Special Requests   Final    BOTTLES DRAWN AEROBIC AND ANAEROBIC Blood Culture results may not be optimal due to an inadequate volume of blood received in culture  bottles   Culture   Final    NO GROWTH 5 DAYS Performed at [facility], [place] [address], [place], [place] [address]    Report Status 12/19/2018 FINAL  Final  Culture, blood (routine x 2) Call MD if unable to obtain prior to antibiotics being given     Status: None   Collection Time: 12/14/18 10:31 PM   Specimen: BLOOD  Result Value Ref Range Status   Specimen Description BLOOD LEFT ANTECUBITAL  Final   Special Requests   Final    BOTTLES DRAWN AEROBIC AND ANAEROBIC Blood Culture adequate volume   Culture   Final    NO GROWTH 5 DAYS Performed at [facility], [place] [address], [place], [place] [address]    Report Status 12/19/2018 FINAL  Final  Respiratory Panel by PCR     Status: None   Collection Time: 12/14/18 10:35 PM   Specimen: Nasopharyngeal Swab; Respiratory  Result Value Ref Range Status   Adenovirus NOT DETECTED NOT DETECTED Final   Coronavirus 229E NOT DETECTED NOT DETECTED Final    Comment: (NOTE) The Coronavirus on the Respiratory Panel, DOES NOT test for the novel  Coronavirus (2019 nCoV)    Coronavirus HKU1 NOT DETECTED NOT DETECTED Final   Coronavirus NL63 NOT DETECTED NOT DETECTED Final   Coronavirus OC43 NOT DETECTED NOT DETECTED Final   Metapneumovirus NOT DETECTED NOT DETECTED Final   Rhinovirus / Enterovirus NOT DETECTED NOT DETECTED Final   Influenza A NOT DETECTED NOT DETECTED Final   Influenza B NOT DETECTED NOT DETECTED Final   Parainfluenza Virus 1 NOT DETECTED NOT DETECTED Final   Parainfluenza Virus 2 NOT DETECTED NOT DETECTED Final   Parainfluenza Virus 3 NOT DETECTED NOT DETECTED Final    Parainfluenza Virus 4 NOT DETECTED NOT DETECTED Final   Respiratory Syncytial Virus NOT DETECTED NOT DETECTED Final   Bordetella pertussis NOT DETECTED NOT DETECTED Final   Chlamydophila pneumoniae NOT DETECTED NOT DETECTED Final   Mycoplasma pneumoniae NOT DETECTED NOT DETECTED Final    Comment: Performed at [facility], [place] [address] [place],  [address]   Time spent: 30 min  SIGNED:   Marylu Lund, MD  [facility] 12/21/2018, 3:23 PM  If 7PM-7AM, please contact night-coverage

## 2018-12-20 NOTE — Care Management Important Message (Signed)
Important Message  Patient Details  Name: Beverly Tucker MRN: [id number] Date of Birth: [birth year]   Medicare Important Message Given:  Yes     Shelda Altes 12/20/2018, 1:42 PM

## 2018-12-20 NOTE — Progress Notes (Signed)
   Dr. Wyline Copas requests that cardiology stay on board as pt is not being discharged today.   [name], AGNP-C [facility] 12/20/2018  3:42 PM

## 2018-12-20 NOTE — Progress Notes (Addendum)
After d/c order was placed, received call that pt's HR now sustained in 140-150's. Staff reports pt notes mild dizziness. EKG ordered, pending. Have ordered PRN IV labetalol with hold parameters. Have discussed with Cardiology. Cancel d/c for today. Have called and updated patient's son

## 2018-12-21 LAB — COMPREHENSIVE METABOLIC PANEL
ALT: 12 U/L (ref 0–44)
AST: 14 U/L — ABNORMAL LOW (ref 15–41)
Albumin: 2.6 g/dL — ABNORMAL LOW (ref 3.5–5.0)
Alkaline Phosphatase: 75 U/L (ref 38–126)
Anion gap: 10 (ref 5–15)
BUN: 12 mg/dL (ref 8–23)
CO2: 34 mmol/L — ABNORMAL HIGH (ref 22–32)
Calcium: 8.3 mg/dL — ABNORMAL LOW (ref 8.9–10.3)
Chloride: 93 mmol/L — ABNORMAL LOW (ref 98–111)
Creatinine, Ser: 1.14 mg/dL — ABNORMAL HIGH (ref 0.44–1.00)
GFR calc Af Amer: 53 mL/min — ABNORMAL LOW (ref >60)
GFR calc non Af Amer: 46 mL/min — ABNORMAL LOW (ref >60)
Glucose, Bld: 114 mg/dL — ABNORMAL HIGH (ref 70–99)
Potassium: 3.7 mmol/L (ref 3.5–5.1)
Sodium: 137 mmol/L (ref 135–145)
Total Bilirubin: 0.5 mg/dL (ref 0.3–1.2)
Total Protein: 5.6 g/dL — ABNORMAL LOW (ref 6.5–8.1)

## 2018-12-21 LAB — CBC
HCT: 32.4 % — ABNORMAL LOW (ref 36.0–46.0)
Hemoglobin: 9.6 g/dL — ABNORMAL LOW (ref 12.0–15.0)
MCH: 28.5 pg (ref 26.0–34.0)
MCHC: 29.6 g/dL — ABNORMAL LOW (ref 30.0–36.0)
MCV: 96.1 fL (ref 80.0–100.0)
Platelets: 455 10*3/uL — ABNORMAL HIGH (ref 150–400)
RBC: 3.37 MIL/uL — ABNORMAL LOW (ref 3.87–5.11)
RDW: 15.9 % — ABNORMAL HIGH (ref 11.5–15.5)
WBC: 6.7 10*3/uL (ref 4.0–10.5)
nRBC: 0 % (ref 0.0–0.2)

## 2018-12-21 MED ORDER — METOPROLOL TARTRATE 50 MG PO TABS
50.0000 mg | ORAL_TABLET | Freq: Two times a day (BID) | ORAL | Status: DC
  Administered 2018-12-21: 50 mg via ORAL
  Filled 2018-12-21: qty 1

## 2018-12-21 MED ORDER — METOPROLOL TARTRATE 50 MG PO TABS: 50.0000 mg | ORAL_TABLET | Freq: Two times a day (BID) | ORAL | 0 refills | Status: DC

## 2018-12-21 NOTE — Care Management (Signed)
Notified Beverly Tucker that patient will DC today. No other CM needs identified.

## 2018-12-21 NOTE — Progress Notes (Signed)
Progress Note  Patient Name: Beverly Tucker Date of Encounter: 12/21/2018  Primary Cardiologist: Ena Dawley, MD   Subjective   78 year old female with a history of metastatic breast cancer with a malignant pleural effusion. She has developed paroxysmal atrial fibrillation /SVT. He has been on metoprolol but this is not controlled her atrial fibrillation/SVT very well. We have added amiodarone which worked well for a while but last night she developed episodes of AF/SVT again. We have added metoprolol back into her medication list.  Inpatient Medications    Scheduled Meds: . amiodarone  200 mg Oral BID  . apixaban  5 mg Oral BID  . furosemide  40 mg Oral Daily  . losartan  50 mg Oral Daily  . metoprolol tartrate  50 mg Oral BID  . ondansetron (ZOFRAN) IV  4 mg Intravenous Q6H  . potassium chloride  20 mEq Oral Daily  . Vitamin D (Ergocalciferol)  50,000 Units Oral Weekly   Continuous Infusions: . sodium chloride 250 mL (12/19/18 2016)   PRN Meds: sodium chloride, acetaminophen, ALPRAZolam, labetalol, loperamide, loratadine, sodium chloride   Vital Signs    Vitals:   12/20/18 2036 12/21/18 0459 12/21/18 0840 12/21/18 0858  BP: (!) 120/56 (!) 122/57 137/61 (!) 146/63  Pulse: 71 63 76 86  Resp: 19 19  18   Temp: (!) 97.3 F (36.3 C) (!) 97.2 F (36.2 C)  97.8 F (36.6 C)  TempSrc: Oral Oral  Oral  SpO2: 96% 100% 100% 100%  Weight:  80.1 kg    Height:        Intake/Output Summary (Last 24 hours) at 12/21/2018 1110 Last data filed at 12/21/2018 0845 Gross per 24 hour  Intake 480 ml  Output 750 ml  Net -270 ml   Last 3 Weights 12/21/2018 12/20/2018 12/19/2018  Weight (lbs) 176 lb 8 oz 176 lb 1.6 oz 175 lb 14.4 oz  Weight (kg) 80.06 kg 79.878 kg 79.788 kg      Telemetry    Normal sinus rhythm with episodes of SVT/atrial fibrillation. Personally Reviewed  ECG    - Personally Reviewed  Physical Exam   GEN:  Elderly female, no acute distress..    Neck: No JVD Cardiac: RRR,   Respiratory: Clear to auscultation bilaterally. GI: Soft, nontender, non-distended  MS: No edema; No deformity. Neuro:  Nonfocal  Psych: Normal affect   Labs    High Sensitivity Troponin:   Recent Labs  Lab 12/14/18 1325 12/14/18 1542 12/15/18 0421 12/15/18 0711 12/17/18 0812  TROPONINIHS 42* 49* 43* 35* 16      Chemistry Recent Labs  Lab 12/15/18 0421  12/19/18 0455 12/20/18 0403 12/21/18 0418  NA 143   < > 139 142 137  K 2.9*   < > 3.6 3.7 3.7  CL 100   < > 96* 98 93*  CO2 30   < > 33* 34* 34*  GLUCOSE 128*   < > 102* 104* 114*  BUN 9   < > 8 10 12   CREATININE 1.11*   < > 0.97 1.03* 1.14*  CALCIUM 7.6*   < > 8.0* 8.2* 8.3*  PROT 5.6*  --   --   --  5.6*  ALBUMIN 2.5*  --   --   --  2.6*  AST 15  --   --   --  14*  ALT 16  --   --   --  12  ALKPHOS 79  --   --   --  75  BILITOT 0.4  --   --   --  0.5  GFRNONAA 47*   < > 56* 52* 46*  GFRAA 55*   < > >60 60* 53*  ANIONGAP 13   < > 10 10 10    < > = values in this interval not displayed.     Hematology Recent Labs  Lab 12/15/18 0421 12/20/18 0403 12/21/18 0418  WBC 9.1 5.2 6.7  RBC 3.41* 3.04* 3.37*  HGB 9.8* 8.6* 9.6*  HCT 32.7* 29.7* 32.4*  MCV 95.9 97.7 96.1  MCH 28.7 28.3 28.5  MCHC 30.0 29.0* 29.6*  RDW 15.8* 15.9* 15.9*  PLT 442* 392 455*    BNP Recent Labs  Lab 12/14/18 1325 12/16/18 0545  BNP 279.8* 115.4*     DDimer No results for input(s): DDIMER in the last 168 hours.   Radiology    No results found.  Cardiac Studies     Patient Profile     78 y.o. female with metastatic breast cancer and episodes of SVT/atrial fibrillation.  Assessment & Plan    1. Recurrent SVT/atrial fibrillation. Seems to be better controlled on the amiodarone. We will add metoprolol 50 mg twice a day. She was previously on Toprol-XL 75 mg twice a day.  If she develops recurrent episodes of SVT/atrial fibrillation at home she can take an extra metoprolol as needed.  Alternatively, I sometimes will give people propranolol 10 to 20 mg tablets to take as needed. Propranolol works much faster and might break the episodes of SVT quicker.  Given her history of metastatic breast cancer, she is not a candidate for ablation or other invasive cardiac procedures.      For questions or updates, please contact [place] Please consult [email or web address] for contact info under        Signed, Mertie Moores, MD  12/21/2018, 11:10 AM

## 2018-12-21 NOTE — Progress Notes (Signed)
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 Has been given lasix po and iv for volume overload, but has improved. Also being tx for Pneumonia. Covid was negative.  Assessment & Plan:   Active Problems:   Shortness of breath   PSVT (paroxysmal supraventricular tachycardia) (HCC)   Acute on chronic diastolic CHF (congestive heart failure) ([place])   Community acquired pneumonia   Hyperkalemia   Gastroesophageal reflux disease without esophagitis   Hypokalemia  1.HAP -Completed course ofceftrixone and azithromycin -Respiratory panel negative -Blood culturethus far neg  -Continued with supportive care -Seems stable this AM   2.SVT/history of atrial fibrillation -Onapixaban -Metoprolol initially hold per Cardiology, continue amiodarone and d/c on 200mg  daily -on 11/20, pt noted to have sustained HR in the 140-150's. -Patient was started on metoprolol 50mg  per Cardiology. Pt remained stable afterwards. -Stable  3.Elevated troponin troponin Likely secondaryto demandischemiafrom tachycardia -Asymptomatic without chest pain, even though EKG with rate-related lateral ST depression.  -Cardiology had been following. -Troponins had trended down. -Echo with normal EF  4.History of metastatic breast cancer -Notified patient's oncologist about her status as an inpatient -Pt developed nausea and diarrhea while on verzenio. Chart reviewed. Pt has  hx of similar symptoms related to chemo -Discussed case with pt's Oncologist who recommends holding Verzenio and to continue letrozole on discharge -Pt to follow up with Oncology on discharge  5.Acute onChronic diastolic heart failure -Cardiology following, since signed off -Improved  6. Essential hypertension -remained stable -Beta blocker d/c'd per Cardiology  7.GERD -ContinuePPIas tolerated  8.Hypokalemia -Resolved  9. Consult PT/OT -Recommendation for home health PT  DVT prophylaxis: Eliquis Code Status: Full Family Communication: Pt in room, discussed with patient's son over phone Disposition Plan: D/c home today  Consultants:   Cardiology  Procedures:     Antimicrobials: Anti-infectives (From admission, onward)   Start     Dose/Rate Route Frequency Ordered Stop   12/16/18 0830  fluconazole (DIFLUCAN) tablet 100 mg     100 mg Oral  Once 12/16/18 0737 12/16/18 0830   12/15/18 1930  azithromycin (ZITHROMAX) 500 mg in sodium chloride 0.9 % 250 mL IVPB     500 mg 250 mL/hr over 60 Minutes Intravenous Every 24 hours 12/14/18 1942 12/19/18 2117   12/15/18 1830  cefTRIAXone (ROCEPHIN) 2 g in sodium chloride 0.9 % 100 mL IVPB     2 g 200 mL/hr over 30 Minutes Intravenous Every 24 hours 12/14/18 1942 12/19/18 1747   12/14/18 1800  cefTRIAXone (ROCEPHIN) 2 g in sodium chloride 0.9 % 100 mL IVPB     2 g 200 mL/hr over 30 Minutes Intravenous  Once 12/14/18 1746 12/14/18 1923   12/14/18 1800  azithromycin (ZITHROMAX) 500 mg in sodium chloride 0.9 % 250 mL IVPB     500 mg 250 mL/hr over 60 Minutes Intravenous  Once 12/14/18 1746 12/14/18 2032      Subjective: Very eager to go home  Objective: Vitals:   12/21/18 1121 12/21/18 1147 12/21/18 1150 12/21/18 1355  BP: (!) 88/42 (!) 144/125  Marland Kitchen)  95/52  Pulse: 69 (!) 58 73 (!) 52  Resp: 18     Temp: 98.6 F (37 C)     TempSrc: Oral     SpO2: 97%     Weight:      Height:        Intake/Output Summary  (Last 24 hours) at 12/21/2018 1524 Last data filed at 12/21/2018 1158 Gross per 24 hour  Intake 480 ml  Output 800 ml  Net -320 ml   Filed Weights   12/19/18 0557 12/20/18 0545 12/21/18 0459  Weight: 79.8 kg 79.9 kg 80.1 kg    Examination: General exam: Conversant, in no acute distress Respiratory system: normal chest rise, clear, no audible wheezing Cardiovascular system: regular rhythm, s1-s2 Gastrointestinal system: Nondistended, nontender, pos BS Central nervous system: No seizures, no tremors Extremities: No cyanosis, no joint deformities Skin: No rashes, no pallor Psychiatry: Affect normal // no auditory hallucinations   Data Reviewed: I have personally reviewed following labs and imaging studies  CBC: Recent Labs  Lab 12/15/18 0421 12/20/18 0403 12/21/18 0418  WBC 9.1 5.2 6.7  HGB 9.8* 8.6* 9.6*  HCT 32.7* 29.7* 32.4*  MCV 95.9 97.7 96.1  PLT 442* 392 [id number]*   Basic Metabolic Panel: Recent Labs  Lab 12/17/18 0407 12/17/18 0812 12/18/18 0513 12/19/18 0455 12/20/18 0403 12/21/18 0418  NA 140  --  138 139 142 137  K 3.6  --  3.9 3.6 3.7 3.7  CL 98  --  98 96* 98 93*  CO2 30  --  31 33* 34* 34*  GLUCOSE 182*  --  118* 102* 104* 114*  BUN 7*  --  9 8 10 12   CREATININE 1.02*  --  0.89 0.97 1.03* 1.14*  CALCIUM 7.7*  --  7.9* 8.0* 8.2* 8.3*  MG  --  2.0  --   --   --   --    GFR: Estimated Creatinine Clearance: 42.7 mL/min (A) (by C-G formula based on SCr of 1.14 mg/dL (H)). Liver Function Tests: Recent Labs  Lab 12/15/18 0421 12/21/18 0418  AST 15 14*  ALT 16 12  ALKPHOS 79 75  BILITOT 0.4 0.5  PROT 5.6* 5.6*  ALBUMIN 2.5* 2.6*   No results for input(s): LIPASE, AMYLASE in the last 168 hours. No results for input(s): AMMONIA in the last 168 hours. Coagulation Profile: No results for input(s): INR, PROTIME in the last 168 hours. Cardiac Enzymes: No results for input(s): CKTOTAL, CKMB, CKMBINDEX, TROPONINI in the last 168 hours. BNP (last 3  results) No results for input(s): PROBNP in the last 8760 hours. HbA1C: No results for input(s): HGBA1C in the last 72 hours. CBG: No results for input(s): GLUCAP in the last 168 hours. Lipid Profile: No results for input(s): CHOL, HDL, LDLCALC, TRIG, CHOLHDL, LDLDIRECT in the last 72 hours. Thyroid Function Tests: No results for input(s): TSH, T4TOTAL, FREET4, T3FREE, THYROIDAB in the last 72 hours. Anemia Panel: No results for input(s): VITAMINB12, FOLATE, FERRITIN, TIBC, IRON, RETICCTPCT in the last 72 hours. Sepsis Labs: No results for input(s): PROCALCITON, LATICACIDVEN in the last 168 hours.  Recent Results (from the past 240 hour(s))  SARS CORONAVIRUS 2 (TAT 6-24 HRS) Nasopharyngeal Nasopharyngeal Swab     Status: None   Collection Time: 12/14/18  5:47 PM   Specimen: Nasopharyngeal Swab  Result Value Ref Range Status   SARS Coronavirus 2 NEGATIVE NEGATIVE Final    Comment: (NOTE) SARS-CoV-2 target nucleic acids are NOT DETECTED. The SARS-CoV-2 RNA is  generally detectable in upper and lower respiratory specimens during the acute phase of infection. Negative results do not preclude SARS-CoV-2 infection, do not rule out co-infections with other pathogens, and should not be used as the sole basis for treatment or other patient management decisions. Negative results must be combined with clinical observations, patient history, and epidemiological information. The expected result is Negative. Fact Sheet for Patients: SugarRoll.be Fact Sheet for Healthcare Providers: https://www.woods-mathews.com/ This test is not yet approved or cleared by the [place] FDA and  has been authorized for detection and/or diagnosis of SARS-CoV-2 by FDA under an Emergency Use Authorization (EUA). This EUA will remain  in effect (meaning this test can be used) for the duration of the COVID-19 declaration under Section 56 4(b)(1) of the Act, 21 U.S.C.  section 360bbb-3(b)(1), unless the authorization is terminated or revoked sooner. Performed at [facility], [place] [address], [place], [place] [address]   Culture, blood (routine x 2) Call MD if unable to obtain prior to antibiotics being given     Status: None   Collection Time: 12/14/18 10:22 PM   Specimen: BLOOD  Result Value Ref Range Status   Specimen Description BLOOD LEFT ANTECUBITAL  Final   Special Requests   Final    BOTTLES DRAWN AEROBIC AND ANAEROBIC Blood Culture results may not be optimal due to an inadequate volume of blood received in culture bottles   Culture   Final    NO GROWTH 5 DAYS Performed at [facility], [place] [address] [place], [place] [address]    Report Status 12/19/2018 FINAL  Final  Culture, blood (routine x 2) Call MD if unable to obtain prior to antibiotics being given     Status: None   Collection Time: 12/14/18 10:31 PM   Specimen: BLOOD  Result Value Ref Range Status   Specimen Description BLOOD LEFT ANTECUBITAL  Final   Special Requests   Final    BOTTLES DRAWN AEROBIC AND ANAEROBIC Blood Culture adequate volume   Culture   Final    NO GROWTH 5 DAYS Performed at [facility], [place] [address], [place],  [address]    Report Status 12/19/2018 FINAL  Final  Respiratory Panel by PCR     Status: None   Collection Time: 12/14/18 10:35 PM   Specimen: Nasopharyngeal Swab; Respiratory  Result Value Ref Range Status   Adenovirus NOT DETECTED NOT DETECTED Final   Coronavirus 229E NOT DETECTED NOT DETECTED Final    Comment: (NOTE) The Coronavirus on the Respiratory Panel, DOES NOT test for the novel  Coronavirus (2019 nCoV)    Coronavirus HKU1 NOT DETECTED NOT DETECTED Final   Coronavirus NL63 NOT DETECTED NOT DETECTED Final   Coronavirus OC43 NOT DETECTED NOT DETECTED Final   Metapneumovirus NOT DETECTED NOT DETECTED Final   Rhinovirus / Enterovirus NOT DETECTED NOT DETECTED Final   Influenza A NOT DETECTED NOT DETECTED  Final   Influenza B NOT DETECTED NOT DETECTED Final   Parainfluenza Virus 1 NOT DETECTED NOT DETECTED Final   Parainfluenza Virus 2 NOT DETECTED NOT DETECTED Final   Parainfluenza Virus 3 NOT DETECTED NOT DETECTED Final   Parainfluenza Virus 4 NOT DETECTED NOT DETECTED Final   Respiratory Syncytial Virus NOT DETECTED NOT DETECTED Final   Bordetella pertussis NOT DETECTED NOT DETECTED Final   Chlamydophila pneumoniae NOT DETECTED NOT DETECTED Final   Mycoplasma pneumoniae NOT DETECTED NOT DETECTED Final    Comment: Performed at [facility],  [address], [place], [place] [address]     Radiology Studies: No results found.  Scheduled Meds: . amiodarone  200 mg Oral BID  . apixaban  5 mg Oral BID  . furosemide  40 mg Oral Daily  . losartan  50 mg Oral Daily  . metoprolol tartrate  50 mg Oral BID  . ondansetron (ZOFRAN) IV  4 mg Intravenous Q6H  . potassium chloride  20 mEq Oral Daily  . Vitamin D (Ergocalciferol)  50,000 Units Oral Weekly   Continuous Infusions: . sodium chloride 250 mL (12/19/18 2016)     LOS: 7 days   Marylu Lund, MD [facility] Pager On Amion  If 7PM-7AM, please contact night-coverage 12/21/2018, 3:24 PM

## 2018-12-23 ENCOUNTER — Telehealth: Payer: Self-pay | Admitting: Hematology

## 2018-12-23 NOTE — Telephone Encounter (Signed)
Scheduled appt per 11/20 sch message - unable to reach pt . Left message with appt date and time   

## 2018-12-23 NOTE — Telephone Encounter (Signed)
Patient contacted regarding discharge from [facility] on 12/21/2018. The pt came to the phone and gave verbal permission for me to s/w her granddaughter Boris Sharper concerning her health care.  Brelyn understands that the pt is to follow up with provider Melina Copa, PA-c on 12/30/2018 at 9 am for a virtual visit. Brelyn understands the pts discharge instructions? Yes Brelyn understands the pts medications and regiment? Yes Brelyn understands to bring all medications to this visit? Yes

## 2018-12-23 NOTE — Telephone Encounter (Signed)
**Note De-identified Zakye Baby Obfuscation** [id number]

## 2018-12-24 ENCOUNTER — Encounter (HOSPITAL_COMMUNITY): Payer: Self-pay | Admitting: Emergency Medicine

## 2018-12-24 ENCOUNTER — Telehealth: Payer: Self-pay | Admitting: *Deleted

## 2018-12-24 ENCOUNTER — Inpatient Hospital Stay (HOSPITAL_COMMUNITY)
Admission: EM | Admit: 2018-12-24 | Discharge: 2018-12-31 | DRG: 189 | Disposition: A | Discharge status: Home Health | Payer: Medicare HMO | Attending: Family Medicine | Admitting: Family Medicine

## 2018-12-24 ENCOUNTER — Other Ambulatory Visit: Payer: Self-pay

## 2018-12-24 DIAGNOSIS — N183 Chronic kidney disease, stage 3 unspecified: Secondary | ICD-10-CM | POA: Diagnosis present

## 2018-12-24 DIAGNOSIS — K219 Gastro-esophageal reflux disease without esophagitis: Secondary | ICD-10-CM | POA: Diagnosis present

## 2018-12-24 DIAGNOSIS — D631 Anemia in chronic kidney disease: Secondary | ICD-10-CM | POA: Diagnosis present

## 2018-12-24 DIAGNOSIS — R001 Bradycardia, unspecified: Secondary | ICD-10-CM | POA: Diagnosis not present

## 2018-12-24 DIAGNOSIS — I951 Orthostatic hypotension: Secondary | ICD-10-CM | POA: Diagnosis not present

## 2018-12-24 DIAGNOSIS — Z515 Encounter for palliative care: Secondary | ICD-10-CM | POA: Diagnosis present

## 2018-12-24 DIAGNOSIS — R531 Weakness: Secondary | ICD-10-CM

## 2018-12-24 DIAGNOSIS — I480 Paroxysmal atrial fibrillation: Secondary | ICD-10-CM | POA: Diagnosis present

## 2018-12-24 DIAGNOSIS — E440 Moderate protein-calorie malnutrition: Secondary | ICD-10-CM | POA: Diagnosis present

## 2018-12-24 DIAGNOSIS — R609 Edema, unspecified: Secondary | ICD-10-CM | POA: Diagnosis not present

## 2018-12-24 DIAGNOSIS — Z79899 Other long term (current) drug therapy: Secondary | ICD-10-CM

## 2018-12-24 DIAGNOSIS — J9621 Acute and chronic respiratory failure with hypoxia: Principal | ICD-10-CM | POA: Diagnosis present

## 2018-12-24 DIAGNOSIS — I5032 Chronic diastolic (congestive) heart failure: Secondary | ICD-10-CM | POA: Diagnosis present

## 2018-12-24 DIAGNOSIS — Z7189 Other specified counseling: Secondary | ICD-10-CM

## 2018-12-24 DIAGNOSIS — N1832 Chronic kidney disease, stage 3b: Secondary | ICD-10-CM | POA: Diagnosis present

## 2018-12-24 DIAGNOSIS — Z9221 Personal history of antineoplastic chemotherapy: Secondary | ICD-10-CM

## 2018-12-24 DIAGNOSIS — Z20828 Contact with and (suspected) exposure to other viral communicable diseases: Secondary | ICD-10-CM | POA: Diagnosis present

## 2018-12-24 DIAGNOSIS — D649 Anemia, unspecified: Secondary | ICD-10-CM | POA: Diagnosis present

## 2018-12-24 DIAGNOSIS — Z6828 Body mass index (BMI) 28.0-28.9, adult: Secondary | ICD-10-CM

## 2018-12-24 DIAGNOSIS — D630 Anemia in neoplastic disease: Secondary | ICD-10-CM | POA: Diagnosis present

## 2018-12-24 DIAGNOSIS — N390 Urinary tract infection, site not specified: Secondary | ICD-10-CM | POA: Diagnosis present

## 2018-12-24 DIAGNOSIS — G4700 Insomnia, unspecified: Secondary | ICD-10-CM | POA: Diagnosis not present

## 2018-12-24 DIAGNOSIS — C7951 Secondary malignant neoplasm of bone: Secondary | ICD-10-CM | POA: Diagnosis present

## 2018-12-24 DIAGNOSIS — Z7901 Long term (current) use of anticoagulants: Secondary | ICD-10-CM

## 2018-12-24 DIAGNOSIS — F419 Anxiety disorder, unspecified: Secondary | ICD-10-CM | POA: Diagnosis present

## 2018-12-24 DIAGNOSIS — J910 Malignant pleural effusion: Secondary | ICD-10-CM | POA: Diagnosis present

## 2018-12-24 DIAGNOSIS — C50919 Malignant neoplasm of unspecified site of unspecified female breast: Secondary | ICD-10-CM | POA: Diagnosis present

## 2018-12-24 DIAGNOSIS — I440 Atrioventricular block, first degree: Secondary | ICD-10-CM | POA: Diagnosis present

## 2018-12-24 DIAGNOSIS — I130 Hypertensive heart and chronic kidney disease with heart failure and stage 1 through stage 4 chronic kidney disease, or unspecified chronic kidney disease: Secondary | ICD-10-CM | POA: Diagnosis present

## 2018-12-24 DIAGNOSIS — I471 Supraventricular tachycardia: Secondary | ICD-10-CM | POA: Diagnosis present

## 2018-12-24 DIAGNOSIS — J90 Pleural effusion, not elsewhere classified: Secondary | ICD-10-CM

## 2018-12-24 DIAGNOSIS — T451X5A Adverse effect of antineoplastic and immunosuppressive drugs, initial encounter: Secondary | ICD-10-CM | POA: Diagnosis present

## 2018-12-24 LAB — CBC
HCT: 33.8 % — ABNORMAL LOW (ref 36.0–46.0)
Hemoglobin: 10.1 g/dL — ABNORMAL LOW (ref 12.0–15.0)
MCH: 28.6 pg (ref 26.0–34.0)
MCHC: 29.9 g/dL — ABNORMAL LOW (ref 30.0–36.0)
MCV: 95.8 fL (ref 80.0–100.0)
Platelets: 431 10*3/uL — ABNORMAL HIGH (ref 150–400)
RBC: 3.53 MIL/uL — ABNORMAL LOW (ref 3.87–5.11)
RDW: 15.6 % — ABNORMAL HIGH (ref 11.5–15.5)
WBC: 7.8 10*3/uL (ref 4.0–10.5)
nRBC: 0 % (ref 0.0–0.2)

## 2018-12-24 LAB — BASIC METABOLIC PANEL
Anion gap: 11 (ref 5–15)
BUN: 22 mg/dL (ref 8–23)
CO2: 31 mmol/L (ref 22–32)
Calcium: 8.6 mg/dL — ABNORMAL LOW (ref 8.9–10.3)
Chloride: 97 mmol/L — ABNORMAL LOW (ref 98–111)
Creatinine, Ser: 1.27 mg/dL — ABNORMAL HIGH (ref 0.44–1.00)
GFR calc Af Amer: 46 mL/min — ABNORMAL LOW (ref >60)
GFR calc non Af Amer: 40 mL/min — ABNORMAL LOW (ref >60)
Glucose, Bld: 103 mg/dL — ABNORMAL HIGH (ref 70–99)
Potassium: 4 mmol/L (ref 3.5–5.1)
Sodium: 139 mmol/L (ref 135–145)

## 2018-12-24 LAB — CBG MONITORING, ED: Glucose-Capillary: 85 mg/dL (ref 70–99)

## 2018-12-24 MED ORDER — SODIUM CHLORIDE 0.9% FLUSH: 3.0000 mL | Freq: Once | INTRAVENOUS | Status: DC

## 2018-12-24 NOTE — ED Provider Notes (Signed)
[facility] EMERGENCY DEPARTMENT Provider Note   CSN: [id number] Arrival date & time: 12/24/18  1814     History   Chief Complaint Chief Complaint  Patient presents with   Leg Swelling    HPI Beverly Tucker is a 78 y.o. female.     The history is provided by the patient and medical records.     78 year old female with history of anemia, breast cancer on oral chemotherapy, CHF with EF of 60 to 65%, stage III chronic kidney disease, GERD, hypertension, paroxysmal A. fib on Eliquis, presenting to the ED with generalized weakness.  Patient hospitalized last week for episode of A. fib, fluid overload, and pneumonia.  He was discharged home on 12/20/2018, seem to be doing okay from a respiratory standpoint but still very weak.  States she is having a hard time even getting up and doing things around her house, has not been able to go to the grocery store or take care of any of her usual errands.  She states her appetite has been poor, does not really want to eat.  She denies any vomiting.  She has had some diarrhea.  Also reports for the past 3 days she has started having increased swelling in her feet and ankles.  She is on daily Lasix and was given large amounts of this while in the hospital.  States her legs are starting to feel heavy.  States her breathing still feels at baseline.  She is on 2 L supplemental oxygen at all times, states she was just started on that earlier this year and is not sure when she will be able to come off it.    Past Medical History:  Diagnosis Date   Anemia    Cancer ([facility])    Breast    Chronic diastolic CHF (congestive heart failure) (H[facility])    CKD (chronic kidney disease), stage III    GERD (gastroesophageal reflux disease)    Hypertension    Malignant pleural effusion    Metastatic breast cancer (HCC)    PAF (paroxysmal atrial fibrillation) (HCC)    PSVT (paroxysmal supraventricular tachycardia) ([place])     Patient Active  Problem List   Diagnosis Date Noted   Hypokalemia    Community acquired pneumonia    Hyperkalemia    Gastroesophageal reflux disease without esophagitis    Malnutrition of moderate degree 11/18/2018   PSVT (paroxysmal supraventricular tachycardia) ([employer]) 11/16/2018   Acute on chronic diastolic CHF (congestive heart failure) ([place]) 11/16/2018   Elevated troponin 11/16/2018   Leukocytosis 11/16/2018   Atrial fibrillation, chronic ([place]) 11/16/2018   Malignant pleural effusion    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 11/06/2018   AKI (acute kidney injury) ([place]) 11/06/2018   AF (paroxysmal atrial fibrillation) ([place]) 11/06/2018   Pleural effusion 11/06/2018   Bone metastases ([place]) 11/04/2018   Metastatic breast cancer (HCC)    Pneumothorax    Gram-positive bacteremia    Mass of breast, right 10/15/2018   Acute CHF (congestive heart failure) ([place]) 10/15/2018   Shortness of breath 10/14/2018   CKD (chronic kidney disease) stage 3, GFR 30-59 ml/min 02/06/2018   HTN (hypertension) 12/01/2015    Past Surgical History:  Procedure Laterality Date   CHOLECYSTECTOMY     IR PERC PLEURAL DRAIN W/INDWELL CATH W/IMG GUIDE  11/08/2018   IR THORACENTESIS ASP PLEURAL SPACE W/IMG GUIDE  10/22/2018     OB History   No obstetric history on file.      Home Medications  Prior to Admission medications   Medication Sig Start Date End Date Taking? Authorizing Provider  ALPRAZolam Duanne Moron) 0.5 MG tablet Take 0.25-0.5 mg by mouth 2 (two) times daily as needed for anxiety. 05/31/17   [provider]  amiodarone (PACERONE) 200 MG tablet Take 1 tablet (200 mg total) by mouth daily. 12/20/18 01/19/19  Donne Hazel, MD  apixaban (ELIQUIS) 5 MG TABS tablet Take 1 tablet (5 mg total) by mouth 2 (two) times daily. 10/23/18   Jeanmarie Hubert, MD  ergocalciferol (VITAMIN D2) 1.25 MG (50000 UT) capsule Take 1 capsule (50,000 Units total) by mouth once a week. 11/04/18   Brunetta Genera, MD  furosemide (LASIX) 40 MG tablet Take 1 tablet (40 mg total) by mouth daily. 11/23/18   Dessa Phi, DO  letrozole [facility]) 2.5 MG tablet Take 1 tablet (2.5 mg total) by mouth daily. 12/20/18 01/19/19  Donne Hazel, MD  loperamide (IMODIUM) 2 MG capsule Take 1 capsule (2 mg total) by mouth as needed for diarrhea or loose stools. 11/24/18   Dessa Phi, DO  losartan (COZAAR) 50 MG tablet Take 1 tablet (50 mg total) by mouth daily. 12/21/18 01/20/19  Donne Hazel, MD  metoprolol tartrate (LOPRESSOR) 50 MG tablet Take 1 tablet (50 mg total) by mouth 2 (two) times daily. 12/21/18 01/20/19  Donne Hazel, MD  ondansetron (ZOFRAN ODT) 4 MG disintegrating tablet Take 1 tablet (4 mg total) by mouth every 8 (eight) hours as needed for nausea or vomiting. 12/20/18   Donne Hazel, MD    Family History Family History  Problem Relation Age of Onset   Diabetes Mellitus II Other        son    Social History Social History   Tobacco Use   Smoking status: Never Smoker   Smokeless tobacco: Never Used  Substance Use Topics   Alcohol use: Never    Frequency: Never   Drug use: Never     Allergies   Patient has no known allergies.   Review of Systems Review of Systems  Cardiovascular: Positive for leg swelling.  Neurological: Positive for weakness.  All other systems reviewed and are negative.    Physical Exam Updated Vital Signs BP (!) 154/65 (BP Location: Left Arm)    Pulse 61    Temp 98.1 F (36.7 C) (Oral)    Resp 17    SpO2 100%   Physical Exam Vitals signs and nursing note reviewed.  Constitutional:      Appearance: She is well-developed.     Comments: Elderly, NAD  HENT:     Head: Normocephalic and atraumatic.  Eyes:     Conjunctiva/sclera: Conjunctivae normal.     Pupils: Pupils are equal, round, and reactive to light.  Neck:     Musculoskeletal: Normal range of motion.  Cardiovascular:     Rate and Rhythm: Normal rate and regular  rhythm.     Heart sounds: Normal heart sounds.  Pulmonary:     Effort: Pulmonary effort is normal.     Breath sounds: Normal breath sounds. No wheezing or rhonchi.     Comments: Breathing unlabored, able to speak in full sentences, lungs overall clear, 2L O2 in use with sats of 100% during exam Chest:     Comments: pleurx catheter left chest Abdominal:     General: Bowel sounds are normal.     Palpations: Abdomen is soft.  Musculoskeletal: Normal range of motion.     Comments: 2+ pitting edema  of bilateral ankles and feet, no salf asymmetry, tenderness, or palpable cords noted, no overlying skin changes  Skin:    General: Skin is warm and dry.  Neurological:     Mental Status: She is alert and oriented to person, place, and time.      ED Treatments / Results  Labs (all labs ordered are listed, but only abnormal results are displayed) Labs Reviewed  BASIC METABOLIC PANEL - Abnormal; Notable for the following components:      Result Value   Chloride 97 (*)    Glucose, Bld 103 (*)    Creatinine, Ser 1.27 (*)    Calcium 8.6 (*)    GFR calc non Af Amer 40 (*)    GFR calc Af Amer 46 (*)    All other components within normal limits  CBC - Abnormal; Notable for the following components:   RBC 3.53 (*)    Hemoglobin 10.1 (*)    HCT 33.8 (*)    MCHC 29.9 (*)    RDW 15.6 (*)    Platelets 431 (*)    All other components within normal limits  URINALYSIS, ROUTINE W REFLEX MICROSCOPIC - Abnormal; Notable for the following components:   APPearance HAZY (*)    Ketones, ur 5 (*)    Leukocytes,Ua LARGE (*)    WBC, UA >50 (*)    Bacteria, UA RARE (*)    Non Squamous Epithelial 0-5 (*)    All other components within normal limits  TROPONIN I (HIGH SENSITIVITY) - Abnormal; Notable for the following components:   Troponin I (High Sensitivity) 18 (*)    All other components within normal limits  SARS CORONAVIRUS 2 (TAT 6-24 HRS)  BRAIN NATRIURETIC PEPTIDE  CBG MONITORING, ED     EKG EKG Interpretation  Date/Time:  Wednesday December 25 2018 01:05:17 EST Ventricular Rate:  59 PR Interval:    QRS Duration: 117 QT Interval:  472 QTC Calculation: 468 R Axis:   15 Text Interpretation: Sinus rhythm Atrial premature complex Borderline prolonged PR interval Nonspecific intraventricular conduction delay Confirmed by Ripley Fraise [phone]) on 12/25/2018 1:18:27 AM   Radiology Dg Chest 2 View  Result Date: 12/25/2018 CLINICAL DATA:  History of community-acquired pneumonia. EXAM: CHEST - 2 VIEW COMPARISON:  12/14/2018 FINDINGS: Cardiac shadow is enlarged but stable. Aortic calcifications are seen. Left-sided PleurX catheter is again noted. Stable left pleural effusion is seen. Slight increase in right-sided effusion when compare with the prior study is noted. There is likely underlying atelectasis/infiltrate present. No bony abnormality is seen. IMPRESSION: Increasing right-sided pleural effusion when compare with the prior study. Electronically Signed   By: Inez Catalina M.D.   On: 12/25/2018 00:22    Procedures Procedures (including critical care time)  Medications Ordered in ED Medications  sodium chloride flush (NS) 0.9 % injection 3 mL (3 mLs Intravenous Not Given 12/24/18 2350)     Initial Impression / Assessment and Plan / ED Course  I have reviewed the triage vital signs and the nursing notes.  Pertinent labs & imaging results that were available during my care of the patient were reviewed by me and considered in my medical decision making (see chart for details).  78 year old female here with generalized weakness.  Recent admission for PAF, pneumonia, and CHF exacerbation.  Release from the hospital 4 days ago and states she has felt increasingly weak since then.  She has not had any falls or syncopal events.  States her breathing feels about her baseline,  she is continuing her 2 L supplemental oxygen.  She is afebrile and nontoxic in appearance.  Her  lung sounds are fairly clear and she is in no acute distress.  Pleurx catheter left chest.  Screening labs from triage without any significant change from prior.  Will obtain troponin, BNP, urinalysis as well as chest x-ray.  Troponin minimally elevated at 18, downtrending from prior.  BNP is normal.  UA with >50 WBC's, rare bacteria, no nitrites.  This also appears unchanged from prior.  Will attempt to ambulate here.  2:05 AM Patient able to ambulate here but extremely weak and fatigued while walking.  States she has been having significant trouble like this at home.  States when up and walking she feels so weak like she may fall down.  Patient does admit that she lives alone, nurse does come twice a week to drain her Pleurx catheter, otherwise she does not have any assistance.  When talking with her, it sounds like she has had a drastic decline in her health over the past few months with very frequent hospitalizations and multiple medication adjustments.  Prior to this she was caring for her home independently, mowing the lawn, etc.  Will discuss with hospitalist for repeat admission, likely will need repeat PT evaluation along with home health services +/- right thoracentesis/drain.  Discussed with hospitalist, Dr. Marlowe Sax-- she will admit for ongoing care.  COVID screen has been sent.    Final Clinical Impressions(s) / ED Diagnoses   Final diagnoses:  Generalized weakness  Pleural effusion  Peripheral edema    ED Discharge Orders    None       Larene Pickett, PA-C 12/25/18 0259    Ripley Fraise, MD 12/25/18 [phone]

## 2018-12-24 NOTE — ED Triage Notes (Signed)
Pt was recently hospitalized for pneumonia and went home on Saturday- pt states since then she had felt weak and started having swelling in her bilateral lower legs.  Pt states "I just feel very bad".

## 2018-12-24 NOTE — Telephone Encounter (Signed)
Lisa Roca, RN from [facility] called. [CB#[phone]] She wanted to know if the pleurex was to continue being drained twice a week (as was done before hospitalization) following recent discharge from hospital?  Attempted to contact her, LVM to continue draining pleurex as before - order is not changed. Please contact office for any changes in amount/color or patient symptoms.

## 2018-12-24 NOTE — ED Notes (Signed)
CBG Results of 85 reported to [place], Therapist, sports.

## 2018-12-25 ENCOUNTER — Observation Stay (HOSPITAL_COMMUNITY): Payer: Medicare HMO

## 2018-12-25 ENCOUNTER — Encounter (HOSPITAL_COMMUNITY): Payer: Self-pay

## 2018-12-25 ENCOUNTER — Emergency Department (HOSPITAL_COMMUNITY): Payer: Medicare HMO

## 2018-12-25 ENCOUNTER — Other Ambulatory Visit: Payer: Self-pay

## 2018-12-25 DIAGNOSIS — J90 Pleural effusion, not elsewhere classified: Secondary | ICD-10-CM | POA: Diagnosis not present

## 2018-12-25 DIAGNOSIS — N390 Urinary tract infection, site not specified: Secondary | ICD-10-CM | POA: Diagnosis present

## 2018-12-25 DIAGNOSIS — R531 Weakness: Secondary | ICD-10-CM

## 2018-12-25 HISTORY — PX: IR THORACENTESIS ASP PLEURAL SPACE W/IMG GUIDE: IMG5380

## 2018-12-25 LAB — SARS CORONAVIRUS 2 (TAT 6-24 HRS): SARS Coronavirus 2: NEGATIVE

## 2018-12-25 LAB — URINALYSIS, ROUTINE W REFLEX MICROSCOPIC
Bilirubin Urine: NEGATIVE
Glucose, UA: NEGATIVE mg/dL
Hgb urine dipstick: NEGATIVE
Ketones, ur: 5 mg/dL — AB
Nitrite: NEGATIVE
Protein, ur: NEGATIVE mg/dL
Specific Gravity, Urine: 1.015 (ref 1.005–1.030)
WBC, UA: >50 WBC/hpf — ABNORMAL HIGH (ref 0–5)
pH: 5 (ref 5.0–8.0)

## 2018-12-25 LAB — TROPONIN I (HIGH SENSITIVITY)
Troponin I (High Sensitivity): 18 ng/L — ABNORMAL HIGH (ref <18)
Troponin I (High Sensitivity): 20 ng/L — ABNORMAL HIGH (ref <18)

## 2018-12-25 LAB — BRAIN NATRIURETIC PEPTIDE: B Natriuretic Peptide: 68.2 pg/mL (ref 0.0–100.0)

## 2018-12-25 LAB — MAGNESIUM: Magnesium: 2.2 mg/dL (ref 1.7–2.4)

## 2018-12-25 MED ORDER — ONDANSETRON HCL 4 MG/2ML IJ SOLN
4.0000 mg | Freq: Four times a day (QID) | INTRAMUSCULAR | Status: DC | PRN
  Administered 2018-12-25 – 2018-12-28 (×4): 4 mg via INTRAVENOUS
  Filled 2018-12-25 (×4): qty 2

## 2018-12-25 MED ORDER — METOPROLOL TARTRATE 50 MG PO TABS
50.0000 mg | ORAL_TABLET | Freq: Two times a day (BID) | ORAL | Status: DC
  Administered 2018-12-25 – 2018-12-27 (×4): 50 mg via ORAL
  Filled 2018-12-25 (×6): qty 1

## 2018-12-25 MED ORDER — FUROSEMIDE 10 MG/ML IJ SOLN: 40.0000 mg | Freq: Once | INTRAMUSCULAR | Status: DC

## 2018-12-25 MED ORDER — APIXABAN 5 MG PO TABS
5.0000 mg | ORAL_TABLET | Freq: Two times a day (BID) | ORAL | Status: DC
  Administered 2018-12-25 – 2018-12-31 (×13): 5 mg via ORAL
  Filled 2018-12-25 (×13): qty 1

## 2018-12-25 MED ORDER — FUROSEMIDE 10 MG/ML IJ SOLN: 40.0000 mg | Freq: Every day | INTRAMUSCULAR | Status: DC

## 2018-12-25 MED ORDER — FUROSEMIDE 40 MG PO TABS
40.0000 mg | ORAL_TABLET | Freq: Every day | ORAL | Status: DC
  Administered 2018-12-25 – 2018-12-26 (×2): 40 mg via ORAL
  Filled 2018-12-25 (×2): qty 1

## 2018-12-25 MED ORDER — ACETAMINOPHEN 650 MG RE SUPP: 650.0000 mg | Freq: Four times a day (QID) | RECTAL | Status: DC | PRN

## 2018-12-25 MED ORDER — ACETAMINOPHEN 325 MG PO TABS
650.0000 mg | ORAL_TABLET | Freq: Four times a day (QID) | ORAL | Status: DC | PRN
  Administered 2018-12-26 – 2018-12-30 (×5): 650 mg via ORAL
  Filled 2018-12-25 (×5): qty 2

## 2018-12-25 MED ORDER — LIDOCAINE HCL 1 % IJ SOLN
INTRAMUSCULAR | Status: AC
  Filled 2018-12-25: qty 20

## 2018-12-25 MED ORDER — SODIUM CHLORIDE 0.9 % IV SOLN
1.0000 g | INTRAVENOUS | Status: AC
  Administered 2018-12-25 – 2018-12-29 (×5): 1 g via INTRAVENOUS
  Filled 2018-12-25 (×4): qty 10

## 2018-12-25 MED ORDER — LOSARTAN POTASSIUM 50 MG PO TABS
50.0000 mg | ORAL_TABLET | Freq: Every day | ORAL | Status: DC
  Administered 2018-12-25 – 2018-12-27 (×3): 50 mg via ORAL
  Filled 2018-12-25 (×3): qty 1

## 2018-12-25 MED ORDER — ORAL CARE MOUTH RINSE
15.0000 mL | Freq: Two times a day (BID) | OROMUCOSAL | Status: DC
  Administered 2018-12-25 – 2018-12-31 (×12): 15 mL via OROMUCOSAL

## 2018-12-25 MED ORDER — ALPRAZOLAM 0.25 MG PO TABS
0.2500 mg | ORAL_TABLET | Freq: Two times a day (BID) | ORAL | Status: DC | PRN
  Administered 2018-12-26 – 2018-12-27 (×2): 0.25 mg via ORAL
  Filled 2018-12-25 (×2): qty 1

## 2018-12-25 MED ORDER — LOPERAMIDE HCL 2 MG PO CAPS
2.0000 mg | ORAL_CAPSULE | Freq: Once | ORAL | Status: AC
  Administered 2018-12-25: 2 mg via ORAL
  Filled 2018-12-25: qty 1

## 2018-12-25 MED ORDER — SODIUM CHLORIDE 0.9 % IV SOLN
INTRAVENOUS | Status: DC | PRN
  Administered 2018-12-25: 1000 mL via INTRAVENOUS

## 2018-12-25 MED ORDER — FUROSEMIDE 10 MG/ML IJ SOLN
40.0000 mg | Freq: Every day | INTRAMUSCULAR | Status: DC
  Administered 2018-12-25: 40 mg via INTRAVENOUS
  Filled 2018-12-25 (×3): qty 4

## 2018-12-25 MED ORDER — LIDOCAINE HCL 1 % IJ SOLN
INTRAMUSCULAR | Status: DC | PRN
  Administered 2018-12-25: 20 mL

## 2018-12-25 MED ORDER — AMIODARONE HCL 200 MG PO TABS
200.0000 mg | ORAL_TABLET | Freq: Every day | ORAL | Status: DC
  Administered 2018-12-25 – 2018-12-31 (×7): 200 mg via ORAL
  Filled 2018-12-25 (×7): qty 1

## 2018-12-25 NOTE — Progress Notes (Signed)
Patient dressing change has not been done during day shift, report given to night shift RN.

## 2018-12-25 NOTE — ED Notes (Addendum)
Pt had a bowel movement using a bedpan. Stool was runny, light brown in color. Pt states this BM was similar to what she has had all week.

## 2018-12-25 NOTE — Procedures (Signed)
PROCEDURE SUMMARY:  Successful image-guided right thoracentesis. Yielded 300 milliliters of golden fluid - procedure was aborted at this amount due to patient with prolonged, intense coughing and pain. Small amount of residual fluid remains on post procedure US examination. Patient tolerated procedure well. EBL: Zero No immediate complications.  Specimen was not sent for labs. Post procedure CXR shows no pneumothorax.  Please see imaging section of Epic for full dictation.  Joaquim Nam PA-C 12/25/2018 11:43 AM

## 2018-12-25 NOTE — H&P (Signed)
History and Physical    Beverly Tucker [id number] DOB: 08/10/1940 DOA: 12/24/2018  PCP: [name], MD Patient coming from: Home  Chief Complaint: Generalized weakness, lower extremity edema  HPI: Beverly Tucker is a 78 y.o. 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  Patient was admitted to the hospital 11/14 for HAP, episode of SVT, and acute on chronic diastolic congestive heart failure.  She was discharged home on 11/21.  Patient states since she left the hospital she has been very weak.  Yesterday while walking to the kitchen she felt short of breath.  She has not taken her chemotherapy drug for the past 3 days.  She sometimes feels nauseous.  She is concerned about the medications that were prescribed at the time of discharge.  Denies fevers, chills, chest pain, cough, vomiting, or abdominal pain.  She is using 2 L supplemental oxygen at home.  Reports having worsening bilateral lower extremity edema.  ED Course: Afebrile.  Satting well on 2 L supplemental oxygen (same as home requirement).  No leukocytosis.  Hemoglobin 10.1, stable compared to recent labs.  Blood glucose 103.  Creatinine 1.2, no significant change compared to recent labs.  High-sensitivity troponin 18.  EKG not suggestive of ACS.  BNP normal.  UA with large amount of leukocytes and greater than 50 WBCs.  SARS-CoV-2 test pending.  Chest x-ray showing stable left-sided pleural effusion with Pleurx catheter in place and slight increase in right-sided pleural effusion when compared to prior study.  Review of Systems:  All systems reviewed and  apart from history of presenting illness, are negative.  Past Medical History:  Diagnosis Date   Anemia    Cancer ([place])    Breast    Chronic diastolic CHF (congestive heart failure) (HCC)    CKD (chronic kidney disease), stage III    GERD (gastroesophageal reflux disease)    Hypertension    Malignant pleural effusion    Metastatic breast cancer (HCC)    PAF (paroxysmal atrial fibrillation) (HCC)    PSVT (paroxysmal supraventricular tachycardia) ([place])     Past Surgical History:  Procedure Laterality Date   CHOLECYSTECTOMY     IR PERC PLEURAL DRAIN W/INDWELL CATH W/IMG GUIDE  11/08/2018   IR THORACENTESIS ASP PLEURAL SPACE W/IMG GUIDE  10/22/2018     reports that she has never smoked. She has never used smokeless tobacco. She reports that she does not drink alcohol or use drugs.  No Known Allergies  Family History  Problem Relation Age of Onset   Diabetes Mellitus II Other        son    Prior to Admission medications   Medication Sig Start Date End Date Taking? Authorizing Provider  apixaban (ELIQUIS) 5 MG TABS tablet Take 1 tablet (5 mg total) by mouth 2 (two) times daily. 10/23/18  Yes Jeanmarie Hubert, MD  metoprolol tartrate (LOPRESSOR) 50 MG tablet Take 1 tablet (50 mg total) by mouth 2 (two) times daily. 12/21/18 01/20/19 Yes Donne Hazel, MD  ALPRAZolam Duanne Moron) 0.5 MG tablet Take 0.25-0.5 mg by mouth 2 (two) times daily as needed for anxiety. 05/31/17   [provider]  amiodarone (PACERONE) 200 MG tablet Take 1 tablet (200 mg total) by mouth daily.  12/20/18 01/19/19  Donne Hazel, MD  ergocalciferol (VITAMIN D2) 1.25 MG (50000 UT) capsule Take 1 capsule (50,000 Units total) by mouth once a week. 11/04/18   Brunetta Genera, MD  furosemide (LASIX) 40 MG tablet Take 1 tablet (40 mg total) by mouth daily. 11/23/18   Dessa Phi, DO  letrozole [facility]) 2.5 MG tablet Take 1 tablet (2.5 mg total) by mouth daily. 12/20/18 01/19/19  Donne Hazel, MD  loperamide (IMODIUM) 2 MG capsule Take 1 capsule (2 mg total) by mouth as needed for diarrhea or loose stools. 11/24/18   Dessa Phi, DO  losartan (COZAAR) 50 MG tablet Take 1 tablet (50 mg total) by mouth daily. 12/21/18 01/20/19  Donne Hazel, MD  ondansetron (ZOFRAN ODT) 4 MG disintegrating tablet Take 1 tablet (4 mg total) by mouth every 8 (eight) hours as needed for nausea or vomiting. 12/20/18   Donne Hazel, MD    Physical Exam: Vitals:   12/25/18 0033 12/25/18 0100 12/25/18 0130 12/25/18 0200  BP:  (!) 146/63 (!) 143/49 (!) 168/82  Pulse:  (!) 59 (!) 59 64  Resp:  20 (!) 28 17  Temp:      TempSrc:      SpO2:  100% 100% 100%  Weight: 80.1 kg     Height: 5\' 6"  (1.676 m)       Physical Exam  Constitutional: She is oriented to person, place, and time. She appears well-developed and well-nourished. No distress.  HENT:  Head: Normocephalic.  Eyes: Right eye exhibits no discharge. Left eye exhibits no discharge.  Neck: Neck supple.  Cardiovascular: Normal rate, regular rhythm and intact distal pulses.  Pulmonary/Chest: Effort normal. She has no wheezes.  On 2 L supplemental oxygen (same as home requirement) Decreased breath sounds at the bases  Abdominal: Soft. Bowel sounds are normal. She exhibits no distension. There is no abdominal tenderness. There is no guarding.  Musculoskeletal:        General: Edema present.     Comments: +2 to +3 pitting edema of bilateral lower extremities  Neurological: She is alert and oriented to person, place, and time.  Skin: Skin is warm and dry. She is not diaphoretic.     Labs on Admission: I have personally reviewed following labs and imaging studies  CBC: Recent Labs  Lab 12/20/18 0403 12/21/18 0418 12/24/18 1834  WBC 5.2 6.7 7.8  HGB 8.6* 9.6* 10.1*  HCT 29.7* 32.4* 33.8*  MCV 97.7 96.1 95.8  PLT 392 455* [id number]*   Basic Metabolic Panel: Recent Labs  Lab 12/18/18 0513 12/19/18 0455 12/20/18 0403  12/21/18 0418 12/24/18 1834  NA 138 139 142 137 139  K 3.9 3.6 3.7 3.7 4.0  CL 98 96* 98 93* 97*  CO2 31 33* 34* 34* 31  GLUCOSE 118* 102* 104* 114* 103*  BUN 9 8 10 12 22   CREATININE 0.89 0.97 1.03* 1.14* 1.27*  CALCIUM 7.9* 8.0* 8.2* 8.3* 8.6*   GFR: Estimated Creatinine Clearance: 38.3 mL/min (A) (by C-G formula based on SCr of 1.27 mg/dL (H)). Liver Function Tests: Recent Labs  Lab 12/21/18 0418  AST 14*  ALT 12  ALKPHOS 75  BILITOT 0.5  PROT 5.6*  ALBUMIN 2.6*   No results for input(s): LIPASE, AMYLASE in the last 168 hours. No results for input(s): AMMONIA in the last 168 hours. Coagulation Profile: No results for input(s): INR, PROTIME in the last 168 hours. Cardiac Enzymes: No results for input(s): CKTOTAL, CKMB, CKMBINDEX,  TROPONINI in the last 168 hours. BNP (last 3 results) No results for input(s): PROBNP in the last 8760 hours. HbA1C: No results for input(s): HGBA1C in the last 72 hours. CBG: Recent Labs  Lab 12/24/18 2335  GLUCAP 85   Lipid Profile: No results for input(s): CHOL, HDL, LDLCALC, TRIG, CHOLHDL, LDLDIRECT in the last 72 hours. Thyroid Function Tests: No results for input(s): TSH, T4TOTAL, FREET4, T3FREE, THYROIDAB in the last 72 hours. Anemia Panel: No results for input(s): VITAMINB12, FOLATE, FERRITIN, TIBC, IRON, RETICCTPCT in the last 72 hours. Urine analysis:    Component Value Date/Time   COLORURINE YELLOW 12/25/2018 0111   APPEARANCEUR HAZY (A) 12/25/2018 0111   LABSPEC 1.015 12/25/2018 0111   PHURINE 5.0 12/25/2018 0111   GLUCOSEU NEGATIVE 12/25/2018 0111   HGBUR NEGATIVE 12/25/2018 0111   BILIRUBINUR NEGATIVE 12/25/2018 0111   KETONESUR 5 (A) 12/25/2018 0111   PROTEINUR NEGATIVE 12/25/2018 0111   NITRITE NEGATIVE 12/25/2018 0111   LEUKOCYTESUR LARGE (A) 12/25/2018 0111    Radiological Exams on Admission: Dg Chest 2 View  Result Date: 12/25/2018 CLINICAL DATA:  History of community-acquired pneumonia. EXAM: CHEST - 2  VIEW COMPARISON:  12/14/2018 FINDINGS: Cardiac shadow is enlarged but stable. Aortic calcifications are seen. Left-sided PleurX catheter is again noted. Stable left pleural effusion is seen. Slight increase in right-sided effusion when compare with the prior study is noted. There is likely underlying atelectasis/infiltrate present. No bony abnormality is seen. IMPRESSION: Increasing right-sided pleural effusion when compare with the prior study. Electronically Signed   By: Inez Catalina M.D.   On: 12/25/2018 00:22    EKG: Independently reviewed.  Sinus rhythm, borderline PR prolongation.  Assessment/Plan Principal Problem:   Pleural effusion Active Problems:   Metastatic breast cancer (HCC)   PSVT (paroxysmal supraventricular tachycardia) (HCC)   UTI (urinary tract infection)   Generalized weakness   Worsening right-sided pleural effusion, suspect malignant Patient with history of metastatic breast cancer and recurrent left-sided malignant pleural effusion status post Pleurx drain.  Chest x-ray showing stable left-sided pleural effusion with Pleurx catheter in place and slight increase in right-sided pleural effusion when compared to prior study.  Patient is currently satting well on 2 L supplemental oxygen (same as home requirement).  No increased work of breathing. -Order placed for IR guided thoracentesis.  May also possibly need a Pleurx catheter on the right side. -Hold home Eliquis  UTI UA with signs of infection.  Afebrile no leukocytosis.  No signs of sepsis. -Ceftriaxone -Urine culture  Generalized weakness Likely secondary to UTI and worsening pleural effusion. -Management as above -PT evaluation  Chronic diastolic congestive heart failure BNP normal.  Echo done 11/15 with mild diastolic dysfunction.  Has +2 to +3 pitting edema bilateral lower extremities.  Pleural effusions are likely malignant in etiology.  Lasix 40 mg listed in home medications, unclear if patient has been  taking it since hospital discharge. -IV Lasix 40 mg daily -Monitor intake and output, daily weights, low-sodium diet with fluid restriction  Borderline troponin elevation High-sensitivity troponin 18.  EKG not suggestive of ACS.  Patient denies chest pain and appears comfortable.  -Cardiac monitoring -Check second set of troponin  Metastatic breast cancer Currently on Femara.  Patient was previously on Verzenio which was held during prior hospitalization as she developed nausea and diarrhea.  Chest imaging with worsening pleural effusions. -Consult oncology in a.m.  Paroxysmal atrial fibrillation/ PSVT -Hold Eliquis at this time in anticipation of procedure in the morning -Pharmacy med rec  pending -Monitor electrolytes  Pharmacy med rec pending.  DVT prophylaxis: SCDs at this time Code Status: Patient wishes to be full code. Family Communication: No family available. Disposition Plan: Anticipate discharge after clinical improvement. Consults called: None Admission status: It is my clinical opinion that referral for OBSERVATION is reasonable and necessary in this patient based on the above information provided. The aforementioned taken together are felt to place the patient at high risk for further clinical deterioration. However it is anticipated that the patient may be medically stable for discharge from the hospital within 24 to 48 hours.  The medical decision making on this patient was of high complexity and the patient is at high risk for clinical deterioration, therefore this is a level 3 visit.  Beverly Leff MD [facility] Pager [phone]  If 7PM-7AM, please contact night-coverage [email or web address] Password [facility]  12/25/2018, 3:43 AM

## 2018-12-25 NOTE — Evaluation (Signed)
Physical Therapy Evaluation Patient Details Name: Beverly Tucker MRN: [id number] DOB: 1940-07-20 Today's Date: 12/25/2018   History of Present Illness  78 y.o. 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  Clinical Impression  Pt demonstrates deficits in functional mobility, gait, balance, power, and endurance. Pt with increased work of breathing throughout session, desaturating at rest on room air and requiring 2L  to ambulate limited household distances. Pt reports feeling weak and fatiguing quickly with all activity. Pt demonstrates increased sway and the need for UE support to reduce falls risk during OOB activity. Pt will benefit from continued acute PT services to reduce falls risk and aide in a return to their PLOF.    Follow Up Recommendations Home health PT    Equipment Recommendations  (continue to assess, may benefit from Rollator)    Recommendations for Other Services       Precautions / Restrictions Precautions Precautions: Fall Restrictions Weight Bearing Restrictions: No      Mobility  Bed Mobility Overal bed mobility: (pt up in recliner upon arrival)                Transfers Overall transfer level: Needs assistance Equipment used: None Transfers: Sit to/from Stand Sit to Stand: Supervision            Ambulation/Gait Ambulation/Gait assistance: Min guard Gait Distance (Feet): 40 Feet Assistive device: 1 person hand held assist Gait Pattern/deviations: Step-to pattern Gait velocity: reduced Gait velocity interpretation: <1.8 ft/sec, indicate of risk for recurrent  falls General Gait Details: short step to pattern with increased sway  Stairs            Wheelchair Mobility    Modified Rankin (Stroke Patients Only)       Balance Overall balance assessment: Needs assistance Sitting-balance support: Feet supported;No upper extremity supported Sitting balance-Leahy Scale: Good Sitting balance - Comments: supervision   Standing balance support: Single extremity supported Standing balance-Leahy Scale: Fair Standing balance comment: minG with HHA                             Pertinent Vitals/Pain Pain Assessment: No/denies pain    Home Living Family/patient expects to be discharged to:: Private residence Living Arrangements: Alone Available Help at Discharge: Family;Available 24 hours/day(24/7 temporarily) Type of Home: House Home Access: Stairs to enter Entrance Stairs-Rails: Can reach both Entrance Stairs-Number of Steps: 3 Home Layout: One level Home Equipment: Walker - 2 wheels;Shower seat;Cane - single point      Prior Function Level of Independence: Needs assistance   Gait / Transfers Assistance Needed: Independent for ambulation  ADL's / Homemaking Assistance Needed: showers with supervision and relies on family for heavy meal prep and housekeeping        Hand Dominance   Dominant Hand: Right    Extremity/Trunk Assessment   Upper Extremity Assessment Upper Extremity Assessment: Generalized weakness    Lower Extremity Assessment Lower Extremity Assessment: Generalized weakness    Cervical / Trunk Assessment Cervical / Trunk Assessment: Kyphotic  Communication   Communication: No difficulties  Cognition Arousal/Alertness: Awake/alert Behavior During Therapy: WFL for tasks assessed/performed Overall Cognitive Status: Within Functional Limits for tasks assessed  General Comments General comments (skin integrity, edema, etc.): pt on 2L [place]  during mobility, with sats ranging from 91-93%. Pt desaturating to 88% on room air at rest.    Exercises     Assessment/Plan    PT Assessment Patient needs continued PT services  PT Problem List Decreased strength;Decreased activity tolerance;Decreased balance;Decreased mobility;Decreased knowledge of use of DME;Decreased safety awareness;Cardiopulmonary status limiting activity       PT Treatment Interventions DME instruction;Gait training;Stair training;Functional mobility training;Therapeutic activities;Therapeutic exercise;Balance training;Neuromuscular re-education;Patient/family education    PT Goals (Current goals can be found in the Care Plan section)  Acute Rehab PT Goals Patient Stated Goal: to improve breathing PT Goal Formulation: With patient Time For Goal Achievement: 01/08/19 Potential to Achieve Goals: Fair    Frequency Min 3X/week   Barriers to discharge        Co-evaluation               AM-PAC PT "6 Clicks" Mobility  Outcome Measure Help needed turning from your back to your side while in a flat bed without using bedrails?: A Little Help needed moving from lying on your back to sitting on the side of a flat bed without using bedrails?: A Little Help needed moving to and from a bed to a chair (including a wheelchair)?: A Little Help needed standing up from a chair using your arms (e.g., wheelchair or bedside chair)?: None Help needed to walk in hospital room?: A Little Help needed climbing 3-5 steps with a railing? : A Lot 6 Click Score: 18    End of Session Equipment Utilized During Treatment: Oxygen Activity Tolerance: Patient limited by fatigue Patient left: in chair;with call bell/phone within reach;with chair alarm set Nurse Communication: Mobility status PT Visit Diagnosis: Unsteadiness on feet (R26.81);Muscle weakness (generalized) (M62.81)    Time: [id number] PT Time Calculation (min) (ACUTE ONLY): 22 min   Charges:   PT  Evaluation $PT Eval Moderate Complexity: 1 Mod          Zenaida Niece, PT, DPT Acute Rehabilitation Pager: [phone]   Zenaida Niece 12/25/2018, 9:58 AM

## 2018-12-25 NOTE — ED Provider Notes (Signed)
Patient seen/examined in the Emergency Department in conjunction with Advanced Practice Provider [place] Patient reports generalized weakness Exam : awake/alert, appears chronically ill, but no acute distress Plan: labs pending, but if continues to feel weak may need admission CXR reveals worsened pleural effusion     Ripley Fraise, MD 12/25/18 0101

## 2018-12-25 NOTE — Progress Notes (Addendum)
Patient admitted to OBS after midnight. Care began before midnight.   78 yo hx metastatic breast cancer recently discharged from hospital for pna, acute on chronic HF, recurrent pleural effusion, SVT presents back to ED after being home for 2 days with worsening DOE, edema and generalized weakness. Work up reveals UTI, worsening right pleural effusion and stable left effusion with pleurx cath intact. She is provided with IV lasix, rocephin and awaiting thoracentesis.   PE Gen: sitting in chair, no acute distress. Complains worsening sob with ambulation to BR CV: rrr no mgr trace LE edema Resp. No increased work of breathing. BS distant no crackles/wheezes. Left pleurx cath intact.      Lezlie Octave black, NP  Will need to continue to monitor how quickly the right fluid returns. Thoracentesis done today.  Doubt heart failure- BNP: 68.  Suspect related to known cancer (has pleurex on left ) Eulogio Bear DO

## 2018-12-25 NOTE — Telephone Encounter (Signed)
See notes below, pt agreed to vritual visit.     Virtual Visit Pre-Appointment Phone Call  "(Name), I am calling you today to discuss your upcoming appointment. We are currently trying to limit exposure to the virus that causes COVID-19 by seeing patients at home rather than in the office."  1. "What is the BEST phone number to call the day of the visit?" - include this in appointment notes  2. "Do you have or have access to (through a family member/friend) a smartphone with video capability that we can use for your visit?" a. If yes - list this number in appt notes as "cell" (if different from BEST phone #) and list the appointment type as a VIDEO visit in appointment notes b. If no - list the appointment type as a PHONE visit in appointment notes  3. Confirm consent - "In the setting of the current Covid19 crisis, you are scheduled for a (phone or video) visit with your provider on (date) at (time).  Just as we do with many in-office visits, in order for you to participate in this visit, we must obtain consent.  If you'd like, I can send this to your mychart (if signed up) or email for you to review.  Otherwise, I can obtain your verbal consent now.  All virtual visits are billed to your insurance company just like a normal visit would be.  By agreeing to a virtual visit, we'd like you to understand that the technology does not allow for your provider to perform an examination, and thus may limit your provider's ability to fully assess your condition. If your provider identifies any concerns that need to be evaluated in person, we will make arrangements to do so.  Finally, though the technology is pretty good, we cannot assure that it will always work on either your or our end, and in the setting of a video visit, we may have to convert it to a phone-only visit.  In either situation, we cannot ensure that we have a secure connection.  Are you willing to proceed?" STAFF: Did the patient verbally  acknowledge consent to telehealth visit? Document YES/NO here: YES PT GAVE LYNN VIA VERBAL PERMISSION.. SEE BELOW  4. Advise patient to be prepared - "Two hours prior to your appointment, go ahead and check your blood pressure, pulse, oxygen saturation, and your weight (if you have the equipment to check those) and write them all down. When your visit starts, your provider will ask you for this information. If you have an Apple Watch or Kardia device, please plan to have heart rate information ready on the day of your appointment. Please have a pen and paper handy nearby the day of the visit as well."  5. Give patient instructions for MyChart download to smartphone OR Doximity/Doxy.me as below if video visit (depending on what platform provider is using)  6. Inform patient they will receive a phone call 15 minutes prior to their appointment time (may be from unknown caller ID) so they should be prepared to answer    TELEPHONE CALL NOTE  Trinetta E Ax has been deemed a candidate for a follow-up tele-health visit to limit community exposure during the Covid-19 pandemic. I spoke with the patient via phone to ensure availability of phone/video source, confirm preferred email & phone number, and discuss instructions and expectations.  I reminded TANDREA MANDELLA to be prepared with any vital sign and/or heart rhythm information that could potentially be obtained via home monitoring,  at the time of her visit. I reminded IFUNANYA FIERO to expect a phone call prior to her visit.  Jeanann Lewandowsky, [place] 12/25/2018 8:55 AM   INSTRUCTIONS FOR DOWNLOADING THE MYCHART APP TO SMARTPHONE  - The patient must first make sure to have activated MyChart and know their login information - If Apple, go to [employer] and type in MyChart in the search bar and download the app. If Android, ask patient to go to [employer] and type in [place] in the search bar and download the app. The app is free but as with any  other app downloads, their phone may require them to verify saved payment information or Apple/Android password.  - The patient will need to then log into the app with their MyChart username and password, and select [facility] as their healthcare provider to link the account. When it is time for your visit, go to the MyChart app, find appointments, and click Begin Video Visit. Be sure to Select Allow for your device to access the Microphone and Camera for your visit. You will then be connected, and your provider will be with you shortly.  **If they have any issues connecting, or need assistance please contact MyChart service desk [phone])**  **If using a computer, in order to ensure the best quality for their visit they will need to use either of the following Internet Browsers: [employer], or Google Chrome**  IF USING DOXIMITY or DOXY.ME - The patient will receive a link just prior to their visit by text.     FULL LENGTH CONSENT FOR TELE-HEALTH VISIT   I hereby voluntarily request, consent and authorize [facility] and its employed or contracted physicians, physician assistants, nurse practitioners or other licensed health care professionals (the Practitioner), to provide me with telemedicine health care services (the "Services") as deemed necessary by the treating Practitioner. I acknowledge and consent to receive the Services by the Practitioner via telemedicine. I understand that the telemedicine visit will involve communicating with the Practitioner through live audiovisual communication technology and the disclosure of certain medical information by electronic transmission. I acknowledge that I have been given the opportunity to request an in-person assessment or other available alternative prior to the telemedicine visit and am voluntarily participating in the telemedicine visit.  I understand that I have the right to withhold or withdraw my consent to the use of  telemedicine in the course of my care at any time, without affecting my right to future care or treatment, and that the Practitioner or I may terminate the telemedicine visit at any time. I understand that I have the right to inspect all information obtained and/or recorded in the course of the telemedicine visit and may receive copies of available information for a reasonable fee.  I understand that some of the potential risks of receiving the Services via telemedicine include:  Marland Kitchen Delay or interruption in medical evaluation due to technological equipment failure or disruption; . Information transmitted may not be sufficient (e.g. poor resolution of images) to allow for appropriate medical decision making by the Practitioner; and/or  . In rare instances, security protocols could fail, causing a breach of personal health information.  Furthermore, I acknowledge that it is my responsibility to provide information about my medical history, conditions and care that is complete and accurate to the best of my ability. I acknowledge that Practitioner's advice, recommendations, and/or decision may be based on factors not within their control, such as incomplete or inaccurate  data provided by me or distortions of diagnostic images or specimens that may result from electronic transmissions. I understand that the practice of medicine is not an exact science and that Practitioner makes no warranties or guarantees regarding treatment outcomes. I acknowledge that I will receive a copy of this consent concurrently upon execution via email to the email address I last provided but may also request a printed copy by calling the office of [facility].    I understand that my insurance will be billed for this visit.   I have read or had this consent read to me. . I understand the contents of this consent, which adequately explains the benefits and risks of the Services being provided via telemedicine.  . I have been  provided ample opportunity to ask questions regarding this consent and the Services and have had my questions answered to my satisfaction. . I give my informed consent for the services to be provided through the use of telemedicine in my medical care  By participating in this telemedicine visit I agree to the above.

## 2018-12-25 NOTE — ED Notes (Addendum)
While ambulating the short distance from the bed to nurses station, with O2, the pt became quickly exacerbated and short of breath. When returning to the bed, the pt was 93% on 2L O2. Pt then returned to 100% once settled in bed, but was still breathing approximately 30x a minute. Pt was upset about how she felt, stating "I just feel so unsteady, woozy on my feet."

## 2018-12-25 NOTE — Consult Note (Signed)
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 Dressing procedure/placement/frequency:  single layer of xeroform over breast lesions/ top with DRY ABD pads. Secure with paper tape or breast supporter from materials   Discussed POC with  bedside nurse.  Re consult if needed, will not follow at this time. Thanks  Anay Rathe [employer], RN,CWOCN, CNS, [place] [phone])

## 2018-12-25 NOTE — Progress Notes (Addendum)
New admit to the floor.  Pt NPO but requesting juice.  RN informed pt of NPO status.  Pt requested RN to ask provider if she could have one cup of juice.    TRH returned page and stated patient could not have anything to eat or drink but could have a small amount of ice/drink to swab mouth with.  RN gave this to patient and informed her to only swab her mouth with it.

## 2018-12-26 ENCOUNTER — Inpatient Hospital Stay (HOSPITAL_COMMUNITY): Payer: Medicare HMO

## 2018-12-26 DIAGNOSIS — G4700 Insomnia, unspecified: Secondary | ICD-10-CM | POA: Diagnosis not present

## 2018-12-26 DIAGNOSIS — N1832 Chronic kidney disease, stage 3b: Secondary | ICD-10-CM | POA: Diagnosis present

## 2018-12-26 DIAGNOSIS — F419 Anxiety disorder, unspecified: Secondary | ICD-10-CM | POA: Diagnosis present

## 2018-12-26 DIAGNOSIS — I471 Supraventricular tachycardia: Secondary | ICD-10-CM | POA: Diagnosis present

## 2018-12-26 DIAGNOSIS — N1831 Chronic kidney disease, stage 3a: Secondary | ICD-10-CM | POA: Diagnosis not present

## 2018-12-26 DIAGNOSIS — R531 Weakness: Secondary | ICD-10-CM | POA: Diagnosis not present

## 2018-12-26 DIAGNOSIS — I5032 Chronic diastolic (congestive) heart failure: Secondary | ICD-10-CM | POA: Diagnosis present

## 2018-12-26 DIAGNOSIS — C7951 Secondary malignant neoplasm of bone: Secondary | ICD-10-CM | POA: Diagnosis present

## 2018-12-26 DIAGNOSIS — Z515 Encounter for palliative care: Secondary | ICD-10-CM | POA: Diagnosis present

## 2018-12-26 DIAGNOSIS — I951 Orthostatic hypotension: Secondary | ICD-10-CM | POA: Diagnosis not present

## 2018-12-26 DIAGNOSIS — J910 Malignant pleural effusion: Secondary | ICD-10-CM | POA: Diagnosis present

## 2018-12-26 DIAGNOSIS — Z7901 Long term (current) use of anticoagulants: Secondary | ICD-10-CM | POA: Diagnosis not present

## 2018-12-26 DIAGNOSIS — I480 Paroxysmal atrial fibrillation: Secondary | ICD-10-CM | POA: Diagnosis present

## 2018-12-26 DIAGNOSIS — D631 Anemia in chronic kidney disease: Secondary | ICD-10-CM | POA: Diagnosis present

## 2018-12-26 DIAGNOSIS — K219 Gastro-esophageal reflux disease without esophagitis: Secondary | ICD-10-CM | POA: Diagnosis present

## 2018-12-26 DIAGNOSIS — Z7189 Other specified counseling: Secondary | ICD-10-CM | POA: Diagnosis not present

## 2018-12-26 DIAGNOSIS — N390 Urinary tract infection, site not specified: Secondary | ICD-10-CM | POA: Diagnosis present

## 2018-12-26 DIAGNOSIS — I440 Atrioventricular block, first degree: Secondary | ICD-10-CM | POA: Diagnosis present

## 2018-12-26 DIAGNOSIS — Z20828 Contact with and (suspected) exposure to other viral communicable diseases: Secondary | ICD-10-CM | POA: Diagnosis present

## 2018-12-26 DIAGNOSIS — I130 Hypertensive heart and chronic kidney disease with heart failure and stage 1 through stage 4 chronic kidney disease, or unspecified chronic kidney disease: Secondary | ICD-10-CM | POA: Diagnosis present

## 2018-12-26 DIAGNOSIS — J90 Pleural effusion, not elsewhere classified: Secondary | ICD-10-CM | POA: Diagnosis not present

## 2018-12-26 DIAGNOSIS — D630 Anemia in neoplastic disease: Secondary | ICD-10-CM | POA: Diagnosis present

## 2018-12-26 DIAGNOSIS — E440 Moderate protein-calorie malnutrition: Secondary | ICD-10-CM | POA: Diagnosis present

## 2018-12-26 DIAGNOSIS — D649 Anemia, unspecified: Secondary | ICD-10-CM | POA: Diagnosis present

## 2018-12-26 DIAGNOSIS — C50919 Malignant neoplasm of unspecified site of unspecified female breast: Secondary | ICD-10-CM | POA: Diagnosis present

## 2018-12-26 DIAGNOSIS — R609 Edema, unspecified: Secondary | ICD-10-CM | POA: Diagnosis present

## 2018-12-26 DIAGNOSIS — T451X5A Adverse effect of antineoplastic and immunosuppressive drugs, initial encounter: Secondary | ICD-10-CM | POA: Diagnosis present

## 2018-12-26 DIAGNOSIS — R001 Bradycardia, unspecified: Secondary | ICD-10-CM | POA: Diagnosis not present

## 2018-12-26 DIAGNOSIS — J9621 Acute and chronic respiratory failure with hypoxia: Secondary | ICD-10-CM | POA: Diagnosis present

## 2018-12-26 LAB — URINE CULTURE

## 2018-12-26 NOTE — Progress Notes (Addendum)
Progress Note    Beverly Tucker  [id number] DOB: 06-21-1940  DOA: 12/24/2018 PCP: [name], MD    Brief Narrative:     Medical records reviewed and are as summarized below:  Beverly Tucker is an 78 y.o. female with medical history significant of metastatic breast cancer on oral chemotherapy, recurrent malignant left-sided pleural effusion status post Pleurx catheter placement, pneumothorax, chronic diastolic congestive heart failure, CKD stage III, hypertension, paroxysmal atrial fibrillation, paroxysmal supraventricular tachycardia presenting to the hospital with complaints of generalized weakness.  Patient was admitted to the hospital 11/14 for HAP, episode of SVT, and acute on chronic diastolic congestive heart failure.  She was discharged home on 11/21.  Patient states since she left the hospital she has been very weak.  Found to be orthostatic as well as have a right pleural effusion.  Assessment/Plan:   Principal Problem:   Pleural effusion Active Problems:   CKD (chronic kidney disease) stage 3, GFR 30-59 ml/min   Metastatic breast cancer (HCC)   Malignant pleural effusion   PSVT (paroxysmal supraventricular tachycardia) (HCC)   Chronic diastolic HF (heart failure) (HCC)   UTI (urinary tract infection)   Generalized weakness   Orthostatic hypotension -hold lasix -was given IV lasix upon admission and think she may have been diuresed too much -will add TED hose and recheck in AM -will avoid IVF for now so we do not cause volume overload   right-sided pleural effusion, suspect malignant Patient with history of metastatic breast cancer and recurrent left-sided malignant pleural effusion status post Pleurx drain.  Chest x-ray showing stable left-sided pleural effusion with Pleurx catheter in place and slight increase in right-sided pleural effusion when compared to prior study.  Patient is currently satting well on 2 L supplemental oxygen (same as home  requirement).  -s/p thoracentesis -dg chest today  Generalized weakness -could be related to orthostatics vs deconditioning (wsa just d/c'd from hospital)  Chronic diastolic congestive heart failure BNP normal.  Echo done 11/15 with mild diastolic dysfunction.  Has +2 to +3 pitting edema bilateral lower extremities.  Pleural effusions are likely malignant in etiology.  Lasix 40 mg listed in home medications, unclear if patient has been taking it since hospital discharge. -hold lasix for now  Metastatic breast cancer Currently on Femara.  Patient was previously on Verzenio which was held during prior hospitalization as she developed nausea and diarrhea.  Chest imaging with worsening pleural effusions. -added Dr. [name] to the treatment team as an FYI  Paroxysmal atrial fibrillation/ PSVT -resume home meds   Family Communication/Anticipated D/C date and plan/Code Status   DVT prophylaxis: eliquis Code Status: Full Code.  Family Communication:  Disposition Plan:    Medical Consultants:    None.    Subjective:   Got dizzy when she stood up this AM to go to the bathroom  Objective:    Vitals:   12/26/18 0018 12/26/18 0504 12/26/18 0849 12/26/18 1000  BP: (!) 110/55 (!) 109/40 (!) 111/46   Pulse: (!) 52 (!) 52 75 (!) 58  Resp: 18 18 20    Temp: 98.2 F (36.8 C) 98.9 F (37.2 C) 98.4 F (36.9 C)   TempSrc: Oral Oral Oral   SpO2: 99% 97%    Weight:  78 kg    Height:        Intake/Output Summary (Last 24 hours) at 12/26/2018 1230 Last data filed at 12/26/2018 0900 Gross per 24 hour  Intake 340 ml  Output --  Net 340 ml   Filed Weights   12/25/18 0033 12/25/18 0356 12/26/18 0504  Weight: 80.1 kg 79.1 kg 78 kg    Exam: In chair, chronically ill appearing but not toxic No increased work of breathing, on 2L Min LE edema +BS, soft, NT  Data Reviewed:   I have personally reviewed following labs and imaging studies:  Labs: Labs show the following:    Basic Metabolic Panel: Recent Labs  Lab 12/20/18 0403 12/21/18 0418 12/24/18 1834 12/25/18 0410  NA 142 137 139  --   K 3.7 3.7 4.0  --   CL 98 93* 97*  --   CO2 34* 34* 31  --   GLUCOSE 104* 114* 103*  --   BUN 10 12 22   --   CREATININE 1.03* 1.14* 1.27*  --   CALCIUM 8.2* 8.3* 8.6*  --   MG  --   --   --  2.2   GFR Estimated Creatinine Clearance: 37.1 mL/min (A) (by C-G formula based on SCr of 1.27 mg/dL (H)). Liver Function Tests: Recent Labs  Lab 12/21/18 0418  AST 14*  ALT 12  ALKPHOS 75  BILITOT 0.5  PROT 5.6*  ALBUMIN 2.6*   No results for input(s): LIPASE, AMYLASE in the last 168 hours. No results for input(s): AMMONIA in the last 168 hours. Coagulation profile No results for input(s): INR, PROTIME in the last 168 hours.  CBC: Recent Labs  Lab 12/20/18 0403 12/21/18 0418 12/24/18 1834  WBC 5.2 6.7 7.8  HGB 8.6* 9.6* 10.1*  HCT 29.7* 32.4* 33.8*  MCV 97.7 96.1 95.8  PLT 392 455* 431*   Cardiac Enzymes: No results for input(s): CKTOTAL, CKMB, CKMBINDEX, TROPONINI in the last 168 hours. BNP (last 3 results) No results for input(s): PROBNP in the last 8760 hours. CBG: Recent Labs  Lab 12/24/18 2335  GLUCAP 85   D-Dimer: No results for input(s): DDIMER in the last 72 hours. Hgb A1c: No results for input(s): HGBA1C in the last 72 hours. Lipid Profile: No results for input(s): CHOL, HDL, LDLCALC, TRIG, CHOLHDL, LDLDIRECT in the last 72 hours. Thyroid function studies: No results for input(s): TSH, T4TOTAL, T3FREE, THYROIDAB in the last 72 hours.  Invalid input(s): FREET3 Anemia work up: No results for input(s): VITAMINB12, FOLATE, FERRITIN, TIBC, IRON, RETICCTPCT in the last 72 hours. Sepsis Labs: Recent Labs  Lab 12/20/18 0403 12/21/18 0418 12/24/18 1834  WBC 5.2 6.7 7.8    Microbiology Recent Results (from the past 240 hour(s))  Urine Culture     Status: Abnormal   Collection Time: 12/25/18  1:11 AM   Specimen: Urine, Random   Result Value Ref Range Status   Specimen Description URINE, RANDOM  Final   Special Requests   Final    NONE Performed at [facility], [address], [place], [place] [address]    Culture MULTIPLE SPECIES PRESENT, SUGGEST RECOLLECTION (A)  Final   Report Status 12/26/2018 FINAL  Final  SARS CORONAVIRUS 2 (TAT 6-24 HRS) Nasopharyngeal Nasopharyngeal Swab     Status: None   Collection Time: 12/25/18  2:19 AM   Specimen: Nasopharyngeal Swab  Result Value Ref Range Status   SARS Coronavirus 2 NEGATIVE NEGATIVE Final    Comment: (NOTE) SARS-CoV-2 target nucleic acids are NOT DETECTED. The SARS-CoV-2 RNA is generally detectable in upper and lower respiratory specimens during the acute phase of infection. Negative results do not preclude SARS-CoV-2 infection, do not rule out co-infections with other pathogens, and  should not be used as the sole basis for treatment or other patient management decisions. Negative results must be combined with clinical observations, patient history, and epidemiological information. The expected result is Negative. Fact Sheet for Patients: SugarRoll.be Fact Sheet for Healthcare Providers: https://www.woods-mathews.com/ This test is not yet approved or cleared by the [place] FDA and  has been authorized for detection and/or diagnosis of SARS-CoV-2 by FDA under an Emergency Use Authorization (EUA). This EUA will remain  in effect (meaning this test can be used) for the duration of the COVID-19 declaration under Section 56 4(b)(1) of the Act, 21 U.S.C. section 360bbb-3(b)(1), unless the authorization is terminated or revoked sooner. Performed at [facility], [place] [address] [place], [place] [address]     Procedures and diagnostic studies:  Dg Chest 1 View  Result Date: 12/25/2018 CLINICAL DATA:  Status post right-sided thoracentesis. EXAM: CHEST  1 VIEW COMPARISON:  Same day. FINDINGS:  Stable cardiomediastinal silhouette. Left-sided chest tube is unchanged in position. No pneumothorax is noted. Right pleural effusion is slightly smaller. Stable left pleural effusion is noted. Increased left perihilar opacity is noted concerning for possible inflammation or atelectasis. Bony thorax is unremarkable. IMPRESSION: 1. Right-sided chest tube is unchanged in position. Right pleural effusion is slightly smaller. Stable left pleural effusion. Increased left perihilar opacity is noted concerning for inflammation or atelectasis. 2. No pneumothorax seen. Electronically Signed   By: Marijo Conception M.D.   On: 12/25/2018 11:59   Dg Chest 2 View  Result Date: 12/25/2018 CLINICAL DATA:  History of community-acquired pneumonia. EXAM: CHEST - 2 VIEW COMPARISON:  12/14/2018 FINDINGS: Cardiac shadow is enlarged but stable. Aortic calcifications are seen. Left-sided PleurX catheter is again noted. Stable left pleural effusion is seen. Slight increase in right-sided effusion when compare with the prior study is noted. There is likely underlying atelectasis/infiltrate present. No bony abnormality is seen. IMPRESSION: Increasing right-sided pleural effusion when compare with the prior study. Electronically Signed   By: Inez Catalina M.D.   On: 12/25/2018 00:22   Ir Thoracentesis Asp Pleural Space W/img Guide  Result Date: 12/25/2018 INDICATION: Patient with history of metastatic breast cancer, recurrent left-sided effusion status post PleurX catheter placement. Chest x-ray shows new right pleural effusion. Request to IR for therapeutic thoracentesis. EXAM: ULTRASOUND GUIDED RIGHT THORACENTESIS MEDICATIONS: 10 mL 1% lidocaine COMPLICATIONS: None immediate. PROCEDURE: An ultrasound guided thoracentesis was thoroughly discussed with the patient and questions answered. The benefits, risks, alternatives and complications were also discussed. The patient understands and wishes to proceed with the procedure. Written  consent was obtained. Ultrasound was performed to localize and mark an adequate pocket of fluid in the right chest. The area was then prepped and draped in the normal sterile fashion. 1% Lidocaine was used for local anesthesia. Under ultrasound guidance a 6 Fr Safe-T-Centesis catheter was introduced. Thoracentesis was performed. The catheter was removed and a dressing applied. FINDINGS: A total of approximately 300 mL of golden fluid was removed. IMPRESSION: Successful ultrasound guided right thoracentesis yielding 300 mL of pleural fluid. Read by Candiss Norse, PA-C Electronically Signed   By: Aletta Edouard M.D.   On: 12/25/2018 11:58    Medications:    amiodarone  200 mg Oral Daily   apixaban  5 mg Oral BID   furosemide  40 mg Oral Daily   losartan  50 mg Oral Daily   mouth rinse  15 mL Mouth Rinse BID   metoprolol tartrate  50 mg Oral BID  sodium chloride flush  3 mL Intravenous Once   Continuous Infusions:  sodium chloride 1,000 mL (12/25/18 0438)   cefTRIAXone (ROCEPHIN)  IV 1 g (12/26/18 0329)     LOS: 0 days   Geradine Girt  [facility]   How to contact the [facility] Attending or Consulting provider [place] or covering provider during after hours [place], for this patient?  1. Check the care team in [facility] and look for a) attending/consulting [facility] provider listed and b) the [facility] team listed 2. Log into [email or web address] and use [facility] universal password to access. If you do not have the password, please contact the hospital operator. 3. Locate the [facility] provider you are looking for under [facility] and page to a number that you can be directly reached. 4. If you still have difficulty reaching the provider, please page the [facility] (Director on Call) for the Hospitalists listed on amion for assistance.  12/26/2018, 12:30 PM

## 2018-12-26 NOTE — Progress Notes (Signed)
Pt is refusing SCDs for VTE prophylaxis at this moment.

## 2018-12-26 NOTE — Progress Notes (Signed)
Patient wound care has been completed by 7pm - 11pm  Nurse per report. Drsg is dry and intact. No drainage is noted at this time. Denies any pain or discomfort.

## 2018-12-27 DIAGNOSIS — J910 Malignant pleural effusion: Secondary | ICD-10-CM

## 2018-12-27 DIAGNOSIS — I5032 Chronic diastolic (congestive) heart failure: Secondary | ICD-10-CM

## 2018-12-27 DIAGNOSIS — C50919 Malignant neoplasm of unspecified site of unspecified female breast: Secondary | ICD-10-CM

## 2018-12-27 DIAGNOSIS — R531 Weakness: Secondary | ICD-10-CM

## 2018-12-27 LAB — CBC
HCT: 28.7 % — ABNORMAL LOW (ref 36.0–46.0)
Hemoglobin: 8.6 g/dL — ABNORMAL LOW (ref 12.0–15.0)
MCH: 28.4 pg (ref 26.0–34.0)
MCHC: 30 g/dL (ref 30.0–36.0)
MCV: 94.7 fL (ref 80.0–100.0)
Platelets: 289 10*3/uL (ref 150–400)
RBC: 3.03 MIL/uL — ABNORMAL LOW (ref 3.87–5.11)
RDW: 15.8 % — ABNORMAL HIGH (ref 11.5–15.5)
WBC: 4.6 10*3/uL (ref 4.0–10.5)
nRBC: 0 % (ref 0.0–0.2)

## 2018-12-27 LAB — BASIC METABOLIC PANEL
Anion gap: 10 (ref 5–15)
BUN: 13 mg/dL (ref 8–23)
CO2: 33 mmol/L — ABNORMAL HIGH (ref 22–32)
Calcium: 7.5 mg/dL — ABNORMAL LOW (ref 8.9–10.3)
Chloride: 97 mmol/L — ABNORMAL LOW (ref 98–111)
Creatinine, Ser: 1.36 mg/dL — ABNORMAL HIGH (ref 0.44–1.00)
GFR calc Af Amer: 43 mL/min — ABNORMAL LOW (ref >60)
GFR calc non Af Amer: 37 mL/min — ABNORMAL LOW (ref >60)
Glucose, Bld: 111 mg/dL — ABNORMAL HIGH (ref 70–99)
Potassium: 3.6 mmol/L (ref 3.5–5.1)
Sodium: 140 mmol/L (ref 135–145)

## 2018-12-27 MED ORDER — METOPROLOL TARTRATE 25 MG PO TABS
25.0000 mg | ORAL_TABLET | Freq: Two times a day (BID) | ORAL | Status: DC
  Administered 2018-12-27 – 2018-12-30 (×6): 25 mg via ORAL
  Filled 2018-12-27 (×6): qty 1

## 2018-12-27 MED ORDER — LETROZOLE 2.5 MG PO TABS
2.5000 mg | ORAL_TABLET | Freq: Every day | ORAL | Status: DC
  Filled 2018-12-27 (×3): qty 1

## 2018-12-27 NOTE — Progress Notes (Addendum)
PROGRESS NOTE  Beverly Tucker [id number] DOB: March 13, 1940 DOA: 12/24/2018 PCP: [name], MD  HPI/Recap of past 24 hours: HPI from Dr [name] is an 78 y.o. female with medical history significant ofmetastatic breast cancer on oral chemotherapy, recurrent malignant left-sided pleural effusion status post Pleurx catheter placement, pneumothorax, chronic diastolic congestive heart failure, CKD stage III, hypertension, paroxysmal atrial fibrillation, paroxysmal supraventricular tachycardia presenting to the hospital with complaints of generalized weakness. Patient was admitted to the hospital 11/14 for HAP, episode of SVT, and acute on chronic diastolic congestive heart failure. She was discharged home on 11/21. Patient states since she left the hospital she has been very weak. Found to be orthostatic as well as have a right pleural effusion.    Today, patient reports still feeling weak, shortness of breath.  Denies any chest pain, nausea/vomiting, fever/chills, dizziness.  Assessment/Plan: Principal Problem:   Pleural effusion Active Problems:   CKD (chronic kidney disease) stage 3, GFR 30-59 ml/min   Metastatic breast cancer (HCC)   Malignant pleural effusion   PSVT (paroxysmal supraventricular tachycardia) (HCC)   Chronic diastolic HF (heart failure) (HCC)   UTI (urinary tract infection)   Generalized weakness  Orthostatic hypotension Improving Continue to hold lasix, TED hose Daily orthostatics  Right-sided pleural effusion, suspect malignant Chest x-ray showing stable left-sided pleural effusion with Pleurx catheter in place and slight increase in right-sided pleural effusion when compared to prior study Patient is currently satting well on 2 L supplemental oxygen (same as home requirement) s/p thoracentesis Plan to repeat another chest x-ray tomorrow to reevaluate  CKD stage IIIb Creatinine noted to be rising slowly, compared to prior to discharge  value Hold home losartan Daily BMP  Mild bradycardia Heart rate in the 50s Decrease Lopressor dose to 25 mg twice daily from 50 mg Continue to monitor on telemetry  ?  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 [name], added to treatment team as an FYI Wound care on board  Anemia of chronic disease History of CKD, metastatic breast cancer Hemoglobin stable at baseline Daily CBC  GOC Palliative consulted       Malnutrition Type:      Malnutrition Characteristics:      Nutrition Interventions:       Estimated body mass index is 28.74 kg/m as calculated from the following:   Height as of this encounter: 5\' 5"  (1.651 m).   Weight as of this encounter: 78.3 kg.     Code Status: Full  Family Communication: None at bedside  Disposition Plan: Likely home   Consultants:  None  Procedures:  None  Antimicrobials:  Rocephin  DVT prophylaxis: Continue apixaban   Objective: Vitals:   12/26/18 2218 12/27/18 0444 12/27/18 0534 12/27/18 0905  BP: (!) 124/49 (!) 124/41  (!) 122/55  Pulse: 66 (!) 59  63  Resp:  20    Temp:  98.2 F (36.8 C)    TempSrc:  Oral    SpO2:  99%  Weight:   78.3 kg   Height:        Intake/Output Summary (Last 24 hours) at 12/27/2018 1454 Last data filed at 12/27/2018 1439 Gross per 24 hour  Intake 587.6 ml  Output 950 ml  Net -362.4 ml   Filed Weights    12/25/18 0356 12/26/18 0504 12/27/18 0534  Weight: 79.1 kg 78 kg 78.3 kg    Exam:  General: NAD, chronically ill-appearing  Cardiovascular: S1, S2 present  Respiratory:  Diminished breath sounds bilaterally  Abdomen: Soft, nontender, nondistended, bowel sounds present  Musculoskeletal: 1+ bilateral pedal edema noted  Skin: Normal  Psychiatry: Normal mood   Data Reviewed: CBC: Recent Labs  Lab 12/21/18 0418 12/24/18 1834 12/27/18 0347  WBC 6.7 7.8 4.6  HGB 9.6* 10.1* 8.6*  HCT 32.4* 33.8* 28.7*  MCV 96.1 95.8 94.7  PLT 455* 431* [id number]   Basic Metabolic Panel: Recent Labs  Lab 12/21/18 0418 12/24/18 1834 12/25/18 0410 12/27/18 0347  NA 137 139  --  140  K 3.7 4.0  --  3.6  CL 93* 97*  --  97*  CO2 34* 31  --  33*  GLUCOSE 114* 103*  --  111*  BUN 12 22  --  13  CREATININE 1.14* 1.27*  --  1.36*  CALCIUM 8.3* 8.6*  --  7.5*  MG  --   --  2.2  --    GFR: Estimated Creatinine Clearance: 34.7 mL/min (A) (by C-G formula based on SCr of 1.36 mg/dL (H)). Liver Function Tests: Recent Labs  Lab 12/21/18 0418  AST 14*  ALT 12  ALKPHOS 75  BILITOT 0.5  PROT 5.6*  ALBUMIN 2.6*   No results for input(s): LIPASE, AMYLASE in the last 168 hours. No results for input(s): AMMONIA in the last 168 hours. Coagulation Profile: No results for input(s): INR, PROTIME in the last 168 hours. Cardiac Enzymes: No results for input(s): CKTOTAL, CKMB, CKMBINDEX, TROPONINI in the last 168 hours. BNP (last 3 results) No results for input(s): PROBNP in the last 8760 hours. HbA1C: No results for input(s): HGBA1C in the last 72 hours. CBG: Recent Labs  Lab 12/24/18 2335  GLUCAP 85   Lipid Profile: No results for input(s): CHOL, HDL, LDLCALC, TRIG, CHOLHDL, LDLDIRECT in the last 72 hours. Thyroid Function Tests: No results for input(s): TSH, T4TOTAL, FREET4, T3FREE, THYROIDAB in the last 72 hours. Anemia Panel: No results for input(s): VITAMINB12, FOLATE, FERRITIN, TIBC,  IRON, RETICCTPCT in the last 72 hours. Urine analysis:    Component Value Date/Time   COLORURINE YELLOW 12/25/2018 0111   APPEARANCEUR HAZY (A) 12/25/2018 0111   LABSPEC 1.015 12/25/2018 0111   PHURINE 5.0 12/25/2018 0111   GLUCOSEU NEGATIVE 12/25/2018 0111   HGBUR NEGATIVE 12/25/2018 0111   BILIRUBINUR NEGATIVE 12/25/2018 0111   KETONESUR 5 (A) 12/25/2018 0111   PROTEINUR NEGATIVE 12/25/2018 0111   NITRITE NEGATIVE 12/25/2018 0111   LEUKOCYTESUR LARGE (A) 12/25/2018 0111   Sepsis Labs: @LABRCNTIP (procalcitonin:4,lacticidven:4)  ) Recent Results (from the past 240 hour(s))  Urine Culture     Status: Abnormal   Collection Time: 12/25/18  1:11 AM   Specimen: Urine, Random  Result Value Ref Range Status   Specimen Description URINE, RANDOM  Final   Special Requests   Final    NONE Performed at [facility], [place] [address] [place], [place] [address]    Culture MULTIPLE SPECIES PRESENT, SUGGEST RECOLLECTION (A)  Final   Report Status 12/26/2018 FINAL  Final  SARS CORONAVIRUS 2 (TAT 6-24 HRS) Nasopharyngeal Nasopharyngeal Swab     Status: None   Collection Time: 12/25/18  2:19 AM   Specimen: Nasopharyngeal Swab  Result Value Ref Range Status   SARS Coronavirus 2 NEGATIVE NEGATIVE Final    Comment: (NOTE) SARS-CoV-2 target nucleic acids are NOT DETECTED. The SARS-CoV-2 RNA is generally detectable in upper and lower respiratory specimens during the acute phase of infection. Negative results do not preclude SARS-CoV-2 infection, do not rule out co-infections with other pathogens, and should not be used as the sole basis for treatment or other patient management decisions. Negative results must be combined with clinical observations, patient history, and epidemiological information. The expected result is Negative. Fact Sheet for Patients: SugarRoll.be Fact Sheet for Healthcare Providers: https://www.woods-mathews.com/ This test  is not yet approved or cleared by the [place] FDA and  has been authorized for detection and/or diagnosis of SARS-CoV-2 by FDA under an Emergency Use Authorization (EUA). This EUA will remain  in effect (meaning this test can be used) for the duration of the COVID-19 declaration under Section 56 4(b)(1) of the Act, 21 U.S.C. section 360bbb-3(b)(1), unless the authorization is terminated or revoked sooner. Performed at [facility], [place] [address] [place], [place] [address]       Studies: No results found.  Scheduled Meds: . amiodarone  200 mg Oral Daily  . apixaban  5 mg Oral BID  . letrozole  2.5 mg Oral Daily  . mouth rinse  15 mL Mouth Rinse BID  . metoprolol tartrate  25 mg Oral BID  . sodium chloride flush  3 mL Intravenous Once    Continuous Infusions: . sodium chloride 1,000 mL (12/25/18 0438)  . cefTRIAXone (ROCEPHIN)  IV 1 g (12/27/18 0307)     LOS: 1 day     Alma Friendly, MD [facility]  If 7PM-7AM, please contact night-coverage [email or web address] 12/27/2018, 2:54 PM

## 2018-12-27 NOTE — Progress Notes (Signed)
Physical Therapy Treatment Patient Details Name: Beverly Tucker MRN: [id number] DOB: May 25, 1940 Today's Date: 12/27/2018    History of Present Illness 78 y.o. 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 Pt underwent thoracentesis on 11/25.    PT Comments    Pt tolerated treatment well despite shoulder pain. Pt demonstrates increased ambulation tolerance although she continues to require 2L [place] supplemental oxygen to maintain saturations at 90 or above. Pt demonstrates improved balance and ambulation, now at a supervision level for transfers and gait. Pt will continue to benefit from acute PT POC to improve activity tolerance and reduce falls risk. Pt will also benefit form gait training with use of 4 wheeled walker a shaving a seat will allow for improved energy conservation in the home and community.   Follow Up Recommendations  Home health PT     Equipment Recommendations  Other (comment)(4 wheeled walker (Rollator))    Recommendations for Other Services       Precautions / Restrictions Precautions Precautions: Fall Restrictions Weight Bearing Restrictions: No    Mobility  Bed Mobility Overal bed mobility: (pt received up in recliner)                Transfers Overall transfer level: Needs assistance Equipment used: Rolling walker (2 wheeled) Transfers: Sit to/from Stand Sit to Stand: Supervision         General transfer comment: x2 from recliner and x1 from bed  Ambulation/Gait Ambulation/Gait assistance: Supervision Gait Distance (Feet): 150 Feet Assistive device: Rolling walker (2  wheeled) Gait Pattern/deviations: Step-through pattern;Wide base of support Gait velocity: reduced Gait velocity interpretation: 1.31 - 2.62 ft/sec, indicative of limited community ambulator General Gait Details: shortened step through gait, pt demonstrating increased on RA, but improved with 2L [place]   Stairs             Wheelchair Mobility    Modified Rankin (Stroke Patients Only)       Balance Overall balance assessment: Needs assistance Sitting-balance support: No upper extremity supported;Feet supported Sitting balance-Leahy Scale: Good Sitting balance - Comments: independent for static sitting balance, supervision for dynamic   Standing balance support: Single extremity supported Standing balance-Leahy Scale: Good Standing balance comment: supervision                            Cognition Arousal/Alertness: Awake/alert Behavior During Therapy: WFL for tasks assessed/performed Overall Cognitive Status: Within Functional Limits for tasks assessed                                        Exercises Other Exercises Other Exercises: Incentive spirometer dep breathing- 15 reps with PT instruction and cues    General Comments General comments (skin integrity, edema, etc.): pt on 3L [place] at rest saturating 96%. Pt abmulating at 93-95% on 2L [place]. PT weaned pt to RA during ambulation and pt desat to 80%, recovering to 93% with 2L [place].      Pertinent Vitals/Pain Pain Assessment: 0-10 Pain Score: 7  Pain Location: R shoulder Pain Descriptors / Indicators: Sore Pain Intervention(s): Limited activity within patient's tolerance;Patient requesting pain meds-RN  notified    Home Living                      Prior Function            PT Goals (current goals can now be found in the care plan section) Acute Rehab PT Goals Patient Stated Goal: to improve breathing Progress towards PT goals: Progressing toward goals    Frequency    Min  3X/week      PT Plan Current plan remains appropriate    Co-evaluation              AM-PAC PT "6 Clicks" Mobility   Outcome Measure  Help needed turning from your back to your side while in a flat bed without using bedrails?: A Little Help needed moving from lying on your back to sitting on the side of a flat bed without using bedrails?: A Little Help needed moving to and from a bed to a chair (including a wheelchair)?: None Help needed standing up from a chair using your arms (e.g., wheelchair or bedside chair)?: None Help needed to walk in hospital room?: None Help needed climbing 3-5 steps with a railing? : A Lot 6 Click Score: 20    End of Session Equipment Utilized During Treatment: Oxygen Activity Tolerance: Patient tolerated treatment well Patient left: in chair;with call bell/phone within reach Nurse Communication: Mobility status PT Visit Diagnosis: Unsteadiness on feet (R26.81);Muscle weakness (generalized) (M62.81)     Time: [id number] PT Time Calculation (min) (ACUTE ONLY): 23 min  Charges:  $Gait Training: 8-22 mins $Therapeutic Activity: 8-22 mins                     Zenaida Niece, PT, DPT Acute Rehabilitation Pager: [phone]    Zenaida Niece 12/27/2018, 9:15 AM

## 2018-12-27 NOTE — Discharge Instructions (Addendum)
-- 1)Very low-salt diet advised 2)Weigh yourself daily, call if you gain more than 3 pounds in 1 day or more than 5 pounds in 1 week as your diuretic medications may need to be adjusted 3)Limit your Fluid  intake to no more than 60 ounces (1.8 Liters) per day 4) follow-up with your oncologist Dr. [name] as previously scheduled 5) you should call the cardiology office within the next 5 days if you do not get a call back to schedule a follow-up appointment with your cardiologist 6) please STOP  metoprolol until you are seen and reevaluated by your cardiologist as outpatient,  7)please continue amiodarone 8) your left chest Pleurx catheter needs to be drained 3 times a week preferably on Tuesdays Thursdays and Saturdays 9) you are taking the blood thinner Eliquis/apixaban so please avoid Avoid ibuprofen/Advil/Aleve/Motrin/Goody Powders/Naproxen/BC powders/Meloxicam/Diclofenac/Indomethacin and other Nonsteroidal anti-inflammatory medications as these will make you more likely to bleed and can cause stomach ulcers, can also cause Kidney problems.    Information on my medicine - ELIQUIS (apixaban)  This medication education was reviewed with me or my healthcare representative as part of my discharge preparation.  Why was Eliquis prescribed for you? Eliquis was prescribed for you to reduce the risk of a blood clot forming that can cause a stroke if you have a medical condition called atrial fibrillation (a type of irregular heartbeat).  What do You need to know about Eliquis ? Take your Eliquis TWICE DAILY - one tablet in the morning and one tablet in the evening with or without food. If you have difficulty swallowing the tablet whole please discuss with your pharmacist how to take the medication safely.  Take Eliquis exactly as prescribed by your doctor and DO NOT stop taking Eliquis without talking to the doctor who prescribed the medication.  Stopping may increase your risk of developing a  stroke.  Refill your prescription before you run out.  After discharge, you should have regular check-up appointments with your healthcare provider that is prescribing your Eliquis.  In the future your dose may need to be changed if your kidney function or weight changes by a significant amount or as you get older.  What do you do if you miss a dose? If you miss a dose, take it as soon as you remember on the same day and resume taking twice daily.  Do not take more than one dose of ELIQUIS at the same time to make up a missed dose.  Important Safety Information A possible side effect of Eliquis is bleeding. You should call your healthcare provider right away if you experience any of the following: ? Bleeding from an injury or your nose that does not stop. ? Unusual colored urine (red or dark brown) or unusual colored stools (red or black). ? Unusual bruising for unknown reasons. ? A serious fall or if you hit your head (even if there is no bleeding).  Some medicines may interact with Eliquis and might increase your risk of bleeding or clotting while on Eliquis. To help avoid this, consult your healthcare provider or pharmacist prior to using any new prescription or non-prescription medications, including herbals, vitamins, non-steroidal anti-inflammatory drugs (NSAIDs) and supplements.  This website has more information on Eliquis (apixaban): http://www.eliquis.com/eliquis/home  --1)Very low-salt diet advised 2)Weigh yourself daily, call if you gain more than 3 pounds in 1 day or more than 5 pounds in 1 week as your diuretic medications may need to be adjusted 3)Limit your Fluid  intake to  no more than 60 ounces (1.8 Liters) per day 4) follow-up with your oncologist Dr. [name] as previously scheduled 5) you should call the cardiology office within the next 5 days if you do not get a call back to schedule a follow-up appointment with your cardiologist 6) please STOP  metoprolol until you are  seen and reevaluated by your cardiologist as outpatient,  7)please continue amiodarone 8) your left chest Pleurx catheter needs to be drained 3 times a week preferably on Tuesdays Thursdays and Saturdays 9) you are taking the blood thinner Eliquis/apixaban so please avoid Avoid ibuprofen/Advil/Aleve/Motrin/Goody Powders/Naproxen/BC powders/Meloxicam/Diclofenac/Indomethacin and other Nonsteroidal anti-inflammatory medications as these will make you more likely to bleed and can cause stomach ulcers, can also cause Kidney problems.

## 2018-12-28 ENCOUNTER — Encounter (HOSPITAL_COMMUNITY): Payer: Self-pay | Admitting: Primary Care

## 2018-12-28 ENCOUNTER — Inpatient Hospital Stay (HOSPITAL_COMMUNITY): Payer: Medicare HMO

## 2018-12-28 DIAGNOSIS — Z7189 Other specified counseling: Secondary | ICD-10-CM

## 2018-12-28 DIAGNOSIS — Z515 Encounter for palliative care: Secondary | ICD-10-CM

## 2018-12-28 LAB — CBC WITH DIFFERENTIAL/PLATELET
Abs Immature Granulocytes: 0.02 10*3/uL (ref 0.00–0.07)
Basophils Absolute: 0.1 10*3/uL (ref 0.0–0.1)
Basophils Relative: 1 %
Eosinophils Absolute: 0.1 10*3/uL (ref 0.0–0.5)
Eosinophils Relative: 2 %
HCT: 30.9 % — ABNORMAL LOW (ref 36.0–46.0)
Hemoglobin: 9.3 g/dL — ABNORMAL LOW (ref 12.0–15.0)
Immature Granulocytes: 0 %
Lymphocytes Relative: 24 %
Lymphs Abs: 1.1 10*3/uL (ref 0.7–4.0)
MCH: 28.4 pg (ref 26.0–34.0)
MCHC: 30.1 g/dL (ref 30.0–36.0)
MCV: 94.2 fL (ref 80.0–100.0)
Monocytes Absolute: 0.5 10*3/uL (ref 0.1–1.0)
Monocytes Relative: 12 %
Neutro Abs: 2.8 10*3/uL (ref 1.7–7.7)
Neutrophils Relative %: 61 %
Platelets: 306 10*3/uL (ref 150–400)
RBC: 3.28 MIL/uL — ABNORMAL LOW (ref 3.87–5.11)
RDW: 15.5 % (ref 11.5–15.5)
WBC: 4.6 10*3/uL (ref 4.0–10.5)
nRBC: 0 % (ref 0.0–0.2)

## 2018-12-28 LAB — BASIC METABOLIC PANEL
Anion gap: 8 (ref 5–15)
BUN: 10 mg/dL (ref 8–23)
CO2: 34 mmol/L — ABNORMAL HIGH (ref 22–32)
Calcium: 7.7 mg/dL — ABNORMAL LOW (ref 8.9–10.3)
Chloride: 99 mmol/L (ref 98–111)
Creatinine, Ser: 0.91 mg/dL (ref 0.44–1.00)
GFR calc Af Amer: >60 mL/min (ref >60)
GFR calc non Af Amer: >60 mL/min (ref >60)
Glucose, Bld: 105 mg/dL — ABNORMAL HIGH (ref 70–99)
Potassium: 3.8 mmol/L (ref 3.5–5.1)
Sodium: 141 mmol/L (ref 135–145)

## 2018-12-28 NOTE — Progress Notes (Signed)
Patient had 428ml foamy clear serous fluid drained from her pleurx catheter.  Patient tolerated procedure well and expressed relief after procedure.  Marcille Blanco, RN

## 2018-12-28 NOTE — Consult Note (Signed)
Consultation Note Date: 12/28/2018   Patient Name: Beverly Tucker  DOB: July 08, 1940  MRN: [id number]  Age / Sex: 78 y.o., female  PCP: [name], MD Referring Physician: Alma Friendly, MD  Reason for Consultation: Establishing goals of care  HPI/Patient Profile: 78 y.o. female  with past medical history of metastatic breast cancer on oral chemotherapy, recurrent malignant L pleural effusion sp Pleurx, pneumothorax, c diastolic HF, CKD stage III, hypertension, p A fib., p SVT admitted on 12/24/2018 with pleural effusion, orthostatic hypotension .   Clinical Assessment and Goals of Care: Beverly Tucker is sitting up in the [place] chair in her room with her lunch tray in front of her.  She greets me making and mostly keeping eye contact.  She is alert and oriented, able to make her needs known.  There is no family at bedside at this time.  We talked about her acute and chronic health problems.  Beverly Tucker shares that her husband passed away about 15 years ago.  She had been healthy until that time, not taking medicines.  She shares that she has been hospitalized several times now, she thinks because of "medication changes".    Beverly Tucker tells me that she has not been sleeping well at home.  She states that she has medications for anxiety, I encouraged her to use these, in particular in light of her cancer diagnosis.  Beverly Tucker states that this summer she was doing her own yard work.  She shares that she is able to do her own bathing and dressing, but her son [name] and his daughter Iven Finn help her with getting groceries and cooking food.  She shares that the family eat meals together at least 4 nights per week.  She shares that [name] lives right across the road from her, and is a big help.    We talked about PT evaluation and recommendations for home with home health.  She tells me that she is active with a  home health services already, and would continue to use the same service.  We talked about the benefit of in-home hospice care for "treat the treatable care".  Beverly Tucker tells me that she has had some experience with hospice care.  I encouraged her to talk about these choices with her family.  We talked about healthcare power of attorney, see below. We talked about CODE STATUS, see below.   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    SUMMARY OF RECOMMENDATIONS   Continue to treat the treatable. Continue oral chemotherapy agents as offered Considering CODE STATUS  Code Status/Advance Care Planning:  Full code -we talked about the concept of "treat the treatable, but allowing natural passing".  Beverly Tucker states that she is never considered this, I encouraged her to discuss what she does want, what she does not want with her children.  Symptom Management:   Per hospitalist, no additional needs at  this time.   Palliative Prophylaxis:   Frequent Pain Assessment and Palliative Wound Care  Additional Recommendations (Limitations, Scope, Preferences):  Full Scope Treatment  Psycho-social/Spiritual:   Desire for further Chaplaincy support:no  Additional Recommendations: Caregiving  Support/Resources and Education on Hospice  Prognosis:   Unable to determine, based on outcomes.  6 months or less would not be surprising based on declining functional status, 5 hospitalizations and 1 ED visit in 6 months.  Discharge Planning: Agreeable to return home with home health.      Primary Diagnoses: Present on Admission: . Pleural effusion . UTI (urinary tract infection) . PSVT (paroxysmal supraventricular tachycardia) ([place]) . Metastatic breast cancer ([place]) . Malignant pleural effusion . CKD (chronic kidney disease) stage 3, GFR 30-59 ml/min . Chronic diastolic HF (heart failure) ([place])   I have reviewed the  medical record, interviewed the patient and family, and examined the patient. The following aspects are pertinent.  Past Medical History:  Diagnosis Date  . Anemia   . Cancer (HCC)    Breast   . Chronic diastolic CHF (congestive heart failure) ([place])   . CKD (chronic kidney disease), stage III   . GERD (gastroesophageal reflux disease)   . Hypertension   . Malignant pleural effusion   . Metastatic breast cancer ([place])   . PAF (paroxysmal atrial fibrillation) ([place])   . PSVT (paroxysmal supraventricular tachycardia) (HCC)    Social History   Socioeconomic History  . Marital status: Widowed    Spouse name: Not on file  . Number of children: Not on file  . Years of education: Not on file  . Highest education level: Not on file  Occupational History  . Not on file  Social Needs  . Financial resource strain: Not on file  . Food insecurity    Worry: Not on file    Inability: Not on file  . Transportation needs    Medical: Not on file    Non-medical: Not on file  Tobacco Use  . Smoking status: Never Smoker  . Smokeless tobacco: Never Used  Substance and Sexual Activity  . Alcohol use: Never    Frequency: Never  . Drug use: Never  . Sexual activity: Not on file  Lifestyle  . Physical activity    Days per week: Not on file    Minutes per session: Not on file  . Stress: Not on file  Relationships  . Social Herbalist on phone: Not on file    Gets together: Not on file    Attends religious service: Not on file    Active member of club or organization: Not on file    Attends meetings of clubs or organizations: Not on file    Relationship status: Not on file  Other Topics Concern  . Not on file  Social History Narrative  . Not on file   Family History  Problem Relation Age of Onset  . Diabetes Mellitus II Other        son   Scheduled Meds: . amiodarone  200 mg Oral Daily  . apixaban  5 mg Oral BID  . letrozole  2.5 mg Oral Daily  . mouth rinse  15 mL  Mouth Rinse BID  . metoprolol tartrate  25 mg Oral BID  . sodium chloride flush  3 mL Intravenous Once   Continuous Infusions: . sodium chloride 1,000 mL (12/25/18 0438)  . cefTRIAXone (ROCEPHIN)  IV 1 g (12/28/18 0448)   PRN Meds:.sodium chloride,  acetaminophen **OR** acetaminophen, ALPRAZolam, lidocaine, ondansetron (ZOFRAN) IV Medications Prior to Admission:  Prior to Admission medications   Medication Sig Start Date End Date Taking? Authorizing Provider  acetaminophen (TYLENOL) 500 MG tablet Take 1,000 mg by mouth every 6 (six) hours as needed.   Yes [provider]  ALPRAZolam (XANAX) 0.5 MG tablet Take 0.25-0.5 mg by mouth 2 (two) times daily as needed for anxiety. 05/31/17  Yes [provider]  amiodarone (PACERONE) 200 MG tablet Take 1 tablet (200 mg total) by mouth daily. 12/20/18 01/19/19 Yes Donne Hazel, MD  apixaban (ELIQUIS) 5 MG TABS tablet Take 1 tablet (5 mg total) by mouth 2 (two) times daily. 10/23/18  Yes Jeanmarie Hubert, MD  ergocalciferol (VITAMIN D2) 1.25 MG (50000 UT) capsule Take 1 capsule (50,000 Units total) by mouth once a week. 11/04/18  Yes Brunetta Genera, MD  furosemide (LASIX) 40 MG tablet Take 1 tablet (40 mg total) by mouth daily. 11/23/18  Yes Dessa Phi, DO  letrozole [facility]) 2.5 MG tablet Take 1 tablet (2.5 mg total) by mouth daily. 12/20/18 01/19/19 Yes Donne Hazel, MD  loperamide (IMODIUM) 2 MG capsule Take 1 capsule (2 mg total) by mouth as needed for diarrhea or loose stools. 11/24/18  Yes Dessa Phi, DO  losartan (COZAAR) 50 MG tablet Take 1 tablet (50 mg total) by mouth daily. 12/21/18 01/20/19 Yes Donne Hazel, MD  metoprolol tartrate (LOPRESSOR) 50 MG tablet Take 1 tablet (50 mg total) by mouth 2 (two) times daily. 12/21/18 01/20/19 Yes Donne Hazel, MD  ondansetron (ZOFRAN ODT) 4 MG disintegrating tablet Take 1 tablet (4 mg total) by mouth every 8 (eight) hours as needed for nausea or vomiting. 12/20/18   Yes Donne Hazel, MD   No Known Allergies Review of Systems  Unable to perform ROS: Other    Physical Exam Vitals signs and nursing note reviewed.  Constitutional:      General: She is not in acute distress.    Comments: Makes and mostly keeps eye contact, somewhat frail  HENT:     Head: Normocephalic and atraumatic.  Cardiovascular:     Rate and Rhythm: Normal rate.  Pulmonary:     Effort: Pulmonary effort is normal. No respiratory distress.  Abdominal:     General: Abdomen is flat.  Musculoskeletal:        General: No swelling.  Skin:    General: Skin is warm and dry.     Comments: Wound at R breast drsg CDI.   Neurological:     Mental Status: She is alert and oriented to person, place, and time.  Psychiatric:     Comments: Calm and cooperative, pleasant     Vital Signs: BP (!) 153/79 (BP Location: Left Arm)   Pulse 73   Temp 98.1 F (36.7 C) (Oral)   Resp 20   Ht 5\' 5"  (1.651 m)   Wt 79.2 kg   SpO2 94%   BMI 29.06 kg/m  Pain Scale: 0-10   Pain Score: 0-No pain   SpO2: SpO2: 94 % O2 Device:SpO2: 94 % O2 Flow Rate: .O2 Flow Rate (L/min): 2 L/min  IO: Intake/output summary:   Intake/Output Summary (Last 24 hours) at 12/28/2018 1112 Last data filed at 12/28/2018 0900 Gross per 24 hour  Intake 240 ml  Output 950 ml  Net -710 ml    LBM: Last BM Date: 12/25/18 Baseline Weight: Weight: 80.1 kg Most recent weight: Weight: 79.2 kg  Palliative Assessment/Data:   Flowsheet Rows     Most Recent Value  Intake Tab  Referral Department  Hospitalist  Unit at Time of Referral  Cardiac/Telemetry Unit  Palliative Care Primary Diagnosis  Cancer  Date Notified  12/27/18  Palliative Care Type  New Palliative care  Reason for referral  Clarify Goals of Care  Date of Admission  12/24/18  Date first seen by Palliative Care  12/28/18  # of days Palliative referral response time  1 Day(s)  # of days IP prior to Palliative referral  3  Clinical Assessment   Palliative Performance Scale Score  40%  Pain Max last 24 hours  Not able to report  Pain Min Last 24 hours  Not able to report  Dyspnea Max Last 24 Hours  Not able to report  Dyspnea Min Last 24 hours  Not able to report  Psychosocial & Spiritual Assessment  Palliative Care Outcomes      Time In: 1200 Time Out: 1250 Time Total: 50 minutes  Greater than 50%  of this time was spent counseling and coordinating care related to the above assessment and plan.  Signed by: Drue Novel, NP   Please contact Palliative Medicine Team phone at [phone] for questions and concerns.  For individual provider: See Shea Evans

## 2018-12-28 NOTE — Progress Notes (Signed)
PROGRESS NOTE  Beverly Tucker [id number] DOB: 05/27/1940 DOA: 12/24/2018 PCP: [name], MD  HPI/Recap of past 24 hours: HPI from Dr [name] is an 78 y.o. female with medical history significant ofmetastatic breast cancer on oral chemotherapy, recurrent malignant left-sided pleural effusion status post Pleurx catheter placement, pneumothorax, chronic diastolic congestive heart failure, CKD stage III, hypertension, paroxysmal atrial fibrillation, paroxysmal supraventricular tachycardia presenting to the hospital with complaints of generalized weakness. Patient was admitted to the hospital 11/14 for HAP, episode of SVT, and acute on chronic diastolic congestive heart failure. She was discharged home on 11/21. Patient states since she left the hospital she has been very weak. Found to be orthostatic as well as have a right pleural effusion.    Today, patient still reports being short of breath, with generalized weakness.  Denies any chest pain, nausea/vomiting, fever/chills.  Request for her Pleurx catheter to be drained, states she does that twice a week and none has been done since admission.  Assessment/Plan: Principal Problem:   Pleural effusion Active Problems:   CKD (chronic kidney disease) stage 3, GFR 30-59 ml/min   Metastatic breast cancer (HCC)   Malignant pleural effusion   PSVT (paroxysmal supraventricular tachycardia) (HCC)   Chronic diastolic HF (heart failure) (HCC)   UTI (urinary tract infection)   Generalized weakness   Goals of care, counseling/discussion   Palliative care by specialist   Encounter for hospice care discussion  Orthostatic hypotension Improving Continue to hold lasix, TED hose Daily orthostatics  Right-sided pleural effusion, suspect malignant Chest x-ray showing stable left-sided pleural effusion with Pleurx catheter in place and slight increase in right-sided pleural effusion when compared to prior study Patient is  currently satting well on 2 L supplemental oxygen (same as home requirement) s/p thoracentesis Repeat chest x-ray showed bilateral pleural effusions with potential superimposed infiltrate in the base Patient had her left Pleurx catheter drained on 12/28/2018, about 480 mils of serous fluid drained  CKD stage IIIb Creatinine at baseline Hold home losartan Daily BMP  Mild bradycardia Improving Decrease Lopressor dose to 25 mg twice daily from 50 mg Continue to monitor on telemetry  ? UTI UA positive for infection UC showed multiple species, suggest recollection Continue IV Rocephin for total of 5 days  Paroxysmal atrial fibrillation/ PSVT Heart rate as above Continue Lopressor as above, amiodarone Continue apixaban  Generalized weakness Could be related to orthostatics vs deconditioning (wsa just d/c'd from hospital) PT/OT  Chronic diastolic congestive heart failure BNP normal Echo done 12/15/18 showed EF of 60 to [id number], grade 1 diastolic dysfunction  Hold home Lasix, losartan for now  Metastatic breast cancer/fungating mass on R breast Follow-up with Dr. [name], added to treatment team as an FYI Wound care on board  Anemia of chronic disease History of CKD, metastatic breast cancer Hemoglobin stable at baseline Daily CBC  GOC Palliative consulted       Malnutrition Type:      Malnutrition Characteristics:      Nutrition Interventions:       Estimated body mass index is 29.06 kg/m as calculated from the following:   Height as of this encounter: 5\' 5"  (1.651 m).   Weight as of this encounter: 79.2 kg.     Code Status: Full  Family Communication: None at bedside  Disposition Plan: Likely home   Consultants:  None  Procedures:  None  Antimicrobials:  Rocephin  DVT prophylaxis: Continue apixaban   Objective: Vitals:   12/28/18 0500  12/28/18 0513 12/28/18 0822 12/28/18 1307  BP:  (!) 159/70 (!) 153/79 105/60  Pulse:  65 73 (!)  57  Resp:  20 20 18   Temp:  98.3 F (36.8 C) 98.1 F (36.7 C) 98.6 F (37 C)  TempSrc:  Oral Oral Oral  SpO2:  99% 94% 100%  Weight: 79.2 kg     Height:        Intake/Output Summary (Last 24 hours) at 12/28/2018 1737 Last data filed at 12/28/2018 1431 Gross per 24 hour  Intake 120 ml  Output 1430 ml  Net -1310 ml   Filed Weights   12/26/18 0504 12/27/18 0534 12/28/18 0500  Weight: 78 kg 78.3 kg 79.2 kg    Exam:  General: NAD, chronically ill-appearing  Cardiovascular: S1, S2 present  Respiratory:  Diminished breath sounds bilaterally  Abdomen: Soft, nontender, nondistended, bowel sounds present  Musculoskeletal: 1+ bilateral pedal edema noted  Skin: Normal  Psychiatry: Normal mood   Data Reviewed: CBC: Recent Labs  Lab 12/24/18 1834 12/27/18 0347 12/28/18 0431  WBC 7.8 4.6 4.6  NEUTROABS  --   --  2.8  HGB 10.1* 8.6* 9.3*  HCT 33.8* 28.7* 30.9*  MCV 95.8 94.7 94.2  PLT 431* 289 [id number]   Basic Metabolic Panel: Recent Labs  Lab 12/24/18 1834 12/25/18 0410 12/27/18 0347 12/28/18 0431  NA 139  --  140 141  K 4.0  --  3.6 3.8  CL 97*  --  97* 99  CO2 31  --  33* 34*  GLUCOSE 103*  --  111* 105*  BUN 22  --  13 10  CREATININE 1.27*  --  1.36* 0.91  CALCIUM 8.6*  --  7.5* 7.7*  MG  --  2.2  --   --    GFR: Estimated Creatinine Clearance: 52.2 mL/min (by C-G formula based on SCr of 0.91 mg/dL). Liver Function Tests: No results for input(s): AST, ALT, ALKPHOS, BILITOT, PROT, ALBUMIN in the last 168 hours. No results for input(s): LIPASE, AMYLASE in the last 168 hours. No results for input(s): AMMONIA in the last 168 hours. Coagulation Profile: No results for input(s): INR, PROTIME in the last 168 hours. Cardiac Enzymes: No results for input(s): CKTOTAL, CKMB, CKMBINDEX, TROPONINI in the last 168 hours. BNP (last 3 results) No results for input(s): PROBNP in the last 8760 hours. HbA1C: No results for input(s): HGBA1C in the last 72 hours. CBG:  Recent Labs  Lab 12/24/18 2335  GLUCAP 85   Lipid Profile: No results for input(s): CHOL, HDL, LDLCALC, TRIG, CHOLHDL, LDLDIRECT in the last 72 hours. Thyroid Function Tests: No results for input(s): TSH, T4TOTAL, FREET4, T3FREE, THYROIDAB in the last 72 hours. Anemia Panel: No results for input(s): VITAMINB12, FOLATE, FERRITIN, TIBC, IRON, RETICCTPCT in the last 72 hours. Urine analysis:    Component Value Date/Time   COLORURINE YELLOW 12/25/2018 0111   APPEARANCEUR HAZY (A) 12/25/2018 0111   LABSPEC 1.015 12/25/2018 0111   PHURINE 5.0 12/25/2018 0111   GLUCOSEU NEGATIVE 12/25/2018 0111   HGBUR NEGATIVE 12/25/2018 0111   BILIRUBINUR NEGATIVE 12/25/2018 0111   KETONESUR 5 (A) 12/25/2018 0111   PROTEINUR NEGATIVE 12/25/2018 0111   NITRITE NEGATIVE 12/25/2018 0111   LEUKOCYTESUR LARGE (A) 12/25/2018 0111   Sepsis Labs: @LABRCNTIP (procalcitonin:4,lacticidven:4)  ) Recent Results (from the past 240 hour(s))  Urine Culture     Status: Abnormal   Collection Time: 12/25/18  1:11 AM   Specimen: Urine, Random  Result Value Ref Range Status  Specimen Description URINE, RANDOM  Final   Special Requests   Final    NONE Performed at [facility], [place] [address], [place], [place] [address]    Culture MULTIPLE SPECIES PRESENT, SUGGEST RECOLLECTION (A)  Final   Report Status 12/26/2018 FINAL  Final  SARS CORONAVIRUS 2 (TAT 6-24 HRS) Nasopharyngeal Nasopharyngeal Swab     Status: None   Collection Time: 12/25/18  2:19 AM   Specimen: Nasopharyngeal Swab  Result Value Ref Range Status   SARS Coronavirus 2 NEGATIVE NEGATIVE Final    Comment: (NOTE) SARS-CoV-2 target nucleic acids are NOT DETECTED. The SARS-CoV-2 RNA is generally detectable in upper and lower respiratory specimens during the acute phase of infection. Negative results do not preclude SARS-CoV-2 infection, do not rule out co-infections with other pathogens, and should not be used as the sole basis for treatment  or other patient management decisions. Negative results must be combined with clinical observations, patient history, and epidemiological information. The expected result is Negative. Fact Sheet for Patients: SugarRoll.be Fact Sheet for Healthcare Providers: https://www.woods-mathews.com/ This test is not yet approved or cleared by the [place] FDA and  has been authorized for detection and/or diagnosis of SARS-CoV-2 by FDA under an Emergency Use Authorization (EUA). This EUA will remain  in effect (meaning this test can be used) for the duration of the COVID-19 declaration under Section 56 4(b)(1) of the Act, 21 U.S.C. section 360bbb-3(b)(1), unless the authorization is terminated or revoked sooner. Performed at [facility], [place] [address], [place], [place] [address]       Studies: Dg Chest Port 1 View  Result Date: 12/28/2018 CLINICAL DATA:  Pleural effusions EXAM: PORTABLE CHEST 1 VIEW COMPARISON:  December 26, 2018 FINDINGS: PleurX catheter remains on the left, unchanged in position. There remains loculated pleural effusion on the left with a somewhat smaller pleural effusion on the right. There is atelectatic change in each lung base region. There may be a degree of superimposed infiltrate in the left base and medial right base regions. No new opacity evident. Heart is mildly enlarged, stable, with pulmonary vascularity normal. There is aortic atherosclerosis. No pneumothorax. No bone lesions. IMPRESSION: Stable PleurX catheter positioning. Bilateral pleural effusions remain stable with atelectasis and potential superimposed infiltrate in the bases. No new opacity. Stable cardiac prominence. Aortic Atherosclerosis (ICD10-I70.0). Electronically Signed   By: Lowella Grip III M.D.   On: 12/28/2018 08:01    Scheduled Meds: . amiodarone  200 mg Oral Daily  . apixaban  5 mg Oral BID  . letrozole  2.5 mg Oral Daily  . mouth rinse   15 mL Mouth Rinse BID  . metoprolol tartrate  25 mg Oral BID  . sodium chloride flush  3 mL Intravenous Once    Continuous Infusions: . sodium chloride 1,000 mL (12/25/18 0438)  . cefTRIAXone (ROCEPHIN)  IV 1 g (12/28/18 0448)     LOS: 2 days     Alma Friendly, MD [facility]  If 7PM-7AM, please contact night-coverage [email or web address] 12/28/2018, 5:37 PM

## 2018-12-29 DIAGNOSIS — N1831 Chronic kidney disease, stage 3a: Secondary | ICD-10-CM

## 2018-12-29 MED ORDER — MAGIC MOUTHWASH
10.0000 mL | Freq: Three times a day (TID) | ORAL | Status: DC | PRN
  Administered 2018-12-29 – 2018-12-30 (×2): 10 mL via ORAL
  Filled 2018-12-29 (×4): qty 10

## 2018-12-29 MED ORDER — FUROSEMIDE 40 MG PO TABS
40.0000 mg | ORAL_TABLET | Freq: Every day | ORAL | Status: DC
  Administered 2018-12-29 – 2018-12-31 (×3): 40 mg via ORAL
  Filled 2018-12-29 (×3): qty 1

## 2018-12-29 MED ORDER — DRONABINOL 2.5 MG PO CAPS
2.5000 mg | ORAL_CAPSULE | Freq: Two times a day (BID) | ORAL | Status: DC
  Administered 2018-12-30 – 2018-12-31 (×4): 2.5 mg via ORAL
  Filled 2018-12-29 (×4): qty 1

## 2018-12-29 MED ORDER — LETROZOLE 2.5 MG PO TABS
2.5000 mg | ORAL_TABLET | Freq: Every day | ORAL | Status: DC
  Administered 2018-12-29 – 2018-12-30 (×2): 2.5 mg via ORAL
  Filled 2018-12-29 (×3): qty 1

## 2018-12-29 NOTE — Progress Notes (Addendum)
PROGRESS NOTE  Beverly Tucker [id number] DOB: Jun 28, 1940 DOA: 12/24/2018 PCP: [name], MD  HPI/Recap of past 24 hours: HPI from Dr Beverly Tucker is an 78 y.o. female with medical history significant ofmetastatic breast cancer on oral chemotherapy, recurrent malignant left-sided pleural effusion status post Pleurx catheter placement, pneumothorax, chronic diastolic congestive heart failure, CKD stage III, hypertension, paroxysmal atrial fibrillation, paroxysmal supraventricular tachycardia presenting to the hospital with complaints of generalized weakness. Patient was admitted to the hospital 11/14 for HAP, episode of SVT, and acute on chronic diastolic congestive heart failure. She was discharged home on 11/21. Patient states since she left the hospital she has been very weak. Found to be orthostatic as well as have a right pleural effusion.    Today, patient's main complaint is that she has no appetite and is unable to eat.  She continues to have poor p.o. intake.  She has a bad taste in her mouth.  Assessment/Plan: Principal Problem:   Pleural effusion Active Problems:   CKD (chronic kidney disease) stage 3, GFR 30-59 ml/min   Metastatic breast cancer (HCC)   Malignant pleural effusion   PSVT (paroxysmal supraventricular tachycardia) (HCC)   Chronic diastolic HF (heart failure) (HCC)   UTI (urinary tract infection)   Generalized weakness   Goals of care, counseling/discussion   Palliative care by specialist   Encounter for hospice care discussion  Orthostatic hypotension Improving Continue TED hose Daily orthostatics  Right-sided pleural effusion, suspect malignant Chest x-ray showing stable left-sided pleural effusion with Pleurx catheter in place and slight increase in right-sided pleural effusion when compared to prior study s/p thoracentesis with removal of 300 cc of fluid from right-sided effusion.  Unfortunately fluid was not sent for further  analysis Repeat chest x-ray showed bilateral pleural effusions with potential superimposed infiltrate in the base Patient had her left Pleurx catheter drained on 12/28/2018, about 480 mils of serous fluid drained  Acute on chronic respiratory failure with hypoxia At baseline, patient requires 2 to 3 L of oxygen.  Currently she is requiring 4 to 5 L.  She has undergone thoracentesis as well as drainage of Pleurx catheter which should hopefully help her respiratory status.  We will try and wean down oxygen as tolerated.  CKD stage IIIb Creatinine at baseline Hold home losartan Daily BMP  Mild bradycardia Improving Decrease Lopressor dose to 25 mg twice daily from 50 mg Continue to monitor on telemetry  ? UTI UA positive for infection UC showed multiple species, suggest recollection Continue IV Rocephin for total of 5 days  Paroxysmal atrial fibrillation/ PSVT Heart rate as above Continue Lopressor as above, amiodarone Continue apixaban  Generalized weakness Could be related to orthostatics vs deconditioning (was just d/c'd from hospital) PT/OT  Chronic diastolic congestive heart failure BNP normal Echo done 12/15/18 showed EF of 60 to 123456, grade 1 diastolic dysfunction  Resume home dose of Lasix  Metastatic breast cancer/fungating mass on R breast Follow-up with Dr. [name], added to treatment team as an FYI Wound care on board  Anemia of chronic disease History of CKD, metastatic breast cancer Hemoglobin stable at baseline Daily CBC  [place] Palliative consulted  Anorexia Likely related to chemo/underlying malignancy.  Started on Marinol.      Malnutrition Type:      Malnutrition Characteristics:      Nutrition Interventions:       Estimated body mass index is 28.57 kg/m as calculated from the following:   Height as of this  encounter: 5\' 5"  (1.651 m).   Weight as of this encounter: 77.9 kg.     Code Status: Full  Family Communication: None at  bedside  Disposition Plan: Likely home in a.m. if she is able to ambulate on her home oxygen requirement of 2 to 3 L does not become dyspneic/hypoxic   Consultants:  Palliative care  Procedures:  11/25: Right-sided thoracentesis with removal of 300 mL of fluid  Antimicrobials:  Rocephin  DVT prophylaxis: Continue apixaban   Objective: Vitals:   12/29/18 1230 12/29/18 1556 12/29/18 1559 12/29/18 2022  BP: (!) 127/56 (!) 150/44 139/63 (!) 136/55  Pulse: 61 63 63 80  Resp: (!) 22 16  19   Temp: 98.2 F (36.8 C) 98.4 F (36.9 C)  99.2 F (37.3 C)  TempSrc: Oral Oral  Oral  SpO2: 100% 100%  97%  Weight:      Height:        Intake/Output Summary (Last 24 hours) at 12/29/2018 2033 Last data filed at 12/29/2018 1600 Gross per 24 hour  Intake 1339.83 ml  Output 1402 ml  Net -62.17 ml   Filed Weights   12/27/18 0534 12/28/18 0500 12/29/18 0542  Weight: 78.3 kg 79.2 kg 77.9 kg    Exam: General exam: Alert, awake, oriented x 3 Respiratory system: Diminished breath sounds at bases bilaterally. Respiratory effort normal. Cardiovascular system:RRR. No murmurs, rubs, gallops. Gastrointestinal system: Abdomen is nondistended, soft and nontender. No organomegaly or masses felt. Normal bowel sounds heard. Central nervous system: Alert and oriented. No focal neurological deficits. Extremities: 1+ edema lower extremities bilaterally Skin: No rashes, lesions or ulcers  Psychiatry: Judgement and insight appear normal. Mood & affect appropriate.   Data Reviewed: CBC: Recent Labs  Lab 12/24/18 1834 12/27/18 0347 12/28/18 0431  WBC 7.8 4.6 4.6  NEUTROABS  --   --  2.8  HGB 10.1* 8.6* 9.3*  HCT 33.8* 28.7* 30.9*  MCV 95.8 94.7 94.2  PLT 431* 289 [id number]   Basic Metabolic Panel: Recent Labs  Lab 12/24/18 1834 12/25/18 0410 12/27/18 0347 12/28/18 0431  NA 139  --  140 141  K 4.0  --  3.6 3.8  CL 97*  --  97* 99  CO2 31  --  33* 34*  GLUCOSE 103*  --  111* 105*  BUN  22  --  13 10  CREATININE 1.27*  --  1.36* 0.91  CALCIUM 8.6*  --  7.5* 7.7*  MG  --  2.2  --   --    GFR: Estimated Creatinine Clearance: 51.8 mL/min (by C-G formula based on SCr of 0.91 mg/dL). Liver Function Tests: No results for input(s): AST, ALT, ALKPHOS, BILITOT, PROT, ALBUMIN in the last 168 hours. No results for input(s): LIPASE, AMYLASE in the last 168 hours. No results for input(s): AMMONIA in the last 168 hours. Coagulation Profile: No results for input(s): INR, PROTIME in the last 168 hours. Cardiac Enzymes: No results for input(s): CKTOTAL, CKMB, CKMBINDEX, TROPONINI in the last 168 hours. BNP (last 3 results) No results for input(s): PROBNP in the last 8760 hours. HbA1C: No results for input(s): HGBA1C in the last 72 hours. CBG: Recent Labs  Lab 12/24/18 2335  GLUCAP 85   Lipid Profile: No results for input(s): CHOL, HDL, LDLCALC, TRIG, CHOLHDL, LDLDIRECT in the last 72 hours. Thyroid Function Tests: No results for input(s): TSH, T4TOTAL, FREET4, T3FREE, THYROIDAB in the last 72 hours. Anemia Panel: No results for input(s): VITAMINB12, FOLATE, FERRITIN, TIBC, IRON, RETICCTPCT in  the last 72 hours. Urine analysis:    Component Value Date/Time   COLORURINE YELLOW 12/25/2018 0111   APPEARANCEUR HAZY (A) 12/25/2018 0111   LABSPEC 1.015 12/25/2018 0111   PHURINE 5.0 12/25/2018 0111   GLUCOSEU NEGATIVE 12/25/2018 0111   HGBUR NEGATIVE 12/25/2018 0111   BILIRUBINUR NEGATIVE 12/25/2018 0111   KETONESUR 5 (A) 12/25/2018 0111   PROTEINUR NEGATIVE 12/25/2018 0111   NITRITE NEGATIVE 12/25/2018 0111   LEUKOCYTESUR LARGE (A) 12/25/2018 0111   Sepsis Labs: @LABRCNTIP (procalcitonin:4,lacticidven:4)  ) Recent Results (from the past 240 hour(s))  Urine Culture     Status: Abnormal   Collection Time: 12/25/18  1:11 AM   Specimen: Urine, Random  Result Value Ref Range Status   Specimen Description URINE, RANDOM  Final   Special Requests   Final    NONE Performed  at [facility], [address] [place], [place] [address]    Culture MULTIPLE SPECIES PRESENT, SUGGEST RECOLLECTION (A)  Final   Report Status 12/26/2018 FINAL  Final  SARS CORONAVIRUS 2 (TAT 6-24 HRS) Nasopharyngeal Nasopharyngeal Swab     Status: None   Collection Time: 12/25/18  2:19 AM   Specimen: Nasopharyngeal Swab  Result Value Ref Range Status   SARS Coronavirus 2 NEGATIVE NEGATIVE Final    Comment: (NOTE) SARS-CoV-2 target nucleic acids are NOT DETECTED. The SARS-CoV-2 RNA is generally detectable in upper and lower respiratory specimens during the acute phase of infection. Negative results do not preclude SARS-CoV-2 infection, do not rule out co-infections with other pathogens, and should not be used as the sole basis for treatment or other patient management decisions. Negative results must be combined with clinical observations, patient history, and epidemiological information. The expected result is Negative. Fact Sheet for Patients: SugarRoll.be Fact Sheet for Healthcare Providers: https://www.woods-mathews.com/ This test is not yet approved or cleared by the [place] FDA and  has been authorized for detection and/or diagnosis of SARS-CoV-2 by FDA under an Emergency Use Authorization (EUA). This EUA will remain  in effect (meaning this test can be used) for the duration of the COVID-19 declaration under Section 56 4(b)(1) of the Act, 21 U.S.C. section 360bbb-3(b)(1), unless the authorization is terminated or revoked sooner. Performed at [facility], [place] [address] [place], [place] [address]       Studies: No results found.  Scheduled Meds: . amiodarone  200 mg Oral Daily  . apixaban  5 mg Oral BID  . [START ON 12/30/2018] dronabinol  2.5 mg Oral BID AC  . letrozole  2.5 mg Oral Daily  . mouth rinse  15 mL Mouth Rinse BID  . metoprolol tartrate  25 mg Oral BID  . sodium chloride flush  3 mL  Intravenous Once    Continuous Infusions: . sodium chloride 1,000 mL (12/25/18 0438)     LOS: 3 days     Kathie Dike, MD [facility]  If 7PM-7AM, please contact night-coverage [email or web address] 12/29/2018, 8:33 PM

## 2018-12-29 NOTE — Progress Notes (Signed)
During morning medication administration, patient refused to take letrozole 2.5 mg and stated she will need to call her son to ask him if she is supposed to take this medication. After speaking with her son, patient requested to take letrozole. Nurse called pharmacy to request letrozole 2.5 mg.

## 2018-12-30 ENCOUNTER — Telehealth: Payer: Medicare HMO | Admitting: Physician Assistant

## 2018-12-30 ENCOUNTER — Telehealth: Payer: Self-pay | Admitting: *Deleted

## 2018-12-30 DIAGNOSIS — I471 Supraventricular tachycardia: Secondary | ICD-10-CM

## 2018-12-30 DIAGNOSIS — R001 Bradycardia, unspecified: Secondary | ICD-10-CM

## 2018-12-30 LAB — BASIC METABOLIC PANEL
Anion gap: 7 (ref 5–15)
BUN: 7 mg/dL — ABNORMAL LOW (ref 8–23)
CO2: 34 mmol/L — ABNORMAL HIGH (ref 22–32)
Calcium: 7.9 mg/dL — ABNORMAL LOW (ref 8.9–10.3)
Chloride: 99 mmol/L (ref 98–111)
Creatinine, Ser: 0.93 mg/dL (ref 0.44–1.00)
GFR calc Af Amer: >60 mL/min (ref >60)
GFR calc non Af Amer: 58 mL/min — ABNORMAL LOW (ref >60)
Glucose, Bld: 106 mg/dL — ABNORMAL HIGH (ref 70–99)
Potassium: 3.6 mmol/L (ref 3.5–5.1)
Sodium: 140 mmol/L (ref 135–145)

## 2018-12-30 LAB — MAGNESIUM: Magnesium: 1.7 mg/dL (ref 1.7–2.4)

## 2018-12-30 MED ORDER — TRAMADOL HCL 50 MG PO TABS: 50.0000 mg | ORAL_TABLET | Freq: Four times a day (QID) | ORAL | Status: DC | PRN

## 2018-12-30 MED ORDER — MAGIC MOUTHWASH
10.0000 mL | Freq: Once | ORAL | Status: DC
  Filled 2018-12-30: qty 10

## 2018-12-30 MED ORDER — HYDROXYZINE HCL 10 MG PO TABS
10.0000 mg | ORAL_TABLET | Freq: Three times a day (TID) | ORAL | Status: DC | PRN
  Filled 2018-12-30: qty 1

## 2018-12-30 MED ORDER — MIRTAZAPINE 15 MG PO TABS
7.5000 mg | ORAL_TABLET | Freq: Every day | ORAL | Status: DC
  Administered 2018-12-30: 7.5 mg via ORAL
  Filled 2018-12-30: qty 1

## 2018-12-30 MED ORDER — DICLOFENAC SODIUM 1 % EX GEL
2.0000 g | Freq: Four times a day (QID) | CUTANEOUS | Status: DC | PRN
  Filled 2018-12-30: qty 100

## 2018-12-30 MED ORDER — HYDROXYZINE HCL 25 MG PO TABS
25.0000 mg | ORAL_TABLET | Freq: Once | ORAL | Status: AC
  Administered 2018-12-30: 25 mg via ORAL
  Filled 2018-12-30: qty 1

## 2018-12-30 NOTE — Progress Notes (Signed)
MD ordered hydroxyzine now, patient request at bedtime, medication not given now, moved for bedtime.

## 2018-12-30 NOTE — Care Management Important Message (Signed)
Important Message  Patient Details  Name: Beverly Tucker MRN: [id number] Date of Birth: 01-Oct-1940   Medicare Important Message Given:  Yes     Shelda Altes 12/30/2018, 12:04 PM

## 2018-12-30 NOTE — Telephone Encounter (Signed)
Left detailed message for pt that her appt today, 12/30/2018 has been cancelled.  Pt is currently admitted. Have rescheduled pt for virtual with Melina Copa, PA-C 01/10/2019.

## 2018-12-30 NOTE — Progress Notes (Signed)
PROGRESS NOTE  Beverly Tucker [id number] DOB: [birth year] DOA: 12/24/2018 PCP: [name], MD  HPI/Recap of past 24 hours: HPI from Dr Beverly Tucker is an 78 y.o. female with medical history significant ofmetastatic breast cancer on oral chemotherapy, recurrent malignant left-sided pleural effusion status post Pleurx catheter placement, pneumothorax, chronic diastolic congestive heart failure, CKD stage III, hypertension, paroxysmal atrial fibrillation, paroxysmal supraventricular tachycardia presenting to the hospital with complaints of generalized weakness. Patient was admitted to the hospital 11/14 for HAP, episode of SVT, and acute on chronic diastolic congestive heart failure. She was discharged home on 11/21. Patient states since she left the hospital she has been very weak. Found to be orthostatic as well as have a right pleural effusion.   Update 12/30/2018 -Complains of insomnia, anxiety and poor appetite Ambulating around in the room with improving dyspnea on exertion  Assessment/Plan: Principal Problem:   Pleural effusion Active Problems:   CKD (chronic kidney disease) stage 3, GFR 30-59 ml/min   Metastatic breast cancer (HCC)   Malignant pleural effusion   PSVT (paroxysmal supraventricular tachycardia) (HCC)   Chronic diastolic HF (heart failure) (HCC)   UTI (urinary tract infection)   Generalized weakness   Goals of care, counseling/discussion   Palliative care by specialist   Encounter for hospice care discussion  Orthostatic hypotension Improving Continue TED hose Daily orthostatics  Right-sided pleural effusion, suspect malignant s/p Right thoracentesis on 12/25/2018 with removal of 300 cc of fluid from right-sided effusion.  Unfortunately fluid was not sent for further analysis Repeat chest x-ray showed bilateral pleural effusions with potential superimposed infiltrate in the base Patient had her left Pleurx catheter drained on 12/28/2018,  about 480 mils of serous fluid drained  Acute on chronic respiratory failure with hypoxia At baseline, patient requires 2 to 3 L of oxygen.  s/p Rt thoracentesis as well as drainage of  Lt Pleurx catheter --- oxygen chromate appears to be improving getting closer to her baseline   CKD stage IIIb Creatinine at baseline Hold home losartan Daily BMP  Persistent bradycardia/concerns about transient complete heart block on 12/30/2018-- Stop Lopressor dose to 25 mg twice daily -Cardiology consult from 12/30/2018 appreciated, cardiologist recommends against discharge home at this time due to worsening bradycardia with concerns about complete heart block - patient is to be monitored further on telemetry  -Per cardiology service okay to continue amiodarone for now continue to monitor on telemetry  ? UTI UA positive for infection UC showed multiple species, suggest recollection She completed IV Rocephin   Paroxysmal atrial fibrillation/ PSVT Heart rate as above Stop Lopressor as above,  Continue amiodarone Continue apixaban  Generalized weakness Could be related to orthostatics vs deconditioning (was just d/c'd from hospital) PT/OT eval appreciated recommend home health  Chronic diastolic congestive heart failure BNP normal Echo done 12/15/18 showed EF of 60 to 123456, grade 1 diastolic dysfunction  Continue oral Lasix  Metastatic breast cancer/fungating mass on R breast Follow-up with Dr. [name], added to treatment team as an FYI Wound care on board  Anemia of chronic disease History of CKD, metastatic breast cancer Hemoglobin stable at baseline Daily CBC  [place] Palliative consulted, patient is a full code  Anxiety and insomnia--- Atarax as needed, Remeron 7.5 mg nightly added  Anorexia Likely related to chemo/underlying malignancy.   -Marinol 2.5 mg twice daily, Remeron 7.5 mg nightly     Estimated body mass index is 28.27 kg/m as calculated from the following:   Height as  of this encounter: 5\' 5"  (1.651 m).   Weight as of this encounter: 77.1 kg.     Code Status: Full  Family Communication: At patient's request I called and discussed case with patient's son [name], questions answered  Disposition Plan:   cardiologist recommends against discharge home at this time due to worsening bradycardia with concerns about complete heart block  Consultants:  Palliative care  Procedures:  11/25: Right-sided thoracentesis with removal of 300 mL of fluid  Antimicrobials:  Rocephin completed 12/30/2018  DVT prophylaxis: Continue apixaban   Objective: Vitals:   12/30/18 0733 12/30/18 0739 12/30/18 1121 12/30/18 1524  BP: (!) 159/77 (!) 163/66 135/64 (!) 142/60  Pulse: 69  63 68  Resp: 20  20 18   Temp: 98.8 F (37.1 C)  99.1 F (37.3 C) 98.8 F (37.1 C)  TempSrc: Oral  Oral Oral  SpO2: 94%  99% 98%  Weight:      Height:        Intake/Output Summary (Last 24 hours) at 12/30/2018 1702 Last data filed at 12/30/2018 1215 Gross per 24 hour  Intake 380 ml  Output 550 ml  Net -170 ml   Filed Weights   12/28/18 0500 12/29/18 0542 12/30/18 0400  Weight: 79.2 kg 77.9 kg 77.1 kg    Exam:  Physical Exam  Gen:- Awake Alert,  In no apparent distress, anxious HEENT:- [place].AT, No sclera icterus Neck-Supple Neck,No JVD,.  Breast--- right breast with dressing on Lungs-diminished breath sounds, left more than right, no wheezing, left-sided Pleurx catheter in situ CV- S1, S2 normal, bradycardic heart rate in the 50s at times down to the 30s Abd-  +ve B.Sounds, Abd Soft, No tenderness,    Extremity/Skin:-Trace to +1 pitting edema, TED stockings psych-affect is anxious, oriented x3 Neuro-generalized weakness without new focal deficits no new focal deficits, no tremors  Data Reviewed: CBC: Recent Labs  Lab 12/24/18 1834 12/27/18 0347 12/28/18 0431  WBC 7.8 4.6 4.6  NEUTROABS  --   --  2.8  HGB 10.1* 8.6* 9.3*  HCT 33.8* 28.7* 30.9*  MCV 95.8 94.7  94.2  PLT 431* 289 [id number]   Basic Metabolic Panel: Recent Labs  Lab 12/24/18 1834 12/25/18 0410 12/27/18 0347 12/28/18 0431 12/30/18 0528  NA 139  --  140 141 140  K 4.0  --  3.6 3.8 3.6  CL 97*  --  97* 99 99  CO2 31  --  33* 34* 34*  GLUCOSE 103*  --  111* 105* 106*  BUN 22  --  13 10 7*  CREATININE 1.27*  --  1.36* 0.91 0.93  CALCIUM 8.6*  --  7.5* 7.7* 7.9*  MG  --  2.2  --   --  1.7   GFR: Estimated Creatinine Clearance: 50.3 mL/min (by C-G formula based on SCr of 0.93 mg/dL). Liver Function Tests: No results for input(s): AST, ALT, ALKPHOS, BILITOT, PROT, ALBUMIN in the last 168 hours. No results for input(s): LIPASE, AMYLASE in the last 168 hours. No results for input(s): AMMONIA in the last 168 hours. Coagulation Profile: No results for input(s): INR, PROTIME in the last 168 hours. Cardiac Enzymes: No results for input(s): CKTOTAL, CKMB, CKMBINDEX, TROPONINI in the last 168 hours. BNP (last 3 results) No results for input(s): PROBNP in the last 8760 hours. HbA1C: No results for input(s): HGBA1C in the last 72 hours. CBG: Recent Labs  Lab 12/24/18 2335  GLUCAP 85   Lipid Profile: No results for input(s): CHOL, HDL, LDLCALC, TRIG,  CHOLHDL, LDLDIRECT in the last 72 hours. Thyroid Function Tests: No results for input(s): TSH, T4TOTAL, FREET4, T3FREE, THYROIDAB in the last 72 hours. Anemia Panel: No results for input(s): VITAMINB12, FOLATE, FERRITIN, TIBC, IRON, RETICCTPCT in the last 72 hours. Urine analysis:    Component Value Date/Time   COLORURINE YELLOW 12/25/2018 0111   APPEARANCEUR HAZY (A) 12/25/2018 0111   LABSPEC 1.015 12/25/2018 0111   PHURINE 5.0 12/25/2018 0111   GLUCOSEU NEGATIVE 12/25/2018 0111   HGBUR NEGATIVE 12/25/2018 0111   BILIRUBINUR NEGATIVE 12/25/2018 0111   KETONESUR 5 (A) 12/25/2018 0111   PROTEINUR NEGATIVE 12/25/2018 0111   NITRITE NEGATIVE 12/25/2018 0111   LEUKOCYTESUR LARGE (A) 12/25/2018 0111   Sepsis Labs:  @[email or web address] (procalcitonin:4,lacticidven:4)  ) Recent Results (from the past 240 hour(s))  Urine Culture     Status: Abnormal   Collection Time: 12/25/18  1:11 AM   Specimen: Urine, Random  Result Value Ref Range Status   Specimen Description URINE, RANDOM  Final   Special Requests   Final    NONE Performed at [facility], [address], [place], [place] [address]    Culture MULTIPLE SPECIES PRESENT, SUGGEST RECOLLECTION (A)  Final   Report Status 12/26/2018 FINAL  Final  SARS CORONAVIRUS 2 (TAT 6-24 HRS) Nasopharyngeal Nasopharyngeal Swab     Status: None   Collection Time: 12/25/18  2:19 AM   Specimen: Nasopharyngeal Swab  Result Value Ref Range Status   SARS Coronavirus 2 NEGATIVE NEGATIVE Final    Comment: (NOTE) SARS-CoV-2 target nucleic acids are NOT DETECTED. The SARS-CoV-2 RNA is generally detectable in upper and lower respiratory specimens during the acute phase of infection. Negative results do not preclude SARS-CoV-2 infection, do not rule out co-infections with other pathogens, and should not be used as the sole basis for treatment or other patient management decisions. Negative results must be combined with clinical observations, patient history, and epidemiological information. The expected result is Negative. Fact Sheet for Patients: SugarRoll.be Fact Sheet for Healthcare Providers: https://www.woods-mathews.com/ This test is not yet approved or cleared by the [place] FDA and  has been authorized for detection and/or diagnosis of SARS-CoV-2 by FDA under an Emergency Use Authorization (EUA). This EUA will remain  in effect (meaning this test can be used) for the duration of the COVID-19 declaration under Section 56 4(b)(1) of the Act, 21 U.S.C. section 360bbb-3(b)(1), unless the authorization is terminated or revoked sooner. Performed at [facility], [place] [address] [place], [place] [address]       Studies: No results found.  Scheduled Meds: . amiodarone  200 mg Oral Daily  . apixaban  5 mg Oral BID  . dronabinol  2.5 mg Oral BID AC  . furosemide  40 mg Oral Daily  . letrozole  2.5 mg Oral Daily  . mouth rinse  15 mL Mouth Rinse BID  . mirtazapine  7.5 mg Oral QHS  . sodium chloride flush  3 mL Intravenous Once    Continuous Infusions: . sodium chloride 1,000 mL (12/25/18 0438)     LOS: 4 days   Roxan Hockey, MD [facility]  If 7PM-7AM, please contact night-coverage [email or web address] 12/30/2018, 5:02 PM

## 2018-12-30 NOTE — Progress Notes (Signed)
Patient went to Complete Heart Block and HR went down to 30 per CCMD. After few second HR went back to normal. Patient asymptomatic, MD Courage notified.

## 2018-12-30 NOTE — Progress Notes (Signed)
Physical Therapy Treatment Patient Details Name: Beverly Tucker MRN: [id number] DOB: 05-20-1940 Today's Date: 12/30/2018    History of Present Illness 78 y.o. 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 Pt underwent thoracentesis on 11/25.    PT Comments    Pt was able to increase activity today but still requires O2 and rest breaks.  She is mildly unsteady without an AD.  Recommend use of rollator at home, but did work on balance and gait without AD with PT.  Cont POC.      Follow Up Recommendations  Home health PT     Equipment Recommendations  Other (comment)(rollator)    Recommendations for Other Services       Precautions / Restrictions Precautions Precautions: Fall    Mobility  Bed Mobility Overal bed mobility: Needs Assistance Bed Mobility: Supine to Sit;Sit to Supine     Supine to sit: Modified independent (Device/Increase time) Sit to supine: Modified independent (Device/Increase time)      Transfers Overall transfer level: Needs assistance Equipment used: 4-wheeled walker Transfers: Sit to/from Stand Sit to Stand: Supervision         General transfer comment: performed x 4 with cues for safe hand placement  Ambulation/Gait Ambulation/Gait assistance: Supervision Gait Distance (Feet): 150 Feet Assistive device: Rolling walker (2 wheeled);None   Gait velocity: reduced   General Gait Details: Ambulated 150' with rollator: step through gait, normal step length, cues for RW.  Ambulated 50' in room without AD: pt in low guard arm position and decreased stride length but no  LOB   Stairs             Wheelchair Mobility    Modified Rankin (Stroke Patients Only)       Balance Overall balance assessment: Needs assistance Sitting-balance support: No upper extremity supported;Feet supported Sitting balance-Leahy Scale: Good     Standing balance support: No upper extremity supported Standing balance-Leahy Scale: Fair               High level balance activites: Backward walking;Other (comment) High Level Balance Comments: Ambulated forward, back, sidesteps around 4'square x 3 with contact guard: pt cautious but no LOB            Cognition Arousal/Alertness: Awake/alert Behavior During Therapy: WFL for tasks assessed/performed Overall Cognitive Status: Within Functional Limits for tasks assessed                                        Exercises      General Comments General comments (skin integrity, edema, etc.): Pt on 2 LPM O2 sats stable; required rest breaks.      Pertinent Vitals/Pain Pain Assessment: No/denies pain    Home Living                      Prior Function            PT Goals (current goals can now be found in the care plan section) Progress towards PT goals: Progressing toward goals    Frequency    Min 3X/week      PT Plan Current plan remains appropriate  Co-evaluation              AM-PAC PT "6 Clicks" Mobility   Outcome Measure  Help needed turning from your back to your side while in a flat bed without using bedrails?: None Help needed moving from lying on your back to sitting on the side of a flat bed without using bedrails?: A Little Help needed moving to and from a bed to a chair (including a wheelchair)?: None Help needed standing up from a chair using your arms (e.g., wheelchair or bedside chair)?: None Help needed to walk in hospital room?: None Help needed climbing 3-5 steps with a railing? : A Little 6 Click Score: 22    End of Session Equipment  Utilized During Treatment: Gait belt;Oxygen Activity Tolerance: Patient tolerated treatment well Patient left: in bed;with call bell/phone within reach;with bed alarm set Nurse Communication: Mobility status PT Visit Diagnosis: Unsteadiness on feet (R26.81);Muscle weakness (generalized) (M62.81)     Time: [id number] PT Time Calculation (min) (ACUTE ONLY): 24 min  Charges:  $Gait Training: 23-37 mins                     Maggie Font, PT Acute Rehab Services Pager [phone] [facility] [place] 12/30/2018, 4:49 PM

## 2018-12-30 NOTE — Progress Notes (Signed)
Patient c/o of left foot tingling. Tylenol given. Patient requesting more medications/lotion for tingling. Paged MD, awaiting on orders.

## 2018-12-30 NOTE — Consult Note (Addendum)
Cardiology Consultation:   Patient ID: Beverly Tucker; [id number]; 05/17/1940   Admit date: 12/24/2018 Date of Consult: 12/30/2018  Primary Care Provider: [name], MD Primary Cardiologist: Dr. Ena Dawley, MD   Patient Profile:   Beverly Tucker is a 78 y.o. 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 Roderic Palau.  History of Present Illness:   [name] is a 78 yo with a hx as stated above who presented to [facility] on 12/25/2018 with generalized weakness and lower extremity edema. Pt was recently discharged from a hospitalization as outlined below and reports generalized weakness, fatigue and increasing SOB since that time.   In the ED, HsT found to be elevated at 18 without c/o chest pain. EKG not suggestive of ACS with no acute changes. UA positive for leukocytes. COVID 19 negative. CXR with stable left sided pleural effusion with Pluerex catheter in place.   [name] was initially seen in the emergency department 10/05/2018 with transient SVT, also found to have ulcerations under her right breast with recommendations for close outpatient follow-up.  She was then hospitalized 10/14/2018 found to have a new pleural effusion and new breast mass. Thoracentesis showed exudative effusion with adenocarcinoma confirmed to be metastatic breast cancer with bone metastasis. At that time she had atrial fibrillation with RVR that was treated with metoprolol and Eliquis for a CHA2DS2VASc of 5. She was once again seen in the ED 10/2018 with worsening shortness of breath, edema and palpitations found to have recurrent SVT with a heart rate of to the 180s which converted to  normal sinus rhythm with adenosine.  She was ultimately admitted and treated for acute on chronic diastolic heart failure with IV diuresis.  Metoprolol was adjusted due to recurrent episodes of SVT.  Echocardiogram 11/21/2018 showed normal LVEF.  As far as her breast cancer, she has been treated with abemaciclib, letrozole, and [facility] oncology.  She was most recently seen by her service 12/14/2018 in hospital consultation after sudden onset of shortness of breath and tachycardia reported to be SVT by EMS with associated hypotension. She spontaneously converted to normal sinus rhythm. CT angiogram showed no PE but did show patchy nodularity and consolidative changes suggestive of PNA. Troponins were elevated at that time at 43, 35 and 16. BNP was stable. She was treated with IV Lasix x2 doses in the ED with improvement.  She was ultimately started on p.o. amiodarone 200 mg twice daily and cardiology follow up throughout her hospital stay. On 12/21/2018 metoprolol 50mg  BID was added to her regimen, previously noted to be on Toprol-XL. Given her history of metastatic breast cancer, she was noted to  not be a candidate for ablation or other invasive cardiac procedures.  Past Medical History:  Diagnosis Date   Anemia    Cancer ([place])    Breast    Chronic diastolic CHF (congestive heart failure) (HCC)    CKD (chronic kidney disease), stage III    GERD (gastroesophageal reflux disease)    Hypertension    Malignant pleural effusion    Metastatic breast cancer (HCC)    PAF (paroxysmal atrial fibrillation) (HCC)    PSVT (paroxysmal supraventricular tachycardia) ([place])  Past Surgical History:  Procedure Laterality Date   CHOLECYSTECTOMY     IR PERC PLEURAL DRAIN W/INDWELL CATH W/IMG GUIDE  11/08/2018   IR THORACENTESIS ASP PLEURAL SPACE W/IMG GUIDE  10/22/2018   IR THORACENTESIS ASP PLEURAL SPACE W/IMG GUIDE  12/25/2018     Prior to Admission medications   Medication Sig Start Date  End Date Taking? Authorizing Provider  acetaminophen (TYLENOL) 500 MG tablet Take 1,000 mg by mouth every 6 (six) hours as needed.   Yes [provider]  ALPRAZolam (XANAX) 0.5 MG tablet Take 0.25-0.5 mg by mouth 2 (two) times daily as needed for anxiety. 05/31/17  Yes [provider]  amiodarone (PACERONE) 200 MG tablet Take 1 tablet (200 mg total) by mouth daily. 12/20/18 01/19/19 Yes Donne Hazel, MD  apixaban (ELIQUIS) 5 MG TABS tablet Take 1 tablet (5 mg total) by mouth 2 (two) times daily. 10/23/18  Yes Jeanmarie Hubert, MD  ergocalciferol (VITAMIN D2) 1.25 MG (50000 UT) capsule Take 1 capsule (50,000 Units total) by mouth once a week. 11/04/18  Yes Brunetta Genera, MD  furosemide (LASIX) 40 MG tablet Take 1 tablet (40 mg total) by mouth daily. 11/23/18  Yes Dessa Phi, DO  letrozole [facility]) 2.5 MG tablet Take 1 tablet (2.5 mg total) by mouth daily. 12/20/18 01/19/19 Yes Donne Hazel, MD  loperamide (IMODIUM) 2 MG capsule Take 1 capsule (2 mg total) by mouth as needed for diarrhea or loose stools. 11/24/18  Yes Dessa Phi, DO  losartan (COZAAR) 50 MG tablet Take 1 tablet (50 mg total) by mouth daily. 12/21/18 01/20/19 Yes Donne Hazel, MD  metoprolol tartrate (LOPRESSOR) 50 MG tablet Take 1 tablet (50 mg total) by mouth 2 (two) times daily. 12/21/18 01/20/19 Yes Donne Hazel, MD  ondansetron (ZOFRAN ODT) 4 MG disintegrating tablet Take 1 tablet (4 mg total) by mouth every 8 (eight) hours as needed for nausea or vomiting. 12/20/18  Yes Donne Hazel, MD    Inpatient Medications: Scheduled Meds:  amiodarone  200 mg Oral Daily   apixaban  5 mg Oral BID   dronabinol  2.5 mg Oral BID AC   furosemide  40 mg Oral Daily   letrozole  2.5 mg Oral Daily   mouth rinse  15 mL Mouth Rinse BID   mirtazapine  7.5 mg Oral QHS   sodium chloride flush  3 mL Intravenous Once   Continuous Infusions:  sodium chloride 1,000 mL (12/25/18 0438)   PRN  Meds: sodium chloride, acetaminophen **OR** acetaminophen, ALPRAZolam, lidocaine, magic mouthwash, ondansetron (ZOFRAN) IV  Allergies:   No Known Allergies  Social History:   Social History   Socioeconomic History   Marital status: Widowed    Spouse name: Not on file   Number of children: Not on file   Years of education: Not on file   Highest education level: Not on file  Occupational History   Not on file  Social Needs   Financial resource strain: Not on file   Food insecurity    Worry: Not on file    Inability: Not on file   Transportation needs    Medical: Not on file    Non-medical: Not on file  Tobacco Use   Smoking status: Never Smoker   Smokeless tobacco: Never Used  Substance and Sexual Activity   Alcohol use: Never    Frequency: Never   Drug use: Never   Sexual activity: Not on file  Lifestyle   Physical activity  Days per week: Not on file    Minutes per session: Not on file   Stress: Not on file  Relationships   Social connections    Talks on phone: Not on file    Gets together: Not on file    Attends religious service: Not on file    Active member of club or organization: Not on file    Attends meetings of clubs or organizations: Not on file    Relationship status: Not on file   Intimate partner violence    Fear of current or ex partner: Not on file    Emotionally abused: Not on file    Physically abused: Not on file    Forced sexual activity: Not on file  Other Topics Concern   Not on file  Social History Narrative   Not on file    Family History:   Family History  Problem Relation Age of Onset   Diabetes Mellitus II Other        son   Family Status:  Family Status  Relation Name Status   Other  (Not Specified)    ROS:  Please see the history of present illness.  All other ROS reviewed and negative.     Physical Exam/Data:   Vitals:   12/30/18 0400 12/30/18 0733 12/30/18 0739 12/30/18 1121  BP: (!) 175/69  (!) 159/77 (!) 163/66 135/64  Pulse: 67 69  63  Resp: 20 20  20   Temp: 98.3 F (36.8 C) 98.8 F (37.1 C)  99.1 F (37.3 C)  TempSrc: Oral Oral  Oral  SpO2: 96% 94%  99%  Weight: 77.1 kg     Height:        Intake/Output Summary (Last 24 hours) at 12/30/2018 1414 Last data filed at 12/30/2018 0400 Gross per 24 hour  Intake 240 ml  Output 652 ml  Net -412 ml   Filed Weights   12/28/18 0500 12/29/18 0542 12/30/18 0400  Weight: 79.2 kg 77.9 kg 77.1 kg   Body mass index is 28.27 kg/m.   General: Well developed, NAD Neck: Negative for carotid bruits. No JVD Lungs: Bilateral lower lobe crackles. No wheezes. On 3L. Breathing is unlabored. Cardiovascular: RRR with S1 S2. No murmurs Abdomen: Soft, non-tender, non-distended. No obvious abdominal masses. Extremities: Mild L>R 1+ edema. DP pulses 1+ bilaterally Neuro: Alert and oriented. No focal deficits. No facial asymmetry. MAE spontaneously. Psych: Responds to questions appropriately with normal affect.     EKG:  The EKG was personally reviewed and demonstrates:  12/30/2018 NSR with 1st degree AV block and no acute change, HR 64 Telemetry:  Telemetry was personally reviewed and demonstrates: NSR with HR in the 60-70's   Relevant CV Studies:  ECHO 12/15/2018:   1. Left ventricular ejection fraction, by visual estimation, is 60 to 65%. The left ventricle has normal function. There is no left ventricular hypertrophy.  2. Left ventricular diastolic parameters are consistent with Grade I diastolic dysfunction (impaired relaxation).  3. Global right ventricle has normal systolic function.The right ventricular size is normal. No increase in right ventricular wall thickness.  4. Left atrial size was normal.  5. Right atrial size was normal.  6. Severe mitral annular calcification.  7. The mitral valve is normal in structure. Trace mitral valve regurgitation. No evidence of mitral stenosis.  8. The tricuspid valve is normal in  structure. Tricuspid valve regurgitation is mild.  9. The aortic valve is normal in structure. Aortic valve regurgitation is trivial. Mild  aortic valve sclerosis without stenosis. 10. The pulmonic valve was normal in structure. Pulmonic valve regurgitation is not visualized. 11. The inferior vena cava is normal in size with greater than 50% respiratory variability, suggesting right atrial pressure of 3 mmHg.  Laboratory Data:  Chemistry Recent Labs  Lab 12/27/18 0347 12/28/18 0431 12/30/18 0528  NA 140 141 140  K 3.6 3.8 3.6  CL 97* 99 99  CO2 33* 34* 34*  GLUCOSE 111* 105* 106*  BUN 13 10 7*  CREATININE 1.36* 0.91 0.93  CALCIUM 7.5* 7.7* 7.9*  GFRNONAA 37* >60 58*  GFRAA 43* >60 >60  ANIONGAP 10 8 7     Total Protein  Date Value Ref Range Status  12/21/2018 5.6 (L) 6.5 - 8.1 g/dL Final   Albumin  Date Value Ref Range Status  12/21/2018 2.6 (L) 3.5 - 5.0 g/dL Final   AST  Date Value Ref Range Status  12/21/2018 14 (L) 15 - 41 U/L Final  11/13/2018 18 15 - 41 U/L Final   ALT  Date Value Ref Range Status  12/21/2018 12 0 - 44 U/L Final  11/13/2018 16 0 - 44 U/L Final   Alkaline Phosphatase  Date Value Ref Range Status  12/21/2018 75 38 - 126 U/L Final   Total Bilirubin  Date Value Ref Range Status  12/21/2018 0.5 0.3 - 1.2 mg/dL Final  11/13/2018 0.3 0.3 - 1.2 mg/dL Final   Hematology Recent Labs  Lab 12/24/18 1834 12/27/18 0347 12/28/18 0431  WBC 7.8 4.6 4.6  RBC 3.53* 3.03* 3.28*  HGB 10.1* 8.6* 9.3*  HCT 33.8* 28.7* 30.9*  MCV 95.8 94.7 94.2  MCH 28.6 28.4 28.4  MCHC 29.9* 30.0 30.1  RDW 15.6* 15.8* 15.5  PLT 431* 289 306   Cardiac EnzymesNo results for input(s): TROPONINI in the last 168 hours. No results for input(s): TROPIPOC in the last 168 hours.  BNP Recent Labs  Lab 12/25/18 0004  BNP 68.2    DDimer No results for input(s): DDIMER in the last 168 hours. TSH:  Lab Results  Component Value Date   TSH 2.545 11/16/2018   Lipids: Lab  Results  Component Value Date   CHOL 165 11/17/2018   HDL 36 (L) 11/17/2018   LDLCALC 103 (H) 11/17/2018   TRIG 132 11/17/2018   CHOLHDL 4.6 11/17/2018   HgbA1c: Lab Results  Component Value Date   HGBA1C 6.1 (H) 11/17/2018    Radiology/Studies:  Dg Chest Port 1 View  Result Date: 12/28/2018 CLINICAL DATA:  Pleural effusions EXAM: PORTABLE CHEST 1 VIEW COMPARISON:  December 26, 2018 FINDINGS: PleurX catheter remains on the left, unchanged in position. There remains loculated pleural effusion on the left with a somewhat smaller pleural effusion on the right. There is atelectatic change in each lung base region. There may be a degree of superimposed infiltrate in the left base and medial right base regions. No new opacity evident. Heart is mildly enlarged, stable, with pulmonary vascularity normal. There is aortic atherosclerosis. No pneumothorax. No bone lesions. IMPRESSION: Stable PleurX catheter positioning. Bilateral pleural effusions remain stable with atelectasis and potential superimposed infiltrate in the bases. No new opacity. Stable cardiac prominence. Aortic Atherosclerosis (ICD10-I70.0). Electronically Signed   By: Lowella Grip III M.D.   On: 12/28/2018 08:01    Assessment and Plan:   1.  1st degree AV block with hx of PSVT and PAF: -Pt hospitalized 10/14/2018 and found to have a new pleural effusion and new breast mass with thoracentesis showing  exudative effusion with adenocarcinoma confirmed to be metastatic breast cancer with bone metastasis. At that time, she had PAF with RVR that was treated with metoprolol and Eliquis for a CHA2DS2VASc of 5>>>then seen for SOB and edema and palpitations found to have SVT with a heart rate of to the 180s which converted to normal sinus rhythm with adenosine.  -Most recently seen by her service 12/15/2018 for recurrent SVT, ultimately started on p.o. amiodarone 200 mg twice daily and metoprolol 50mg  BID with control -Admitted once again  for generalized weakness and fatigue found to be mildly bradycardic in which her Lopressor was reduced to 25 mg twice daily -AV blocking agents currently on hold -Amiodarone 200 mg daily continued -Continue Eliquis -Continue to monitor closely on telemetry for recurrent episodes>>not tolerating BB>>continue to hold for today and tomorrow and cardiology with continue to reassess response. May take several days to washout BB.  -Would hold off on aggressive therapies at this time given her comorbid conditions with breast cancer with metastasis and chemo treatment with chronic pleural effusions -MD to follow with final recommendations   2. Chronic diastolic congestive heart failure: -Echocardiogram performed 12/15/2018 with normal LV function and DD  -BNP normal however has 2-3+ LE edema and DD>> initially treated with IV Lasix however this is been transitioned to home dose p.o. Lasix.  3. Elevated troponin: -HsT elevated on admission at 18>20 not consistent with ACS -EKG without acute changes  -Denies anginal symptoms -Likely in the setting of acute illness, demand ischemia  4. Right-sided pleural effusion: -Has known chronic pleural effusions for which Pluerex catheter has previously been placed. CXR on presentation with slight increase in right sided effusion when compared to last hospitalization  -Now s/p thoracentesis with 327ml output>>fluid not sent for cytology with repeat performed 12/28/2018  5 CKD stage III: -Creatinine, 0.93 today improved from 1.36 on 12/27/2018 -Baseline appears to be in the 1.2-1.3 range -Continue to hold losartan   Hospital problems managed by primary team include: UTI Metastatic breast cancer  Anemia of chronic disease Anorexia Goals of care discussion   For questions or updates, please contact [facility] Please consult [email or web address] for contact info under Cardiology/STEMI.   SignedKathyrn Drown NP-C [facility] Pager:  [phone] 12/30/2018 2:14 PM  ---------------------------------------------------------------------------------------------   History and all data above reviewed.  Patient examined.  I agree with the findings as above.  WILLISHA MORVAY is a 78 yo female with stage 4 breast cancer on whom we were consulted for bradycardia/question heart block in the setting of recurrent pleural effusions, PAF and PSVT on amiodarone and metoprolol. She is tired and feels she is not eating or sleeping properly, but otherwise has no acute cardiovascular complaints. Telemetry documented a bradycardic episode with an irregular baseline making P wave determination challenging to identify.  Constitutional: No acute distress Eyes: pupils equally round and reactive to light, sclera non-icteric, normal conjunctiva and lids ENMT: normal dentition, moist mucous membranes Cardiovascular: regular rhythm, normal rate, no murmurs. S1 and S2 normal. Radial pulses normal bilaterally. No jugular venous distention.  Respiratory: crackles bilaterally in the bases, breathing comfortably on O2.  GI : normal bowel sounds, soft and nontender. No distention.   MSK: extremities warm, well perfused. Mild bilateral edema.  NEURO: grossly nonfocal exam, moves all extremities. PSYCH: alert and oriented x 3, normal mood and affect.   All available labs, radiology testing, previous records reviewed. Agree with documented assessment and plan of my colleague as stated above with the following additions  or changes:  Principal Problem:   Pleural effusion Active Problems:   CKD (chronic kidney disease) stage 3, GFR 30-59 ml/min   Metastatic breast cancer (HCC)   Malignant pleural effusion   PSVT (paroxysmal supraventricular tachycardia) (HCC)   Chronic diastolic HF (heart failure) (HCC)   UTI (urinary tract infection)   Generalized weakness   Goals of care, counseling/discussion   Palliative care by specialist   Encounter for hospice  care discussion    Plan: Ms. Colledge has had recent episodes of SVT and has a history of Afib. She currently had a telemetry episode of asymptomatic bradycardia. Her metoprolol has been held in this setting. This is reasonable to try.   Would not discontinue amiodarone just yet, as this maybe helping her maintain sinus rhythm. With her pleural effusion she may be more prone to atrial arrhythmias. It may be very challenging to manage SVT without beta blockade. Would recommend monitoring on telemetry overnight and holding metoprolol.  Agree with involving palliative care for symptom management.   We will follow along and review telemetry.  Length of Stay:  LOS: 4 days   Elouise Munroe, MD [facility] 4:41 PM  12/30/2018

## 2018-12-31 ENCOUNTER — Ambulatory Visit: Payer: Medicare HMO

## 2018-12-31 ENCOUNTER — Telehealth: Payer: Self-pay | Admitting: *Deleted

## 2018-12-31 ENCOUNTER — Ambulatory Visit: Payer: Medicare HMO | Admitting: Hematology

## 2018-12-31 DIAGNOSIS — J90 Pleural effusion, not elsewhere classified: Secondary | ICD-10-CM

## 2018-12-31 LAB — BASIC METABOLIC PANEL
Anion gap: 15 (ref 5–15)
BUN: 6 mg/dL — ABNORMAL LOW (ref 8–23)
CO2: 30 mmol/L (ref 22–32)
Calcium: 8.5 mg/dL — ABNORMAL LOW (ref 8.9–10.3)
Chloride: 97 mmol/L — ABNORMAL LOW (ref 98–111)
Creatinine, Ser: 0.84 mg/dL (ref 0.44–1.00)
GFR calc Af Amer: >60 mL/min (ref >60)
GFR calc non Af Amer: >60 mL/min (ref >60)
Glucose, Bld: 105 mg/dL — ABNORMAL HIGH (ref 70–99)
Potassium: 3.2 mmol/L — ABNORMAL LOW (ref 3.5–5.1)
Sodium: 142 mmol/L (ref 135–145)

## 2018-12-31 LAB — CBC
HCT: 33.1 % — ABNORMAL LOW (ref 36.0–46.0)
Hemoglobin: 9.8 g/dL — ABNORMAL LOW (ref 12.0–15.0)
MCH: 27.9 pg (ref 26.0–34.0)
MCHC: 29.6 g/dL — ABNORMAL LOW (ref 30.0–36.0)
MCV: 94.3 fL (ref 80.0–100.0)
Platelets: 295 10*3/uL (ref 150–400)
RBC: 3.51 MIL/uL — ABNORMAL LOW (ref 3.87–5.11)
RDW: 15.5 % (ref 11.5–15.5)
WBC: 5 10*3/uL (ref 4.0–10.5)
nRBC: 0 % (ref 0.0–0.2)

## 2018-12-31 MED ORDER — MIRTAZAPINE 15 MG PO TABS: 15.0000 mg | ORAL_TABLET | Freq: Every day | ORAL | 3 refills | Status: AC

## 2018-12-31 MED ORDER — MAGNESIUM SULFATE 2 GM/50ML IV SOLN
2.0000 g | Freq: Once | INTRAVENOUS | Status: AC
  Administered 2018-12-31: 10:00:00 2 g via INTRAVENOUS
  Filled 2018-12-31: qty 50

## 2018-12-31 MED ORDER — FUROSEMIDE 40 MG PO TABS: 40.0000 mg | ORAL_TABLET | Freq: Every day | ORAL | 3 refills | Status: DC

## 2018-12-31 MED ORDER — HYDROXYZINE HCL 25 MG PO TABS: 25.0000 mg | ORAL_TABLET | Freq: Three times a day (TID) | ORAL | 2 refills | Status: AC | PRN

## 2018-12-31 MED ORDER — ALPRAZOLAM 0.5 MG PO TABS: 0.2500 mg | ORAL_TABLET | Freq: Two times a day (BID) | ORAL | 0 refills | Status: AC | PRN

## 2018-12-31 MED ORDER — POTASSIUM CHLORIDE CRYS ER 20 MEQ PO TBCR
40.0000 meq | EXTENDED_RELEASE_TABLET | ORAL | Status: AC
  Administered 2018-12-31 (×2): 40 meq via ORAL
  Filled 2018-12-31 (×2): qty 2

## 2018-12-31 NOTE — Telephone Encounter (Signed)
Son called - patient being discharged from hospital this afternoon, but today's appt cancelled - hospital d/c will be too late for her to come. Son wants her seen by Dr. [name] soon so she can restart Verzenio sometime soon if appropriate. Dr. [name] given information and question.

## 2018-12-31 NOTE — Progress Notes (Addendum)
Progress Note  Patient Name: Beverly Tucker Date of Encounter: 12/31/2018  Primary Cardiologist: Ena Dawley, MD   Subjective   No further CHB on telemetry. Patient maintaining NSR, rate controlled.   Inpatient Medications    Scheduled Meds: . amiodarone  200 mg Oral Daily  . apixaban  5 mg Oral BID  . dronabinol  2.5 mg Oral BID AC  . furosemide  40 mg Oral Daily  . letrozole  2.5 mg Oral Daily  . magic mouthwash  10 mL Oral Once  . mouth rinse  15 mL Mouth Rinse BID  . mirtazapine  7.5 mg Oral QHS  . potassium chloride  40 mEq Oral Q3H  . sodium chloride flush  3 mL Intravenous Once   Continuous Infusions: . sodium chloride 1,000 mL (12/25/18 0438)  . magnesium sulfate bolus IVPB     PRN Meds: sodium chloride, acetaminophen **OR** acetaminophen, ALPRAZolam, diclofenac Sodium, hydrOXYzine, lidocaine, magic mouthwash, ondansetron (ZOFRAN) IV, traMADol   Vital Signs    Vitals:   12/30/18 1524 12/31/18 0348 12/31/18 0500 12/31/18 0900  BP: (!) 142/60 (!) 157/81  (!) 91/59  Pulse: 68 68  87  Resp: 18 20  17   Temp: 98.8 F (37.1 C) 98.6 F (37 C)  99.5 F (37.5 C)  TempSrc: Oral Oral  Oral  SpO2: 98% (!) 74% 100% 100%  Weight:  77.1 kg    Height:        Intake/Output Summary (Last 24 hours) at 12/31/2018 0957 Last data filed at 12/31/2018 0348 Gross per 24 hour  Intake 540 ml  Output 600 ml  Net -60 ml   Last 3 Weights 12/31/2018 12/30/2018 12/29/2018  Weight (lbs) 169 lb 14.4 oz 169 lb 14.4 oz 171 lb 11.2 oz  Weight (kg) 77.066 kg 77.066 kg 77.883 kg      Telemetry    NSR, HR 60-70s; First degree AV block (PRI 0.27sec) - Personally Reviewed  ECG    No new - Personally Reviewed  Physical Exam   GEN: No acute distress.   Neck: No JVD Cardiac: RRR, no murmurs, rubs, or gallops.  Respiratory: Clear to auscultation bilaterally. GI: Soft, nontender, non-distended  MS: Trace edema; No deformity. Neuro:  Nonfocal  Psych: Normal affect   Labs    High Sensitivity Troponin:   Recent Labs  Lab 12/15/18 0421 12/15/18 0711 12/17/18 0812 12/25/18 0004 12/25/18 0410  TROPONINIHS 43* 35* 16 18* 20*      Chemistry Recent Labs  Lab 12/28/18 0431 12/30/18 0528 12/31/18 0524  NA 141 140 142  K 3.8 3.6 3.2*  CL 99 99 97*  CO2 34* 34* 30  GLUCOSE 105* 106* 105*  BUN 10 7* 6*  CREATININE 0.91 0.93 0.84  CALCIUM 7.7* 7.9* 8.5*  GFRNONAA >60 58* >60  GFRAA >60 >60 >60  ANIONGAP 8 7 15      Hematology Recent Labs  Lab 12/27/18 0347 12/28/18 0431 12/31/18 0524  WBC 4.6 4.6 5.0  RBC 3.03* 3.28* 3.51*  HGB 8.6* 9.3* 9.8*  HCT 28.7* 30.9* 33.1*  MCV 94.7 94.2 94.3  MCH 28.4 28.4 27.9  MCHC 30.0 30.1 29.6*  RDW 15.8* 15.5 15.5  PLT 289 306 295    BNP Recent Labs  Lab 12/25/18 0004  BNP 68.2     DDimer No results for input(s): DDIMER in the last 168 hours.   Radiology    No results found.  Cardiac Studies   ECHO 12/15/2018:  1. Left ventricular ejection  fraction, by visual estimation, is 60 to 65%. The left ventricle has normal function. There is no left ventricular hypertrophy. 2. Left ventricular diastolic parameters are consistent with Grade I diastolic dysfunction (impaired relaxation). 3. Global right ventricle has normal systolic function.The right ventricular size is normal. No increase in right ventricular wall thickness. 4. Left atrial size was normal. 5. Right atrial size was normal. 6. Severe mitral annular calcification. 7. The mitral valve is normal in structure. Trace mitral valve regurgitation. No evidence of mitral stenosis. 8. The tricuspid valve is normal in structure. Tricuspid valve regurgitation is mild. 9. The aortic valve is normal in structure. Aortic valve regurgitation is trivial. Mild aortic valve sclerosis without stenosis. 10. The pulmonic valve was normal in structure. Pulmonic valve regurgitation is not visualized. 11. The inferior vena cava is normal in size with  greater than 50% respiratory variability, suggesting right atrial pressure of 3 mmHg.   Patient Profile     78 y.o. 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   Assessment & Plan    First degree AV block/hx of PSVT and PAF Patient with h/o of PAF RVR/PSVT on metoprolol and amiodarone. She was admitted for weakness and bradycardia and noted to have an episode of CHB. BB was stopped. Has been in NSR since admission.   - continue Amio - continue Eliquis - overnight no further CHB. Does have intermittent First degree HB - Plan to discontinue BB, this can be reviewed as an outpatient. -Continue amiodarone - continue telemetry  Chronic diastolic CHF - Echo [id number] with normal LV function and DD - BNP normal but was noted to have lower leg edema. IV lasix transitioned to home dose lasix  Right sided pleural effusion - known chronic pleural effusion for which Pluerex catheter was placed. CXR with right sided pleural effusion - s/p Thoracentesis   CKD stage III - creatinine improved, 0.84 today - can likely restart losartan   For questions or updates, please contact [facility] Please consult [email or web address] for contact info under        Signed, Cadence Ninfa Meeker, PA-C  12/31/2018, 9:57 AM    ---------------------------------------------------------------------------------------------   History and all data above reviewed.  Patient examined.  I agree with the findings as above.  [place] appears much brighter today, and feels well.  No further high-grade AV block was noted on telemetry.  Constitutional: No acute distress Eyes: pupils equally round and reactive to light, sclera non-icteric, normal conjunctiva and  lids ENMT: normal dentition, moist mucous membranes Cardiovascular: regular rhythm, normal rate, no murmurs. S1 and S2 normal. Radial pulses normal bilaterally. No jugular venous distention.  Respiratory: clear to auscultation bilaterally GI : normal bowel sounds, soft and nontender. No distention.   MSK: extremities warm, well perfused. No edema.  NEURO: grossly nonfocal exam, moves all extremities. PSYCH: alert and oriented x 3, normal mood and affect.   All available labs, radiology testing, previous records reviewed. Agree with documented assessment and plan of my colleague as stated above with the following additions or changes:  Principal Problem:   Pleural effusion Active Problems:   CKD (chronic kidney disease) stage 3, GFR 30-59 ml/min   Metastatic breast cancer (HCC)   Malignant pleural effusion   PSVT (  paroxysmal supraventricular tachycardia) (HCC)   Chronic diastolic HF (heart failure) (HCC)   UTI (urinary tract infection)   Generalized weakness   Goals of care, counseling/discussion   Palliative care by specialist   Encounter for hospice care discussion    Plan: I have independently reviewed the telemetry and have not seen any further episodes of high-grade AV block.  First-degree AV block is noted.  She had a single episode which was concerning for high-grade AV block, however the baseline is irregular and this interpretation is slightly challenging.  Nevertheless, it would be advisable to discontinue metoprolol for a period of time and monitor on telemetry while she remains in hospital for any further episodes.  If she does well in this regard, I would not discontinue her amiodarone at this time, as this may be helping her stay in sinus rhythm despite her recurrent pleural effusion management.  I have discussed all this with the patient and she verbalized understanding.  She may require as needed doses of metoprolol as needed for fast heart rhythms such as PSVT.  I would  recommend close follow-up as an outpatient so that this can be evaluated further.  Cardiology will see as needed while in hospital.  Time Spent Directly with Patient:  I have spent a total of 25 minutes with the patient reviewing hospital notes, telemetry, EKGs, labs and examining the patient as well as establishing an assessment and plan that was discussed personally with the patient.  > 50% of time was spent in direct patient care.  Length of Stay:  LOS: 5 days   Elouise Munroe, MD [facility] 2:19 PM  12/31/2018

## 2018-12-31 NOTE — TOC Initial Note (Signed)
Transition of Care [facility]) - Initial/Assessment Note    Patient Details  Name: Beverly Tucker MRN: [id number] Date of Birth: 21-Jul-1940  Transition of Care [facility]) CM/SW Contact:    Zenon Mayo, RN Phone Number: 12/31/2018, 1:37 PM  Clinical Narrative:                 Patient from home alone, she is active with [facility] for [facility], HHPT, HHAIDE, will need to resume at dc.  NCM informed MD to put orders in for [facility], [place], [place].  NCM notified Tommi Rumps with [name] that patient will be discharged today.  She has pleurx drain, the Staff RN states MD wants her to be drained Tues, THurs and Sat.  She states has transportation at discharge.  She states she has no problems getting her medications.  Expected Discharge Plan: [place] Barriers to Discharge: No Barriers Identified   Patient Goals and CMS Choice Patient states their goals for this hospitalization and ongoing recovery are:: to get better CMS Medicare.gov Compare Post Acute Care list provided to:: Patient Choice offered to / list presented to : Patient  Expected Discharge Plan and Services Expected Discharge Plan: Ault In-house Referral: NA Discharge Planning Services: CM Consult Post Acute Care Choice: [place] arrangements for the past 2 months: Single Family Home                 DME Arranged: (NA)         HH Arranged: RN, PT, Nurse's Aide HH Agency: [place] Date [facility] Agency Contacted: 12/31/18 Time HH Agency Contacted: 21 Representative spoke with at [place]: Tommi Rumps  Prior Living Arrangements/Services Living arrangements for the past 2 months: [place] with:: Self Patient language and need for interpreter reviewed:: Yes Do you feel safe going back to the place where you live?: Yes      Need for Family Participation in Patient Care: No (Comment) Care giver support system in place?: No (comment)   Criminal Activity/Legal Involvement  Pertinent to Current Situation/Hospitalization: No - Comment as needed  Activities of Daily Living Home Assistive Devices/Equipment: Dentures (specify type), Oxygen, Walker (specify type) ADL Screening (condition at time of admission) Patient's cognitive ability adequate to safely complete daily activities?: Yes Is the patient deaf or have difficulty hearing?: No Does the patient have difficulty seeing, even when wearing glasses/contacts?: No Does the patient have difficulty concentrating, remembering, or making decisions?: No Patient able to express need for assistance with ADLs?: Yes Does the patient have difficulty dressing or bathing?: No Independently performs ADLs?: Yes (appropriate for developmental age) Does the patient have difficulty walking or climbing stairs?: Yes Weakness of Legs: Both Weakness of Arms/Hands: Both  Permission Sought/Granted                  Emotional Assessment Appearance:: Appears stated age Attitude/Demeanor/Rapport: Engaged Affect (typically observed): Appropriate Orientation: : Oriented to Self, Oriented to Place, Oriented to  Time, Oriented to Situation Alcohol / Substance Use: Not Applicable Psych Involvement: No (comment)  Admission diagnosis:  Peripheral edema [R60.9] Pleural effusion [J90] Generalized weakness [R53.1] Patient Active Problem List   Diagnosis Date Noted  . Goals of care, counseling/discussion   . Palliative care by specialist   . Encounter for hospice care discussion   . UTI (urinary tract infection) 12/25/2018  . Generalized weakness 12/25/2018  . Hypokalemia   . Community acquired pneumonia   . Hyperkalemia   .  Gastroesophageal reflux disease without esophagitis   . Malnutrition of moderate degree 11/18/2018  . PSVT (paroxysmal supraventricular tachycardia) ([place]) 11/16/2018  . Chronic diastolic HF (heart failure) ([place]) 11/16/2018  . Elevated troponin 11/16/2018  . Leukocytosis 11/16/2018  . Atrial fibrillation,  chronic ([place]) 11/16/2018  . Malignant pleural effusion   . 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 11/06/2018  . AKI (acute kidney injury) ([place]) 11/06/2018  . AF (paroxysmal atrial fibrillation) ([place]) 11/06/2018  . Pleural effusion 11/06/2018  . Bone metastases ([place]) 11/04/2018  . Metastatic breast cancer ([place])   . Pneumothorax   . Gram-positive bacteremia   . Mass of breast, right 10/15/2018  . Acute CHF (congestive heart failure) ([place]) 10/15/2018  . Shortness of breath 10/14/2018  . CKD (chronic kidney disease) stage 3, GFR 30-59 ml/min 02/06/2018  . HTN (hypertension) 12/01/2015   PCP:  [name], MD Pharmacy:   [id number] - [place], [place] - [address] [place] [address] Phone: [phone] Fax: [place], [place] [address] [place] [place] [address] Phone: [phone] Fax: [phone]  [facility] [place], [place] - [address] [place] [address] Phone: [phone] Fax: [phone]     Social Determinants of Health (SDOH) Interventions    Readmission Risk Interventions Readmission Risk Prevention Plan 12/31/2018 12/20/2018 11/24/2018  Transportation Screening Complete Complete Complete  PCP or Specialist Appt within 5-7 Days - - -  PCP or Specialist Appt within 3-5 Days - - Complete  Home Care Screening - - -  Medication Review (RN CM) - - -  [place] or [place] - - Complete  Social Work Consult for [place] Planning/Counseling - - Complete  Palliative Care Screening - - Not Applicable  Medication Review Press photographer) Complete Complete Complete  PCP or Specialist appointment within 3-5 days of discharge Complete Complete -  [place] or Home Care Consult Complete Complete -  SW Recovery Care/Counseling Consult Complete Complete -  Palliative Care  Screening Not Applicable Not Applicable -  [place] Not Applicable Not Applicable -  Some recent data might be hidden

## 2018-12-31 NOTE — Discharge Summary (Signed)
Beverly Tucker, is a 78 y.o. female  DOB 1940/04/27  MRN [id number].  Admission date:  12/24/2018  Admitting Physician  Shela Leff, MD  Discharge Date:  12/31/2018   Primary MD  [name], MD  Recommendations for primary care physician for things to follow:   1)Very low-salt diet advised 2)Weigh yourself daily, call if you gain more than 3 pounds in 1 day or more than 5 pounds in 1 week as your diuretic medications may need to be adjusted 3)Limit your Fluid  intake to no more than 60 ounces (1.8 Liters) per day 4) follow-up with your oncologist Dr. [name] as previously scheduled 5) you should call the cardiology office within the next 5 days if you do not get a call back to schedule a follow-up appointment with your cardiologist 6) please STOP  metoprolol until you are seen and reevaluated by your cardiologist as outpatient,  7)please continue amiodarone 8) your left chest Pleurx catheter needs to be drained 3 times a week preferably on Tuesdays Thursdays and Saturdays 9) you are taking the blood thinner Eliquis/apixaban so please avoid Avoid ibuprofen/Advil/Aleve/Motrin/Goody Powders/Naproxen/BC powders/Meloxicam/Diclofenac/Indomethacin and other Nonsteroidal anti-inflammatory medications as these will make you more likely to bleed and can cause stomach ulcers, can also cause Kidney problems.   Admission Diagnosis  Peripheral edema [R60.9] Pleural effusion [J90] Generalized weakness [R53.1]   Discharge Diagnosis  Peripheral edema [R60.9] Pleural effusion [J90] Generalized weakness [R53.1]    Principal Problem:   Pleural effusion Active Problems:   CKD (chronic kidney disease) stage 3, GFR 30-59 ml/min   Metastatic breast cancer (HCC)   Malignant pleural effusion   PSVT (paroxysmal supraventricular tachycardia) (HCC)   Chronic diastolic HF (heart failure) (HCC)   UTI (urinary tract  infection)   Generalized weakness   Goals of care, counseling/discussion   Palliative care by specialist   Encounter for hospice care discussion      Past Medical History:  Diagnosis Date   Anemia    Cancer ([place])    Breast    Chronic diastolic CHF (congestive heart failure) (HCC)    CKD (chronic kidney disease), stage III    GERD (gastroesophageal reflux disease)    Hypertension    Malignant pleural effusion    Metastatic breast cancer (HCC)    PAF (paroxysmal atrial fibrillation) (HCC)    PSVT (paroxysmal supraventricular tachycardia) (HCC)     Past Surgical History:  Procedure Laterality Date   CHOLECYSTECTOMY     IR PERC PLEURAL DRAIN W/INDWELL CATH W/IMG GUIDE  11/08/2018   IR THORACENTESIS ASP PLEURAL SPACE W/IMG GUIDE  10/22/2018   IR THORACENTESIS ASP PLEURAL SPACE W/IMG GUIDE  12/25/2018       HPI  from the history and physical done on the day of admission:    Chief Complaint: Generalized weakness, lower extremity edema  HPI: Beverly Tucker is a 78 y.o. female with medical history significant of metastatic breast cancer on oral chemotherapy, recurrent malignant left-sided pleural effusion status post Pleurx catheter placement, pneumothorax,  chronic diastolic congestive heart failure, CKD stage III, hypertension, paroxysmal atrial fibrillation, paroxysmal supraventricular tachycardia presenting to the hospital with complaints of generalized weakness and lower extremity edema.  Patient was admitted to the hospital 11/14 for HAP, episode of SVT, and acute on chronic diastolic congestive heart failure.  She was discharged home on 11/21.  Patient states since she left the hospital she has been very weak.  Yesterday while walking to the kitchen she felt short of breath.  She has not taken her chemotherapy drug for the past 3 days.  She sometimes feels nauseous.  She is concerned about the medications that were prescribed at the time of discharge.  Denies fevers,  chills, chest pain, cough, vomiting, or abdominal pain.  She is using 2 L supplemental oxygen at home.  Reports having worsening bilateral lower extremity edema.  ED Course: Afebrile.  Satting well on 2 L supplemental oxygen (same as home requirement).  No leukocytosis.  Hemoglobin 10.1, stable compared to recent labs.  Blood glucose 103.  Creatinine 1.2, no significant change compared to recent labs.  High-sensitivity troponin 18.  EKG not suggestive of ACS.  BNP normal.  UA with large amount of leukocytes and greater than 50 WBCs.  SARS-CoV-2 test pending.  Chest x-ray showing stable left-sided pleural effusion with Pleurx catheter in place and slight increase in right-sided pleural effusion when compared to prior study.   Hospital Course:     -HPI/Recap of past 24 hours: HPI from Dr Quintella Baton an 78 y.o.femalewith medical history significant ofmetastatic breast cancer on oral chemotherapy, recurrent malignant left-sided pleural effusion status post Pleurx catheter placement, pneumothorax, chronic diastolic congestive heart failure, CKD stage III, hypertension, paroxysmal atrial fibrillation, paroxysmal supraventricular tachycardia presenting to the hospital with complaints of generalized weakness.Patient was admitted to the hospital 11/14 for HAP, episode of SVT, and acute on chronic diastolic congestive heart failure. She was discharged home on 11/21. Patient states since she left the hospital she has been very weak.Found to be orthostatic as well as have a right pleural effusion.   Assessment/Plan: Principal Problem:   Pleural effusion Active Problems:   CKD (chronic kidney disease) stage 3, GFR 30-59 ml/min   Metastatic breast cancer (HCC)   Malignant pleural effusion   PSVT (paroxysmal supraventricular tachycardia) (HCC)   Chronic diastolic HF (heart failure) (HCC)   UTI (urinary tract infection)   Generalized weakness   Goals of care, counseling/discussion    Palliative care by specialist   Encounter for hospice care discussion  Orthostatic hypotension Improved Continue TED hose -Continue to hold losartan, metoprolol has been discontinued,  Right-sided pleural effusion, suspect malignant s/p Right thoracentesis on 12/25/2018 with removal of 300 cc of fluid from right-sided effusion.  Unfortunately fluid was not sent for further analysis Repeat chest x-ray showed bilateral pleural effusions with potential superimposed infiltrate in the base Patient had her left Pleurx catheter drained on 12/28/2018, about 480 mils of serous fluid drained, 360 ml of serous fluid removed from left Pleurx catheter on 12/31/2018 -Patient is advised to drain left Pleurx catheter on Tuesdays Thursdays and Saturdays -May need repeat thoracentesis on the right with fluid analysis and possible Pleurx catheter placement if right sided pleural effusion reaccumulation -continue Lasix  Acute on chronic respiratory failure with hypoxia At baseline, patient requires 2 to 3 L of oxygen.  s/p Rt thoracentesis as well as drainage of  Lt Pleurx catheter --- oxygen requirement appears to be back to her baseline   H/o CKD-  Creatinine currently 0.84 with GFR over 60 Continue to hold losartan due to BP concerns   Persistent bradycardia/concerns about transient complete heart block on 12/30/2018-- Stopped Lopressor -Cardiology consult from 12/30/2018  appreciated,  verified that it was okay to discharge home with cardiologist Dr. Margaretann Loveless prior to dc home - Overnight no further high degree AV block patient has intermittent first-degree AV block -Per cardiology service okay to continue amiodarone   ? UTI UA positive for infection UC showed multiple species, suggest recollection She completed IV Rocephin   Paroxysmal atrial fibrillation/ PSVT Heart rate as above Stop Lopressor as above,  Continue amiodarone Continue apixaban  Generalized weakness Could be related to  orthostatics vs deconditioning (was just d/c'd from hospital) PT/OT eval appreciated recommend home health  Chronic diastolic congestive heart failure BNP normal Echo done 12/15/18 showed EF of 60 to 123456, grade 1 diastolic dysfunction  Continue oral Lasix -Hold metoprolol due to bradycardia -Hold losartan due to BP concerns  Metastatic breast cancer/fungating mass on R breast Follow-up with Dr. [name], added to treatment team as an FYI Wound care on board  Anemia of chronic disease History of CKD, metastatic breast cancer Hemoglobin stable at baseline  [place] [facility] consulted, patient is a full code  Anxiety and insomnia--- Atarax as needed, Remeron 15 mg qhs  Anorexia Likely related to chemo/underlying malignancy.   - Remeron 15 mg nightly     Estimated body mass index is 28.27 kg/m as calculated from the following:   Height as of this encounter: 5\' 5"  (1.651 m).   Weight as of this encounter: 77.1 kg.     Code Status: Full  Family Communication:   discussed case with patient's son [name], questions answered  Disposition -home with outpatient oncology and cardiology follow-up  Consultants:  Palliative care  Procedures:  11/25: Right-sided thoracentesis with removal of 300 mL of fluid  Antimicrobials:  Rocephin completed 12/30/2018  DVT prophylaxis: Continue apixaban   Discharge Condition: stable  Follow UP  Follow-up Information    [facility].   Specialty: [facility] Why: Resume RN, PT, and aide Contact information: [place] [address] [phone]        [name], MD. Go on 01/17/2019.   Specialty: Family Medicine Why: @2 :50 Contact information: [address] [place] [address]            Diet and Activity recommendation:  As advised  Discharge Instructions    Discharge Instructions    Call MD for:  difficulty breathing, headache or visual  disturbances   Complete by: As directed    Call MD for:  persistant dizziness or light-headedness   Complete by: As directed    Call MD for:  persistant nausea and vomiting   Complete by: As directed    Call MD for:  temperature >100.4   Complete by: As directed    Diet - low sodium heart healthy   Complete by: As directed    Discharge instructions   Complete by: As directed    1)Very low-salt diet advised 2)Weigh yourself daily, call if you gain more than 3 pounds in 1 day or more than 5 pounds in 1 week as your diuretic medications may need to be adjusted 3)Limit your Fluid  intake to no more than 60 ounces (1.8 Liters) per day 4) follow-up with your oncologist Dr. [name] as previously scheduled 5) you should call the cardiology office within the  next 5 days if you do not get a call back to schedule a follow-up appointment with your cardiologist 6) please STOP  metoprolol until you are seen and reevaluated by your cardiologist as outpatient,  7)please continue amiodarone 8) your left chest Pleurx catheter needs to be drained 3 times a week preferably on Tuesdays Thursdays and Saturdays 9) you are taking the blood thinner Eliquis/apixaban so please avoid Avoid ibuprofen/Advil/Aleve/Motrin/Goody Powders/Naproxen/BC powders/Meloxicam/Diclofenac/Indomethacin and other Nonsteroidal anti-inflammatory medications as these will make you more likely to bleed and can cause stomach ulcers, can also cause Kidney problems.   Increase activity slowly   Complete by: As directed         Discharge Medications     Allergies as of 12/31/2018   No Known Allergies     Medication List    STOP taking these medications   losartan 50 MG tablet Commonly known as: COZAAR   metoprolol tartrate 50 MG tablet Commonly known as: LOPRESSOR     TAKE these medications   acetaminophen 500 MG tablet Commonly known as: TYLENOL Take 1,000 mg by mouth every 6 (six) hours as needed.   ALPRAZolam 0.5 MG  tablet Commonly known as: XANAX Take 0.5-1 tablets (0.25-0.5 mg total) by mouth 2 (two) times daily as needed for anxiety or sleep. What changed: reasons to take this   amiodarone 200 MG tablet Commonly known as: PACERONE Take 1 tablet (200 mg total) by mouth daily.   apixaban 5 MG Tabs tablet Commonly known as: ELIQUIS Take 1 tablet (5 mg total) by mouth 2 (two) times daily.   ergocalciferol 1.25 MG (50000 UT) capsule Commonly known as: VITAMIN D2 Take 1 capsule (50,000 Units total) by mouth once a week.   furosemide 40 MG tablet Commonly known as: LASIX Take 1 tablet (40 mg total) by mouth daily.   hydrOXYzine 25 MG tablet Commonly known as: ATARAX/VISTARIL Take 1 tablet (25 mg total) by mouth 3 (three) times daily as needed for anxiety (for anxiety and insomnia/sleep).   letrozole 2.5 MG tablet Commonly known as: FEMARA Take 1 tablet (2.5 mg total) by mouth daily.   loperamide 2 MG capsule Commonly known as: IMODIUM Take 1 capsule (2 mg total) by mouth as needed for diarrhea or loose stools.   mirtazapine 15 MG tablet Commonly known as: REMERON Take 1 tablet (15 mg total) by mouth at bedtime.   ondansetron 4 MG disintegrating tablet Commonly known as: Zofran ODT Take 1 tablet (4 mg total) by mouth every 8 (eight) hours as needed for nausea or vomiting.       Major procedures and Radiology Reports - PLEASE review detailed and final reports for all details, in brief -    Dg Chest 1 View  Result Date: 12/25/2018 CLINICAL DATA:  Status post right-sided thoracentesis. EXAM: CHEST  1 VIEW COMPARISON:  Same day. FINDINGS: Stable cardiomediastinal silhouette. Left-sided chest tube is unchanged in position. No pneumothorax is noted. Right pleural effusion is slightly smaller. Stable left pleural effusion is noted. Increased left perihilar opacity is noted concerning for possible inflammation or atelectasis. Bony thorax is unremarkable. IMPRESSION: 1. Right-sided chest tube  is unchanged in position. Right pleural effusion is slightly smaller. Stable left pleural effusion. Increased left perihilar opacity is noted concerning for inflammation or atelectasis. 2. No pneumothorax seen. Electronically Signed   By: Marijo Conception M.D.   On: 12/25/2018 11:59   Dg Chest 2 View  Result Date: 12/26/2018 CLINICAL DATA:  History of metastatic breast carcinoma  and pleural effusions with prior tunneled PleurX drainage catheter placement on the left in October. Status post right-sided thoracentesis yesterday. EXAM: CHEST - 2 VIEW COMPARISON:  12/25/2018 FINDINGS: Stable heart size. Stable course left-sided PleurX catheter along the base of the hemithorax and ascending medially. Residual mild amount of partially loculated left pleural fluid laterally appears stable. Stable small right pleural effusion with right lower lung atelectasis/consolidation. No pneumothorax. IMPRESSION: Stable chest x-ray demonstrating no significant change in small right pleural effusion and right lower lung atelectasis/consolidation. Stable course of left-sided PleurX catheter with residual mild amount of partially loculated lateral left pleural fluid. Electronically Signed   By: Aletta Edouard M.D.   On: 12/26/2018 13:21   Dg Chest 2 View  Result Date: 12/25/2018 CLINICAL DATA:  History of community-acquired pneumonia. EXAM: CHEST - 2 VIEW COMPARISON:  12/14/2018 FINDINGS: Cardiac shadow is enlarged but stable. Aortic calcifications are seen. Left-sided PleurX catheter is again noted. Stable left pleural effusion is seen. Slight increase in right-sided effusion when compare with the prior study is noted. There is likely underlying atelectasis/infiltrate present. No bony abnormality is seen. IMPRESSION: Increasing right-sided pleural effusion when compare with the prior study. Electronically Signed   By: Inez Catalina M.D.   On: 12/25/2018 00:22   Dg Chest 2 View  Result Date: 12/14/2018 CLINICAL DATA:   Shortness of breath EXAM: CHEST - 2 VIEW COMPARISON:  11/19/2018 FINDINGS: Cardiomegaly. Unchanged small bilateral pleural effusions and associated atelectasis or consolidation with a left-sided tunneled pleural drainage catheter. No new airspace opacity. The visualized skeletal structures are unremarkable. IMPRESSION: 1. Unchanged small bilateral pleural effusions and associated atelectasis or consolidation with a left-sided tunneled pleural drainage catheter. No new airspace opacity. 2.  Cardiomegaly. Electronically Signed   By: Eddie Candle M.D.   On: 12/14/2018 14:28   Cta Chest For Pe  Result Date: 12/14/2018 CLINICAL DATA:  Dyspnea, recent hospitalization. History of adenocarcinoma and malignant effusion. EXAM: CT ANGIOGRAPHY CHEST WITH CONTRAST TECHNIQUE: Multidetector CT imaging of the chest was performed using the standard protocol during bolus administration of intravenous contrast. Multiplanar CT image reconstructions and MIPs were obtained to evaluate the vascular anatomy. CONTRAST:  51mL OMNIPAQUE IOHEXOL 350 MG/ML SOLN COMPARISON:  Multiple priors including CT from 10/16/2018 and chest x-rays from October and today. FINDINGS: Cardiovascular: Heart size is stable with signs of right heart enlargement and engorgement of central pulmonary vasculature. Main pulmonary artery at 3.5 cm. No signs of pulmonary embolism. Basilar vascular assessment limited by respiratory motion. Aorta is of normal caliber. Mediastinum/Nodes: No signs of adenopathy in the chest. Lungs/Pleura: Interval placement of a PleurX catheter in the left posterior chest. Loculated, known malignant effusion in the left chest with increased fissural component compared to previous study but reduced posterior component. Overall volume of fluid is similar compared to previous exam. Increased pleural fluid however in the right chest compared to the prior study. Patchy areas of nodularity in the right mid chest. No signs of pneumothorax.  Upper Abdomen: Incidental imaging of upper abdominal contents shows no acute abnormality. Musculoskeletal: Known fungating mass associated with the right breast is redemonstrated. Thickness and nodularity may be diminished compared to the prior study measuring approximately 1 cm in greatest thickness where there are areas that previously measured 1.4-1.5 cm. The significance is uncertain. There is an obvious soft tissue defect in this location. Multifocal areas of bony sclerosis and scattered areas of lytic change period. Sclerotic lesions scattered about the thoracic spine compatible with metastatic disease. Example of  lytic in subtly expansile lesions in the ribs best seen on sagittal image 24 of series 9 in the right lateral fourth rib. There also subacute fractures in inferior ribs along the right chest. Review of the MIP images confirms the above findings. IMPRESSION: 1. Patchy areas of nodularity in the right chest and some basilar consolidative changes on the right raising the question of superimposed pneumonia. 2. No pulmonary embolism identified. 3. Interval placement of a PleurX catheter in the left posterior chest. Overall volume of fluid in the left chest similar compared to the prior study. 4. Pleural fluid in the right chest compared to the prior study. 5. Known fungating mass associated with the right breast is redemonstrated. Thickness and nodularity may be slightly diminished. 6. Bony metastatic disease as above. Aortic Atherosclerosis (ICD10-I70.0). Electronically Signed   By: Zetta Bills M.D.   On: 12/14/2018 17:26   Dg Chest Port 1 View  Result Date: 12/28/2018 CLINICAL DATA:  Pleural effusions EXAM: PORTABLE CHEST 1 VIEW COMPARISON:  December 26, 2018 FINDINGS: PleurX catheter remains on the left, unchanged in position. There remains loculated pleural effusion on the left with a somewhat smaller pleural effusion on the right. There is atelectatic change in each lung base region. There  may be a degree of superimposed infiltrate in the left base and medial right base regions. No new opacity evident. Heart is mildly enlarged, stable, with pulmonary vascularity normal. There is aortic atherosclerosis. No pneumothorax. No bone lesions. IMPRESSION: Stable PleurX catheter positioning. Bilateral pleural effusions remain stable with atelectasis and potential superimposed infiltrate in the bases. No new opacity. Stable cardiac prominence. Aortic Atherosclerosis (ICD10-I70.0). Electronically Signed   By: Lowella Grip III M.D.   On: 12/28/2018 08:01   Ir Thoracentesis Asp Pleural Space W/img Guide  Result Date: 12/25/2018 INDICATION: Patient with history of metastatic breast cancer, recurrent left-sided effusion status post PleurX catheter placement. Chest x-ray shows new right pleural effusion. Request to IR for therapeutic thoracentesis. EXAM: ULTRASOUND GUIDED RIGHT THORACENTESIS MEDICATIONS: 10 mL 1% lidocaine COMPLICATIONS: None immediate. PROCEDURE: An ultrasound guided thoracentesis was thoroughly discussed with the patient and questions answered. The benefits, risks, alternatives and complications were also discussed. The patient understands and wishes to proceed with the procedure. Written consent was obtained. Ultrasound was performed to localize and mark an adequate pocket of fluid in the right chest. The area was then prepped and draped in the normal sterile fashion. 1% Lidocaine was used for local anesthesia. Under ultrasound guidance a 6 Fr Safe-T-Centesis catheter was introduced. Thoracentesis was performed. The catheter was removed and a dressing applied. FINDINGS: A total of approximately 300 mL of golden fluid was removed. IMPRESSION: Successful ultrasound guided right thoracentesis yielding 300 mL of pleural fluid. Read by Candiss Norse, PA-C Electronically Signed   By: Aletta Edouard M.D.   On: 12/25/2018 11:58   Micro Results   Recent Results (from the past 240  hour(s))  Urine Culture     Status: Abnormal   Collection Time: 12/25/18  1:11 AM   Specimen: Urine, Random  Result Value Ref Range Status   Specimen Description URINE, RANDOM  Final   Special Requests   Final    NONE Performed at [facility], [address], [place], [place] [address]    Culture MULTIPLE SPECIES PRESENT, SUGGEST RECOLLECTION (A)  Final   Report Status 12/26/2018 FINAL  Final  SARS CORONAVIRUS 2 (TAT 6-24 HRS) Nasopharyngeal Nasopharyngeal Swab     Status: None   Collection  Time: 12/25/18  2:19 AM   Specimen: Nasopharyngeal Swab  Result Value Ref Range Status   SARS Coronavirus 2 NEGATIVE NEGATIVE Final    Comment: (NOTE) SARS-CoV-2 target nucleic acids are NOT DETECTED. The SARS-CoV-2 RNA is generally detectable in upper and lower respiratory specimens during the acute phase of infection. Negative results do not preclude SARS-CoV-2 infection, do not rule out co-infections with other pathogens, and should not be used as the sole basis for treatment or other patient management decisions. Negative results must be combined with clinical observations, patient history, and epidemiological information. The expected result is Negative. Fact Sheet for Patients: SugarRoll.be Fact Sheet for Healthcare Providers: https://www.woods-mathews.com/ This test is not yet approved or cleared by the [place] FDA and  has been authorized for detection and/or diagnosis of SARS-CoV-2 by FDA under an Emergency Use Authorization (EUA). This EUA will remain  in effect (meaning this test can be used) for the duration of the COVID-19 declaration under Section 56 4(b)(1) of the Act, 21 U.S.C. section 360bbb-3(b)(1), unless the authorization is terminated or revoked sooner. Performed at [facility], [place] [address], [place], [place] [address]        Today   Subjective    Lateisha Deitz today has no new complaints    -Ambulating around without dizziness or significant increased dyspnea -No chest pains        Patient has been seen and examined prior to discharge   Objective   Blood pressure (!) 91/59, pulse 87, temperature 99.5 F (37.5 C), temperature source Oral, resp. rate 17, height 5\' 5"  (1.651 m), weight 77.1 kg, SpO2 100 %.   Intake/Output Summary (Last 24 hours) at 12/31/2018 1823 Last data filed at 12/31/2018 1400 Gross per 24 hour  Intake 550 ml  Output 960 ml  Net -410 ml    Exam Gen:- Awake Alert,  In no apparent distress,   HEENT:- [place].AT, No sclera icterus Nose- [place] 2 L/min Neck-Supple Neck,No JVD,.  Breast--- right breast with dressing on Lungs-diminished breath sounds, left more than right, no wheezing, left-sided Pleurx catheter in situ CV- S1, S2 normal, Abd-  +ve B.Sounds, Abd Soft, No tenderness,    Extremity/Skin:-Trace  pitting edema, TED stockings psych-affect is  appropriate, oriented x3 Neuro-generalized weakness without new focal deficits no new focal deficits, no tremors   Data Review   CBC w Diff:  Lab Results  Component Value Date   WBC 5.0 12/31/2018   HGB 9.8 (L) 12/31/2018   HGB 10.1 (L) 11/13/2018   HCT 33.1 (L) 12/31/2018   PLT 295 12/31/2018   PLT 317 11/13/2018   LYMPHOPCT 24 12/28/2018   MONOPCT 12 12/28/2018   EOSPCT 2 12/28/2018   BASOPCT 1 12/28/2018    CMP:  Lab Results  Component Value Date   NA 142 12/31/2018   K 3.2 (L) 12/31/2018   CL 97 (L) 12/31/2018   CO2 30 12/31/2018   BUN 6 (L) 12/31/2018   CREATININE 0.84 12/31/2018   CREATININE 1.28 (H) 11/13/2018   PROT 5.6 (L) 12/21/2018   ALBUMIN 2.6 (L) 12/21/2018   BILITOT 0.5 12/21/2018   BILITOT 0.3 11/13/2018   ALKPHOS 75 12/21/2018   AST 14 (L) 12/21/2018   AST 18 11/13/2018   ALT 12 12/21/2018   ALT 16 11/13/2018  . Total Discharge time is about 33 minutes  Roxan Hockey M.D on 12/31/2018 at 6:23 PM  Go to [email or web address] -  for contact info  [facility] -  Office  [phone] ° ° °

## 2018-12-31 NOTE — Progress Notes (Signed)
Patient had 369ml foamy clear serous fluid drained from her pleurx catheter via a sterile procedure.  Patient tolerated procedure well. Marcille Blanco, RN

## 2019-01-01 ENCOUNTER — Other Ambulatory Visit: Payer: Self-pay | Admitting: *Deleted

## 2019-01-01 DIAGNOSIS — C7951 Secondary malignant neoplasm of bone: Secondary | ICD-10-CM

## 2019-01-01 DIAGNOSIS — C50919 Malignant neoplasm of unspecified site of unspecified female breast: Secondary | ICD-10-CM

## 2019-01-01 NOTE — Telephone Encounter (Signed)
Dr. [name] asked for patient to be scheduled 12/3 at 2:40PM with lab prior and injection following MD appt. Schedule message sent.  Contacted son [name] with appt times, he verbalized understanding.

## 2019-01-02 ENCOUNTER — Inpatient Hospital Stay: Payer: Medicare HMO | Attending: Hematology | Admitting: Hematology

## 2019-01-02 ENCOUNTER — Inpatient Hospital Stay: Payer: Medicare HMO

## 2019-01-02 ENCOUNTER — Other Ambulatory Visit: Payer: Self-pay

## 2019-01-02 VITALS — BP 181/69 | HR 48 | Temp 98.5°F | Resp 18 | Wt 170.6 lb

## 2019-01-02 DIAGNOSIS — I509 Heart failure, unspecified: Secondary | ICD-10-CM | POA: Diagnosis not present

## 2019-01-02 DIAGNOSIS — Z7901 Long term (current) use of anticoagulants: Secondary | ICD-10-CM | POA: Diagnosis not present

## 2019-01-02 DIAGNOSIS — C7951 Secondary malignant neoplasm of bone: Secondary | ICD-10-CM

## 2019-01-02 DIAGNOSIS — N189 Chronic kidney disease, unspecified: Secondary | ICD-10-CM | POA: Diagnosis not present

## 2019-01-02 DIAGNOSIS — J910 Malignant pleural effusion: Secondary | ICD-10-CM | POA: Insufficient documentation

## 2019-01-02 DIAGNOSIS — I129 Hypertensive chronic kidney disease with stage 1 through stage 4 chronic kidney disease, or unspecified chronic kidney disease: Secondary | ICD-10-CM | POA: Diagnosis not present

## 2019-01-02 DIAGNOSIS — C50919 Malignant neoplasm of unspecified site of unspecified female breast: Secondary | ICD-10-CM | POA: Diagnosis not present

## 2019-01-02 DIAGNOSIS — C50911 Malignant neoplasm of unspecified site of right female breast: Secondary | ICD-10-CM | POA: Insufficient documentation

## 2019-01-02 DIAGNOSIS — R5383 Other fatigue: Secondary | ICD-10-CM | POA: Insufficient documentation

## 2019-01-02 DIAGNOSIS — Z170 Estrogen receptor positive status [ER+]: Secondary | ICD-10-CM | POA: Diagnosis not present

## 2019-01-02 DIAGNOSIS — Z9981 Dependence on supplemental oxygen: Secondary | ICD-10-CM | POA: Diagnosis not present

## 2019-01-02 DIAGNOSIS — R630 Anorexia: Secondary | ICD-10-CM | POA: Insufficient documentation

## 2019-01-02 DIAGNOSIS — I4891 Unspecified atrial fibrillation: Secondary | ICD-10-CM | POA: Insufficient documentation

## 2019-01-02 LAB — CMP (CANCER CENTER ONLY)
ALT: 28 U/L (ref 0–44)
AST: 26 U/L (ref 15–41)
Albumin: 3.6 g/dL (ref 3.5–5.0)
Alkaline Phosphatase: 100 U/L (ref 38–126)
Anion gap: 13 (ref 5–15)
BUN: 11 mg/dL (ref 8–23)
CO2: 31 mmol/L (ref 22–32)
Calcium: 8.6 mg/dL — ABNORMAL LOW (ref 8.9–10.3)
Chloride: 96 mmol/L — ABNORMAL LOW (ref 98–111)
Creatinine: 1.08 mg/dL — ABNORMAL HIGH (ref 0.44–1.00)
GFR, Est AFR Am: 57 mL/min — ABNORMAL LOW (ref >60)
GFR, Estimated: 49 mL/min — ABNORMAL LOW (ref >60)
Glucose, Bld: 114 mg/dL — ABNORMAL HIGH (ref 70–99)
Potassium: 3.9 mmol/L (ref 3.5–5.1)
Sodium: 140 mmol/L (ref 135–145)
Total Bilirubin: 1.1 mg/dL (ref 0.3–1.2)
Total Protein: 6.8 g/dL (ref 6.5–8.1)

## 2019-01-02 LAB — CBC WITH DIFFERENTIAL (CANCER CENTER ONLY)
Abs Immature Granulocytes: 0.02 10*3/uL (ref 0.00–0.07)
Basophils Absolute: 0.1 10*3/uL (ref 0.0–0.1)
Basophils Relative: 1 %
Eosinophils Absolute: 0.1 10*3/uL (ref 0.0–0.5)
Eosinophils Relative: 1 %
HCT: 35.4 % — ABNORMAL LOW (ref 36.0–46.0)
Hemoglobin: 10.6 g/dL — ABNORMAL LOW (ref 12.0–15.0)
Immature Granulocytes: 0 %
Lymphocytes Relative: 15 %
Lymphs Abs: 1 10*3/uL (ref 0.7–4.0)
MCH: 28.2 pg (ref 26.0–34.0)
MCHC: 29.9 g/dL — ABNORMAL LOW (ref 30.0–36.0)
MCV: 94.1 fL (ref 80.0–100.0)
Monocytes Absolute: 0.6 10*3/uL (ref 0.1–1.0)
Monocytes Relative: 8 %
Neutro Abs: 5.1 10*3/uL (ref 1.7–7.7)
Neutrophils Relative %: 75 %
Platelet Count: 319 10*3/uL (ref 150–400)
RBC: 3.76 MIL/uL — ABNORMAL LOW (ref 3.87–5.11)
RDW: 15.7 % — ABNORMAL HIGH (ref 11.5–15.5)
WBC Count: 6.9 10*3/uL (ref 4.0–10.5)
nRBC: 0 % (ref 0.0–0.2)

## 2019-01-02 NOTE — Progress Notes (Signed)
HEMATOLOGY/ONCOLOGY CONSULTATION NOTE  Date of Service: 01/02/2019  Patient Care Team: [name], MD as PCP - General (Family Medicine) Dorothy Spark, MD as PCP - Cardiology (Cardiology)  CHIEF COMPLAINTS/PURPOSE OF CONSULTATION:  Breast Cancer  HISTORY OF PRESENTING ILLNESS:   Beverly Tucker is a wonderful 78 y.o. female with past medical history of hypertension who presents with a 3-week history of progressive worsening of shortness of breath.  Patient reports that symptoms started with a runny nose and cough productive of clear white mucus.  Patient saw outpatient provider, was COVID negative, was started on allergy medication.  Shortness of breath continue to progress, saw provider on 9/12, was diagnosed with pneumonia, and started on Keflex.  At that visit, patient was also started on doxycycline for cellulitis (under right breast).  Patient reports taking both medications consistently every day.  Patient reports that shortness of breath is worse with exertion, worse when lying flat, reports sleeping with 2 pillows, denies PND symptoms.  Patient denies fever, chills, muscle aches, weight loss/gain, abdominal pain, urinary symptoms, diarrhea.  Reports 1 episode of nonbilious, nonbloody vomit last Saturday.  Patient reports lower extremity swelling over the past 2 days.  States last colonoscopy was 3 to 4 years ago, not sure when she is supposed to follow-up again.  Patient reports never having a mammogram.  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 Patient reports lesion under right breast which is non-tender and has been developing for the past couple of months. She has been taking doxycycline as this was thought to be cellulitis.  PCP is Kathryne Eriksson with [facility].   INTERVAL HISTORY:   Beverly Tucker is a wonderful 78 y.o. female who is here for  evaluation and management of breast cancer. We are joined by her granddaughter. The patient's last visit with [place] was on 12/03/2018. The pt reports that she is doing well overall but she feel extremely weak.  The pt reports she was in the hospital for generalized weakness and swelling on 12/24/2018-12/31/2018.    She states that she feels weak. She is draining her pleurex catheter 3 times a week.  She is using oxygen continuously.  She is concerned about her blood pressure and about her loss of appetite.    Of note since the patient's last visit, pt has had DG Chest [facility] (Accession [id number]) completed on 12/28/2018 with results revealing "Stable PleurX catheter positioning. Bilateral pleural effusions remain stable with atelectasis and potential superimposed infiltrate in the bases. No new opacity. Stable cardiac prominence. Aortic Atherosclerosis (ICD10-I70.0)."  Of note since the patient's last visit, pt has had DG Chest 2 View (Accession [id number]) completed on 12/26/2018 with results revealing "Stable chest x-ray demonstrating no significant change in small right pleural effusion and right lower lung atelectasis/consolidation. Stable course of left-sided PleurX catheter with residual mild amount of partially loculated lateral left pleural fluid."  Of note since the patient's last visit, pt has had DG Chest 1 View (Accession [id number]) completed on 12/25/2018 with results revealing "1. Right-sided chest tube is unchanged in position. Right pleural effusion is slightly smaller. Stable left pleural effusion. Increased left perihilar opacity is noted concerning for inflammation or atelectasis. 2. No pneumothorax seen."  Of note since the patient's last visit, pt has had DG Chest 2 View (Accession [id number]) completed on 12/25/2018 with results revealing "Increasing right-sided pleural effusion  when compare with the prior study."  Lab results today (01/02/19) of CBC w/diff and CMP is  as follows: all values are WNL except RBC at 3.76, Hemoglobin at 10.6, HCT at 35.4, MCHC at 29.9, RDW at 15.7, PENDING CMP and clot specimen.  On review of systems, pt reports leg swelling, fatigue, anxiety, less nausea, cough and denies breast pain, dysuria and any other symptoms.   MEDICAL HISTORY:  Past Medical History:  Diagnosis Date   Anemia    Cancer ([place])    Breast    Chronic diastolic CHF (congestive heart failure) (HCC)    CKD (chronic kidney disease), stage III    GERD (gastroesophageal reflux disease)    Hypertension    Malignant pleural effusion    Metastatic breast cancer (HCC)    PAF (paroxysmal atrial fibrillation) (HCC)    PSVT (paroxysmal supraventricular tachycardia) ([place])     SURGICAL HISTORY: Past Surgical History:  Procedure Laterality Date   CHOLECYSTECTOMY     IR PERC PLEURAL DRAIN W/INDWELL CATH W/IMG GUIDE  11/08/2018   IR THORACENTESIS ASP PLEURAL SPACE W/IMG GUIDE  10/22/2018   IR THORACENTESIS ASP PLEURAL SPACE W/IMG GUIDE  12/25/2018    SOCIAL HISTORY: Social History   Socioeconomic History   Marital status: Widowed    Spouse name: Not on file   Number of children: Not on file   Years of education: Not on file   Highest education level: Not on file  Occupational History   Not on file  Social Needs   Financial resource strain: Not on file   Food insecurity    Worry: Not on file    Inability: Not on file   Transportation needs    Medical: Not on file    Non-medical: Not on file  Tobacco Use   Smoking status: Never Smoker   Smokeless tobacco: Never Used  Substance and Sexual Activity   Alcohol use: Never    Frequency: Never   Drug use: Never   Sexual activity: Not on file  Lifestyle   Physical activity    Days per week: Not on file    Minutes per session: Not on file   Stress: Not on file  Relationships   Social connections    Talks on phone: Not on file    Gets together: Not on file    Attends  religious service: Not on file    Active member of club or organization: Not on file    Attends meetings of clubs or organizations: Not on file    Relationship status: Not on file   Intimate partner violence    Fear of current or ex partner: Not on file    Emotionally abused: Not on file    Physically abused: Not on file    Forced sexual activity: Not on file  Other Topics Concern   Not on file  Social History Narrative   Not on file    FAMILY HISTORY: Family History  Problem Relation Age of Onset   Diabetes Mellitus II Other        son    ALLERGIES:  has No Known Allergies.  MEDICATIONS:  Current Outpatient Medications  Medication Sig Dispense Refill   acetaminophen (TYLENOL) 500 MG tablet Take 1,000 mg by mouth every 6 (six) hours as needed.     ALPRAZolam (XANAX) 0.5 MG tablet Take 0.5-1 tablets (0.25-0.5 mg total) by mouth 2 (two) times daily as needed for anxiety or sleep. 15 tablet 0   amiodarone (  PACERONE) 200 MG tablet Take 1 tablet (200 mg total) by mouth daily. 30 tablet 0   apixaban (ELIQUIS) 5 MG TABS tablet Take 1 tablet (5 mg total) by mouth 2 (two) times daily. 60 tablet 1   ergocalciferol (VITAMIN D2) 1.25 MG (50000 UT) capsule Take 1 capsule (50,000 Units total) by mouth once a week. 12 capsule 0   furosemide (LASIX) 40 MG tablet Take 1 tablet (40 mg total) by mouth daily. 30 tablet 3   hydrOXYzine (ATARAX/VISTARIL) 25 MG tablet Take 1 tablet (25 mg total) by mouth 3 (three) times daily as needed for anxiety (for anxiety and insomnia/sleep). 30 tablet 2   letrozole (FEMARA) 2.5 MG tablet Take 1 tablet (2.5 mg total) by mouth daily. 30 tablet 0   loperamide (IMODIUM) 2 MG capsule Take 1 capsule (2 mg total) by mouth as needed for diarrhea or loose stools. 30 capsule 0   mirtazapine (REMERON) 15 MG tablet Take 1 tablet (15 mg total) by mouth at bedtime. 30 tablet 3   ondansetron (ZOFRAN ODT) 4 MG disintegrating tablet Take 1 tablet (4 mg total) by  mouth every 8 (eight) hours as needed for nausea or vomiting. 20 tablet 0   No current facility-administered medications for this visit.     REVIEW OF SYSTEMS:   A 10+ POINT REVIEW OF SYSTEMS WAS OBTAINED including neurology, dermatology, psychiatry, cardiac, respiratory, lymph, extremities, GI, GU, Musculoskeletal, constitutional, breasts, reproductive, HEENT.  All pertinent positives are noted in the HPI.  All others are negative.    PHYSICAL EXAMINATION: ECOG FS:2 - Symptomatic, <50% confined to bed  Vitals:   01/02/19 1445  BP: (!) 181/69  Pulse: (!) 48  Resp: 18  Temp: 98.5 F (36.9 C)  SpO2: 100%   Wt Readings from Last 3 Encounters:  01/02/19 170 lb 9 oz (77.4 kg)  12/31/18 169 lb 14.4 oz (77.1 kg)  12/21/18 176 lb 8 oz (80.1 kg)   Body mass index is 28.38 kg/m.    GENERAL:alert, in no acute distress and comfortable SKIN: no acute rashes, no significant lesions EYES: conjunctiva are pink and non-injected, sclera anicteric OROPHARYNX: MMM, no exudates, no oropharyngeal erythema or ulceration NECK: supple, no JVD LYMPH:  no palpable lymphadenopathy in the cervical, axillary or inguinal regions LUNGS: clear to auscultation b/l with normal respiratory effort HEART: regular rate & rhythm ABDOMEN:  normoactive bowel sounds , non tender, not distended. Extremity: no pedal edema PSYCH: alert & oriented x 3 with fluent speech NEURO: no focal motor/sensory deficits  LABORATORY DATA:  I have reviewed the data as listed  . CBC Latest Ref Rng & Units 01/02/2019 12/31/2018 12/28/2018  WBC 4.0 - 10.5 K/uL 6.9 5.0 4.6  Hemoglobin 12.0 - 15.0 g/dL 10.6(L) 9.8(L) 9.3(L)  Hematocrit 36.0 - 46.0 % 35.4(L) 33.1(L) 30.9(L)  Platelets 150 - 400 K/uL 319 295 306    . CMP Latest Ref Rng & Units 12/31/2018 12/30/2018 12/28/2018  Glucose 70 - 99 mg/dL 105(H) 106(H) 105(H)  BUN 8 - 23 mg/dL 6(L) 7(L) 10  Creatinine 0.44 - 1.00 mg/dL 0.84 0.93 0.91  Sodium 135 - 145 mmol/L 142 140  141  Potassium 3.5 - 5.1 mmol/L 3.2(L) 3.6 3.8  Chloride 98 - 111 mmol/L 97(L) 99 99  CO2 22 - 32 mmol/L 30 34(H) 34(H)  Calcium 8.9 - 10.3 mg/dL 8.5(L) 7.9(L) 7.7(L)  Total Protein 6.5 - 8.1 g/dL - - -  Total Bilirubin 0.3 - 1.2 mg/dL - - -  Alkaline Phos  38 - 126 U/L - - -  AST 15 - 41 U/L - - -  ALT 0 - 44 U/L - - -  12/28/2018 DG Chest Port 1 View (Accession [id number])      12/26/2018 DG Chest 2 View (Accession [id number])  12/25/2018 DG Chest 1 View (Accession [id number])  12/25/2018 DG Chest 2 View (Accession [id number])  10/16/2018 Surgical Pathology    10/16/2018 Surgical Pathology    RADIOGRAPHIC STUDIES: I have personally reviewed the radiological images as listed and agreed with the findings in the report. Dg Chest 1 View  Result Date: 12/25/2018 CLINICAL DATA:  Status post right-sided thoracentesis. EXAM: CHEST  1 VIEW COMPARISON:  Same day. FINDINGS: Stable cardiomediastinal silhouette. Left-sided chest tube is unchanged in position. No pneumothorax is noted. Right pleural effusion is slightly smaller. Stable left pleural effusion is noted. Increased left perihilar opacity is noted concerning for possible inflammation or atelectasis. Bony thorax is unremarkable. IMPRESSION: 1. Right-sided chest tube is unchanged in position. Right pleural effusion is slightly smaller. Stable left pleural effusion. Increased left perihilar opacity is noted concerning for inflammation or atelectasis. 2. No pneumothorax seen. Electronically Signed   By: Marijo Conception M.D.   On: 12/25/2018 11:59   Dg Chest 2 View  Result Date: 12/26/2018 CLINICAL DATA:  History of metastatic breast carcinoma and pleural effusions with prior tunneled PleurX drainage catheter placement on the left in October. Status post right-sided thoracentesis yesterday. EXAM: CHEST - 2 VIEW COMPARISON:  12/25/2018 FINDINGS: Stable heart size. Stable course left-sided PleurX catheter along the base of the hemithorax  and ascending medially. Residual mild amount of partially loculated left pleural fluid laterally appears stable. Stable small right pleural effusion with right lower lung atelectasis/consolidation. No pneumothorax. IMPRESSION: Stable chest x-ray demonstrating no significant change in small right pleural effusion and right lower lung atelectasis/consolidation. Stable course of left-sided PleurX catheter with residual mild amount of partially loculated lateral left pleural fluid. Electronically Signed   By: Aletta Edouard M.D.   On: 12/26/2018 13:21   Dg Chest 2 View  Result Date: 12/25/2018 CLINICAL DATA:  History of community-acquired pneumonia. EXAM: CHEST - 2 VIEW COMPARISON:  12/14/2018 FINDINGS: Cardiac shadow is enlarged but stable. Aortic calcifications are seen. Left-sided PleurX catheter is again noted. Stable left pleural effusion is seen. Slight increase in right-sided effusion when compare with the prior study is noted. There is likely underlying atelectasis/infiltrate present. No bony abnormality is seen. IMPRESSION: Increasing right-sided pleural effusion when compare with the prior study. Electronically Signed   By: Inez Catalina M.D.   On: 12/25/2018 00:22   Dg Chest 2 View  Result Date: 12/14/2018 CLINICAL DATA:  Shortness of breath EXAM: CHEST - 2 VIEW COMPARISON:  11/19/2018 FINDINGS: Cardiomegaly. Unchanged small bilateral pleural effusions and associated atelectasis or consolidation with a left-sided tunneled pleural drainage catheter. No new airspace opacity. The visualized skeletal structures are unremarkable. IMPRESSION: 1. Unchanged small bilateral pleural effusions and associated atelectasis or consolidation with a left-sided tunneled pleural drainage catheter. No new airspace opacity. 2.  Cardiomegaly. Electronically Signed   By: Eddie Candle M.D.   On: 12/14/2018 14:28   Cta Chest For Pe  Result Date: 12/14/2018 CLINICAL DATA:  Dyspnea, recent hospitalization. History of  adenocarcinoma and malignant effusion. EXAM: CT ANGIOGRAPHY CHEST WITH CONTRAST TECHNIQUE: Multidetector CT imaging of the chest was performed using the standard protocol during bolus administration of intravenous contrast. Multiplanar CT image reconstructions and MIPs were obtained to evaluate the vascular anatomy. CONTRAST:  8m OMNIPAQUE IOHEXOL 350 MG/ML SOLN COMPARISON:  Multiple priors including CT from 10/16/2018 and chest x-rays from October and today. FINDINGS: Cardiovascular: Heart size is stable with signs of right heart enlargement and engorgement of central pulmonary vasculature. Main pulmonary artery at 3.5 cm. No signs of pulmonary embolism. Basilar vascular assessment limited by respiratory motion. Aorta is of normal caliber. Mediastinum/Nodes: No signs of adenopathy in the chest. Lungs/Pleura: Interval placement of a PleurX catheter in the left posterior chest. Loculated, known malignant effusion in the left chest with increased fissural component compared to previous study but reduced posterior component. Overall volume of fluid is similar compared to previous exam. Increased pleural fluid however in the right chest compared to the prior study. Patchy areas of nodularity in the right mid chest. No signs of pneumothorax. Upper Abdomen: Incidental imaging of upper abdominal contents shows no acute abnormality. Musculoskeletal: Known fungating mass associated with the right breast is redemonstrated. Thickness and nodularity may be diminished compared to the prior study measuring approximately 1 cm in greatest thickness where there are areas that previously measured 1.4-1.5 cm. The significance is uncertain. There is an obvious soft tissue defect in this location. Multifocal areas of bony sclerosis and scattered areas of lytic change period. Sclerotic lesions scattered about the thoracic spine compatible with metastatic disease. Example of lytic in subtly expansile lesions in the ribs best seen on  sagittal image 24 of series 9 in the right lateral fourth rib. There also subacute fractures in inferior ribs along the right chest. Review of the MIP images confirms the above findings. IMPRESSION: 1. Patchy areas of nodularity in the right chest and some basilar consolidative changes on the right raising the question of superimposed pneumonia. 2. No pulmonary embolism identified. 3. Interval placement of a PleurX catheter in the left posterior chest. Overall volume of fluid in the left chest similar compared to the prior study. 4. Pleural fluid in the right chest compared to the prior study. 5. Known fungating mass associated with the right breast is redemonstrated. Thickness and nodularity may be slightly diminished. 6. Bony metastatic disease as above. Aortic Atherosclerosis (ICD10-I70.0). Electronically Signed   By: GZetta BillsM.D.   On: 12/14/2018 17:26   Dg Chest Port 1 View  Result Date: 12/28/2018 CLINICAL DATA:  Pleural effusions EXAM: PORTABLE CHEST 1 VIEW COMPARISON:  December 26, 2018 FINDINGS: PleurX catheter remains on the left, unchanged in position. There remains loculated pleural effusion on the left with a somewhat smaller pleural effusion on the right. There is atelectatic change in each lung base region. There may be a degree of superimposed infiltrate in the left base and medial right base regions. No new opacity evident. Heart is mildly enlarged, stable, with pulmonary vascularity normal. There is aortic atherosclerosis. No pneumothorax. No bone lesions. IMPRESSION: Stable PleurX catheter positioning. Bilateral pleural effusions remain stable with atelectasis and potential superimposed infiltrate in the bases. No new opacity. Stable cardiac prominence. Aortic Atherosclerosis (ICD10-I70.0). Electronically Signed   By: WLowella GripIII M.D.   On: 12/28/2018 08:01   Ir Thoracentesis Asp Pleural Space W/img Guide  Result Date: 12/25/2018 INDICATION: Patient with history of  metastatic breast cancer, recurrent left-sided effusion status post PleurX catheter placement. Chest x-ray shows new right pleural effusion. Request to IR for therapeutic thoracentesis. EXAM: ULTRASOUND GUIDED RIGHT THORACENTESIS MEDICATIONS: 10 mL 1% lidocaine COMPLICATIONS: None immediate. PROCEDURE: An ultrasound guided thoracentesis was thoroughly discussed with the patient and questions answered. The benefits, risks, alternatives and complications were  also discussed. The patient understands and wishes to proceed with the procedure. Written consent was obtained. Ultrasound was performed to localize and mark an adequate pocket of fluid in the right chest. The area was then prepped and draped in the normal sterile fashion. 1% Lidocaine was used for local anesthesia. Under ultrasound guidance a 6 Fr Safe-T-Centesis catheter was introduced. Thoracentesis was performed. The catheter was removed and a dressing applied. FINDINGS: A total of approximately 300 mL of golden fluid was removed. IMPRESSION: Successful ultrasound guided right thoracentesis yielding 300 mL of pleural fluid. Read by Candiss Norse, PA-C Electronically Signed   By: Aletta Edouard M.D.   On: 12/25/2018 11:58    ASSESSMENT & PLAN:   #1 Rt breast metastastic cancer ERE/PR+, Her2 neg  10/14/2018 CT Chest ([id number])  revealed "1. No convincing pneumonia. 2. Moderate left and small right pleural effusions. There is mild bilateral interstitial thickening most evident in the lower lungs. This suggests mild interstitial edema. 3. Dependent atelectasis in the lower lobes, left greater than right, with milder atelectasis in the left upper lobe. 4. Small lung nodules most evident in the right middle lobe. 5. Abnormal soft tissue contiguous with the skin of the medial right breast. Although this may be infectious/inflammatory, neoplastic disease should be considered. Consider follow-up diagnostic mammography and possible right breast ultrasound  for further assessment."  10/16/2018 CT Angio Chest ([id number])  revealed "1. Technically adequate exam showing no acute pulmonary embolus. 2. Small LEFT pneumothorax, estimated to be 5-10% of lung volume. 3. Small to moderate LEFT pleural effusion and associated Atelectasis. 4. Trace RIGHT pleural effusion and basilar atelectasis. 5. Cardiomegaly and coronary artery disease. 6. RIGHT breast skin thickening and irregularity suspicious for malignancy. Recommend diagnostic mammography and ultrasound when the patient is able. 7. Small bilateral axillary lymph nodes, 1 centimeter in diameter. 8. Aortic Atherosclerosis (ICD10-I70.0)."  10/16/2018 Surgical Pathology of right breast excision reveals "Metastatic Carcinoma. Tumor cells are NEGATIVE for Her2".  10/21/2018 MRI Brain ([id number]) revealed "Negative MRI brain with contrast. Negative for metastatic disease."  10/21/2018 CT Abd/Pel ([id number]) revealed "1. Large malignant left-sided effusion. Trace effusion on the right with basilar atelectasis. 2. Variation in parenchymal enhancement raising the question of hilar lesion in the left chest, consider CT chest if not performed for further evaluation. 3. 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 4. Endometrial thickening may represent a second primary, consider endovaginal sonogram for further assessment as warranted. 5. Question of added enhancement in these central canal along the spinal cord internal nerve roots. This could be seen in the setting of CNS involvement but is not certain. MRI of the spine for additional staging could be performed as warranted. 6. Area of vague hypoattenuation anterior and inferior to portal structures in medial section left hepatic lobe likely an area of fatty infiltration, attention on follow-up or assess with MR for definitive characterization."  #2 Malignant  Pleural effusion moderate left and small right-sided pleural effusion with shortness of breath. 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  Pneumothorax status post thoracentesis  #3 Congestive heart failure #4 Atrial fibrillation with RVR on therapeutic anticoagulation #5 CKD #6 hypertension #7 coag negative staph on blood cultures thought to be contaminant  PLAN:  -Discussed pt labwork today, 12/31/18;all values are WNL except RBC at 3.76, Hemoglobin at 10.6, HCT at 35.4, MCHC at 29.9, RDW at 15.7,  -Suggested to  keep taking letrozole but to stop Verzenio. Once she is doing well we will could consider adding back her CDK 4/6 inhibitor to her cancer treatments.  -Advised that aggressive treatment is not recommended given current health concerns and age.  -Discussed to continue to follow up with cardiologist for heart concerns. -Discussed to limit sodium intake. Pt likes Gatorade advised to try to replace Gatorade with water. -Discussed the oxygen usage. Advised pt to continue to monitor oxygen levels  -Discussed BP at 181/69. Will retake BP before checking out.  -Discussed that pts heart rate is low today at 48. -Recommended obtaining a blood pressure monitor. Advised to get blood pressure readings in the morning before any activities.  -Discussed pts anxiety concerns. Advised pt that anxiety can cause bp to increase and cause difficulty breathing. -Discussed the wound on pts breast is improving -Discussed 01/02/19 Hemoglobin at 10.6 -Discussed 01/02/19 WBC is normal -Discussed that pt does not need to take marinol.   FOLLOW UP: RTC with Dr [name] with labs in 1 month   The total time spent in the appt was 35 minutes and more than 50% was on counseling and direct patient cares.  All of the patient's questions were answered with apparent satisfaction. The patient knows to call the clinic with any problems, questions or  concerns.    Sullivan Lone MD MS AAHIVMS [facility] Hematology/Oncology Physician [facility]  (Office):       [phone] (Work cell):  [phone] (Fax):           [phone]  01/02/2019 12:49 PM  I, Scot Dock, am acting as a scribe for Dr. Sullivan Lone.   .I have reviewed the above documentation for accuracy and completeness, and I agree with the above. Brunetta Genera MD

## 2019-01-03 ENCOUNTER — Telehealth: Payer: Self-pay | Admitting: Hematology

## 2019-01-03 NOTE — Telephone Encounter (Signed)
Scheduled per los. Called and left msg. Mailed printout  °

## 2019-01-09 ENCOUNTER — Telehealth: Payer: Self-pay | Admitting: Cardiology

## 2019-01-09 ENCOUNTER — Encounter: Payer: Self-pay | Admitting: Physician Assistant

## 2019-01-09 NOTE — Telephone Encounter (Signed)
New Message  Shareej (Nurse with [name]) is calling in to report weight gain and swelling for patient.    Pt c/o swelling: STAT is pt has developed SOB within 24 hours  1) How much weight have you gained and in what time span? 4 lbs in less than a week (168 to 172)  2) If swelling, where is the swelling located? Legs, feet, and ankle  3) Are you currently taking a fluid pill? Yes  4) Are you currently SOB? Yes on exertion  5) Do you have a log of your daily weights (if so, list)? 168, 172,   6) Have you gained 3 pounds in a day or 5 pounds in a week? No  7) Have you traveled recently? No   Nurse states that patient needs some education on fluid restriction, watching sodium intake, and weighing everyday. Nurse states that patient is not doing these things and wants to see if she will get a better response from the nurses in office.   [place] Nurse [phone]

## 2019-01-09 NOTE — Telephone Encounter (Signed)
Noted, will revisit with patient at [place]. Oncology has also relayed this information to her as well but it remains a difficult situation based on her widely metastatic cancer and quality of life issues. Thanks - [employer]

## 2019-01-09 NOTE — Progress Notes (Signed)
Virtual Visit via Telephone Note   This visit type was conducted due to national recommendations for restrictions regarding the COVID-19 Pandemic (e.g. social distancing) in an effort to limit this patient's exposure and mitigate transmission in our community.  Due to her co-morbid illnesses, this patient is at least at moderate risk for complications without adequate follow up.  This format is felt to be most appropriate for this patient at this time.  The patient did not have access to video technology/had technical difficulties with video requiring transitioning to audio format only (telephone).  All issues noted in this document were discussed and addressed.  No physical exam could be performed with this format.  Please refer to the patient's chart for her  consent to telehealth for [facility]. Virtual platform was offered given ongoing worsening Covid-19 pandemic.  Date:  01/10/2019   ID:  Beverly Tucker, DOB 03/29/1940, MRN [id number]  Patient Location: Home Provider Location: Office  PCP:  [name], MD  Cardiologist:  Ena Dawley, MD  Electrophysiologist:  None   Evaluation Performed:  Follow-Up Visit  Chief Complaint:  F/u SVT  History of Present Illness:    Beverly Tucker is a 78 y.o. female with recently diagnosed stage 4 breast cancer on chemotherapy, recurrent malignant pleural effusionwith Pleurx catheter, paroxysmal atrial fibrillation, PSVT with tachy-brady syndrome, first degree AV block with possible transient higher grade AV block in 12/2018, HTN, GERD, anxiety, chronic appearing anemia, chronic diastolic CHF and CKD stage III who presents for virtual follow-up of SVT.  It has been a very tumultous year for Beverly Tucker. She was initially seen in the ED 10/05/18 with transient SVT. She was also found to have ulcerations under her right breast with recommendation for close OP f/u. She was then hospitalized 10/14/2018 and found to have a new left pleural effusion  and new fungating mass on breast. Thoracentesis showed exudative effusion with adenocarcinoma confirmed to be metastatic breast cancer. She also was found to have bone mets. She had atrial fibrillation with RVR during that hospitalization and was treated with metoprolol and Eliquis (CHADSVASC of 5). She was seen in the ED 10/2018 with worsening SOB, edema, and palpitations. In the ED she was found to have SVT with heart rate up to the 180s which converted to NSR with adenosine. Chest x-ray showed small bilateral pleural effusions and somewhat new hazy opacity.She was started on empiric azithromycin.She was admitted and treated for acute on chronic diastolic heart failure and treated with IV diuresis. Metoprolol was adjusted due to recurrent episodes of SVT while admitted. 2D echo 11/21/18 showed normal EF. She has been treated with abemaciclib, letrozole, and [facility] oncology. She was readmitted 12/14/18-12/20/18 with recurrent PSVT in the context of HAP. Amiodarone was started for rhythm control. Metoprolol was initially held due to bradycardia then readded at metoprolol 50mg  BID due to breakthrough episodes. Repeat echo 11/15 showed EF 60-65%, grade 1 DD, otherwise unrevealing. She was readmitted 12/24/18 with generalized weakness/fatigue with ongoing medical issues and was found to be mildly bradycardic. Cardiology was consulted for possible high grade AV block although telemetry showed an irregular baseline making P wave determination challenging to fully identify. Her metoprolol was ultimately discontinued, with recommendation to consider PRN approach in the future. Losartan was also discontinued in context of low blood pressure. Palliative care was consulted given numerous ongoing medical issues in context of metastatic cancer and she elected to remain a full code and treat the treatable. She was also treated  for acute on chronic hypoxia and possible UTI. Last labs 12/2018 showed Hgb 10.6, K 3.9, Cr 1.08,  Mg 1.7-2.2, LFTS ok, 10/2018 TSH wnl.  She is seen back virtually for follow-up. Overall she states she is feeling quite good today. She does notice fluctuating LEE, especially worse if she has her legs hanging down for a while or seated in a chair for long periods of time. This was not an issue prior to her breast CA diagnosis. Her weight is up 4lb since hospital discharge when she was ill, but actually down 24lb since September 2020. A true steady weight has been somewhat difficult to track because of her weight loss related to poor appetite. She says she's been eating well the past couple days finally which is likely contributing to some weight gain. See nurse phone note yesterday with concern for drinking Gatorade and eating foods high in sodium. Beverly Tucker does not have her blood pressure reading today but states it's been running elevated since discharge from the hospital. She indicates it was [id number] systolic yesterday afternoon but when rechecked later, it was in the 140s. She denies any CP, dyspnea, palpitations, presyncope or syncope. Her son helps with her medications.  The patient does not have symptoms concerning for COVID-19 infection (fever, chills, cough, or new shortness of breath).    Past Medical History:  Diagnosis Date   Anemia    Cancer ([place])    Breast    Chronic diastolic CHF (congestive heart failure) (HCC)    CKD (chronic kidney disease), stage III    First degree AV block    GERD (gastroesophageal reflux disease)    Hypertension    Malignant pleural effusion    Metastatic breast cancer (HCC)    PAF (paroxysmal atrial fibrillation) (HCC)    PSVT (paroxysmal supraventricular tachycardia) (HCC)    Tachy-brady syndrome ([place])    Past Surgical History:  Procedure Laterality Date   CHOLECYSTECTOMY     IR PERC PLEURAL DRAIN W/INDWELL CATH W/IMG GUIDE  11/08/2018   IR THORACENTESIS ASP PLEURAL SPACE W/IMG GUIDE  10/22/2018   IR THORACENTESIS ASP PLEURAL SPACE  W/IMG GUIDE  12/25/2018     Current Meds  Medication Sig   acetaminophen (TYLENOL) 500 MG tablet Take 1,000 mg by mouth every 6 (six) hours as needed.   ALPRAZolam (XANAX) 0.5 MG tablet Take 0.5-1 tablets (0.25-0.5 mg total) by mouth 2 (two) times daily as needed for anxiety or sleep.   amiodarone (PACERONE) 200 MG tablet Take 1 tablet (200 mg total) by mouth daily.   apixaban (ELIQUIS) 5 MG TABS tablet Take 1 tablet (5 mg total) by mouth 2 (two) times daily.   ergocalciferol (VITAMIN D2) 1.25 MG (50000 UT) capsule Take 1 capsule (50,000 Units total) by mouth once a week.   furosemide (LASIX) 40 MG tablet Take 1 tablet (40 mg total) by mouth daily.   hydrOXYzine (ATARAX/VISTARIL) 25 MG tablet Take 1 tablet (25 mg total) by mouth 3 (three) times daily as needed for anxiety (for anxiety and insomnia/sleep).   letrozole (FEMARA) 2.5 MG tablet Take 1 tablet (2.5 mg total) by mouth daily.   loperamide (IMODIUM) 2 MG capsule Take 1 capsule (2 mg total) by mouth as needed for diarrhea or loose stools.   mirtazapine (REMERON) 15 MG tablet Take 1 tablet (15 mg total) by mouth at bedtime.   ondansetron (ZOFRAN ODT) 4 MG disintegrating tablet Take 1 tablet (4 mg total) by mouth every 8 (eight) hours as needed for  nausea or vomiting.     Allergies:   Patient has no known allergies.   Social History   Tobacco Use   Smoking status: Never Smoker   Smokeless tobacco: Never Used  Substance Use Topics   Alcohol use: Never   Drug use: Never     Family Hx: The patient's family history includes Diabetes Mellitus II in an other family member.  ROS:   Please see the history of present illness.    All other systems reviewed and are negative.   Prior CV studies:    Most recent pertinent cardiac studies are outlined above.  Labs/Other Tests and Data Reviewed:    EKG:  An ECG dated 12/30/18 was personally reviewed today and demonstrated:  NSR 64bpm first degree AVB, baseline artifact,  no acute STT Changes  Recent Labs: 11/16/2018: TSH 2.545 12/25/2018: B Natriuretic Peptide 68.2 12/30/2018: Magnesium 1.7 01/02/2019: ALT 28; BUN 11; Creatinine 1.08; Hemoglobin 10.6; Platelet Count 319; Potassium 3.9; Sodium 140   Recent Lipid Panel Lab Results  Component Value Date/Time   CHOL 165 11/17/2018 12:31 AM   TRIG 132 11/17/2018 12:31 AM   HDL 36 (L) 11/17/2018 12:31 AM   CHOLHDL 4.6 11/17/2018 12:31 AM   LDLCALC 103 (H) 11/17/2018 12:31 AM    Wt Readings from Last 3 Encounters:  01/10/19 173 lb (78.5 kg)  01/02/19 170 lb 9 oz (77.4 kg)  12/31/18 169 lb 14.4 oz (77.1 kg)     Objective:    Vital Signs:  Pulse 77    Ht 5\' 5"  (1.651 m)    Wt 173 lb (78.5 kg)    BMI 28.79 kg/m    VS reviewed. General - calm F in no acute distress Pulm - No labored breathing, no coughing during visit, no audible wheezing, speaking in full sentences Neuro - A+Ox3, no slurred speech, answers questions appropriately Psych - Pleasant affect     ASSESSMENT & PLAN:    1. PSVT with tachy/brady syndrome -seems to be stable on amiodarone at present time.  We will continue the present dose and send in refills. She will remain off metoprolol.  The amiodarone is preferential to continue because it offers rhythm control.  She has not been felt to be an ideal candidate for implantable device given her comorbidities, so we will continue to treat medically and observe.  There is no acute indication for pacemaker at present time. Further monitoring of organ systems while on amiodarone will be at discretion of primary cardiologist in subsequent follow-ups. Would hold off PFTs given her underlying malignancy as I do not suspect it would change management acutely.  2. Chronic diastolic CHF - home health nurse note yesterday indicated concern for recurrent lower extremity edema and noncompliance with fluid and dietary restrictions.  This is obviously a tricky situation given her metastatic cancer and quality  of life.  I did discuss the importance of avoiding Gatorade and electrolyte replacement solutions as they contain sodium which are only likely to contribute to worsening fluid retention.  We also discussed the previous recommendation to limit fluids to 60 ounces daily.  She sounds like she drinks quite a bit of fluid throughout the day.  We also discussed limiting sodium to 2000 mg daily, as well as use of elevation/compression.  She reports her blood pressure is now running a little bit higher than normal.  I considered resuming part of her losartan, but it sounds like first and foremost she needs a little bit better control of  her edema.  Therefore I will increase her Lasix to 1.5 tablets (60 mg) daily and add potassium 20 mEq daily in the form of 2 74mEQ tablets at her request due to tablet size.  I will ask our medical assistant to find out from her home health team if they would be able to get a BMET in 1 week to reassess her kidney function and potassium.  We will plan for repeat virtual visit in 1 week.  She also asked [place] to call her son to relay these medication changes and I will have our office do so. Of note, she is maintained on anticoagulation as well. 3. Essential HTN with more recent orthostatic hypotension - follow with medication changes.  Would not be overly aggressive with blood pressure control given her tendency to swing in either direction.  Her hospital course is proved she is quite tenuous.   4. Paroxysmal atrial fibrillation - no bleeding reported on Eliquis.  Continue present regimen.  COVID-19 Education: The signs and symptoms of COVID-19 were discussed with the patient and how to seek care for testing (follow up with PCP or arrange E-visit).  The importance of social distancing was discussed today.  Time:   Today, I have spent 23 minutes with the patient with telehealth technology discussing the above problems.     Medication Adjustments/Labs and Tests Ordered: Current  medicines are reviewed at length with the patient today.  Concerns regarding medicines are outlined above.   Follow Up: Virtually with APP in 1 week  Signed, Charlie Pitter, PA-C  01/10/2019 10:21 AM    [place]

## 2019-01-09 NOTE — Telephone Encounter (Signed)
Pts home health Nurse is calling just to give Melina Copa PA-C some information about the pt needing some education in regards to her diet and fluids she is drinking.  [place] Nurse from [place] is calling because the pt has a virtual telephone visit with Dayna tomorrow 12/11 at 10 am, and she wanted to keep her in the loop about her ongoing lower extremity swelling, and weights, as reported in this message. Shareej RN states this is not a new change in the pts condition, but she feels her poor choices with what she eats and drinks contributes to these issues. Per America Brown RN, the pt needs more education about her diet and what she is drinking, for when she goes into the home, she notes the pt is eating food high in sodium, and drinks gatorade on a regular basis.  Shareej RN states she educates her twice a week about restricting these things in her diet, but the pt gives her push back stating "until the Provider tells me to stop eating and drinking this, I will continue to do so." Shareej RN has been trying to keep the pt on track with her diet and having her monitor and log her dry weights daily.  Shareej RN reports this is not of top priority in the pts mind.  Informed Shareej RN that I will pass this helpful information along to [facility].   Per America Brown RN, if Dayna needs any additional information from her, she can feel free to call her at [phone].  Shareej RN states that the pt is aware of her virtual appt tomorrow, and to be close by her phone when our office calls. Shareej RN was gracious for all the assistance provided.

## 2019-01-10 ENCOUNTER — Encounter: Payer: Self-pay | Admitting: Physician Assistant

## 2019-01-10 ENCOUNTER — Telehealth (INDEPENDENT_AMBULATORY_CARE_PROVIDER_SITE_OTHER): Payer: Medicare HMO | Admitting: Physician Assistant

## 2019-01-10 ENCOUNTER — Other Ambulatory Visit: Payer: Self-pay

## 2019-01-10 ENCOUNTER — Telehealth: Payer: Self-pay

## 2019-01-10 VITALS — HR 77 | Ht 65.0 in | Wt 173.0 lb

## 2019-01-10 DIAGNOSIS — I480 Paroxysmal atrial fibrillation: Secondary | ICD-10-CM

## 2019-01-10 DIAGNOSIS — I10 Essential (primary) hypertension: Secondary | ICD-10-CM

## 2019-01-10 DIAGNOSIS — I471 Supraventricular tachycardia: Secondary | ICD-10-CM

## 2019-01-10 DIAGNOSIS — I495 Sick sinus syndrome: Secondary | ICD-10-CM

## 2019-01-10 DIAGNOSIS — I951 Orthostatic hypotension: Secondary | ICD-10-CM

## 2019-01-10 DIAGNOSIS — I5032 Chronic diastolic (congestive) heart failure: Secondary | ICD-10-CM

## 2019-01-10 MED ORDER — FUROSEMIDE 40 MG PO TABS: ORAL_TABLET | ORAL | 3 refills | Status: AC

## 2019-01-10 MED ORDER — AMIODARONE HCL 200 MG PO TABS: 200.0000 mg | ORAL_TABLET | Freq: Every day | ORAL | 3 refills | Status: AC

## 2019-01-10 MED ORDER — POTASSIUM CHLORIDE ER 10 MEQ PO TBCR: 20.0000 meq | EXTENDED_RELEASE_TABLET | Freq: Every day | ORAL | 3 refills | Status: AC

## 2019-01-10 NOTE — Progress Notes (Signed)
Per Melina Copa, PAC is s/w pt's son [name] in regards to medications changes and appt. Pt's son was notified of med changes made today: increase lasix to 60 mg daily; start K+ 10 meq take 2 tabs daily = 20 meq daily, refill for amiodarone was sent in. Advised Eric that [place] nurse will be coming out next week to draw lab work bmet. Follow up appt has been made for another phone visit 01/15/19 with Richardson Dopp, PAC. Pt's son has also bee advised pt needs to follow a low salt diet, limit fluid intake to no more than 60 oz daily, call if wt is up 2-3 lb's x 1 day or 5 lb's x 1 week or increased shortness of breath, edema. [name] thanked me for the call.

## 2019-01-10 NOTE — Patient Instructions (Addendum)
Medication Instructions:   1.  Please increase furosemide to 1.5 tablets daily (60mg  total)  2. START POTASSIUM 67meq take 2 tablets by mouth daily = 20 MEQ DAILY  3. REFILL FOR AMIODARONE 200 MG DAILY WAS SENT IN *If you need a refill on your cardiac medications before your next appointment, please call your pharmacy*  Lab Work: NONE ORDERED If you have labs (blood work) drawn today and your tests are completely normal, you will receive your results only by: Marland Kitchen MyChart Message (if you have MyChart) OR . A paper copy in the mail If you have any lab test that is abnormal or we need to change your treatment, we will call you to review the results.  Testing/Procedures: NONE ORDERED  Follow-Up: At [facility], you and your health needs are our priority.  As part of our continuing mission to provide you with exceptional heart care, we have created designated Provider Care Teams.  These Care Teams include your primary Cardiologist (physician) and Advanced Practice Providers (APPs -  Physician Assistants and Nurse Practitioners) who all work together to provide you with the care you need, when you need it.  Your next appointment:     The format for your next appointment:    Provider:     Other Instructions "For patients with congestive heart failure, we give them these special instructions:  1. Follow a low-salt diet - you are allowed no more than 2,000mg  of sodium per day. Watch your fluid intake. In general, you should not be taking in more than 60 oz of fluid per day. This includes sources of water in foods like soup, coffee, tea, milk, etc. I would recommend you avoid Gatorade because of the extra salt in it.  2. Weigh yourself on the same scale at same time of day and keep a log.  3. Call your doctor: (Anytime you feel any of the following symptoms)  - 3lb weight gain overnight or 5lb within a few days  - Shortness of breath, with or without a dry hacking cough  - Swelling in the  hands, feet or stomach  - If you have to sleep on extra pillows at night in order to breathe  IT IS IMPORTANT TO LET YOUR DOCTOR KNOW EARLY ON IF YOU ARE HAVING SYMPTOMS SO WE CAN HELP YOU!"

## 2019-01-10 NOTE — Telephone Encounter (Signed)
YOUR CARDIOLOGY TEAM HAS ARRANGED FOR AN E-VISIT FOR YOUR APPOINTMENT - PLEASE REVIEW IMPORTANT INFORMATION BELOW SEVERAL DAYS PRIOR TO YOUR APPOINTMENT  Due to the recent COVID-19 pandemic, we are transitioning in-person office visits to tele-medicine visits in an effort to decrease unnecessary exposure to our patients, their families, and staff. These visits are billed to your insurance just like a normal visit is. We also encourage you to sign up for MyChart if you have not already done so. You will need a smartphone if possible. For patients that do not have this, we can still complete the visit using a regular telephone but do prefer a smartphone to enable video when possible. You may have a family member that lives with you that can help. If possible, we also ask that you have a blood pressure cuff and scale at home to measure your blood pressure, heart rate and weight prior to your scheduled appointment. Patients with clinical needs that need an in-person evaluation and testing will still be able to come to the office if absolutely necessary. If you have any questions, feel free to call our office.     YOUR PROVIDER WILL BE USING THE FOLLOWING PLATFORM TO COMPLETE YOUR VISIT: PHONE CALL  . IF USING MYCHART - How to Download the MyChart App to Your SmartPhone   - If Apple, go to [employer] and type in MyChart in the search bar and download the app. If Android, ask patient to go to [employer] and type in [place] in the search bar and download the app. The app is free but as with any other app downloads, your phone may require you to verify saved payment information or Apple/Android password.  - You will need to then log into the app with your MyChart username and password, and select [place] as your healthcare provider to link the account.  - When it is time for your visit, go to the MyChart app, find appointments, and click Begin Video Visit. Be sure to Select Allow for your device to  access the Microphone and Camera for your visit. You will then be connected, and your provider will be with you shortly.  **If you have any issues connecting or need assistance, please contact MyChart service desk [phone])**  **If using a computer, in order to ensure the best quality for your visit, you will need to use either of the following Internet Browsers: Insurance underwriter or [employer]**  . IF USING DOXIMITY or DOXY.ME - The staff will give you instructions on receiving your link to join the meeting the day of your visit.      THE DAY OF YOUR APPOINTMENT  Approximately 15 minutes prior to your scheduled appointment, you will receive a telephone call from one of [facility] team - your caller ID may say "Unknown caller."  Our staff will confirm medications, vital signs for the day and any symptoms you may be experiencing. Please have this information available prior to the time of visit start. It may also be helpful for you to have a pad of paper and pen handy for any instructions given during your visit. They will also walk you through joining the smartphone meeting if this is a video visit.    CONSENT FOR TELE-HEALTH VISIT - PLEASE REVIEW  I hereby voluntarily request, consent and authorize [facility] and its employed or contracted physicians, physician assistants, nurse practitioners or other licensed health care professionals (the Practitioner), to provide me with telemedicine health  care services (the "Services") as deemed necessary by the treating Practitioner. I acknowledge and consent to receive the Services by the Practitioner via telemedicine. I understand that the telemedicine visit will involve communicating with the Practitioner through live audiovisual communication technology and the disclosure of certain medical information by electronic transmission. I acknowledge that I have been given the opportunity to request an in-person assessment or other  available alternative prior to the telemedicine visit and am voluntarily participating in the telemedicine visit.  I understand that I have the right to withhold or withdraw my consent to the use of telemedicine in the course of my care at any time, without affecting my right to future care or treatment, and that the Practitioner or I may terminate the telemedicine visit at any time. I understand that I have the right to inspect all information obtained and/or recorded in the course of the telemedicine visit and may receive copies of available information for a reasonable fee.  I understand that some of the potential risks of receiving the Services via telemedicine include:  Marland Kitchen Delay or interruption in medical evaluation due to technological equipment failure or disruption; . Information transmitted may not be sufficient (e.g. poor resolution of images) to allow for appropriate medical decision making by the Practitioner; and/or  . In rare instances, security protocols could fail, causing a breach of personal health information.  Furthermore, I acknowledge that it is my responsibility to provide information about my medical history, conditions and care that is complete and accurate to the best of my ability. I acknowledge that Practitioner's advice, recommendations, and/or decision may be based on factors not within their control, such as incomplete or inaccurate data provided by me or distortions of diagnostic images or specimens that may result from electronic transmissions. I understand that the practice of medicine is not an exact science and that Practitioner makes no warranties or guarantees regarding treatment outcomes. I acknowledge that I will receive a copy of this consent concurrently upon execution via email to the email address I last provided but may also request a printed copy by calling the office of [place].    I understand that my insurance will be billed for this visit.   I have  read or had this consent read to me. . I understand the contents of this consent, which adequately explains the benefits and risks of the Services being provided via telemedicine.  . I have been provided ample opportunity to ask questions regarding this consent and the Services and have had my questions answered to my satisfaction. . I give my informed consent for the services to be provided through the use of telemedicine in my medical care  By participating in this telemedicine visit I agree to the above.

## 2019-01-13 ENCOUNTER — Other Ambulatory Visit: Payer: Self-pay | Admitting: Internal Medicine

## 2019-01-15 ENCOUNTER — Other Ambulatory Visit: Payer: Self-pay

## 2019-01-15 ENCOUNTER — Encounter: Payer: Self-pay | Admitting: Physician Assistant

## 2019-01-15 ENCOUNTER — Telehealth (INDEPENDENT_AMBULATORY_CARE_PROVIDER_SITE_OTHER): Payer: Medicare HMO | Admitting: Physician Assistant

## 2019-01-15 VITALS — Ht 65.0 in | Wt 168.0 lb

## 2019-01-15 DIAGNOSIS — I5032 Chronic diastolic (congestive) heart failure: Secondary | ICD-10-CM | POA: Diagnosis not present

## 2019-01-15 DIAGNOSIS — I480 Paroxysmal atrial fibrillation: Secondary | ICD-10-CM

## 2019-01-15 DIAGNOSIS — I471 Supraventricular tachycardia: Secondary | ICD-10-CM

## 2019-01-15 DIAGNOSIS — J90 Pleural effusion, not elsewhere classified: Secondary | ICD-10-CM

## 2019-01-15 DIAGNOSIS — C50919 Malignant neoplasm of unspecified site of unspecified female breast: Secondary | ICD-10-CM

## 2019-01-15 DIAGNOSIS — N1831 Chronic kidney disease, stage 3a: Secondary | ICD-10-CM

## 2019-01-15 DIAGNOSIS — R001 Bradycardia, unspecified: Secondary | ICD-10-CM

## 2019-01-15 NOTE — Progress Notes (Signed)
Virtual Visit via Telephone Note   This visit type was conducted due to national recommendations for restrictions regarding the COVID-19 Pandemic (e.g. social distancing) in an effort to limit this patient's exposure and mitigate transmission in our community.  Due to her co-morbid illnesses, this patient is at least at moderate risk for complications without adequate follow up.  This format is felt to be most appropriate for this patient at this time.  The patient did not have access to video technology/had technical difficulties with video requiring transitioning to audio format only (telephone).  All issues noted in this document were discussed and addressed.  No physical exam could be performed with this format.  Please refer to the patient's chart for her  consent to telehealth for [facility].   Date:  01/15/2019   ID:  Beverly Tucker, DOB Dec 26, 1940, MRN [id number]  Patient Location: Home Provider Location: Home  PCP:  [name], MD  Cardiologist:  Ena Dawley, MD   Electrophysiologist:  None   Evaluation Performed:  Follow-Up Visit  Chief Complaint: CHF  History of Present Illness:    Beverly Tucker is a 78 y.o. female with   Chronic diastolic CHF  Paroxysmal atrial fibrillation  CHA2DS2-VASc=5 (HTN, CHF, female, age x 2) >> Apixaban  Paroxysmal SVT  Admitted 10/2018 with decompensated heart failure and SVT; required adenosine x2  Maintained on beta-blocker therapy  Readmitted 12/2018: Amiodarone started  Metastatic breast cancer  History of recurrent malignant pleural effusion  S/p Pleurx catheter on L  Chemotherapy: abemaciclib, letrozole  Hypertension   Chronic kidney disease 3   GERD  Anxiety  She was admitted in September 2020 with left pleural effusion and breast mass.  She underwent thoracentesis and cytology confirmed adenocarcinoma.  She ultimately underwent Pleurx catheter placement.  She was noted to have atrial fibrillation and  was placed on Apixaban for anticoagulation.  She was readmitted in October 2020 with decompensated heart failure and SVT.  She required adenosine on 2 occasions and her beta-blocker dose was adjusted.  She was readmitted in 12/2018 with recurrent PSVT in the setting of pneumonia.  She was placed on amiodarone.  Metoprolol was initially held due to bradycardia but resumed due to breakthrough episodes of SVT.  She was then readmitted in 12/2018 with generalized weakness and fatigue.  There was some concern for high-grade AV block and her beta-blocker was stopped.  She required R sided thoracentesis that admission.  She was seen in follow-up by Melina Copa, PA-C 01/10/2019.  She seemed to be stable on amiodarone and she was kept off of metoprolol.  She was volume overloaded and her furosemide dose was adjusted to 60 mg daily.  Today, she notes that she increased her Lasix to 100 mg daily, not 60 mg daily.  She has not really noticed much improvement in her leg swelling.  Her weights have been stable.  She sleeps on 2 pillows.  She has not had paroxysmal nocturnal dyspnea.  She has not had chest pain or syncope.  She does note shortness of breath with minimal activity.  This seems to be fairly stable.  She has not had fever or cough.  She has not had melena or hematochezia.  She has not had rapid palpitations.  However, she feels shaky when she gets up to do things.  Also, over the past week, she has felt quite fatigued.    Past Medical History:  Diagnosis Date  . Anemia   . Cancer ([place])  Breast   . Chronic diastolic CHF (congestive heart failure) ([place])   . CKD (chronic kidney disease), stage III   . First degree AV block   . GERD (gastroesophageal reflux disease)   . Hypertension   . Malignant pleural effusion   . Metastatic breast cancer ([place])   . PAF (paroxysmal atrial fibrillation) ([place])   . PSVT (paroxysmal supraventricular tachycardia) ([place])   . Tachy-brady syndrome [facility])    Past Surgical  History:  Procedure Laterality Date  . CHOLECYSTECTOMY    . IR PERC PLEURAL DRAIN W/INDWELL CATH W/IMG GUIDE  11/08/2018  . IR THORACENTESIS ASP PLEURAL SPACE W/IMG GUIDE  10/22/2018  . IR THORACENTESIS ASP PLEURAL SPACE W/IMG GUIDE  12/25/2018     Current Meds  Medication Sig  . acetaminophen (TYLENOL) 500 MG tablet Take 1,000 mg by mouth every 6 (six) hours as needed.  . ALPRAZolam (XANAX) 0.5 MG tablet Take 0.5-1 tablets (0.25-0.5 mg total) by mouth 2 (two) times daily as needed for anxiety or sleep.  Marland Kitchen amiodarone (PACERONE) 200 MG tablet Take 1 tablet (200 mg total) by mouth daily.  Marland Kitchen apixaban (ELIQUIS) 5 MG TABS tablet Take 1 tablet (5 mg total) by mouth 2 (two) times daily.  . ergocalciferol (VITAMIN D2) 1.25 MG (50000 UT) capsule Take 1 capsule (50,000 Units total) by mouth once a week.  . furosemide (LASIX) 40 MG tablet Take 1 and 1/2 tabs daily to = 60 mg daily  . hydrOXYzine (ATARAX/VISTARIL) 25 MG tablet Take 1 tablet (25 mg total) by mouth 3 (three) times daily as needed for anxiety (for anxiety and insomnia/sleep).  Marland Kitchen letrozole (FEMARA) 2.5 MG tablet Take 1 tablet (2.5 mg total) by mouth daily.  Marland Kitchen loperamide (IMODIUM) 2 MG capsule Take 1 capsule (2 mg total) by mouth as needed for diarrhea or loose stools.  . mirtazapine (REMERON) 15 MG tablet Take 1 tablet (15 mg total) by mouth at bedtime.  . ondansetron (ZOFRAN ODT) 4 MG disintegrating tablet Take 1 tablet (4 mg total) by mouth every 8 (eight) hours as needed for nausea or vomiting.  . potassium chloride (KLOR-CON) 10 MEQ tablet Take 2 tablets (20 mEq total) by mouth daily.     Allergies:   Patient has no known allergies.   Social History   Tobacco Use  . Smoking status: Never Smoker  . Smokeless tobacco: Never Used  Substance Use Topics  . Alcohol use: Never  . Drug use: Never     Family Hx: The patient's family history includes Diabetes Mellitus II in an other family member.  ROS:   Please see the history of  present illness.     All other systems reviewed and are negative.   Prior CV studies:   The following studies were reviewed today:   Echocardiogram 12/15/2018 EF 78-46, grade 1 diastolic dysfunction, trace MR, mild TR, trivial AI  Echocardiogram 11/18/2018 EF 96-29, grade 1 diastolic dysfunction  Echocardiogram 10/15/2018 EF >52, grade 1 diastolic dysfunction, normal wall motion, mild to moderate MAC  Labs/Other Tests and Data Reviewed:    EKG:  No ECG reviewed.  Recent Labs: 11/16/2018: TSH 2.545 12/25/2018: B Natriuretic Peptide 68.2 12/30/2018: Magnesium 1.7 01/02/2019: ALT 28; BUN 11; Creatinine 1.08; Hemoglobin 10.6; Platelet Count 319; Potassium 3.9; Sodium 140   Recent Lipid Panel Lab Results  Component Value Date/Time   CHOL 165 11/17/2018 12:31 AM   TRIG 132 11/17/2018 12:31 AM   HDL 36 (L) 11/17/2018 12:31 AM   CHOLHDL 4.6 11/17/2018  12:31 AM   LDLCALC 103 (H) 11/17/2018 12:31 AM    Wt Readings from Last 3 Encounters:  01/15/19 168 lb (76.2 kg)  01/10/19 173 lb (78.5 kg)  01/02/19 170 lb 9 oz (77.4 kg)     Objective:    Vital Signs:  Ht _0  (1.651 m)   Wt 168 lb (76.2 kg)   BMI 27.96 kg/m    VITAL SIGNS:  reviewed GEN:  no acute distress RESPIRATORY:  Normal respiratory effort NEURO:  Alert and oriented PSYCH:  Normal mood  ASSESSMENT & PLAN:    1. Chronic diastolic CHF (congestive heart failure) ([place]) Echo in November 2020 demonstrated normal LV function with mild diastolic dysfunction.  She has had issues with decompensated heart failure in the setting of SVT.  Recently, her Lasix was adjusted due to leg swelling.  It does not sound as though she has had much improvement with increased dose of Lasix.  Of note, she was supposed to increase her Lasix from 40 mg to 60 mg daily.  However, she has been taking 100 mg daily.  It sounds as though she had a repeat BMET obtained yesterday by home health nurse.  We will try to track down those results.  For  now, I have recommended that she decrease her Lasix to 60 mg daily.  It is difficult to assess her current clinical status by telephone.  Therefore, we will arrange close follow-up in the office in the next 1 to 2 weeks.  2. PSVT (paroxysmal supraventricular tachycardia) ([place]) 3. Bradycardia She has had recurrent SVT and was ultimately placed on amiodarone.  She then developed questionable high-grade AV block and her metoprolol was stopped.  She has not really experienced rapid palpitations.  However, she does feel "shaky" at times.  She describes feeling like this in the past as well.  Question if she is having recurrent episodes of SVT.  Therefore, I will arrange for her to have a 3 to 14-day AT Zio patch.  Close follow-up will be arranged in the office as noted.  4. PAF (paroxysmal atrial fibrillation) ([place]) She is currently on Apixaban for anticoagulation.  5. Stage 3a chronic kidney disease As noted, repeat BMET was obtained this week by home health nurse.  We will try to obtain those results.  6. Metastatic breast cancer [facility]) She is followed by oncology.  7. 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 She has a Pleurx catheter on the left.  During her most recent admission to the hospital in November, she had right-sided thoracentesis performed.  She does note shortness of breath with activities.  Question if she has recurrent effusion on the right.  Follow-up chest x-ray will be arranged as well.   Time:   Today, I have spent 24 minutes with the patient with telehealth technology discussing the above problems.     Medication Adjustments/Labs and Tests Ordered: Current medicines are reviewed at length with the patient today.  Concerns regarding medicines are outlined above.   Tests Ordered: Orders Placed This Encounter  Procedures  . DG Chest 2 View  . LONG TERM MONITOR-LIVE TELEMETRY (3-14 DAYS)    Medication Changes: No orders of the defined types were placed in this  encounter.   Follow Up:  In Person in 1-2 week(s)  Signed, Richardson Dopp, PA-C  01/15/2019 5:48 PM    [facility]

## 2019-01-15 NOTE — Patient Instructions (Signed)
Medication Instructions:   Your physician has recommended you make the following change in your medication:   1) Decrease Furosemide to 60MG , 1 1/2 tablets by mouth once a day  *If you need a refill on your cardiac medications before your next appointment, please call your pharmacy*  Lab Work:  None ordered today  Testing/Procedures:  We have ordered a chest xray   Follow-Up:  In 1-2 weeks with Richardson Dopp PA-C or Ena Dawley

## 2019-01-16 ENCOUNTER — Telehealth: Payer: Self-pay | Admitting: Radiology

## 2019-01-16 NOTE — Telephone Encounter (Signed)
Enrolled patient for a 14 day Zio AT Telemetry monitor to be mailed to patients home.

## 2019-01-17 ENCOUNTER — Telehealth: Payer: Self-pay | Admitting: Physician Assistant

## 2019-01-17 NOTE — Telephone Encounter (Signed)
   Reviewed cc'd chart from Scott's follow-up OV with Ms. Damita Dunnings. Home [facility]) was supposed to draw a f/u BMET and send [place] that result but I do not see it populating in Epic. He reports in his note that he will see if we can try to get results, wasn't sure if there was update yet. We ordered it 12/11 to be done in 1 week so it might not have been done yet, until today. Just keep me posted how I can help. Scott, thank you for seeing her. I had had Arbie Cookey call the patient's son [name] with medication changes as well, but clearly that info didn't get interpreted on their end.  We always seem to collaborate on med rec patients and I appreciate your advocacy for our patients. Naveyah Iacovelli PA-C

## 2019-01-22 ENCOUNTER — Encounter: Payer: Self-pay | Admitting: *Deleted

## 2019-01-22 NOTE — Telephone Encounter (Signed)
Call received from [facility] with [employer].  They have not sent monitor to patch yet because they are trying to get prior approval for it.  They also have not received her records.  Please fax records to [phone].  Joellen Jersey states they have sent this request to our office twice.  Pt has not received monitor.

## 2019-01-22 NOTE — Progress Notes (Signed)
Patient ID: Beverly Tucker, female   DOB: [birth year], 78 y.o.   MRN: [id number] Office visit notes and order for Long Term Monitor-Live Telemetry from 01/15/19 faxed to [facility] at [phone].

## 2019-01-29 ENCOUNTER — Ambulatory Visit: Payer: Medicare HMO

## 2019-02-03 ENCOUNTER — Inpatient Hospital Stay: Payer: Medicare HMO | Attending: Hematology | Admitting: Hematology

## 2019-02-03 ENCOUNTER — Inpatient Hospital Stay: Payer: Medicare HMO

## 2019-02-03 ENCOUNTER — Other Ambulatory Visit: Payer: Self-pay

## 2019-02-03 VITALS — BP 161/83 | HR 86 | Temp 98.2°F | Resp 18 | Ht 65.0 in | Wt 171.6 lb

## 2019-02-03 DIAGNOSIS — Z170 Estrogen receptor positive status [ER+]: Secondary | ICD-10-CM | POA: Diagnosis not present

## 2019-02-03 DIAGNOSIS — C7951 Secondary malignant neoplasm of bone: Secondary | ICD-10-CM | POA: Diagnosis not present

## 2019-02-03 DIAGNOSIS — C50919 Malignant neoplasm of unspecified site of unspecified female breast: Secondary | ICD-10-CM

## 2019-02-03 DIAGNOSIS — J90 Pleural effusion, not elsewhere classified: Secondary | ICD-10-CM | POA: Diagnosis not present

## 2019-02-03 DIAGNOSIS — C50911 Malignant neoplasm of unspecified site of right female breast: Secondary | ICD-10-CM | POA: Diagnosis present

## 2019-02-03 LAB — CMP (CANCER CENTER ONLY)
ALT: 13 U/L (ref 0–44)
AST: 14 U/L — ABNORMAL LOW (ref 15–41)
Albumin: 3.1 g/dL — ABNORMAL LOW (ref 3.5–5.0)
Alkaline Phosphatase: 134 U/L — ABNORMAL HIGH (ref 38–126)
Anion gap: 11 (ref 5–15)
BUN: 15 mg/dL (ref 8–23)
CO2: 39 mmol/L — ABNORMAL HIGH (ref 22–32)
Calcium: 8.9 mg/dL (ref 8.9–10.3)
Chloride: 92 mmol/L — ABNORMAL LOW (ref 98–111)
Creatinine: 0.84 mg/dL (ref 0.44–1.00)
GFR, Est AFR Am: >60 mL/min (ref >60)
GFR, Estimated: >60 mL/min (ref >60)
Glucose, Bld: 131 mg/dL — ABNORMAL HIGH (ref 70–99)
Potassium: 3.9 mmol/L (ref 3.5–5.1)
Sodium: 142 mmol/L (ref 135–145)
Total Bilirubin: 0.2 mg/dL — ABNORMAL LOW (ref 0.3–1.2)
Total Protein: 6.8 g/dL (ref 6.5–8.1)

## 2019-02-03 LAB — CBC WITH DIFFERENTIAL/PLATELET
Abs Immature Granulocytes: 0.02 10*3/uL (ref 0.00–0.07)
Basophils Absolute: 0.1 10*3/uL (ref 0.0–0.1)
Basophils Relative: 1 %
Eosinophils Absolute: 0.1 10*3/uL (ref 0.0–0.5)
Eosinophils Relative: 2 %
HCT: 37 % (ref 36.0–46.0)
Hemoglobin: 10.8 g/dL — ABNORMAL LOW (ref 12.0–15.0)
Immature Granulocytes: 0 %
Lymphocytes Relative: 11 %
Lymphs Abs: 0.8 10*3/uL (ref 0.7–4.0)
MCH: 27.3 pg (ref 26.0–34.0)
MCHC: 29.2 g/dL — ABNORMAL LOW (ref 30.0–36.0)
MCV: 93.4 fL (ref 80.0–100.0)
Monocytes Absolute: 0.6 10*3/uL (ref 0.1–1.0)
Monocytes Relative: 7 %
Neutro Abs: 5.9 10*3/uL (ref 1.7–7.7)
Neutrophils Relative %: 79 %
Platelets: 323 10*3/uL (ref 150–400)
RBC: 3.96 MIL/uL (ref 3.87–5.11)
RDW: 14.6 % (ref 11.5–15.5)
WBC: 7.5 10*3/uL (ref 4.0–10.5)
nRBC: 0 % (ref 0.0–0.2)

## 2019-02-03 MED ORDER — LETROZOLE 2.5 MG PO TABS: 2.5000 mg | ORAL_TABLET | Freq: Every day | ORAL | 11 refills | Status: AC

## 2019-02-03 MED ORDER — ERGOCALCIFEROL 1.25 MG (50000 UT) PO CAPS: 50000.0000 [IU] | ORAL_CAPSULE | ORAL | 0 refills | Status: AC

## 2019-02-03 MED ORDER — ZOLEDRONIC ACID 4 MG/100ML IV SOLN
4.0000 mg | Freq: Once | INTRAVENOUS | Status: AC
  Administered 2019-02-03: 4 mg via INTRAVENOUS

## 2019-02-03 MED ORDER — ZOLEDRONIC ACID 4 MG/100ML IV SOLN
INTRAVENOUS | Status: AC
  Filled 2019-02-03: qty 100

## 2019-02-03 NOTE — Patient Instructions (Signed)
Zoledronic Acid injection (Hypercalcemia, Oncology) What is this medicine? ZOLEDRONIC ACID (ZOE le dron ik AS id) lowers the amount of calcium loss from bone. It is used to treat too much calcium in your blood from cancer. It is also used to prevent complications of cancer that has spread to the bone. This medicine may be used for other purposes; ask your health care provider or pharmacist if you have questions. COMMON BRAND NAME(S): Zometa What should I tell my health care provider before I take this medicine? They need to know if you have any of these conditions:  aspirin-sensitive asthma  cancer, especially if you are receiving medicines used to treat cancer  dental disease or wear dentures  infection  kidney disease  receiving corticosteroids like dexamethasone or prednisone  an unusual or allergic reaction to zoledronic acid, other medicines, foods, dyes, or preservatives  pregnant or trying to get pregnant  breast-feeding How should I use this medicine? This medicine is for infusion into a vein. It is given by a health care professional in a hospital or clinic setting. Talk to your pediatrician regarding the use of this medicine in children. Special care may be needed. Overdosage: If you think you have taken too much of this medicine contact a poison control center or emergency room at once. NOTE: This medicine is only for you. Do not share this medicine with others. What if I miss a dose? It is important not to miss your dose. Call your doctor or health care professional if you are unable to keep an appointment. What may interact with this medicine?  certain antibiotics given by injection  NSAIDs, medicines for pain and inflammation, like ibuprofen or naproxen  some diuretics like bumetanide, furosemide  teriparatide  thalidomide This list may not describe all possible interactions. Give your health care provider a list of all the medicines, herbs, non-prescription  drugs, or dietary supplements you use. Also tell them if you smoke, drink alcohol, or use illegal drugs. Some items may interact with your medicine. What should I watch for while using this medicine? Visit your doctor or health care professional for regular checkups. It may be some time before you see the benefit from this medicine. Do not stop taking your medicine unless your doctor tells you to. Your doctor may order blood tests or other tests to see how you are doing. Women should inform their doctor if they wish to become pregnant or think they might be pregnant. There is a potential for serious side effects to an unborn child. Talk to your health care professional or pharmacist for more information. You should make sure that you get enough calcium and vitamin D while you are taking this medicine. Discuss the foods you eat and the vitamins you take with your health care professional. Some people who take this medicine have severe bone, joint, and/or muscle pain. This medicine may also increase your risk for jaw problems or a broken thigh bone. Tell your doctor right away if you have severe pain in your jaw, bones, joints, or muscles. Tell your doctor if you have any pain that does not go away or that gets worse. Tell your dentist and dental surgeon that you are taking this medicine. You should not have major dental surgery while on this medicine. See your dentist to have a dental exam and fix any dental problems before starting this medicine. Take good care of your teeth while on this medicine. Make sure you see your dentist for regular follow-up   appointments. What side effects may I notice from receiving this medicine? Side effects that you should report to your doctor or health care professional as soon as possible:  allergic reactions like skin rash, itching or hives, swelling of the face, lips, or tongue  anxiety, confusion, or depression  breathing problems  changes in vision  eye  pain  feeling faint or lightheaded, falls  jaw pain, especially after dental work  mouth sores  muscle cramps, stiffness, or weakness  redness, blistering, peeling or loosening of the skin, including inside the mouth  trouble passing urine or change in the amount of urine Side effects that usually do not require medical attention (report to your doctor or health care professional if they continue or are bothersome):  bone, joint, or muscle pain  constipation  diarrhea  fever  hair loss  irritation at site where injected  loss of appetite  nausea, vomiting  stomach upset  trouble sleeping  trouble swallowing  weak or tired This list may not describe all possible side effects. Call your doctor for medical advice about side effects. You may report side effects to FDA at 1-800-FDA-1088. Where should I keep my medicine? This drug is given in a hospital or clinic and will not be stored at home. NOTE: This sheet is a summary. It may not cover all possible information. If you have questions about this medicine, talk to your doctor, pharmacist, or health care provider.  2020 Elsevier/Gold Standard (2013-06-14 14:19:39)  

## 2019-02-03 NOTE — Progress Notes (Signed)
HEMATOLOGY/ONCOLOGY CONSULTATION NOTE  Date of Service: 02/03/2019  Patient Care Team: [name], MD as PCP - General (Family Medicine) Dorothy Spark, MD as PCP - Cardiology (Cardiology)  CHIEF COMPLAINTS/PURPOSE OF CONSULTATION:  Breast Cancer  HISTORY OF PRESENTING ILLNESS:   Beverly Tucker is a wonderful 79 y.o. female with past medical history of hypertension who presents with a 3-week history of progressive worsening of shortness of breath.  Patient reports that symptoms started with a runny nose and cough productive of clear white mucus.  Patient saw outpatient provider, was COVID negative, was started on allergy medication.  Shortness of breath continue to progress, saw provider on 9/12, was diagnosed with pneumonia, and started on Keflex.  At that visit, patient was also started on doxycycline for cellulitis (under right breast).  Patient reports taking both medications consistently every day.  Patient reports that shortness of breath is worse with exertion, worse when lying flat, reports sleeping with 2 pillows, denies PND symptoms.  Patient denies fever, chills, muscle aches, weight loss/gain, abdominal pain, urinary symptoms, diarrhea.  Reports 1 episode of nonbilious, nonbloody vomit last Saturday.  Patient reports lower extremity swelling over the past 2 days.  States last colonoscopy was 3 to 4 years ago, not sure when she is supposed to follow-up again.  Patient reports never having a mammogram.  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 Patient reports lesion under right breast which is non-tender and has been developing for the past couple of months. She has been taking doxycycline as this was thought to be cellulitis.  PCP is Kathryne Eriksson with [facility].   INTERVAL HISTORY:   Beverly Tucker is a wonderful 79 y.o. female who is here for  evaluation and management of breast cancer. We are joined by her son. The patient's last visit with [place] was on 01/02/2019. The pt reports that she is doing well overall.  The pt reports that she has not had any issues taking Letrozole and denies any hot flashes. She has continued to take the Letrozole as prescribed. Pt is currently using 2 liters of oxygen per week. She has been getting an average of 300 mL of fluid when she drains her lungs, which she has been doing about twice per week. She does not currently have a pulse oximeter. Pt denies any increased SOB or trouble breathing directly before draining her fluid. She is concerned that she is not able to walk as far as she would like but has continued to try and walk as much as possible in her home. Pt does have some leg swelling despite continuing to take her Lasix. She is interested in being referred to a Pulmonologist to see what she can do to minimize her oxygen therapy.    Pt has been having some difficulty sleeping at night and regularly naps during the day. She has continued to take Mirtazapine at night, but is not helping her fall asleep. Her anxiety has been manageable during this time. She has been eating well and has increased her food intake. Pt has been to see her PCP in the interim.   She reports that her breast wound has improved and is no longer having discharge from that area.   Lab results today (02/03/19) of CBC w/diff and CMP is as follows: all values are WNL except for Hgb at 10.8, MCHC at 29.2, Chloride  at 92, CO2 at 39, Glucose at 131, Albumin at 3.1, AST at 14, Alkaline Phosphatase at 134, Total Bilirubin at 0.2, . 02/03/2019 CA 15-3 is in progress 02/03/2019 CA 27.29 is in progress  On review of systems, pt reports SOB, leg swelling, eating well, sleeplessness and denies hot flashes, bone pain, back pain, headaches, abdominal pain, bowel movement issues and any other symptoms.   MEDICAL HISTORY:  Past Medical History:    Diagnosis Date  . Anemia   . Cancer (HCC)    Breast   . Chronic diastolic CHF (congestive heart failure) ([place])   . CKD (chronic kidney disease), stage III   . First degree AV block   . GERD (gastroesophageal reflux disease)   . Hypertension   . Malignant pleural effusion   . Metastatic breast cancer ([place])   . PAF (paroxysmal atrial fibrillation) ([place])   . PSVT (paroxysmal supraventricular tachycardia) ([place])   . Tachy-brady syndrome ([place])     SURGICAL HISTORY: Past Surgical History:  Procedure Laterality Date  . CHOLECYSTECTOMY    . IR PERC PLEURAL DRAIN W/INDWELL CATH W/IMG GUIDE  11/08/2018  . IR THORACENTESIS ASP PLEURAL SPACE W/IMG GUIDE  10/22/2018  . IR THORACENTESIS ASP PLEURAL SPACE W/IMG GUIDE  12/25/2018    SOCIAL HISTORY: Social History   Socioeconomic History  . Marital status: Widowed    Spouse name: Not on file  . Number of children: Not on file  . Years of education: Not on file  . Highest education level: Not on file  Occupational History  . Not on file  Tobacco Use  . Smoking status: Never Smoker  . Smokeless tobacco: Never Used  Substance and Sexual Activity  . Alcohol use: Never  . Drug use: Never  . Sexual activity: Not on file  Other Topics Concern  . Not on file  Social History Narrative  . Not on file   Social Determinants of Health   Financial Resource Strain:   . Difficulty of Paying Living Expenses: Not on file  Food Insecurity:   . Worried About Charity fundraiser in the Last Year: Not on file  . Ran Out of Food in the Last Year: Not on file  Transportation Needs:   . Lack of Transportation (Medical): Not on file  . Lack of Transportation (Non-Medical): Not on file  Physical Activity:   . Days of Exercise per Week: Not on file  . Minutes of Exercise per Session: Not on file  Stress:   . Feeling of Stress : Not on file  Social Connections:   . Frequency of Communication with Friends and Family: Not on file  . Frequency of  Social Gatherings with Friends and Family: Not on file  . Attends Religious Services: Not on file  . Active Member of Clubs or Organizations: Not on file  . Attends Archivist Meetings: Not on file  . Marital Status: Not on file  Intimate Partner Violence:   . Fear of Current or Ex-Partner: Not on file  . Emotionally Abused: Not on file  . Physically Abused: Not on file  . Sexually Abused: Not on file    FAMILY HISTORY: Family History  Problem Relation Age of Onset  . Diabetes Mellitus II Other        son    ALLERGIES:  has No Known Allergies.  MEDICATIONS:  Current Outpatient Medications  Medication Sig Dispense Refill  . acetaminophen (TYLENOL) 500 MG tablet Take 1,000 mg by mouth every  6 (six) hours as needed.    . ALPRAZolam (XANAX) 0.5 MG tablet Take 0.5-1 tablets (0.25-0.5 mg total) by mouth 2 (two) times daily as needed for anxiety or sleep. 15 tablet 0  . amiodarone (PACERONE) 200 MG tablet Take 1 tablet (200 mg total) by mouth daily. 90 tablet 3  . apixaban (ELIQUIS) 5 MG TABS tablet Take 1 tablet (5 mg total) by mouth 2 (two) times daily. 60 tablet 1  . ergocalciferol (VITAMIN D2) 1.25 MG (50000 UT) capsule Take 1 capsule (50,000 Units total) by mouth once a week. 12 capsule 0  . furosemide (LASIX) 40 MG tablet Take 1 and 1/2 tabs daily to = 60 mg daily 135 tablet 3  . hydrOXYzine (ATARAX/VISTARIL) 25 MG tablet Take 1 tablet (25 mg total) by mouth 3 (three) times daily as needed for anxiety (for anxiety and insomnia/sleep). 30 tablet 2  . loperamide (IMODIUM) 2 MG capsule Take 1 capsule (2 mg total) by mouth as needed for diarrhea or loose stools. 30 capsule 0  . mirtazapine (REMERON) 15 MG tablet Take 1 tablet (15 mg total) by mouth at bedtime. 30 tablet 3  . ondansetron (ZOFRAN ODT) 4 MG disintegrating tablet Take 1 tablet (4 mg total) by mouth every 8 (eight) hours as needed for nausea or vomiting. 20 tablet 0  . potassium chloride (KLOR-CON) 10 MEQ tablet  Take 2 tablets (20 mEq total) by mouth daily. 180 tablet 3  . letrozole (FEMARA) 2.5 MG tablet Take 1 tablet (2.5 mg total) by mouth daily. 30 tablet 11   No current facility-administered medications for this visit.    REVIEW OF SYSTEMS:   A 10+ POINT REVIEW OF SYSTEMS WAS OBTAINED including neurology, dermatology, psychiatry, cardiac, respiratory, lymph, extremities, GI, GU, Musculoskeletal, constitutional, breasts, reproductive, HEENT.  All pertinent positives are noted in the HPI.  All others are negative.   PHYSICAL EXAMINATION: ECOG FS:2 - Symptomatic, <50% confined to bed  Vitals:   02/03/19 1030  BP: (!) 161/83  Pulse: 86  Resp: 18  Temp: 98.2 F (36.8 C)  SpO2: 100%   Wt Readings from Last 3 Encounters:  02/03/19 171 lb 9.6 oz (77.8 kg)  01/15/19 168 lb (76.2 kg)  01/10/19 173 lb (78.5 kg)   Body mass index is 28.56 kg/m.    GENERAL:alert, in no acute distress and comfortable SKIN: no acute rashes, breast lesion appears to have shrunk. open area is now crusted with no active discharge, much improved from prior to treatment.  EYES: conjunctiva are pink and non-injected, sclera anicteric OROPHARYNX: MMM, no exudates, no oropharyngeal erythema or ulceration NECK: supple, no JVD LYMPH:  no palpable lymphadenopathy in the cervical, axillary or inguinal regions LUNGS: clear to auscultation b/l with normal respiratory effort HEART: regular rate & rhythm ABDOMEN:  normoactive bowel sounds , non tender, not distended. No palpable hepatosplenomegaly.  Extremity: 1+ pedal edema PSYCH: alert & oriented x 3 with fluent speech NEURO: no focal motor/sensory deficits  LABORATORY DATA:  I have reviewed the data as listed  . CBC Latest Ref Rng & Units 02/03/2019 01/02/2019 12/31/2018  WBC 4.0 - 10.5 K/uL 7.5 6.9 5.0  Hemoglobin 12.0 - 15.0 g/dL 10.8(L) 10.6(L) 9.8(L)  Hematocrit 36.0 - 46.0 % 37.0 35.4(L) 33.1(L)  Platelets 150 - 400 K/uL 323 319 295    . CMP Latest Ref Rng &  Units 02/03/2019 01/02/2019 12/31/2018  Glucose 70 - 99 mg/dL 131(H) 114(H) 105(H)  BUN 8 - 23 mg/dL 15 11 6(L)  Creatinine 0.44 - 1.00 mg/dL 0.84 1.08(H) 0.84  Sodium 135 - 145 mmol/L 142 140 142  Potassium 3.5 - 5.1 mmol/L 3.9 3.9 3.2(L)  Chloride 98 - 111 mmol/L 92(L) 96(L) 97(L)  CO2 22 - 32 mmol/L 39(H) 31 30  Calcium 8.9 - 10.3 mg/dL 8.9 8.6(L) 8.5(L)  Total Protein 6.5 - 8.1 g/dL 6.8 6.8 -  Total Bilirubin 0.3 - 1.2 mg/dL 0.2(L) 1.1 -  Alkaline Phos 38 - 126 U/L 134(H) 100 -  AST 15 - 41 U/L 14(L) 26 -  ALT 0 - 44 U/L 13 28 -  12/28/2018 DG Chest [place] 1 View (Accession [id number])      12/26/2018 DG Chest 2 View (Accession [id number])  12/25/2018 DG Chest 1 View (Accession [id number])  12/25/2018 DG Chest 2 View (Accession [id number])  10/16/2018 Surgical Pathology    10/16/2018 Surgical Pathology    RADIOGRAPHIC STUDIES: I have personally reviewed the radiological images as listed and agreed with the findings in the report. No results found.  ASSESSMENT & PLAN:   #1 Rt breast metastastic cancer ERE/PR+, Her2 neg  10/14/2018 CT Chest ([id number])  revealed "1. No convincing pneumonia. 2. Moderate left and small right pleural effusions. There is mild bilateral interstitial thickening most evident in the lower lungs. This suggests mild interstitial edema. 3. Dependent atelectasis in the lower lobes, left greater than right, with milder atelectasis in the left upper lobe. 4. Small lung nodules most evident in the right middle lobe. 5. Abnormal soft tissue contiguous with the skin of the medial right breast. Although this may be infectious/inflammatory, neoplastic disease should be considered. Consider follow-up diagnostic mammography and possible right breast ultrasound for further assessment."  10/16/2018 CT Angio Chest ([id number])  revealed "1. Technically adequate exam showing no acute pulmonary embolus. 2. Small LEFT pneumothorax, estimated to be 5-10% of lung volume. 3.  Small to moderate LEFT pleural effusion and associated Atelectasis. 4. Trace RIGHT pleural effusion and basilar atelectasis. 5. Cardiomegaly and coronary artery disease. 6. RIGHT breast skin thickening and irregularity suspicious for malignancy. Recommend diagnostic mammography and ultrasound when the patient is able. 7. Small bilateral axillary lymph nodes, 1 centimeter in diameter. 8. Aortic Atherosclerosis (ICD10-I70.0)."  10/16/2018 Surgical Pathology of right breast excision reveals "Metastatic Carcinoma. Tumor cells are NEGATIVE for Her2".  10/21/2018 MRI Brain ([id number]) revealed "Negative MRI brain with contrast. Negative for metastatic disease."  10/21/2018 CT Abd/Pel ([id number]) revealed "1. Large malignant left-sided effusion. Trace effusion on the right with basilar atelectasis. 2. Variation in parenchymal enhancement raising the question of hilar lesion in the left chest, consider CT chest if not performed for further evaluation. 3. 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 4. Endometrial thickening may represent a second primary, consider endovaginal sonogram for further assessment as warranted. 5. Question of added enhancement in these central canal along the spinal cord internal nerve roots. This could be seen in the setting of CNS involvement but is not certain. MRI of the spine for additional staging could be performed as warranted. 6. Area of vague hypoattenuation anterior and inferior to portal structures in medial section left hepatic lobe likely an area of fatty infiltration, attention on follow-up or assess with MR for definitive characterization."  #2 Malignant Pleural effusion moderate left and small right-sided pleural effusion with shortness of breath. 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  Pneumothorax  status post thoracentesis  #3  Congestive heart failure #4 Atrial fibrillation with RVR on therapeutic anticoagulation #5 CKD #6 hypertension #7 coag negative staph on blood cultures thought to be contaminant  PLAN:  -Discussed pt labwork today, 02/03/19; blood counts stable, Hgb is okay, blood chemistries are steady  -Discussed 02/03/2019 CA 15-3 is in progress -Discussed 02/03/2019 CA 27.29 is in progress -Recommended pt get a Pulse Oximeter for home use -Breast wound has improved upon clinical exam -The pt has no prohibitive toxicities from continuing Letrozole at this time -Continue holding Verzinio -Will repeat scans in 3 months  -Will continue Letrozole for 3 months until repeat scans, will then reassess treatment plan -Will refer pt to [facility] for management of malignant pleural effusion -Will give first Zometa injection today -Refill Vitamin D2, Letrozole -Will see back in 6 weeks with labs   FOLLOW UP: Zometa first dose today Please schedule next dose in 4 weeks from today instead of 02/26/2019. Return to clinic with Dr. [name] with labs in 6 weeks Referral to [place] [facility] for management of malignant pleural effusion   The total time spent in the appt was 30 minutes and more than 50% was on counseling and direct patient cares.  All of the patient's questions were answered with apparent satisfaction. The patient knows to call the clinic with any problems, questions or concerns.    Sullivan Lone MD [place] AAHIVMS [facility] Hematology/Oncology Physician [facility]  (Office):       [phone] (Work cell):  [phone] (Fax):           [phone]  02/03/2019 11:22 AM  I, Yevette Edwards, am acting as a scribe for Dr. Sullivan Lone.   .I have reviewed the above documentation for accuracy and completeness, and I agree with the above. Brunetta Genera MD

## 2019-02-04 ENCOUNTER — Ambulatory Visit: Payer: Medicare HMO | Admitting: Physician Assistant

## 2019-02-04 LAB — CANCER ANTIGEN 15-3: CA 15-3: 18.5 U/mL (ref 0.0–25.0)

## 2019-02-04 LAB — CANCER ANTIGEN 27.29: CA 27.29: 19.7 U/mL (ref 0.0–38.6)

## 2019-02-05 ENCOUNTER — Telehealth: Payer: Self-pay | Admitting: Hematology

## 2019-02-05 NOTE — Telephone Encounter (Signed)
Scheduled per los. Called and left msg. Mailed printout  °

## 2019-02-07 ENCOUNTER — Telehealth: Payer: Self-pay | Admitting: Cardiology

## 2019-02-07 NOTE — Telephone Encounter (Signed)
Katie from [employer] is calling to stated that the prior authorization for this patients monitor has been denied. States that the MD has not responded to peer to peer review and they have yet to receive medical records.

## 2019-02-07 NOTE — Telephone Encounter (Signed)
I will send to [place] for further advice.

## 2019-02-19 ENCOUNTER — Emergency Department (HOSPITAL_COMMUNITY)
Admission: EM | Admit: 2019-02-19 | Discharge: 2019-02-19 | Disposition: A | Discharge status: Home / Self Care | Payer: Medicare HMO | Attending: Emergency Medicine | Admitting: Emergency Medicine

## 2019-02-19 ENCOUNTER — Emergency Department (HOSPITAL_COMMUNITY): Payer: Medicare HMO

## 2019-02-19 ENCOUNTER — Encounter (HOSPITAL_COMMUNITY): Payer: Self-pay | Admitting: Emergency Medicine

## 2019-02-19 ENCOUNTER — Other Ambulatory Visit: Payer: Self-pay

## 2019-02-19 DIAGNOSIS — R600 Localized edema: Secondary | ICD-10-CM | POA: Insufficient documentation

## 2019-02-19 DIAGNOSIS — I5032 Chronic diastolic (congestive) heart failure: Secondary | ICD-10-CM | POA: Insufficient documentation

## 2019-02-19 DIAGNOSIS — N183 Chronic kidney disease, stage 3 unspecified: Secondary | ICD-10-CM | POA: Insufficient documentation

## 2019-02-19 DIAGNOSIS — Z20822 Contact with and (suspected) exposure to covid-19: Secondary | ICD-10-CM | POA: Insufficient documentation

## 2019-02-19 DIAGNOSIS — R002 Palpitations: Secondary | ICD-10-CM

## 2019-02-19 DIAGNOSIS — I130 Hypertensive heart and chronic kidney disease with heart failure and stage 1 through stage 4 chronic kidney disease, or unspecified chronic kidney disease: Secondary | ICD-10-CM | POA: Insufficient documentation

## 2019-02-19 DIAGNOSIS — Z79899 Other long term (current) drug therapy: Secondary | ICD-10-CM | POA: Insufficient documentation

## 2019-02-19 DIAGNOSIS — R0609 Other forms of dyspnea: Secondary | ICD-10-CM | POA: Diagnosis not present

## 2019-02-19 DIAGNOSIS — Z853 Personal history of malignant neoplasm of breast: Secondary | ICD-10-CM | POA: Insufficient documentation

## 2019-02-19 DIAGNOSIS — Z7901 Long term (current) use of anticoagulants: Secondary | ICD-10-CM | POA: Diagnosis not present

## 2019-02-19 LAB — CBC
HCT: 41 % (ref 36.0–46.0)
Hemoglobin: 11.6 g/dL — ABNORMAL LOW (ref 12.0–15.0)
MCH: 27 pg (ref 26.0–34.0)
MCHC: 28.3 g/dL — ABNORMAL LOW (ref 30.0–36.0)
MCV: 95.3 fL (ref 80.0–100.0)
Platelets: 341 10*3/uL (ref 150–400)
RBC: 4.3 MIL/uL (ref 3.87–5.11)
RDW: 14.7 % (ref 11.5–15.5)
WBC: 8.2 10*3/uL (ref 4.0–10.5)
nRBC: 0 % (ref 0.0–0.2)

## 2019-02-19 LAB — RESPIRATORY PANEL BY RT PCR (FLU A&B, COVID)
Influenza A by PCR: NEGATIVE
Influenza B by PCR: NEGATIVE
SARS Coronavirus 2 by RT PCR: NEGATIVE

## 2019-02-19 LAB — BASIC METABOLIC PANEL
Anion gap: 12 (ref 5–15)
BUN: 10 mg/dL (ref 8–23)
CO2: 37 mmol/L — ABNORMAL HIGH (ref 22–32)
Calcium: 9 mg/dL (ref 8.9–10.3)
Chloride: 92 mmol/L — ABNORMAL LOW (ref 98–111)
Creatinine, Ser: 0.81 mg/dL (ref 0.44–1.00)
GFR calc Af Amer: >60 mL/min (ref >60)
GFR calc non Af Amer: >60 mL/min (ref >60)
Glucose, Bld: 104 mg/dL — ABNORMAL HIGH (ref 70–99)
Potassium: 4.4 mmol/L (ref 3.5–5.1)
Sodium: 141 mmol/L (ref 135–145)

## 2019-02-19 LAB — BRAIN NATRIURETIC PEPTIDE: B Natriuretic Peptide: 64.6 pg/mL (ref 0.0–100.0)

## 2019-02-19 LAB — TROPONIN I (HIGH SENSITIVITY)
Troponin I (High Sensitivity): 11 ng/L (ref <18)
Troponin I (High Sensitivity): 10 ng/L (ref <18)

## 2019-02-19 LAB — MAGNESIUM: Magnesium: 2.3 mg/dL (ref 1.7–2.4)

## 2019-02-19 MED ORDER — SODIUM CHLORIDE 0.9% FLUSH
3.0000 mL | Freq: Once | INTRAVENOUS | Status: AC
  Administered 2019-02-19: 3 mL via INTRAVENOUS

## 2019-02-19 MED ORDER — FUROSEMIDE 10 MG/ML IJ SOLN
60.0000 mg | Freq: Once | INTRAMUSCULAR | Status: AC
  Administered 2019-02-19: 60 mg via INTRAMUSCULAR
  Filled 2019-02-19: qty 6

## 2019-02-19 NOTE — ED Provider Notes (Signed)
[facility] EMERGENCY DEPARTMENT Provider Note   CSN: [id number] Arrival date & time: 02/19/19  1402     History Chief Complaint  Patient presents with  . Shortness of Breath    Beverly Tucker is a 79 y.o. female.  HPI   79 year old female with palpitations and dyspnea.  Onset yesterday.  She reports heart racing with mild activity which is unusual for her.  Mild shortness of breath.  She is normally on 2 L of oxygen at baseline.  She also reports increased lower extremity edema over the last several days to a week.  No fevers or chills.  No cough.  No acute pain.  She reports compliance with her medications.  Past Medical History:  Diagnosis Date  . Anemia   . Cancer (HCC)    Breast   . Chronic diastolic CHF (congestive heart failure) ([place])   . CKD (chronic kidney disease), stage III   . First degree AV block   . GERD (gastroesophageal reflux disease)   . Hypertension   . Malignant pleural effusion   . Metastatic breast cancer ([place])   . PAF (paroxysmal atrial fibrillation) ([place])   . PSVT (paroxysmal supraventricular tachycardia) ([place])   . Tachy-brady syndrome [facility])    Patient Active Problem List   Diagnosis Date Noted  . Goals of care, counseling/discussion   . Palliative care by specialist   . Encounter for hospice care discussion   . UTI (urinary tract infection) 12/25/2018  . Generalized weakness 12/25/2018  . Hypokalemia   . Community acquired pneumonia   . Hyperkalemia   . Gastroesophageal reflux disease without esophagitis   . Malnutrition of moderate degree 11/18/2018  . PSVT (paroxysmal supraventricular tachycardia) ([place]) 11/16/2018  . Chronic diastolic HF (heart failure) ([place]) 11/16/2018  . Elevated troponin 11/16/2018  . Leukocytosis 11/16/2018  . Atrial fibrillation, chronic ([place]) 11/16/2018  . Malignant pleural effusion   . 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 11/06/2018  . AKI (acute kidney injury) ([place]) 11/06/2018  . AF  (paroxysmal atrial fibrillation) ([place]) 11/06/2018  . Pleural effusion 11/06/2018  . Bone metastases ([place]) 11/04/2018  . Metastatic breast cancer ([place])   . Pneumothorax   . Gram-positive bacteremia   . Mass of breast, right 10/15/2018  . Acute CHF (congestive heart failure) ([place]) 10/15/2018  . Shortness of breath 10/14/2018  . CKD (chronic kidney disease) stage 3, GFR 30-59 ml/min 02/06/2018  . HTN (hypertension) 12/01/2015   Past Surgical History:  Procedure Laterality Date  . CHOLECYSTECTOMY    . IR PERC PLEURAL DRAIN W/INDWELL CATH W/IMG GUIDE  11/08/2018  . IR THORACENTESIS ASP PLEURAL SPACE W/IMG GUIDE  10/22/2018  . IR THORACENTESIS ASP PLEURAL SPACE W/IMG GUIDE  12/25/2018    OB History   No obstetric history on file.    Family History  Problem Relation Age of Onset  . Diabetes Mellitus II Other        son   Social History   Tobacco Use  . Smoking status: Never Smoker  . Smokeless tobacco: Never Used  Substance Use Topics  . Alcohol use: Never  . Drug use: Never   Home Medications Prior to Admission medications   Medication Sig Start Date End Date Taking? Authorizing Provider  acetaminophen (TYLENOL) 500 MG tablet Take 1,000 mg by mouth every 6 (six) hours as needed.    [provider]  ALPRAZolam Duanne Moron) 0.5 MG tablet Take 0.5-1 tablets (0.25-0.5 mg total) by mouth 2 (two) times daily as needed  for anxiety or sleep. 12/31/18   Roxan Hockey, MD  amiodarone (PACERONE) 200 MG tablet Take 1 tablet (200 mg total) by mouth daily. 01/10/19   Dunn, Nedra Hai, PA-C  apixaban (ELIQUIS) 5 MG TABS tablet Take 1 tablet (5 mg total) by mouth 2 (two) times daily. 10/23/18   Jeanmarie Hubert, MD  clotrimazole (LOTRIMIN) 1 % cream  01/17/19   [provider]  ergocalciferol (VITAMIN D2) 1.25 MG (50000 UT) capsule Take 1 capsule (50,000 Units total) by mouth once a week. 02/03/19   Brunetta Genera, MD  furosemide (LASIX) 40 MG tablet Take 1 and 1/2 tabs daily to  = 60 mg daily 01/10/19   Dunn, Nedra Hai, PA-C  hydrOXYzine (ATARAX/VISTARIL) 25 MG tablet Take 1 tablet (25 mg total) by mouth 3 (three) times daily as needed for anxiety (for anxiety and insomnia/sleep). 12/31/18   Roxan Hockey, MD  letrozole (FEMARA) 2.5 MG tablet Take 1 tablet (2.5 mg total) by mouth daily. 02/03/19   Brunetta Genera, MD  loperamide (IMODIUM) 2 MG capsule Take 1 capsule (2 mg total) by mouth as needed for diarrhea or loose stools. 11/24/18   Dessa Phi, DO  mirtazapine (REMERON) 15 MG tablet Take 1 tablet (15 mg total) by mouth at bedtime. 12/31/18   Roxan Hockey, MD  ondansetron (ZOFRAN ODT) 4 MG disintegrating tablet Take 1 tablet (4 mg total) by mouth every 8 (eight) hours as needed for nausea or vomiting. 12/20/18   Donne Hazel, MD  potassium chloride (KLOR-CON) 10 MEQ tablet Take 2 tablets (20 mEq total) by mouth daily. 01/10/19   Charlie Pitter, PA-C    Allergies    Patient has no known allergies.  Review of Systems   Review of Systems All systems reviewed and negative, other than as noted in HPI.  Physical Exam Updated Vital Signs BP (!) 137/92   Pulse 78   Temp 98.4 F (36.9 C) (Oral)   Resp (!) 30   SpO2 100%   Physical Exam Vitals and nursing note reviewed.  Constitutional:      General: She is not in acute distress.    Appearance: She is well-developed.  HENT:     Head: Normocephalic and atraumatic.  Eyes:     General:        Right eye: No discharge.        Left eye: No discharge.     Conjunctiva/sclera: Conjunctivae normal.  Cardiovascular:     Rate and Rhythm: Normal rate and regular rhythm.     Heart sounds: Normal heart sounds. No murmur. No friction rub. No gallop.   Pulmonary:     Effort: Pulmonary effort is normal. No respiratory distress.     Breath sounds: Normal breath sounds.     Comments: Mild tachypnea.  Lungs sound clear.  Pleural catheter left chest.  Clean/dry dandage over anterior chest wall. Abdominal:      General: There is no distension.     Palpations: Abdomen is soft.     Tenderness: There is no abdominal tenderness.  Musculoskeletal:        General: No tenderness.     Cervical back: Neck supple.     Right lower leg: Edema present.     Left lower leg: Edema present.     Comments: Moderate symmetric pitting lower extremity edema.  Skin:    General: Skin is warm and dry.  Neurological:     Mental Status: She is alert.  Psychiatric:  Behavior: Behavior normal.        Thought Content: Thought content normal.    ED Results / Procedures / Treatments   Labs (all labs ordered are listed, but only abnormal results are displayed) Labs Reviewed  BASIC METABOLIC PANEL - Abnormal; Notable for the following components:      Result Value   Chloride 92 (*)    CO2 37 (*)    Glucose, Bld 104 (*)    All other components within normal limits  CBC - Abnormal; Notable for the following components:   Hemoglobin 11.6 (*)    MCHC 28.3 (*)    All other components within normal limits  RESPIRATORY PANEL BY RT PCR (FLU A&B, COVID)  BRAIN NATRIURETIC PEPTIDE  MAGNESIUM  TROPONIN I (HIGH SENSITIVITY)  TROPONIN I (HIGH SENSITIVITY)   EKG EKG Interpretation  Date/Time:  Wednesday February 19 2019 14:20:34 EST Ventricular Rate:  80 PR Interval:  210 QRS Duration: 92 QT Interval:  404 QTC Calculation: 465 R Axis:   -19 Text Interpretation: Sinus rhythm with 1st degree A-V block Otherwise normal ECG Confirmed by Virgel Manifold [phone]) on 02/19/2019 6:14:12 PM   Radiology DG Chest Portable 1 View  Result Date: 02/19/2019 CLINICAL DATA:  Dyspnea, history CHF, breast cancer, hypertension, atrial fibrillation EXAM: PORTABLE CHEST 1 VIEW COMPARISON:  Portable exam 1836 hours compared to 12/28/2018 FINDINGS: LEFT PleurX catheter noted. Enlargement of cardiac silhouette. Atherosclerotic calcification aorta. Bibasilar pleural effusions and atelectasis increased on RIGHT. Upper lungs clear. No  pneumothorax. Bones demineralized. IMPRESSION: Bibasilar pleural effusions atelectasis increased on RIGHT. Electronically Signed   By: Lavonia Dana M.D.   On: 02/19/2019 19:24    Procedures Procedures (including critical care time)  Medications Ordered in ED Medications  sodium chloride flush (NS) 0.9 % injection 3 mL (3 mLs Intravenous Given 02/19/19 1723)    ED Course  I have reviewed the triage vital signs and the nursing notes.  Pertinent labs & imaging results that were available during my care of the patient were reviewed by me and considered in my medical decision making (see chart for details).    MDM Rules/Calculators/A&P                      78yF with palpitations. Sinus rhythm. HD stable. ED w/u fairly unrevealing.I doubt emergent process.   Final Clinical Impression(s) / ED Diagnoses Final diagnoses:  Palpitations    Rx / DC Orders ED Discharge Orders    None       Virgel Manifold, MD 02/21/19 1540

## 2019-02-19 NOTE — ED Notes (Signed)
Patient verbalizes understanding of discharge instructions. Opportunity for questioning and answers were provided. Armband removed by staff, pt discharged from ED to home with son. Son confirms O2 available for transport home

## 2019-02-19 NOTE — ED Notes (Signed)
RESTRICTION band placed on pt's right wrist. 

## 2019-02-19 NOTE — ED Triage Notes (Signed)
Pt to triage via [facility] from home.  Reports SOB since Monday, malaise, unable to sleep last night due to SOB, and BLE edema.  Pt 95% on her 2 liters of home O2.  EMS increased her to 4 liters and she is 100%.  EMS reported temp was 100.4 PTA.

## 2019-02-20 ENCOUNTER — Telehealth: Payer: Self-pay | Admitting: Physician Assistant

## 2019-02-20 NOTE — Telephone Encounter (Addendum)
Left message to call back   Patient can have a visitor for cognitive/physical impairment. Appt note updated.

## 2019-02-20 NOTE — Telephone Encounter (Signed)
[name], Son of the patient wanted to know if he is able to come with his Mom to her appointment on Tuesday, She is Wheelchair bound, and while she could be by herself, sometimes she has a hard time understanding what doctors have to say. She may not be able to tell her son what is discussed at the appointment.   Please call the son to let him know what the office decides

## 2019-02-24 NOTE — Progress Notes (Signed)
Cardiology Office Note    Date:  02/25/2019   ID:  Beverly Tucker, DOB 1940-03-19, MRN [id number]  PCP:  Beverly Sacramento, MD  Cardiologist: Beverly Dawley, MD EPS: None  Chief Complaint  Patient presents with  . Follow-up    History of Present Illness:  Beverly Tucker is a 79 y.o. female with history of chronic diastolic CHF, PAF [id number] equals 5 on Eliquis, paroxysmal SVT started on amiodarone 12/2018, metastatic breast cancer with history of recurrent malignant pleural effusion status post Pleurx catheter, hypertension, CKD stage III, GERD.  Patient initially had SVT in the setting of decompensated CHF 10/2018 treated with adenosine did with SVT in the setting of pneumonia 12/2018 placed on amiodarone.  Metoprolol initially held due to bradycardia but because of breakthrough SVT was restarted.  Some concern for high degree AV block and her beta-blocker was stopped.  Patient had telemedicine visit with  Beverly Dopp, PA-C 01/15/2019 at which time she had increased her Lasix to 100 mg daily instead of 60 as instructed.  No improvement in her leg swelling.  He instructed her to reduce her Lasix to 60 mg daily and was trying to track down to be met.  She needed in office evaluation.  He also ordered a Zio patch  Patient went to the ER 02/19/2019 with palpitations but no arrhythmias identified.  BNP was 64 troponin negative magnesium 2.3 K 4.4 creatinine 0.81 hemoglobin 11.6 EKG very poor baseline but looks like normal sinus rhythm with first-degree AV block chest x-ray stable.  Patient comes in accompanied by her son. She's on O2. She complains of dyspnea on exertion and heart racing. Still gettting 600 cc out of Pleurx every 8 days.   Past Medical History:  Diagnosis Date  . Anemia   . Cancer (HCC)    Breast   . Chronic diastolic CHF (congestive heart failure) (Beverly Tucker)   . CKD (chronic kidney disease), stage III   . First degree AV block   . GERD (gastroesophageal reflux disease)     . Hypertension   . Malignant pleural effusion   . Metastatic breast cancer (Beverly Tucker)   . PAF (paroxysmal atrial fibrillation) (Beverly Tucker)   . PSVT (paroxysmal supraventricular tachycardia) (Beverly Tucker)   . Tachy-brady syndrome [facility])     Past Surgical History:  Procedure Laterality Date  . CHOLECYSTECTOMY    . IR PERC PLEURAL DRAIN W/INDWELL CATH W/IMG GUIDE  11/08/2018  . IR THORACENTESIS ASP PLEURAL SPACE W/IMG GUIDE  10/22/2018  . IR THORACENTESIS ASP PLEURAL SPACE W/IMG GUIDE  12/25/2018    Current Medications: Current Meds  Medication Sig  . acetaminophen (TYLENOL) 500 MG tablet Take 1,000 mg by mouth every 6 (six) hours as needed.  . ALPRAZolam (XANAX) 0.5 MG tablet Take 0.5-1 tablets (0.25-0.5 mg total) by mouth 2 (two) times daily as needed for anxiety or sleep.  Marland Kitchen amiodarone (PACERONE) 200 MG tablet Take 1 tablet (200 mg total) by mouth daily.  Marland Kitchen apixaban (ELIQUIS) 5 MG TABS tablet Take 1 tablet (5 mg total) by mouth 2 (two) times daily.  . ergocalciferol (VITAMIN D2) 1.25 MG (50000 UT) capsule Take 1 capsule (50,000 Units total) by mouth once a week.  . furosemide (LASIX) 40 MG tablet Take 1 and 1/2 tabs daily to = 60 mg daily  . hydrOXYzine (ATARAX/VISTARIL) 25 MG tablet Take 1 tablet (25 mg total) by mouth 3 (three) times daily as needed for anxiety (for anxiety and insomnia/sleep).  Marland Kitchen letrozole (FEMARA) 2.5 MG  tablet Take 1 tablet (2.5 mg total) by mouth daily.  Marland Kitchen loperamide (IMODIUM) 2 MG capsule Take 1 capsule (2 mg total) by mouth as needed for diarrhea or loose stools.  . mirtazapine (REMERON) 15 MG tablet Take 1 tablet (15 mg total) by mouth at bedtime.  . ondansetron (ZOFRAN ODT) 4 MG disintegrating tablet Take 1 tablet (4 mg total) by mouth every 8 (eight) hours as needed for nausea or vomiting.  . potassium chloride (KLOR-CON) 10 MEQ tablet Take 2 tablets (20 mEq total) by mouth daily.     Allergies:   Patient has no known allergies.   Social History   Socioeconomic History   . Marital status: Widowed    Spouse name: Not on file  . Number of children: Not on file  . Years of education: Not on file  . Highest education level: Not on file  Occupational History  . Not on file  Tobacco Use  . Smoking status: Never Smoker  . Smokeless tobacco: Never Used  Substance and Sexual Activity  . Alcohol use: Never  . Drug use: Never  . Sexual activity: Not on file  Other Topics Concern  . Not on file  Social History Narrative  . Not on file   Social Determinants of Health   Financial Resource Strain:   . Difficulty of Paying Living Expenses: Not on file  Food Insecurity:   . Worried About Charity fundraiser in the Last Year: Not on file  . Ran Out of Food in the Last Year: Not on file  Transportation Needs:   . Lack of Transportation (Medical): Not on file  . Lack of Transportation (Non-Medical): Not on file  Physical Activity:   . Days of Exercise per Week: Not on file  . Minutes of Exercise per Session: Not on file  Stress:   . Feeling of Stress : Not on file  Social Connections:   . Frequency of Communication with Friends and Family: Not on file  . Frequency of Social Gatherings with Friends and Family: Not on file  . Attends Religious Services: Not on file  . Active Member of Clubs or Organizations: Not on file  . Attends Archivist Meetings: Not on file  . Marital Status: Not on file     Family History:  The patient's family history includes Diabetes Mellitus II in an other family member.   ROS:   Please see the history of present illness.    ROS All other systems reviewed and are negative.   PHYSICAL EXAM:   VS:  BP 140/70   Pulse 82   Ht 5' 5" (1.651 m)   Wt 171 lb (77.6 kg)   SpO2 98%   BMI 28.46 kg/m   Physical Exam  GEN: Well nourished, well developed, in no acute distress  Neck: no JVD, carotid bruits, or masses Cardiac:RRR; no murmurs, rubs, or gallops  Respiratory: Decreased breath sounds at the left lung and the  right lower base  GI: soft, nontender, nondistended, + BS Ext: +1 edema above her ankles bilaterally  Neuro:  Alert and Oriented x 3 Psych: euthymic mood, full affect  Wt Readings from Last 3 Encounters:  02/25/19 171 lb (77.6 kg)  02/03/19 171 lb 9.6 oz (77.8 kg)  01/15/19 168 lb (76.2 kg)      Studies/Labs Reviewed:   EKG:  EKG is not ordered today.  The ekg reviewed from the ER 02/19/2019 normal sinus rhythm with first-degree AV block but  very poor baseline tracing  recent Labs: 11/16/2018: TSH 2.545 02/03/2019: ALT 13 02/19/2019: B Natriuretic Peptide 64.6; BUN 10; Creatinine, Ser 0.81; Hemoglobin 11.6; Magnesium 2.3; Platelets 341; Potassium 4.4; Sodium 141   Lipid Panel    Component Value Date/Time   CHOL 165 11/17/2018 0031   TRIG 132 11/17/2018 0031   HDL 36 (L) 11/17/2018 0031   CHOLHDL 4.6 11/17/2018 0031   VLDL 26 11/17/2018 0031   LDLCALC 103 (H) 11/17/2018 0031    Additional studies/ records that were reviewed today include:  It echo 11/15/2020IMPRESSIONS      1. Left ventricular ejection fraction, by visual estimation, is 60 to 65%. The left ventricle has normal function. There is no left ventricular hypertrophy.  2. Left ventricular diastolic parameters are consistent with Grade I diastolic dysfunction (impaired relaxation).  3. Global right ventricle has normal systolic function.The right ventricular size is normal. No increase in right ventricular wall thickness.  4. Left atrial size was normal.  5. Right atrial size was normal.  6. Severe mitral annular calcification.  7. The mitral valve is normal in structure. Trace mitral valve regurgitation. No evidence of mitral stenosis.  8. The tricuspid valve is normal in structure. Tricuspid valve regurgitation is mild.  9. The aortic valve is normal in structure. Aortic valve regurgitation is trivial. Mild aortic valve sclerosis without stenosis. 10. The pulmonic valve was normal in structure. Pulmonic valve  regurgitation is not visualized. 11. The inferior vena cava is normal in size with greater than 50% respiratory variability, suggesting right atrial pressure of 3 mmHg.   FINDINGS  Left Ventricle: Left ventricular ejection fraction, by visual estimation, is 60 to 65%. The left ventricle has normal function. There is no left ventricular hypertrophy. Left ventricular diastolic parameters are consistent with Grade I diastolic  dysfunction (impaired relaxation). Normal left atrial pressure.   Right Ventricle: The right ventricular size is normal. No increase in right ventricular wall thickness. Global RV systolic function is has normal systolic function.   Left Atrium: Left atrial size was normal in size.   Right Atrium: Right atrial size was normal in size   Pericardium: There is no evidence of pericardial effusion.   Mitral Valve: The mitral valve is normal in structure. There is mild thickening of the mitral valve leaflet(s). Severe mitral annular calcification. No evidence of mitral valve stenosis by observation. MV Area by PHT, 3.53 cm. MV PHT, 62.35 msec. Trace  mitral valve regurgitation.   Tricuspid Valve: The tricuspid valve is normal in structure. Tricuspid valve regurgitation is mild.   Aortic Valve: The aortic valve is normal in structure. Aortic valve regurgitation is trivial. Mild aortic valve sclerosis is present, with no evidence of aortic valve stenosis.   Pulmonic Valve: The pulmonic valve was normal in structure. Pulmonic valve regurgitation is not visualized.   Aorta: The aortic root, ascending aorta and aortic arch are all structurally normal, with no evidence of dilitation or obstruction.   Venous: The inferior vena cava is normal in size with greater than 50% respiratory variability, suggesting right atrial pressure of 3 mmHg.   Shunts: No ventricular septal defect is seen or detected. There is no evidence of an atrial septal defect. No atrial level shunt detected by  color flow Doppler.    Chest x-ray 1/20/2021IMPRESSION: Bibasilar pleural effusions atelectasis increased on RIGHT.     Electronically Signed   By: Lavonia Dana M.D.   On: 02/19/2019 19:24      ASSESSMENT:  1. Chronic diastolic CHF (congestive heart failure) (HCC)   2. Paroxysmal atrial fibrillation ([place])   3. SVT (supraventricular tachycardia) (HCC)   4. Stage 3a chronic kidney disease   5. Essential hypertension      PLAN:  In order of problems listed above:  Chronic diastolic CHF ER visit last week BNP was normal.  2D echo 12/15/2018 normal LVEF 60 to 65% with grade 1 DD in the ER last week BNP was normal.  Chest x-ray has bilateral pleural effusions and she has a Pleurx catheter in place.  No significant heart failure on exam today.  Mild ankle edema.  Suspect her dyspnea on exertion is related to her metastatic cancer and pleural effusions but need to rule out PSVT is contributing.  Monitor being placed today.  PAF CHA2DS2-VASc equals 5 on Eliquis no bleeding problems.  PSVT on amiodarone since 12/2018 has had some AV block on beta-blocker so this was stopped have given preventatives 14-day monitor today.  We will have her follow-up after monitor.  CKD stage III creatinine 0.81 on 02/18/2018  Essential hypertension blood pressure controlled    Medication Adjustments/Labs and Tests Ordered: Current medicines are reviewed at length with the patient today.  Concerns regarding medicines are outlined above.  Medication changes, Labs and Tests ordered today are listed in the Patient Instructions below. Patient Instructions  Medication Instructions:  Your physician recommends that you continue on your current medications as directed. Please refer to the Current Medication list given to you today.  Labwork: None ordered.  Testing/Procedures: Your physician has recommended that you wear an event monitor. Event monitors are medical devices that record the heart's  electrical activity. Doctors most often [place] these monitors to diagnose arrhythmias. Arrhythmias are problems with the speed or rhythm of the heartbeat. The monitor is a small, portable device. You can wear one while you do your normal daily activities. This is usually used to diagnose what is causing palpitations/syncope (passing out).   Follow-Up: Your physician recommends that you schedule a follow-up appointment      Any Other Special Instructions Will Be Listed Below (If Applicable).     If you need a refill on your cardiac medications before your next appointment, please call your pharmacy.      Signed, Ermalinda Barrios, PA-C  02/25/2019 1:59 PM    [facility] [address], [place], [place]  [address] Phone: [phone]; Fax: [phone]

## 2019-02-25 ENCOUNTER — Other Ambulatory Visit: Payer: Self-pay

## 2019-02-25 ENCOUNTER — Ambulatory Visit: Payer: Medicare HMO | Admitting: Physician Assistant

## 2019-02-25 ENCOUNTER — Other Ambulatory Visit: Payer: Self-pay | Admitting: *Deleted

## 2019-02-25 ENCOUNTER — Encounter: Payer: Self-pay | Admitting: Physician Assistant

## 2019-02-25 ENCOUNTER — Telehealth: Payer: Self-pay | Admitting: Hematology

## 2019-02-25 ENCOUNTER — Telehealth: Payer: Self-pay | Admitting: Radiology

## 2019-02-25 VITALS — BP 140/70 | HR 82 | Ht 65.0 in | Wt 171.0 lb

## 2019-02-25 DIAGNOSIS — I471 Supraventricular tachycardia: Secondary | ICD-10-CM | POA: Diagnosis not present

## 2019-02-25 DIAGNOSIS — N1831 Chronic kidney disease, stage 3a: Secondary | ICD-10-CM | POA: Diagnosis not present

## 2019-02-25 DIAGNOSIS — I480 Paroxysmal atrial fibrillation: Secondary | ICD-10-CM | POA: Diagnosis not present

## 2019-02-25 DIAGNOSIS — I5032 Chronic diastolic (congestive) heart failure: Secondary | ICD-10-CM | POA: Diagnosis not present

## 2019-02-25 DIAGNOSIS — I10 Essential (primary) hypertension: Secondary | ICD-10-CM

## 2019-02-25 DIAGNOSIS — C50919 Malignant neoplasm of unspecified site of unspecified female breast: Secondary | ICD-10-CM

## 2019-02-25 DIAGNOSIS — C7951 Secondary malignant neoplasm of bone: Secondary | ICD-10-CM

## 2019-02-25 NOTE — Telephone Encounter (Signed)
I left a message regarding lab 2/1

## 2019-02-25 NOTE — Patient Instructions (Addendum)
Medication Instructions:  Your physician recommends that you continue on your current medications as directed. Please refer to the Current Medication list given to you today.  Labwork: None ordered.  Testing/Procedures: Your physician has recommended that you wear an event monitor. Event monitors are medical devices that record the heart's electrical activity. Doctors most often [place] these monitors to diagnose arrhythmias. Arrhythmias are problems with the speed or rhythm of the heartbeat. The monitor is a small, portable device. You can wear one while you do your normal daily activities. This is usually used to diagnose what is causing palpitations/syncope (passing out).   Follow-Up: Your physician recommends that you schedule a follow-up appointment   March 9 at 1:15pm with Ermalinda Barrios for a virtual visit.    Any Other Special Instructions Will Be Listed Below (If Applicable).     If you need a refill on your cardiac medications before your next appointment, please call your pharmacy.

## 2019-02-25 NOTE — Telephone Encounter (Signed)
Enrolled patient for a 14 day [employer] Event monitor to be mailed to patients home. Brief instructions were gone over with the patients son.

## 2019-02-26 ENCOUNTER — Other Ambulatory Visit: Payer: Self-pay

## 2019-02-26 ENCOUNTER — Inpatient Hospital Stay: Payer: Medicare HMO

## 2019-02-28 ENCOUNTER — Encounter (INDEPENDENT_AMBULATORY_CARE_PROVIDER_SITE_OTHER): Payer: Medicare HMO

## 2019-02-28 DIAGNOSIS — I471 Supraventricular tachycardia: Secondary | ICD-10-CM | POA: Diagnosis not present

## 2019-02-28 DIAGNOSIS — I480 Paroxysmal atrial fibrillation: Secondary | ICD-10-CM | POA: Diagnosis not present

## 2019-03-03 ENCOUNTER — Ambulatory Visit: Payer: Medicare HMO

## 2019-03-03 ENCOUNTER — Inpatient Hospital Stay: Payer: Medicare HMO | Attending: Hematology

## 2019-03-03 ENCOUNTER — Inpatient Hospital Stay: Payer: Medicare HMO

## 2019-03-03 ENCOUNTER — Other Ambulatory Visit: Payer: Self-pay

## 2019-03-03 VITALS — BP 148/57 | HR 75 | Temp 98.9°F | Resp 18

## 2019-03-03 DIAGNOSIS — C50919 Malignant neoplasm of unspecified site of unspecified female breast: Secondary | ICD-10-CM

## 2019-03-03 DIAGNOSIS — C7951 Secondary malignant neoplasm of bone: Secondary | ICD-10-CM | POA: Insufficient documentation

## 2019-03-03 DIAGNOSIS — I129 Hypertensive chronic kidney disease with stage 1 through stage 4 chronic kidney disease, or unspecified chronic kidney disease: Secondary | ICD-10-CM | POA: Insufficient documentation

## 2019-03-03 DIAGNOSIS — N183 Chronic kidney disease, stage 3 unspecified: Secondary | ICD-10-CM | POA: Insufficient documentation

## 2019-03-03 DIAGNOSIS — N189 Chronic kidney disease, unspecified: Secondary | ICD-10-CM | POA: Insufficient documentation

## 2019-03-03 DIAGNOSIS — Z170 Estrogen receptor positive status [ER+]: Secondary | ICD-10-CM | POA: Insufficient documentation

## 2019-03-03 DIAGNOSIS — Z79811 Long term (current) use of aromatase inhibitors: Secondary | ICD-10-CM | POA: Insufficient documentation

## 2019-03-03 DIAGNOSIS — C50911 Malignant neoplasm of unspecified site of right female breast: Secondary | ICD-10-CM | POA: Diagnosis present

## 2019-03-03 LAB — BASIC METABOLIC PANEL - CANCER CENTER ONLY
Anion gap: 8 (ref 5–15)
BUN: 14 mg/dL (ref 8–23)
CO2: 42 mmol/L — ABNORMAL HIGH (ref 22–32)
Calcium: 8.9 mg/dL (ref 8.9–10.3)
Chloride: 93 mmol/L — ABNORMAL LOW (ref 98–111)
Creatinine: 0.81 mg/dL (ref 0.44–1.00)
GFR, Est AFR Am: >60 mL/min (ref >60)
GFR, Estimated: >60 mL/min (ref >60)
Glucose, Bld: 105 mg/dL — ABNORMAL HIGH (ref 70–99)
Potassium: 4.3 mmol/L (ref 3.5–5.1)
Sodium: 143 mmol/L (ref 135–145)

## 2019-03-03 MED ORDER — ZOLEDRONIC ACID 4 MG/100ML IV SOLN
4.0000 mg | Freq: Once | INTRAVENOUS | Status: AC
  Administered 2019-03-03: 4 mg via INTRAVENOUS

## 2019-03-03 MED ORDER — ZOLEDRONIC ACID 4 MG/100ML IV SOLN
INTRAVENOUS | Status: AC
  Filled 2019-03-03: qty 100

## 2019-03-03 MED ORDER — SODIUM CHLORIDE 0.9 % IV SOLN
Freq: Once | INTRAVENOUS | Status: AC
  Filled 2019-03-03: qty 250

## 2019-03-03 NOTE — Patient Instructions (Signed)
Zoledronic Acid injection (Hypercalcemia, Oncology) What is this medicine? ZOLEDRONIC ACID (ZOE le dron ik AS id) lowers the amount of calcium loss from bone. It is used to treat too much calcium in your blood from cancer. It is also used to prevent complications of cancer that has spread to the bone. This medicine may be used for other purposes; ask your health care provider or pharmacist if you have questions. COMMON BRAND NAME(S): Zometa What should I tell my health care provider before I take this medicine? They need to know if you have any of these conditions:  aspirin-sensitive asthma  cancer, especially if you are receiving medicines used to treat cancer  dental disease or wear dentures  infection  kidney disease  receiving corticosteroids like dexamethasone or prednisone  an unusual or allergic reaction to zoledronic acid, other medicines, foods, dyes, or preservatives  pregnant or trying to get pregnant  breast-feeding How should I use this medicine? This medicine is for infusion into a vein. It is given by a health care professional in a hospital or clinic setting. Talk to your pediatrician regarding the use of this medicine in children. Special care may be needed. Overdosage: If you think you have taken too much of this medicine contact a poison control center or emergency room at once. NOTE: This medicine is only for you. Do not share this medicine with others. What if I miss a dose? It is important not to miss your dose. Call your doctor or health care professional if you are unable to keep an appointment. What may interact with this medicine?  certain antibiotics given by injection  NSAIDs, medicines for pain and inflammation, like ibuprofen or naproxen  some diuretics like bumetanide, furosemide  teriparatide  thalidomide This list may not describe all possible interactions. Give your health care provider a list of all the medicines, herbs, non-prescription  drugs, or dietary supplements you use. Also tell them if you smoke, drink alcohol, or use illegal drugs. Some items may interact with your medicine. What should I watch for while using this medicine? Visit your doctor or health care professional for regular checkups. It may be some time before you see the benefit from this medicine. Do not stop taking your medicine unless your doctor tells you to. Your doctor may order blood tests or other tests to see how you are doing. Women should inform their doctor if they wish to become pregnant or think they might be pregnant. There is a potential for serious side effects to an unborn child. Talk to your health care professional or pharmacist for more information. You should make sure that you get enough calcium and vitamin D while you are taking this medicine. Discuss the foods you eat and the vitamins you take with your health care professional. Some people who take this medicine have severe bone, joint, and/or muscle pain. This medicine may also increase your risk for jaw problems or a broken thigh bone. Tell your doctor right away if you have severe pain in your jaw, bones, joints, or muscles. Tell your doctor if you have any pain that does not go away or that gets worse. Tell your dentist and dental surgeon that you are taking this medicine. You should not have major dental surgery while on this medicine. See your dentist to have a dental exam and fix any dental problems before starting this medicine. Take good care of your teeth while on this medicine. Make sure you see your dentist for regular follow-up   appointments. What side effects may I notice from receiving this medicine? Side effects that you should report to your doctor or health care professional as soon as possible:  allergic reactions like skin rash, itching or hives, swelling of the face, lips, or tongue  anxiety, confusion, or depression  breathing problems  changes in vision  eye  pain  feeling faint or lightheaded, falls  jaw pain, especially after dental work  mouth sores  muscle cramps, stiffness, or weakness  redness, blistering, peeling or loosening of the skin, including inside the mouth  trouble passing urine or change in the amount of urine Side effects that usually do not require medical attention (report to your doctor or health care professional if they continue or are bothersome):  bone, joint, or muscle pain  constipation  diarrhea  fever  hair loss  irritation at site where injected  loss of appetite  nausea, vomiting  stomach upset  trouble sleeping  trouble swallowing  weak or tired This list may not describe all possible side effects. Call your doctor for medical advice about side effects. You may report side effects to FDA at 1-800-FDA-1088. Where should I keep my medicine? This drug is given in a hospital or clinic and will not be stored at home. NOTE: This sheet is a summary. It may not cover all possible information. If you have questions about this medicine, talk to your doctor, pharmacist, or health care provider.  2020 Elsevier/Gold Standard (2013-06-14 14:19:39)  

## 2019-03-03 NOTE — Progress Notes (Signed)
Okay to move forward with Zometa with Calcium 8.9 per Dr. [name]. Patient thinks she takes Calcium at home but not sure. She stated that she will check once she  Gets home and call to let the desk nurse know.

## 2019-03-17 ENCOUNTER — Other Ambulatory Visit: Payer: Self-pay

## 2019-03-17 ENCOUNTER — Inpatient Hospital Stay: Payer: Medicare HMO | Admitting: Hematology

## 2019-03-17 VITALS — BP 152/75 | HR 86 | Temp 98.5°F | Resp 18 | Ht 65.0 in | Wt 168.5 lb

## 2019-03-17 DIAGNOSIS — C50919 Malignant neoplasm of unspecified site of unspecified female breast: Secondary | ICD-10-CM | POA: Diagnosis not present

## 2019-03-17 DIAGNOSIS — R0602 Shortness of breath: Secondary | ICD-10-CM | POA: Diagnosis not present

## 2019-03-17 DIAGNOSIS — J9602 Acute respiratory failure with hypercapnia: Secondary | ICD-10-CM | POA: Diagnosis not present

## 2019-03-17 DIAGNOSIS — J90 Pleural effusion, not elsewhere classified: Secondary | ICD-10-CM | POA: Diagnosis not present

## 2019-03-17 DIAGNOSIS — C7951 Secondary malignant neoplasm of bone: Secondary | ICD-10-CM | POA: Diagnosis not present

## 2019-03-17 NOTE — Progress Notes (Signed)
HEMATOLOGY/ONCOLOGY CLINIC NOTE  Date of Service: 03/17/2019  Patient Care Team: [name], MD as PCP - General (Family Medicine) Dorothy Spark, MD as PCP - Cardiology (Cardiology)  CHIEF COMPLAINTS/PURPOSE OF CONSULTATION:  Continue mx of Breast Cancer  HISTORY OF PRESENTING ILLNESS:   Beverly Tucker is a wonderful 79 y.o. female with past medical history of hypertension who presents with a 3-week history of progressive worsening of shortness of breath.  Patient reports that symptoms started with a runny nose and cough productive of clear white mucus.  Patient saw outpatient provider, was COVID negative, was started on allergy medication.  Shortness of breath continue to progress, saw provider on 9/12, was diagnosed with pneumonia, and started on Keflex.  At that visit, patient was also started on doxycycline for cellulitis (under right breast).  Patient reports taking both medications consistently every day.  Patient reports that shortness of breath is worse with exertion, worse when lying flat, reports sleeping with 2 pillows, denies PND symptoms.  Patient denies fever, chills, muscle aches, weight loss/gain, abdominal pain, urinary symptoms, diarrhea.  Reports 1 episode of nonbilious, nonbloody vomit last Saturday.  Patient reports lower extremity swelling over the past 2 days.  States last colonoscopy was 3 to 4 years ago, not sure when she is supposed to follow-up again.  Patient reports never having a mammogram.  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 Patient reports lesion under right breast which is non-tender and has been developing for the past couple of months. She has been taking doxycycline as this was thought to be cellulitis.  PCP is Kathryne Eriksson with [facility].   INTERVAL HISTORY:   Beverly Tucker is a wonderful 79 y.o. female who is  here for evaluation and management of breast cancer. We are joined by her son. The patient's last visit with [place] was on 02/03/2019. The pt reports that she is doing well overall.  The pt reports that she has been well in the interim, but is still having some difficulty breathing. She went to the ED on 02/19/19 due to palpitations. Her son notes that the pt has been doing a lot better at home without her oxygen. They are draining her lung fluid once per week and have been getting between 400-600 ccs at a time. Her breast wound has continued to heal and close at this time. Pt hasn't had any issues tolerating Letrozole. She has been eating and sleeping a bit better. Pt has been having some leg swelling and has continued to take 60 mg of Lasix per day. Pt does not elevate her legs while sitting. She has been experiencing clear sinus drainage and a dry mouth.   Lab results (02/19/19) of CBC and BMP is as follows: all values are WNL except for Hgb at 11.6, MCHC at 28.3, Chloride at 92, CO2 at 37, Glucose at 104.  On review of systems, pt reports SOB, leg swelling, improving appetite, improving sleeplessness, dry mouth and denies jaw pain and any other symptoms.    MEDICAL HISTORY:  Past Medical History:  Diagnosis Date  . Anemia   . Cancer (HCC)    Breast   . Chronic diastolic CHF (congestive heart failure) ([place])   . CKD (chronic kidney disease), stage III   . First degree AV block   . GERD (gastroesophageal reflux disease)   . Hypertension   .  Malignant pleural effusion   . Metastatic breast cancer ([place])   . PAF (paroxysmal atrial fibrillation) ([place])   . PSVT (paroxysmal supraventricular tachycardia) ([place])   . Tachy-brady syndrome ([place])     SURGICAL HISTORY: Past Surgical History:  Procedure Laterality Date  . CHOLECYSTECTOMY    . IR PERC PLEURAL DRAIN W/INDWELL CATH W/IMG GUIDE  11/08/2018  . IR THORACENTESIS ASP PLEURAL SPACE W/IMG GUIDE  10/22/2018  . IR THORACENTESIS ASP PLEURAL SPACE  W/IMG GUIDE  12/25/2018    SOCIAL HISTORY: Social History   Socioeconomic History  . Marital status: Widowed    Spouse name: Not on file  . Number of children: Not on file  . Years of education: Not on file  . Highest education level: Not on file  Occupational History  . Not on file  Tobacco Use  . Smoking status: Never Smoker  . Smokeless tobacco: Never Used  Substance and Sexual Activity  . Alcohol use: Never  . Drug use: Never  . Sexual activity: Not on file  Other Topics Concern  . Not on file  Social History Narrative  . Not on file   Social Determinants of Health   Financial Resource Strain:   . Difficulty of Paying Living Expenses: Not on file  Food Insecurity:   . Worried About Charity fundraiser in the Last Year: Not on file  . Ran Out of Food in the Last Year: Not on file  Transportation Needs:   . Lack of Transportation (Medical): Not on file  . Lack of Transportation (Non-Medical): Not on file  Physical Activity:   . Days of Exercise per Week: Not on file  . Minutes of Exercise per Session: Not on file  Stress:   . Feeling of Stress : Not on file  Social Connections:   . Frequency of Communication with Friends and Family: Not on file  . Frequency of Social Gatherings with Friends and Family: Not on file  . Attends Religious Services: Not on file  . Active Member of Clubs or Organizations: Not on file  . Attends Archivist Meetings: Not on file  . Marital Status: Not on file  Intimate Partner Violence:   . Fear of Current or Ex-Partner: Not on file  . Emotionally Abused: Not on file  . Physically Abused: Not on file  . Sexually Abused: Not on file    FAMILY HISTORY: Family History  Problem Relation Age of Onset  . Diabetes Mellitus II Other        son    ALLERGIES:  has No Known Allergies.  MEDICATIONS:  Current Outpatient Medications  Medication Sig Dispense Refill  . acetaminophen (TYLENOL) 500 MG tablet Take 1,000 mg by mouth  every 6 (six) hours as needed.    . ALPRAZolam (XANAX) 0.5 MG tablet Take 0.5-1 tablets (0.25-0.5 mg total) by mouth 2 (two) times daily as needed for anxiety or sleep. 15 tablet 0  . amiodarone (PACERONE) 200 MG tablet Take 1 tablet (200 mg total) by mouth daily. 90 tablet 3  . apixaban (ELIQUIS) 5 MG TABS tablet Take 1 tablet (5 mg total) by mouth 2 (two) times daily. 60 tablet 1  . ergocalciferol (VITAMIN D2) 1.25 MG (50000 UT) capsule Take 1 capsule (50,000 Units total) by mouth once a week. 12 capsule 0  . furosemide (LASIX) 40 MG tablet Take 1 and 1/2 tabs daily to = 60 mg daily 135 tablet 3  . hydrOXYzine (ATARAX/VISTARIL) 25 MG tablet Take  1 tablet (25 mg total) by mouth 3 (three) times daily as needed for anxiety (for anxiety and insomnia/sleep). 30 tablet 2  . letrozole (FEMARA) 2.5 MG tablet Take 1 tablet (2.5 mg total) by mouth daily. 30 tablet 11  . loperamide (IMODIUM) 2 MG capsule Take 1 capsule (2 mg total) by mouth as needed for diarrhea or loose stools. 30 capsule 0  . mirtazapine (REMERON) 15 MG tablet Take 1 tablet (15 mg total) by mouth at bedtime. 30 tablet 3  . ondansetron (ZOFRAN ODT) 4 MG disintegrating tablet Take 1 tablet (4 mg total) by mouth every 8 (eight) hours as needed for nausea or vomiting. 20 tablet 0  . potassium chloride (KLOR-CON) 10 MEQ tablet Take 2 tablets (20 mEq total) by mouth daily. 180 tablet 3   No current facility-administered medications for this visit.    REVIEW OF SYSTEMS:   A 10+ POINT REVIEW OF SYSTEMS WAS OBTAINED including neurology, dermatology, psychiatry, cardiac, respiratory, lymph, extremities, GI, GU, Musculoskeletal, constitutional, breasts, reproductive, HEENT.  All pertinent positives are noted in the HPI.  All others are negative.   PHYSICAL EXAMINATION: ECOG FS:2 - Symptomatic, <50% confined to bed  Vitals:   03/17/19 1503  BP: (!) 152/75  Pulse: 86  Resp: 18  Temp: 98.5 F (36.9 C)  SpO2: 92%   Wt Readings from Last 3  Encounters:  03/17/19 168 lb 8 oz (76.4 kg)  02/25/19 171 lb (77.6 kg)  02/03/19 171 lb 9.6 oz (77.8 kg)   Body mass index is 28.04 kg/m.    Exam was given in a wheelchair   GENERAL:alert, in no acute distress and comfortable SKIN: no acute rashes, breast lesion appears to have shrunk - much improved from prior to treatment EYES: conjunctiva are pink and non-injected, sclera anicteric OROPHARYNX: MMM, no exudates, no oropharyngeal erythema or ulceration NECK: supple, no JVD LYMPH:  no palpable lymphadenopathy in the cervical, axillary or inguinal regions LUNGS: fluid 1/4th up the chest wall on the right side HEART: regular rate & rhythm ABDOMEN:  normoactive bowel sounds , non tender, not distended. No palpable hepatosplenomegaly.  Extremity: 2+ pedal edema b/l PSYCH: alert & oriented x 3 with fluent speech NEURO: no focal motor/sensory deficits  LABORATORY DATA:  I have reviewed the data as listed  . CBC Latest Ref Rng & Units 02/19/2019 02/03/2019 01/02/2019  WBC 4.0 - 10.5 K/uL 8.2 7.5 6.9  Hemoglobin 12.0 - 15.0 g/dL 11.6(L) 10.8(L) 10.6(L)  Hematocrit 36.0 - 46.0 % 41.0 37.0 35.4(L)  Platelets 150 - 400 K/uL 341 323 319    . CMP Latest Ref Rng & Units 03/03/2019 02/19/2019 02/03/2019  Glucose 70 - 99 mg/dL 105(H) 104(H) 131(H)  BUN 8 - 23 mg/dL '14 10 15  ' Creatinine 0.44 - 1.00 mg/dL 0.81 0.81 0.84  Sodium 135 - 145 mmol/L 143 141 142  Potassium 3.5 - 5.1 mmol/L 4.3 4.4 3.9  Chloride 98 - 111 mmol/L 93(L) 92(L) 92(L)  CO2 22 - 32 mmol/L 42(H) 37(H) 39(H)  Calcium 8.9 - 10.3 mg/dL 8.9 9.0 8.9  Total Protein 6.5 - 8.1 g/dL - - 6.8  Total Bilirubin 0.3 - 1.2 mg/dL - - 0.2(L)  Alkaline Phos 38 - 126 U/L - - 134(H)  AST 15 - 41 U/L - - 14(L)  ALT 0 - 44 U/L - - 13  12/28/2018 DG Chest [place] 1 View (Accession [id number])      12/26/2018 DG Chest 2 View (Accession [id number])  12/25/2018 DG Chest 1  View (Accession [id number])  12/25/2018 DG Chest 2 View (Accession  [id number])  10/16/2018 Surgical Pathology    10/16/2018 Surgical Pathology    RADIOGRAPHIC STUDIES: I have personally reviewed the radiological images as listed and agreed with the findings in the report. DG Chest Portable 1 View  Result Date: 02/19/2019 CLINICAL DATA:  Dyspnea, history CHF, breast cancer, hypertension, atrial fibrillation EXAM: PORTABLE CHEST 1 VIEW COMPARISON:  Portable exam 1836 hours compared to 12/28/2018 FINDINGS: LEFT PleurX catheter noted. Enlargement of cardiac silhouette. Atherosclerotic calcification aorta. Bibasilar pleural effusions and atelectasis increased on RIGHT. Upper lungs clear. No pneumothorax. Bones demineralized. IMPRESSION: Bibasilar pleural effusions atelectasis increased on RIGHT. Electronically Signed   By: Lavonia Dana M.D.   On: 02/19/2019 19:24    ASSESSMENT & PLAN:   #1 Rt breast metastastic cancer ERE/PR+, Her2 neg  10/14/2018 CT Chest ([id number])  revealed "1. No convincing pneumonia. 2. Moderate left and small right pleural effusions. There is mild bilateral interstitial thickening most evident in the lower lungs. This suggests mild interstitial edema. 3. Dependent atelectasis in the lower lobes, left greater than right, with milder atelectasis in the left upper lobe. 4. Small lung nodules most evident in the right middle lobe. 5. Abnormal soft tissue contiguous with the skin of the medial right breast. Although this may be infectious/inflammatory, neoplastic disease should be considered. Consider follow-up diagnostic mammography and possible right breast ultrasound for further assessment."  10/16/2018 CT Angio Chest ([id number])  revealed "1. Technically adequate exam showing no acute pulmonary embolus. 2. Small LEFT pneumothorax, estimated to be 5-10% of lung volume. 3. Small to moderate LEFT pleural effusion and associated Atelectasis. 4. Trace RIGHT pleural effusion and basilar atelectasis. 5. Cardiomegaly and coronary artery disease. 6.  RIGHT breast skin thickening and irregularity suspicious for malignancy. Recommend diagnostic mammography and ultrasound when the patient is able. 7. Small bilateral axillary lymph nodes, 1 centimeter in diameter. 8. Aortic Atherosclerosis (ICD10-I70.0)."  10/16/2018 Surgical Pathology of right breast excision reveals "Metastatic Carcinoma. Tumor cells are NEGATIVE for Her2".  10/21/2018 MRI Brain ([id number]) revealed "Negative MRI brain with contrast. Negative for metastatic disease."  10/21/2018 CT Abd/Pel ([id number]) revealed "1. Large malignant left-sided effusion. Trace effusion on the right with basilar atelectasis. 2. Variation in parenchymal enhancement raising the question of hilar lesion in the left chest, consider CT chest if not performed for further evaluation. 3. 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 4. Endometrial thickening may represent a second primary, consider endovaginal sonogram for further assessment as warranted. 5. Question of added enhancement in these central canal along the spinal cord internal nerve roots. This could be seen in the setting of CNS involvement but is not certain. MRI of the spine for additional staging could be performed as warranted. 6. Area of vague hypoattenuation anterior and inferior to portal structures in medial section left hepatic lobe likely an area of fatty infiltration, attention on follow-up or assess with MR for definitive characterization."  #2 Malignant Pleural effusion moderate left and small right-sided pleural effusion with shortness of breath. 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  Pneumothorax status post thoracentesis  #3 Congestive heart failure #4 Atrial fibrillation with RVR on therapeutic anticoagulation #5 CKD #6 hypertension #7 coag negative staph on  blood cultures thought to be contaminant  PLAN:  -Discussed pt labwork 02/19/2019;  all values are WNL except for Hgb at 11.6, MCHC  at 28.3, Chloride at 92, CO2 at 37, Glucose at 104. -Discussed 02/03/2019 CA 15-3 at 18.5 -Discussed 02/03/2019 CA 27.29 at 19.7 -Breast wound has improved upon clinical exam -Recommend pt elevate legs to improve leg swelling -Recommend a steam inhaler to help improve chest congestion -The pt has no prohibitive toxicities from continuing Letrozole at this time -Will continue Letrozole for 6 weeks until repeat scans, will then reassess treatment plan -Will follow up on pulmonology referral to [facility]  -Will repeat CT C/A/P in 5 weeks  -Will see back in 6 weeks with labs   FOLLOW UP: -Pulmonology referral placed 1/4-- patient still awaiting appointment -- needs referral -CT chest/abd/plevis in 5 weeks -RTC with Dr [name] with labs in 6 weeks   The total time spent in the appt was 30 minutes and more than 50% was on counseling and direct patient cares.  All of the patient's questions were answered with apparent satisfaction. The patient knows to call the clinic with any problems, questions or concerns.Sullivan Lone MD [place] AAHIVMS [facility] Hematology/Oncology Physician [facility]  (Office):       [phone] (Work cell):  [phone] (Fax):           [phone]  03/17/2019 3:59 PM  I, Yevette Edwards, am acting as a scribe for Dr. Sullivan Lone.   .I have reviewed the above documentation for accuracy and completeness, and I agree with the above. Brunetta Genera MD

## 2019-03-19 ENCOUNTER — Inpatient Hospital Stay (HOSPITAL_COMMUNITY): Payer: Medicare HMO

## 2019-03-19 ENCOUNTER — Emergency Department (HOSPITAL_COMMUNITY): Payer: Medicare HMO

## 2019-03-19 ENCOUNTER — Encounter (HOSPITAL_COMMUNITY): Payer: Self-pay | Admitting: Pulmonary Disease

## 2019-03-19 ENCOUNTER — Other Ambulatory Visit: Payer: Self-pay

## 2019-03-19 ENCOUNTER — Inpatient Hospital Stay (HOSPITAL_COMMUNITY)
Admission: EM | Admit: 2019-03-19 | Discharge: 2019-03-31 | DRG: 207 | Disposition: E | Payer: Medicare HMO | Attending: Internal Medicine | Admitting: Internal Medicine

## 2019-03-19 DIAGNOSIS — N1831 Chronic kidney disease, stage 3a: Secondary | ICD-10-CM | POA: Diagnosis present

## 2019-03-19 DIAGNOSIS — C7951 Secondary malignant neoplasm of bone: Secondary | ICD-10-CM | POA: Diagnosis present

## 2019-03-19 DIAGNOSIS — I130 Hypertensive heart and chronic kidney disease with heart failure and stage 1 through stage 4 chronic kidney disease, or unspecified chronic kidney disease: Secondary | ICD-10-CM | POA: Diagnosis present

## 2019-03-19 DIAGNOSIS — R0602 Shortness of breath: Secondary | ICD-10-CM | POA: Diagnosis present

## 2019-03-19 DIAGNOSIS — J9601 Acute respiratory failure with hypoxia: Secondary | ICD-10-CM

## 2019-03-19 DIAGNOSIS — Z66 Do not resuscitate: Secondary | ICD-10-CM | POA: Diagnosis not present

## 2019-03-19 DIAGNOSIS — Z79811 Long term (current) use of aromatase inhibitors: Secondary | ICD-10-CM | POA: Diagnosis not present

## 2019-03-19 DIAGNOSIS — I482 Chronic atrial fibrillation, unspecified: Secondary | ICD-10-CM | POA: Diagnosis present

## 2019-03-19 DIAGNOSIS — Z0189 Encounter for other specified special examinations: Secondary | ICD-10-CM

## 2019-03-19 DIAGNOSIS — F419 Anxiety disorder, unspecified: Secondary | ICD-10-CM | POA: Diagnosis present

## 2019-03-19 DIAGNOSIS — J189 Pneumonia, unspecified organism: Secondary | ICD-10-CM | POA: Diagnosis present

## 2019-03-19 DIAGNOSIS — Z833 Family history of diabetes mellitus: Secondary | ICD-10-CM | POA: Diagnosis not present

## 2019-03-19 DIAGNOSIS — C50911 Malignant neoplasm of unspecified site of right female breast: Secondary | ICD-10-CM | POA: Diagnosis present

## 2019-03-19 DIAGNOSIS — Z7189 Other specified counseling: Secondary | ICD-10-CM | POA: Diagnosis not present

## 2019-03-19 DIAGNOSIS — Z9911 Dependence on respirator [ventilator] status: Secondary | ICD-10-CM

## 2019-03-19 DIAGNOSIS — Z515 Encounter for palliative care: Secondary | ICD-10-CM | POA: Diagnosis not present

## 2019-03-19 DIAGNOSIS — I495 Sick sinus syndrome: Secondary | ICD-10-CM | POA: Diagnosis present

## 2019-03-19 DIAGNOSIS — J90 Pleural effusion, not elsewhere classified: Secondary | ICD-10-CM | POA: Diagnosis not present

## 2019-03-19 DIAGNOSIS — E876 Hypokalemia: Secondary | ICD-10-CM | POA: Diagnosis not present

## 2019-03-19 DIAGNOSIS — J9690 Respiratory failure, unspecified, unspecified whether with hypoxia or hypercapnia: Secondary | ICD-10-CM

## 2019-03-19 DIAGNOSIS — I5033 Acute on chronic diastolic (congestive) heart failure: Secondary | ICD-10-CM | POA: Diagnosis present

## 2019-03-19 DIAGNOSIS — C799 Secondary malignant neoplasm of unspecified site: Secondary | ICD-10-CM | POA: Diagnosis not present

## 2019-03-19 DIAGNOSIS — I440 Atrioventricular block, first degree: Secondary | ICD-10-CM | POA: Diagnosis present

## 2019-03-19 DIAGNOSIS — G9341 Metabolic encephalopathy: Secondary | ICD-10-CM | POA: Diagnosis not present

## 2019-03-19 DIAGNOSIS — I959 Hypotension, unspecified: Secondary | ICD-10-CM | POA: Diagnosis not present

## 2019-03-19 DIAGNOSIS — J9600 Acute respiratory failure, unspecified whether with hypoxia or hypercapnia: Secondary | ICD-10-CM | POA: Diagnosis not present

## 2019-03-19 DIAGNOSIS — I480 Paroxysmal atrial fibrillation: Secondary | ICD-10-CM | POA: Diagnosis present

## 2019-03-19 DIAGNOSIS — Z4659 Encounter for fitting and adjustment of other gastrointestinal appliance and device: Secondary | ICD-10-CM

## 2019-03-19 DIAGNOSIS — Z452 Encounter for adjustment and management of vascular access device: Secondary | ICD-10-CM

## 2019-03-19 DIAGNOSIS — N179 Acute kidney failure, unspecified: Secondary | ICD-10-CM | POA: Diagnosis present

## 2019-03-19 DIAGNOSIS — D638 Anemia in other chronic diseases classified elsewhere: Secondary | ICD-10-CM | POA: Diagnosis present

## 2019-03-19 DIAGNOSIS — J910 Malignant pleural effusion: Secondary | ICD-10-CM | POA: Diagnosis present

## 2019-03-19 DIAGNOSIS — Z20822 Contact with and (suspected) exposure to covid-19: Secondary | ICD-10-CM | POA: Diagnosis present

## 2019-03-19 DIAGNOSIS — E874 Mixed disorder of acid-base balance: Secondary | ICD-10-CM | POA: Diagnosis not present

## 2019-03-19 DIAGNOSIS — J9602 Acute respiratory failure with hypercapnia: Principal | ICD-10-CM

## 2019-03-19 DIAGNOSIS — Z978 Presence of other specified devices: Secondary | ICD-10-CM

## 2019-03-19 DIAGNOSIS — I340 Nonrheumatic mitral (valve) insufficiency: Secondary | ICD-10-CM | POA: Diagnosis not present

## 2019-03-19 DIAGNOSIS — K219 Gastro-esophageal reflux disease without esophagitis: Secondary | ICD-10-CM | POA: Diagnosis present

## 2019-03-19 DIAGNOSIS — Z7901 Long term (current) use of anticoagulants: Secondary | ICD-10-CM

## 2019-03-19 DIAGNOSIS — I361 Nonrheumatic tricuspid (valve) insufficiency: Secondary | ICD-10-CM | POA: Diagnosis not present

## 2019-03-19 DIAGNOSIS — Z9889 Other specified postprocedural states: Secondary | ICD-10-CM

## 2019-03-19 DIAGNOSIS — I4891 Unspecified atrial fibrillation: Secondary | ICD-10-CM | POA: Diagnosis not present

## 2019-03-19 DIAGNOSIS — L8990 Pressure ulcer of unspecified site, unspecified stage: Secondary | ICD-10-CM | POA: Insufficient documentation

## 2019-03-19 LAB — POC SARS CORONAVIRUS 2 AG -  ED: SARS Coronavirus 2 Ag: NEGATIVE

## 2019-03-19 LAB — CBC
HCT: 39.5 % (ref 36.0–46.0)
Hemoglobin: 11.1 g/dL — ABNORMAL LOW (ref 12.0–15.0)
MCH: 27 pg (ref 26.0–34.0)
MCHC: 28.1 g/dL — ABNORMAL LOW (ref 30.0–36.0)
MCV: 96.1 fL (ref 80.0–100.0)
Platelets: 397 10*3/uL (ref 150–400)
RBC: 4.11 MIL/uL (ref 3.87–5.11)
RDW: 15.2 % (ref 11.5–15.5)
WBC: 17 10*3/uL — ABNORMAL HIGH (ref 4.0–10.5)
nRBC: 0 % (ref 0.0–0.2)

## 2019-03-19 LAB — URINALYSIS, ROUTINE W REFLEX MICROSCOPIC
Bilirubin Urine: NEGATIVE
Glucose, UA: NEGATIVE mg/dL
Ketones, ur: NEGATIVE mg/dL
Nitrite: NEGATIVE
Protein, ur: 30 mg/dL — AB
RBC / HPF: >50 RBC/hpf — ABNORMAL HIGH (ref 0–5)
Specific Gravity, Urine: >1.046 — ABNORMAL HIGH (ref 1.005–1.030)
pH: 7 (ref 5.0–8.0)

## 2019-03-19 LAB — MRSA PCR SCREENING: MRSA by PCR: NEGATIVE

## 2019-03-19 LAB — RESPIRATORY PANEL BY RT PCR (FLU A&B, COVID)
Influenza A by PCR: NEGATIVE
Influenza B by PCR: NEGATIVE
SARS Coronavirus 2 by RT PCR: NEGATIVE

## 2019-03-19 LAB — BLOOD GAS, ARTERIAL
Acid-Base Excess: 11.8 mmol/L — ABNORMAL HIGH (ref 0.0–2.0)
Acid-Base Excess: 15.6 mmol/L — ABNORMAL HIGH (ref 0.0–2.0)
Bicarbonate: 43.6 mmol/L — ABNORMAL HIGH (ref 20.0–28.0)
Bicarbonate: 42 mmol/L — ABNORMAL HIGH (ref 20.0–28.0)
FIO2: 100
MECHVT: 450 mL
O2 Content: 4 L/min
O2 Saturation: 93.8 %
O2 Saturation: 99.9 %
PEEP: 5 cmH2O
Patient temperature: 98.6
Patient temperature: 98.6
RATE: 122 resp/min
pCO2 arterial: 117 mmHg (ref 32.0–48.0)
pCO2 arterial: 62.1 mmHg — ABNORMAL HIGH (ref 32.0–48.0)
pH, Arterial: 7.195 — CL (ref 7.350–7.450)
pH, Arterial: 7.445 (ref 7.350–7.450)
pO2, Arterial: 80.9 mmHg — ABNORMAL LOW (ref 83.0–108.0)
pO2, Arterial: 204 mmHg — ABNORMAL HIGH (ref 83.0–108.0)

## 2019-03-19 LAB — BRAIN NATRIURETIC PEPTIDE: B Natriuretic Peptide: 101 pg/mL — ABNORMAL HIGH (ref 0.0–100.0)

## 2019-03-19 LAB — BASIC METABOLIC PANEL
Anion gap: 9 (ref 5–15)
BUN: 28 mg/dL — ABNORMAL HIGH (ref 8–23)
CO2: 42 mmol/L — ABNORMAL HIGH (ref 22–32)
Calcium: 8.6 mg/dL — ABNORMAL LOW (ref 8.9–10.3)
Chloride: 86 mmol/L — ABNORMAL LOW (ref 98–111)
Creatinine, Ser: 1.01 mg/dL — ABNORMAL HIGH (ref 0.44–1.00)
GFR calc Af Amer: >60 mL/min (ref >60)
GFR calc non Af Amer: 53 mL/min — ABNORMAL LOW (ref >60)
Glucose, Bld: 143 mg/dL — ABNORMAL HIGH (ref 70–99)
Potassium: 5.1 mmol/L (ref 3.5–5.1)
Sodium: 137 mmol/L (ref 135–145)

## 2019-03-19 LAB — TRIGLYCERIDES: Triglycerides: 164 mg/dL — ABNORMAL HIGH (ref <150)

## 2019-03-19 LAB — TROPONIN I (HIGH SENSITIVITY)
Troponin I (High Sensitivity): 16 ng/L (ref <18)
Troponin I (High Sensitivity): 22 ng/L — ABNORMAL HIGH (ref <18)

## 2019-03-19 LAB — LACTIC ACID, PLASMA: Lactic Acid, Venous: 2 mmol/L (ref 0.5–1.9)

## 2019-03-19 LAB — PROCALCITONIN: Procalcitonin: 0.35 ng/mL

## 2019-03-19 MED ORDER — FENTANYL 2500MCG IN NS 250ML (10MCG/ML) PREMIX INFUSION
25.0000 ug/h | INTRAVENOUS | Status: DC
  Administered 2019-03-19: 25 ug/h via INTRAVENOUS
  Administered 2019-03-20: 175 ug/h via INTRAVENOUS
  Administered 2019-03-20: 125 ug/h via INTRAVENOUS
  Administered 2019-03-21: 200 ug/h via INTRAVENOUS
  Administered 2019-03-22: 150 ug/h via INTRAVENOUS
  Administered 2019-03-22: 300 ug/h via INTRAVENOUS
  Administered 2019-03-23: 125 ug/h via INTRAVENOUS
  Filled 2019-03-19 (×7): qty 250

## 2019-03-19 MED ORDER — SODIUM CHLORIDE 0.9 % IV SOLN
2.0000 g | Freq: Every day | INTRAVENOUS | Status: AC
  Administered 2019-03-20 – 2019-03-26 (×7): 2 g via INTRAVENOUS
  Filled 2019-03-19 (×2): qty 2
  Filled 2019-03-19 (×2): qty 20
  Filled 2019-03-19 (×3): qty 2

## 2019-03-19 MED ORDER — IOHEXOL 350 MG/ML SOLN
100.0000 mL | Freq: Once | INTRAVENOUS | Status: AC | PRN
  Administered 2019-03-19: 80 mL via INTRAVENOUS

## 2019-03-19 MED ORDER — SODIUM CHLORIDE 0.9 % IV SOLN
1.0000 g | Freq: Once | INTRAVENOUS | Status: AC
  Administered 2019-03-19: 1 g via INTRAVENOUS
  Filled 2019-03-19: qty 10

## 2019-03-19 MED ORDER — SODIUM CHLORIDE 0.9 % IV SOLN
500.0000 mg | INTRAVENOUS | Status: AC
  Administered 2019-03-20 – 2019-03-24 (×5): 500 mg via INTRAVENOUS
  Filled 2019-03-19 (×6): qty 500

## 2019-03-19 MED ORDER — SODIUM CHLORIDE 0.9% FLUSH
10.0000 mL | INTRAVENOUS | Status: DC | PRN
  Administered 2019-03-27: 10 mL

## 2019-03-19 MED ORDER — SODIUM CHLORIDE 0.9 % IV SOLN
500.0000 mg | Freq: Once | INTRAVENOUS | Status: AC
  Administered 2019-03-19: 500 mg via INTRAVENOUS

## 2019-03-19 MED ORDER — NOREPINEPHRINE 4 MG/250ML-% IV SOLN
0.0000 ug/min | INTRAVENOUS | Status: DC
  Administered 2019-03-19: 2 ug/min via INTRAVENOUS
  Administered 2019-03-20: 7 ug/min via INTRAVENOUS
  Administered 2019-03-20: 8 ug/min via INTRAVENOUS
  Administered 2019-03-21: 2 ug/min via INTRAVENOUS
  Administered 2019-03-21: 5 ug/min via INTRAVENOUS
  Filled 2019-03-19 (×3): qty 250

## 2019-03-19 MED ORDER — FENTANYL CITRATE (PF) 100 MCG/2ML IJ SOLN
50.0000 ug | Freq: Once | INTRAMUSCULAR | Status: AC
  Administered 2019-03-19: 50 ug via INTRAVENOUS
  Filled 2019-03-19: qty 2

## 2019-03-19 MED ORDER — PROPOFOL 1000 MG/100ML IV EMUL
0.0000 ug/kg/min | INTRAVENOUS | Status: DC
  Administered 2019-03-19: 15:00:00 5 ug/kg/min via INTRAVENOUS
  Administered 2019-03-20 (×3): 35 ug/kg/min via INTRAVENOUS
  Administered 2019-03-20: 45 ug/kg/min via INTRAVENOUS
  Administered 2019-03-21: 30 ug/kg/min via INTRAVENOUS
  Administered 2019-03-21: 35 ug/kg/min via INTRAVENOUS
  Filled 2019-03-19 (×9): qty 100

## 2019-03-19 MED ORDER — AMIODARONE HCL 200 MG PO TABS
200.0000 mg | ORAL_TABLET | Freq: Every day | ORAL | Status: DC
  Administered 2019-03-20 – 2019-03-28 (×7): 200 mg via NASOGASTRIC
  Filled 2019-03-19 (×8): qty 1

## 2019-03-19 MED ORDER — ALBUTEROL SULFATE HFA 108 (90 BASE) MCG/ACT IN AERS
4.0000 | INHALATION_SPRAY | Freq: Once | RESPIRATORY_TRACT | Status: AC
  Administered 2019-03-19: 4 via RESPIRATORY_TRACT
  Filled 2019-03-19: qty 6.7

## 2019-03-19 MED ORDER — LETROZOLE 2.5 MG PO TABS
2.5000 mg | ORAL_TABLET | Freq: Every day | ORAL | Status: DC
  Administered 2019-03-20 – 2019-03-29 (×6): 2.5 mg via ORAL
  Filled 2019-03-19 (×11): qty 1

## 2019-03-19 MED ORDER — FENTANYL BOLUS VIA INFUSION
25.0000 ug | INTRAVENOUS | Status: DC | PRN
  Administered 2019-03-19 – 2019-03-21 (×2): 25 ug via INTRAVENOUS
  Filled 2019-03-19: qty 25

## 2019-03-19 MED ORDER — AMIODARONE HCL 200 MG PO TABS
200.0000 mg | ORAL_TABLET | Freq: Every day | ORAL | Status: DC
  Filled 2019-03-19: qty 1

## 2019-03-19 MED ORDER — SODIUM CHLORIDE 0.9% FLUSH
10.0000 mL | Freq: Two times a day (BID) | INTRAVENOUS | Status: DC
  Administered 2019-03-19: 22:00:00 40 mL
  Administered 2019-03-20: 30 mL
  Administered 2019-03-20 – 2019-03-21 (×2): 10 mL
  Administered 2019-03-21: 20 mL
  Administered 2019-03-22: 10 mL
  Administered 2019-03-22: 20 mL
  Administered 2019-03-23: 10 mL
  Administered 2019-03-23: 20 mL
  Administered 2019-03-24 – 2019-03-25 (×3): 10 mL
  Administered 2019-03-25: 20 mL
  Administered 2019-03-26: 30 mL
  Administered 2019-03-26 – 2019-03-28 (×4): 10 mL
  Administered 2019-03-29: 11 mL

## 2019-03-19 MED ORDER — FAMOTIDINE IN NACL 20-0.9 MG/50ML-% IV SOLN
20.0000 mg | Freq: Two times a day (BID) | INTRAVENOUS | Status: DC
  Administered 2019-03-19 – 2019-03-20 (×2): 20 mg via INTRAVENOUS
  Filled 2019-03-19 (×2): qty 50

## 2019-03-19 MED ORDER — CHLORHEXIDINE GLUCONATE 0.12% ORAL RINSE (MEDLINE KIT): 15.0000 mL | Freq: Two times a day (BID) | OROMUCOSAL | Status: DC

## 2019-03-19 MED ORDER — FUROSEMIDE 10 MG/ML IJ SOLN
40.0000 mg | Freq: Two times a day (BID) | INTRAMUSCULAR | Status: DC
  Administered 2019-03-20 – 2019-03-21 (×3): 40 mg via INTRAVENOUS
  Filled 2019-03-19 (×3): qty 4

## 2019-03-19 MED ORDER — HEPARIN SODIUM (PORCINE) 5000 UNIT/ML IJ SOLN
5000.0000 [IU] | Freq: Three times a day (TID) | INTRAMUSCULAR | Status: DC
  Administered 2019-03-19 – 2019-03-26 (×20): 5000 [IU] via SUBCUTANEOUS
  Filled 2019-03-19 (×15): qty 1

## 2019-03-19 MED ORDER — ETOMIDATE 2 MG/ML IV SOLN
20.0000 mg | Freq: Once | INTRAVENOUS | Status: AC
  Administered 2019-03-19: 20 mg via INTRAVENOUS

## 2019-03-19 MED ORDER — CHLORHEXIDINE GLUCONATE CLOTH 2 % EX PADS
6.0000 | MEDICATED_PAD | Freq: Every day | CUTANEOUS | Status: DC
  Administered 2019-03-19 – 2019-03-29 (×11): 6 via TOPICAL

## 2019-03-19 MED ORDER — CHLORHEXIDINE GLUCONATE 0.12% ORAL RINSE (MEDLINE KIT)
15.0000 mL | Freq: Two times a day (BID) | OROMUCOSAL | Status: DC
  Administered 2019-03-19 – 2019-03-26 (×15): 15 mL via OROMUCOSAL

## 2019-03-19 MED ORDER — SODIUM CHLORIDE (PF) 0.9 % IJ SOLN
INTRAMUSCULAR | Status: AC
  Filled 2019-03-19: qty 50

## 2019-03-19 MED ORDER — SODIUM CHLORIDE 0.9 % IV BOLUS
250.0000 mL | Freq: Once | INTRAVENOUS | Status: AC
  Administered 2019-03-19: 250 mL via INTRAVENOUS

## 2019-03-19 MED ORDER — ORAL CARE MOUTH RINSE: 15.0000 mL | OROMUCOSAL | Status: DC

## 2019-03-19 MED ORDER — ORAL CARE MOUTH RINSE
15.0000 mL | OROMUCOSAL | Status: DC
  Administered 2019-03-19 – 2019-03-26 (×67): 15 mL via OROMUCOSAL

## 2019-03-19 NOTE — ED Notes (Signed)
RT paged about ABG collection.

## 2019-03-19 NOTE — Progress Notes (Signed)
[place] Progress Note Patient Name: Beverly Tucker DOB: [birth year] MRN: [id number]   Date of Service  03/23/2019  HPI/Events of Note  Pt needs central line pulled back 5 - 8 cm .  eICU Interventions  ED doctor at [facility] pulled the catheter back and re-dressed it as a courtesy to [employer].        Kerry Kass Tayvia Faughnan 03/30/2019, 8:56 PM

## 2019-03-19 NOTE — Progress Notes (Signed)
eLink Physician-Brief Progress Note Patient Name: Beverly Tucker DOB: September 12, 1940 MRN: [id number]   Date of Service  03/11/2019  HPI/Events of Note  Called for hypotension BP 87/37. Renal failure but making urine Net 0 euvolemic Temp 100.1 WBC 17,000 Blood Cx pending Azithro and Rocephin ordered  eICU Interventions  Sputum Cx now NS bolus 250 cc's  Lactate now PCT now Levo ordered ( pt with CVC) MAP goal > 65   Intubated and stable on vent No s/s bleeding  Intervention Category Major Interventions: Hypotension - evaluation and management  Magdalen Spatz 03/27/2019, 8:32 PM

## 2019-03-19 NOTE — Progress Notes (Signed)
Chest X ray impression stating that right IJ central line should be retracted 3-4 cm s/p Vanita Panda MD rectracting central venous catheter 5 cm. Lucile Shutters, MD notified. Orders to leave CVC as it is and is okay to use.

## 2019-03-19 NOTE — Progress Notes (Addendum)
Notified Lab that ABG being sent for analysis. This ABG was drawn on 4 lpm - RT titrated to 2 lpm (as used at home) as Sp02 was 100%. RN aware.

## 2019-03-19 NOTE — Progress Notes (Signed)
Notified Lab that ABG being sent for analysis. 

## 2019-03-19 NOTE — Progress Notes (Signed)
Assisted with transporting PT from [facility] to [facility] CT and then to [facility] ICU 1223 while on ventilator at 100% fi02- uneventful.

## 2019-03-19 NOTE — ED Provider Notes (Signed)
[facility] [place] DEPT Provider Note   CSN: [id number] Arrival date & time: 03/30/2019  1051     History Chief Complaint  Patient presents with  . Shortness of Breath    Beverly Tucker is a 79 y.o. female.  HPI   Patient presents to the ED for evaluation of shortness of breath.  Patient has a history of breast cancer and recurrent pleural effusions requiring serial thoracentesis.  Patient's last thoracentesis was last week.  She was supposed to have this done again today.  Patient states in the last day she has had significantly increasing shortness of breath.  Patient denies any chest pain.  No fevers.  She has chronic leg swelling but has not noted any recent changes.  EMS was called today.  She is continued on supplemental oxygen.  Past Medical History:  Diagnosis Date  . Anemia   . Cancer (HCC)    Breast   . Chronic diastolic CHF (congestive heart failure) ([place])   . CKD (chronic kidney disease), stage III   . First degree AV block   . GERD (gastroesophageal reflux disease)   . Hypertension   . Malignant pleural effusion   . Metastatic breast cancer ([place])   . PAF (paroxysmal atrial fibrillation) ([place])   . PSVT (paroxysmal supraventricular tachycardia) ([place])   . Tachy-brady syndrome [facility])     Patient Active Problem List   Diagnosis Date Noted  . Goals of care, counseling/discussion   . Palliative care by specialist   . Encounter for hospice care discussion   . UTI (urinary tract infection) 12/25/2018  . Generalized weakness 12/25/2018  . Hypokalemia   . Community acquired pneumonia   . Hyperkalemia   . Gastroesophageal reflux disease without esophagitis   . Malnutrition of moderate degree 11/18/2018  . PSVT (paroxysmal supraventricular tachycardia) ([place]) 11/16/2018  . Chronic diastolic HF (heart failure) ([place]) 11/16/2018  . Elevated troponin 11/16/2018  . Leukocytosis 11/16/2018  . Atrial fibrillation, chronic ([place]) 11/16/2018  . Malignant  pleural effusion   . 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 11/06/2018  . AKI (acute kidney injury) ([place]) 11/06/2018  . AF (paroxysmal atrial fibrillation) ([place]) 11/06/2018  . Pleural effusion 11/06/2018  . Bone metastases ([place]) 11/04/2018  . Metastatic breast cancer ([place])   . Pneumothorax   . Gram-positive bacteremia   . Mass of breast, right 10/15/2018  . Acute CHF (congestive heart failure) ([place]) 10/15/2018  . Shortness of breath 10/14/2018  . CKD (chronic kidney disease) stage 3, GFR 30-59 ml/min 02/06/2018  . HTN (hypertension) 12/01/2015    Past Surgical History:  Procedure Laterality Date  . CHOLECYSTECTOMY    . IR PERC PLEURAL DRAIN W/INDWELL CATH W/IMG GUIDE  11/08/2018  . IR THORACENTESIS ASP PLEURAL SPACE W/IMG GUIDE  10/22/2018  . IR THORACENTESIS ASP PLEURAL SPACE W/IMG GUIDE  12/25/2018     OB History   No obstetric history on file.     Family History  Problem Relation Age of Onset  . Diabetes Mellitus II Other        son    Social History   Tobacco Use  . Smoking status: Never Smoker  . Smokeless tobacco: Never Used  Substance Use Topics  . Alcohol use: Never  . Drug use: Never    Home Medications Prior to Admission medications   Medication Sig Start Date End Date Taking? Authorizing Provider  acetaminophen (TYLENOL) 500 MG tablet Take 1,000 mg by mouth every 6 (six) hours as needed.  [provider]  ALPRAZolam Duanne Moron) 0.5 MG tablet Take 0.5-1 tablets (0.25-0.5 mg total) by mouth 2 (two) times daily as needed for anxiety or sleep. 12/31/18   Roxan Hockey, MD  amiodarone (PACERONE) 200 MG tablet Take 1 tablet (200 mg total) by mouth daily. 01/10/19   Dunn, Nedra Hai, PA-C  apixaban (ELIQUIS) 5 MG TABS tablet Take 1 tablet (5 mg total) by mouth 2 (two) times daily. 10/23/18   Jeanmarie Hubert, MD  ergocalciferol (VITAMIN D2) 1.25 MG (50000 UT) capsule Take 1 capsule (50,000 Units total) by mouth once a week. 02/03/19   Brunetta Genera, MD    furosemide (LASIX) 40 MG tablet Take 1 and 1/2 tabs daily to = 60 mg daily 01/10/19   Dunn, Nedra Hai, PA-C  hydrOXYzine (ATARAX/VISTARIL) 25 MG tablet Take 1 tablet (25 mg total) by mouth 3 (three) times daily as needed for anxiety (for anxiety and insomnia/sleep). 12/31/18   Roxan Hockey, MD  letrozole (FEMARA) 2.5 MG tablet Take 1 tablet (2.5 mg total) by mouth daily. 02/03/19   Brunetta Genera, MD  loperamide (IMODIUM) 2 MG capsule Take 1 capsule (2 mg total) by mouth as needed for diarrhea or loose stools. 11/24/18   Dessa Phi, DO  mirtazapine (REMERON) 15 MG tablet Take 1 tablet (15 mg total) by mouth at bedtime. 12/31/18   Roxan Hockey, MD  ondansetron (ZOFRAN ODT) 4 MG disintegrating tablet Take 1 tablet (4 mg total) by mouth every 8 (eight) hours as needed for nausea or vomiting. 12/20/18   Donne Hazel, MD  potassium chloride (KLOR-CON) 10 MEQ tablet Take 2 tablets (20 mEq total) by mouth daily. 01/10/19   Charlie Pitter, PA-C    Allergies    Patient has no known allergies.  Review of Systems   Review of Systems  Constitutional: Negative for fever.  Cardiovascular: Negative for chest pain.  All other systems reviewed and are negative.   Physical Exam Updated Vital Signs BP (!) 158/66   Pulse 80   Temp 98.7 F (37.1 C) (Oral)   Resp 15   SpO2 100%   Physical Exam Vitals and nursing note reviewed.  Constitutional:      General: She is in acute distress.     Appearance: She is well-developed. She is ill-appearing.  HENT:     Head: Normocephalic and atraumatic.     Right Ear: External ear normal.     Left Ear: External ear normal.  Eyes:     General: No scleral icterus.       Right eye: No discharge.        Left eye: No discharge.     Conjunctiva/sclera: Conjunctivae normal.  Neck:     Trachea: No tracheal deviation.  Cardiovascular:     Rate and Rhythm: Normal rate and regular rhythm.  Pulmonary:     Effort: Pulmonary effort is normal. No  respiratory distress.     Breath sounds: No stridor. Rales present. No wheezing.  Abdominal:     General: Bowel sounds are normal. There is no distension.     Palpations: Abdomen is soft.     Tenderness: There is no abdominal tenderness. There is no guarding or rebound.  Musculoskeletal:        General: No tenderness.     Cervical back: Neck supple.     Right lower leg: Edema present.     Left lower leg: Edema present.  Skin:    General: Skin is warm and dry.  Findings: No rash.  Neurological:     Mental Status: She is alert.     Cranial Nerves: No cranial nerve deficit (no facial droop, extraocular movements intact, no slurred speech).     Sensory: No sensory deficit.     Motor: No abnormal muscle tone or seizure activity.     Coordination: Coordination normal.     ED Results / Procedures / Treatments   Labs (all labs ordered are listed, but only abnormal results are displayed) Labs Reviewed  BASIC METABOLIC PANEL - Abnormal; Notable for the following components:      Result Value   Chloride 86 (*)    CO2 42 (*)    Glucose, Bld 143 (*)    BUN 28 (*)    Creatinine, Ser 1.01 (*)    Calcium 8.6 (*)    GFR calc non Af Amer 53 (*)    All other components within normal limits  CBC - Abnormal; Notable for the following components:   WBC 17.0 (*)    Hemoglobin 11.1 (*)    MCHC 28.1 (*)    All other components within normal limits  BRAIN NATRIURETIC PEPTIDE - Abnormal; Notable for the following components:   B Natriuretic Peptide 101.0 (*)    All other components within normal limits  BLOOD GAS, ARTERIAL - Abnormal; Notable for the following components:   pH, Arterial 7.195 (*)    pCO2 arterial 117 (*)    pO2, Arterial 80.9 (*)    Bicarbonate 43.6 (*)    Acid-Base Excess 11.8 (*)    All other components within normal limits  BLOOD GAS, ARTERIAL  TRIGLYCERIDES  POC SARS CORONAVIRUS 2 AG -  ED  TROPONIN I (HIGH SENSITIVITY)  TROPONIN I (HIGH SENSITIVITY)     EKG EKG Interpretation  Date/Time:  Wednesday March 19 2019 11:31:24 EST Ventricular Rate:  93 PR Interval:    QRS Duration: 99 QT Interval:  382 QTC Calculation: 476 R Axis:   -21 Text Interpretation: Sinus rhythm Prolonged PR interval Borderline left axis deviation Nonspecific T abnormalities, lateral leads ECG changes noted on prior ECG resolved.  NO STEMI, no afib,  likely artifact on prior ECG Confirmed by Dorie Rank [phone]) on 03/11/2019 11:33:21 AM   Radiology Cardiac event monitor  Result Date: 03/18/2019  Sinus rhythm the entire monitoring time, HR ranged from 59-101 BPM.  1. AVB. No pauses , no arrhythmias.  Normal 14 day monitor, no evidence for atrial fibrillation or paroxysmal SVT.   DG Chest Port 1 View  Result Date: 03/17/2019 CLINICAL DATA:  Dyspnea. EXAM: PORTABLE CHEST 1 VIEW COMPARISON:  Chest radiograph 02/27/2019 FINDINGS: Unchanged position of a left-sided chest tube. Overlying cardiac monitoring leads. The cardiac silhouette is largely obscured. Aortic atherosclerosis. Bilateral pleural effusions are at least moderate in volume and appear slightly increased from prior examination. Associated bibasilar atelectasis and/or consolidation. No evidence of pneumothorax. No acute bony abnormality. IMPRESSION: Unchanged position of a left-sided chest tube.  No pneumothorax. Bilateral pleural effusions appear slightly increased as compared to prior exam 02/19/2019 and are likely at least moderate in volume. Associated bibasilar atelectasis and/or consolidation. Electronically Signed   By: Kellie Simmering DO   On: 03/28/2019 11:52    Procedures Procedure Name: Intubation Date/Time: 03/23/2019 3:00 PM Performed by: Dorie Rank, MD Pre-anesthesia Checklist: Patient identified, Patient being monitored, Emergency Drugs available, Timeout performed and Suction available Oxygen Delivery Method: Non-rebreather mask Preoxygenation: Pre-oxygenation with 100% oxygen Induction  Type: Rapid sequence Ventilation: Mask ventilation without  difficulty Laryngoscope Size: Glidescope Grade View: Grade I Tube size: 7.5 mm Number of attempts: 1 Airway Equipment and Method: Video-laryngoscopy Placement Confirmation: ETT inserted through vocal cords under direct vision,  CO2 detector and Breath sounds checked- equal and bilateral Tube secured with: ETT holder Dental Injury: Teeth and Oropharynx as per pre-operative assessment     .Critical Care Performed by: Dorie Rank, MD Authorized by: Dorie Rank, MD   Critical care provider statement:    Critical care time (minutes):  35   Critical care was time spent personally by me on the following activities:  Discussions with consultants, evaluation of patient's response to treatment, examination of patient, ordering and performing treatments and interventions, ordering and review of laboratory studies, ordering and review of radiographic studies, pulse oximetry, re-evaluation of patient's condition, obtaining history from patient or surrogate and review of old charts   (including critical care time)  Medications Ordered in ED Medications  cefTRIAXone (ROCEPHIN) 1 g in sodium chloride 0.9 % 100 mL IVPB (has no administration in time range)  azithromycin (ZITHROMAX) 500 mg in sodium chloride 0.9 % 250 mL IVPB (has no administration in time range)  fentaNYL (SUBLIMAZE) injection 50 mcg (has no administration in time range)  fentaNYL 2553mcg in NS 221mL (104mcg/ml) infusion-PREMIX (has no administration in time range)  fentaNYL (SUBLIMAZE) bolus via infusion 25 mcg (has no administration in time range)  propofol (DIPRIVAN) 1000 MG/100ML infusion (has no administration in time range)  albuterol (VENTOLIN HFA) 108 (90 Base) MCG/ACT inhaler 4 puff (4 puffs Inhalation Given 03/17/2019 1154)    ED Course  I have reviewed the triage vital signs and the nursing notes.  Pertinent labs & imaging results that were available during my care  of the patient were reviewed by me and considered in my medical decision making (see chart for details).  Clinical Course as of Mar 18 1501  Wed Mar 19, 2019  1135 Pleurex catheter accessed and hooked to suction.  Minimal amount drained.   [id number] Pt continues to have labored breathing.  Appears somnolent.  Will obtain ABG   [JK]  1247 Discussed with Dr Pascal Lux prior imaging and persistent effusions today.  May be loculated.  Could consider doing bilateral thoracentesis.  CT chest will be helpful.  Plan on CT to rule out PE, further characterize the effusions to help determine treatment   [JK]  1418 Labs notable for respiratory acidosis.  I will start the patient on BiPAP.  Her Covid was negative.  CT scan is pending   [JK]  [id number] Pt is more somnolent.  Hard to arouse.  Will proceed with intubation   [JK]  1502 Contacted the patient's son and updated him on the patient's status and the need for intubation.   [JK]    Clinical Course User Index [JK] Dorie Rank, MD   MDM Rules/Calculators/A&P                      Patient presented to ED for evaluation of acute on chronic respiratory difficulty.  Patient has complicated medical history including metastatic cancer and recurrent pleural effusions.  Patient was noted to be tachypneic with labored breathing.  She was slightly somnolent initially.  Patient has a Pleurx catheter and I attempted to access her catheter and drain her pleural effusion.  Unfortunately there was only a minimal amount.,  Maybe 10 to 15 cc.  Patient's ABG showed a respiratory acidosis.  Patient's mental status declined while she was  in the ED therefore did not feel that BiPAP was appropriate.  She was intubated without difficulty.  Follow-up chest x-ray is pending.  We will also proceed with CT scan.  Admit to critical care service for further treatment.  Final Clinical Impression(s) / ED Diagnoses Final diagnoses:  Bilateral pleural effusion  Acute hypercapnic respiratory  failure ([place])  Metastatic malignant neoplasm, unspecified site [facility])      Dorie Rank, MD 03/07/2019 [phone]

## 2019-03-19 NOTE — ED Notes (Signed)
Unsucessful IV attempt by this RN.  Another staff RN to attempt with [place].

## 2019-03-19 NOTE — ED Provider Notes (Signed)
9:00 PM I was called to the ICU to assist with adjustment of the patient's central venous catheter, which on x-ray was demonstrated to be positioned approximately 5 cm deep.  Under sterile conditions a removed appropriate length of the catheter, sutured into place.  Procedure well-tolerated.   Carmin Muskrat, MD 03/10/2019 2102

## 2019-03-19 NOTE — ED Notes (Signed)
Date and time results received: 03/11/2019 1:31 PM  (use smartphrase ".now" to insert current time)  Test: ph 7.195 , pCO2 117  Name of Provider Notified: Tomi Bamberger  Orders Received? Or Actions Taken?: Orders Received - See Orders for details

## 2019-03-19 NOTE — ED Notes (Signed)
IV team at bedside 

## 2019-03-19 NOTE — ED Notes (Signed)
RT titrated pt's O2 down to 2L.  This writer was passing by the room and heard the pt screaming for help and O2 was 68%.  Pt's O2 titrated back to 4L.  Pt's O2 improved to above 95%.  Dr. Tomi Bamberger and RT made aware.  Pt is still breathing 30x/min.

## 2019-03-19 NOTE — Progress Notes (Signed)
PHARMACY NOTE -  Rocephin, Zithromax  Pharmacy has been assisting with dosing of Rocephin and azithromycin for CAP.  Rocephin 2g IV q24 hr - opted for high dose given critical illness in elderly patient  Azithromycin 500 mg IV daily  Neither drug is renally dosed  Pharmacy will sign off, following peripherally for culture results or dose adjustments. Please reconsult if a change in clinical status warrants re-evaluation of dosage.  Reuel Boom, PharmD, BCPS [phone] 03/26/2019, 4:20 PM

## 2019-03-19 NOTE — ED Triage Notes (Signed)
Arrives via EMS from home C/o shob x 2 days Tachypnic at 40 RR Hx of copd, anxiety, lung cancer, UTI Chronic o2 use - 2LNC  Pt reports she gets a thoracentesis every week. Last procedure 2/10. Pt states she was supposed to have one today.

## 2019-03-19 NOTE — ED Notes (Signed)
Tomi Bamberger, MD, Lattie Haw RT, [place], [place], & [place] G, RN at bedside for intubation. Tomi Bamberger called son for consent at 49. Time out at 1445. 100 succ, 15 etomidate given IV at 1447. 1448 intubated with color change, 7.5 at 24 cm at the lip. Bilateral breath sounds at 1149. Temp foley at 1455, OG at 1455.

## 2019-03-19 NOTE — Progress Notes (Signed)
[place] Progress Note Patient Name: Beverly Tucker DOB: Oct 05, 1940 MRN: [id number]   Date of Service  03/22/2019  HPI/Events of Note  Central line tip now in the distal SVC ./ proximal right atrium  eICU Interventions  Okay to use catheter, any further adjustments can be done by PCCM daytime  Attending physician.        Frederik Pear 03/13/2019, 9:33 PM

## 2019-03-19 NOTE — Progress Notes (Addendum)
CRITICAL VALUE ALERT  Critical Value:  Lactic acid 2.0  Date & Time Notied:  03/25/2019  Provider Notified: Lucile Shutters, MD  Orders Received/Actions taken:

## 2019-03-19 NOTE — H&P (Addendum)
NAME:  Beverly Tucker, MRN:  [id number], DOB:  Nov 04, 1940, LOS: 0 ADMISSION DATE:  03/08/2019, CONSULTATION DATE:  03/13/2019 REFERRING MD:  EDP, CHIEF COMPLAINT: Acute respiratory  Brief History   79 year old with breast cancer, chronic diastolic heart failure, A. fib, CKD stage III, malignant pleural effusion presenting with hypercarbic respiratory failure, dyspnea.  Has bilateral effusions with Pleurx on the left placed by IR on 11/08/2018 with weekly drainage. Per family she gets about 400 cc of drainage each time with decreasing output over the last few weeks. Last drainage was 1 week ago. Today Pleurx drainage was attempted in the ED with minimal output (10-15cc).  She was intubated in the ED for altered mental status and PCCM called for admission  Past Medical History    has a past medical history of Anemia, Cancer ([place]), Chronic diastolic CHF (congestive heart failure) ([place]), CKD (chronic kidney disease), stage III, First degree AV block, GERD (gastroesophageal reflux disease), Hypertension, Malignant pleural effusion, Metastatic breast cancer ([place]), PAF (paroxysmal atrial fibrillation) ([place]), PSVT (paroxysmal supraventricular tachycardia) ([place]), and Tachy-brady syndrome ([place]).   Breast cancer diagnosed in September 2020.  Currently on letrozole therapy  Significant Hospital Events   2/17- Admit  Consults:  PCCM  Procedures:  ETT 03/18/2019  Significant Diagnostic Tests:  Chest x-ray 03/13/2019-bilateral effusions with basilar airspace disease.  Micro Data:  Blood Sputum Urine  Antimicrobials:  Ceftriaxone Azithromycin  Interim history/subjective:    Objective   Blood pressure (!) 158/66, pulse 80, temperature 98.7 F (37.1 C), temperature source Oral, resp. rate 15, SpO2 100 %.    Vent Mode: PRVC FiO2 (%):  [100 %] 100 % Set Rate:  [22 bmp] 22 bmp Vt Set:  [450 mL] 450 mL PEEP:  [5 cmH20] 5 cmH20 Plateau Pressure:  [25 cmH20] 25 cmH20  No intake or output data in  the 24 hours ending 03/18/2019 1508 There were no vitals filed for this visit.  Examination: Gen:      No acute distress elderly woman HEENT:  EOMI, sclera anicteric Neck:     No masses; no thyromegaly, ETT Lungs:    Clear to auscultation bilaterally; normal respiratory effort, dressing over the underside of right breast CV:         Regular rate and rhythm; no murmurs Abd:      + bowel sounds; soft, non-tender; no palpable masses, no distension Ext:    No edema; adequate peripheral perfusion Skin:      Warm and dry; no rash Neuro: Sedated, unresponsive  Resolved Hospital Problem list     Assessment & Plan:  Acute hypercarbic respiratory failure  Secondary to bilateral pleural effusions, CHF May have a component of underlying pneumonia  Follow SARS-CoV-2 test Check ABG post intubation CTA to evaluate pulmonary embolism The Pleurx is not draining at present.  Will evaluate position on CT scan and presence of loculation.  May need bilateral thoracentesis and/or new Pleurx catheters based on review of imaging Ceftriaxone, azithromycin for antibiotic coverage  Paroxysmal atrial fibrillation, acute on chronic diastolic heart failure Lasix for diuresis Continue amiodarone Heparin gtt in place of outpatient Eliquis  AKI on CKD 3 Monitor urine output and creatinine  Metastatic breast cancer Continue letrozole  Best practice:  Diet: NPO Pain/Anxiety/Delirium protocol (if indicated): Fentanyl, propofol VAP protocol (if indicated): Ordered DVT prophylaxis: Heparin GI prophylaxis: Pepcid Glucose control: Monitor Mobility: Bed Code Status: Full Family Communication: Pending.  Call son but got voicemail Disposition: ICU  Labs   CBC: Recent Labs  Lab 03/12/2019 1257  WBC 17.0*  HGB 11.1*  HCT 39.5  MCV 96.1  PLT 99991111    Basic Metabolic Panel: Recent Labs  Lab 03/14/2019 1257  NA 137  K 5.1  CL 86*  CO2 42*  GLUCOSE 143*  BUN 28*  CREATININE 1.01*  CALCIUM 8.6*    GFR: Estimated Creatinine Clearance: 46.2 mL/min (A) (by C-G formula based on SCr of 1.01 mg/dL (H)). Recent Labs  Lab 03/04/2019 1257  WBC 17.0*    Liver Function Tests: No results for input(s): AST, ALT, ALKPHOS, BILITOT, PROT, ALBUMIN in the last 168 hours. No results for input(s): LIPASE, AMYLASE in the last 168 hours. No results for input(s): AMMONIA in the last 168 hours.  ABG    Component Value Date/Time   PHART 7.195 (LL) 03/23/2019 1315   PCO2ART 117 (HH) 03/16/2019 1315   PO2ART 80.9 (L) 03/18/2019 1315   HCO3 43.6 (H) 03/14/2019 1315   O2SAT 93.8 03/21/2019 1315     Coagulation Profile: No results for input(s): INR, PROTIME in the last 168 hours.  Cardiac Enzymes: No results for input(s): CKTOTAL, CKMB, CKMBINDEX, TROPONINI in the last 168 hours.  HbA1C: Hgb A1c MFr Bld  Date/Time Value Ref Range Status  11/17/2018 12:31 AM 6.1 (H) 4.8 - 5.6 % Final    Comment:    (NOTE)         Prediabetes: 5.7 - 6.4         Diabetes: >6.4         Glycemic control for adults with diabetes: <7.0     CBG: No results for input(s): GLUCAP in the last 168 hours.  Review of Systems:   Unable to obtain patient is intubated  Past Medical History  She,  has a past medical history of Anemia, Cancer ([place]), Chronic diastolic CHF (congestive heart failure) ([place]), CKD (chronic kidney disease), stage III, First degree AV block, GERD (gastroesophageal reflux disease), Hypertension, Malignant pleural effusion, Metastatic breast cancer ([place]), PAF (paroxysmal atrial fibrillation) ([place]), PSVT (paroxysmal supraventricular tachycardia) ([place]), and Tachy-brady syndrome ([place]).   Surgical History    Past Surgical History:  Procedure Laterality Date  . CHOLECYSTECTOMY    . IR PERC PLEURAL DRAIN W/INDWELL CATH W/IMG GUIDE  11/08/2018  . IR THORACENTESIS ASP PLEURAL SPACE W/IMG GUIDE  10/22/2018  . IR THORACENTESIS ASP PLEURAL SPACE W/IMG GUIDE  12/25/2018     Social History   reports that  she has never smoked. She has never used smokeless tobacco. She reports that she does not drink alcohol or use drugs.   Family History   Her family history includes Diabetes Mellitus II in an other family member.   Allergies No Known Allergies   Home Medications  Prior to Admission medications   Medication Sig Start Date End Date Taking? Authorizing Provider  acetaminophen (TYLENOL) 500 MG tablet Take 1,000 mg by mouth every 6 (six) hours as needed.    [provider]  ALPRAZolam Duanne Moron) 0.5 MG tablet Take 0.5-1 tablets (0.25-0.5 mg total) by mouth 2 (two) times daily as needed for anxiety or sleep. 12/31/18   Roxan Hockey, MD  amiodarone (PACERONE) 200 MG tablet Take 1 tablet (200 mg total) by mouth daily. 01/10/19   Dunn, Nedra Hai, PA-C  apixaban (ELIQUIS) 5 MG TABS tablet Take 1 tablet (5 mg total) by mouth 2 (two) times daily. 10/23/18   Jeanmarie Hubert, MD  ergocalciferol (VITAMIN D2) 1.25 MG (50000 UT) capsule Take 1 capsule (50,000 Units total) by mouth  once a week. 02/03/19   Brunetta Genera, MD  furosemide (LASIX) 40 MG tablet Take 1 and 1/2 tabs daily to = 60 mg daily 01/10/19   Dunn, Nedra Hai, PA-C  hydrOXYzine (ATARAX/VISTARIL) 25 MG tablet Take 1 tablet (25 mg total) by mouth 3 (three) times daily as needed for anxiety (for anxiety and insomnia/sleep). 12/31/18   Roxan Hockey, MD  letrozole (FEMARA) 2.5 MG tablet Take 1 tablet (2.5 mg total) by mouth daily. 02/03/19   Brunetta Genera, MD  loperamide (IMODIUM) 2 MG capsule Take 1 capsule (2 mg total) by mouth as needed for diarrhea or loose stools. 11/24/18   Dessa Phi, DO  mirtazapine (REMERON) 15 MG tablet Take 1 tablet (15 mg total) by mouth at bedtime. 12/31/18   Roxan Hockey, MD  ondansetron (ZOFRAN ODT) 4 MG disintegrating tablet Take 1 tablet (4 mg total) by mouth every 8 (eight) hours as needed for nausea or vomiting. 12/20/18   Donne Hazel, MD  potassium chloride (KLOR-CON) 10 MEQ tablet Take  2 tablets (20 mEq total) by mouth daily. 01/10/19   Charlie Pitter, PA-C     Critical care time:    The patient is critically ill with multiple organ system failure and requires high complexity decision making for assessment and support, frequent evaluation and titration of therapies, advanced monitoring, review of radiographic studies and interpretation of complex data.   Critical Care Time devoted to patient care services, exclusive of separately billable procedures, described in this note is 35 minutes.   [name] MD [facility] Please see [email or web address] for pager details.  03/24/2019, 4:00 PM

## 2019-03-19 NOTE — Procedures (Addendum)
Central Venous Catheter Insertion Procedure Note Beverly Tucker [id number] 19-Sep-1940  Procedure: Insertion of Central Venous Catheter Indications: Assessment of intravascular volume and Drug and/or fluid administration  Procedure Details Consent: Risks of procedure as well as the alternatives and risks of each were explained to the (patient/caregiver).  Consent for procedure obtained. Time Out: Verified patient identification, verified procedure, site/side was marked, verified correct patient position, special equipment/implants available, medications/allergies/relevent history reviewed, required imaging and test results available.  Performed  Maximum sterile technique was used including antiseptics, cap, gloves, gown, hand hygiene, mask and sheet. Skin prep: Chlorhexidine; local anesthetic administered A antimicrobial bonded/coated triple lumen catheter was placed in the left internal jugular vein using the Seldinger technique.  Evaluation Blood flow good Complications: No apparent complications Patient did tolerate procedure well. Chest X-ray ordered to verify placement.  CXR: pending.     Beverly Garfinkel MD [facility] Please see [email or web address] for pager details.  03/06/2019, 4:57 PM

## 2019-03-20 ENCOUNTER — Other Ambulatory Visit: Payer: Self-pay

## 2019-03-20 ENCOUNTER — Inpatient Hospital Stay (HOSPITAL_COMMUNITY): Payer: Medicare HMO

## 2019-03-20 DIAGNOSIS — J90 Pleural effusion, not elsewhere classified: Secondary | ICD-10-CM

## 2019-03-20 LAB — PROTEIN, PLEURAL OR PERITONEAL FLUID: Total protein, fluid: 4 g/dL

## 2019-03-20 LAB — BLOOD GAS, ARTERIAL
Acid-Base Excess: 14.7 mmol/L — ABNORMAL HIGH (ref 0.0–2.0)
Bicarbonate: 36.9 mmol/L — ABNORMAL HIGH (ref 20.0–28.0)
Bicarbonate: 38.2 mmol/L — ABNORMAL HIGH (ref 20.0–28.0)
Drawn by: 308601
FIO2: 40
FIO2: 40
MECHVT: 450 mL
O2 Saturation: 100 %
O2 Saturation: 100 %
Patient temperature: 99.6
Patient temperature: 98.6
RATE: 18 resp/min
pCO2 arterial: 31.1 mmHg — ABNORMAL LOW (ref 32.0–48.0)
pCO2 arterial: 35 mmHg (ref 32.0–48.0)
pH, Arterial: 7.676 (ref 7.350–7.450)
pH, Arterial: 7.64 (ref 7.350–7.450)
pO2, Arterial: 131 mmHg — ABNORMAL HIGH (ref 83.0–108.0)
pO2, Arterial: 110 mmHg — ABNORMAL HIGH (ref 83.0–108.0)

## 2019-03-20 LAB — LACTATE DEHYDROGENASE: LDH: 126 U/L (ref 98–192)

## 2019-03-20 LAB — CBC
HCT: 31.5 % — ABNORMAL LOW (ref 36.0–46.0)
Hemoglobin: 9 g/dL — ABNORMAL LOW (ref 12.0–15.0)
MCH: 26.5 pg (ref 26.0–34.0)
MCHC: 28.6 g/dL — ABNORMAL LOW (ref 30.0–36.0)
MCV: 92.9 fL (ref 80.0–100.0)
Platelets: 367 10*3/uL (ref 150–400)
RBC: 3.39 MIL/uL — ABNORMAL LOW (ref 3.87–5.11)
RDW: 15.1 % (ref 11.5–15.5)
WBC: 12.4 10*3/uL — ABNORMAL HIGH (ref 4.0–10.5)
nRBC: 0 % (ref 0.0–0.2)

## 2019-03-20 LAB — LACTIC ACID, PLASMA
Lactic Acid, Venous: 1.7 mmol/L (ref 0.5–1.9)
Lactic Acid, Venous: 1.9 mmol/L (ref 0.5–1.9)
Lactic Acid, Venous: 1.7 mmol/L (ref 0.5–1.9)

## 2019-03-20 LAB — LACTATE DEHYDROGENASE, PLEURAL OR PERITONEAL FLUID: LD, Fluid: 114 U/L — ABNORMAL HIGH (ref 3–23)

## 2019-03-20 LAB — BASIC METABOLIC PANEL
Anion gap: 11 (ref 5–15)
BUN: 31 mg/dL — ABNORMAL HIGH (ref 8–23)
CO2: 34 mmol/L — ABNORMAL HIGH (ref 22–32)
Calcium: 7.6 mg/dL — ABNORMAL LOW (ref 8.9–10.3)
Chloride: 93 mmol/L — ABNORMAL LOW (ref 98–111)
Creatinine, Ser: 1.17 mg/dL — ABNORMAL HIGH (ref 0.44–1.00)
GFR calc Af Amer: 51 mL/min — ABNORMAL LOW (ref >60)
GFR calc non Af Amer: 44 mL/min — ABNORMAL LOW (ref >60)
Glucose, Bld: 115 mg/dL — ABNORMAL HIGH (ref 70–99)
Potassium: 3.8 mmol/L (ref 3.5–5.1)
Sodium: 138 mmol/L (ref 135–145)

## 2019-03-20 LAB — URINE CULTURE: Culture: NO GROWTH

## 2019-03-20 LAB — BODY FLUID CELL COUNT WITH DIFFERENTIAL
Eos, Fluid: 0 %
Lymphs, Fluid: 41 %
Monocyte-Macrophage-Serous Fluid: 46 % — ABNORMAL LOW (ref 50–90)
Neutrophil Count, Fluid: 13 % (ref 0–25)
Total Nucleated Cell Count, Fluid: 882 cu mm (ref 0–1000)

## 2019-03-20 LAB — PROCALCITONIN: Procalcitonin: 0.23 ng/mL

## 2019-03-20 LAB — STREP PNEUMONIAE URINARY ANTIGEN: Strep Pneumo Urinary Antigen: NEGATIVE

## 2019-03-20 LAB — PHOSPHORUS: Phosphorus: 1.1 mg/dL — ABNORMAL LOW (ref 2.5–4.6)

## 2019-03-20 LAB — PROTEIN, TOTAL: Total Protein: 5.6 g/dL — ABNORMAL LOW (ref 6.5–8.1)

## 2019-03-20 LAB — ALBUMIN: Albumin: 2.5 g/dL — ABNORMAL LOW (ref 3.5–5.0)

## 2019-03-20 LAB — ALBUMIN, PLEURAL OR PERITONEAL FLUID: Albumin, Fluid: 2.4 g/dL

## 2019-03-20 LAB — GLUCOSE, PLEURAL OR PERITONEAL FLUID: Glucose, Fluid: 99 mg/dL

## 2019-03-20 LAB — MAGNESIUM: Magnesium: 1.9 mg/dL (ref 1.7–2.4)

## 2019-03-20 MED ORDER — VANCOMYCIN HCL 750 MG/150ML IV SOLN
750.0000 mg | INTRAVENOUS | Status: DC
  Administered 2019-03-21 – 2019-03-24 (×4): 750 mg via INTRAVENOUS
  Filled 2019-03-20 (×5): qty 150

## 2019-03-20 MED ORDER — FAMOTIDINE 40 MG/5ML PO SUSR
20.0000 mg | Freq: Every day | ORAL | Status: DC
  Administered 2019-03-21 – 2019-03-29 (×9): 20 mg
  Filled 2019-03-20 (×10): qty 2.5

## 2019-03-20 MED ORDER — ALBUMIN HUMAN 5 % IV SOLN
12.5000 g | Freq: Once | INTRAVENOUS | Status: AC
  Administered 2019-03-20: 05:00:00 12.5 g via INTRAVENOUS
  Filled 2019-03-20: qty 250

## 2019-03-20 MED ORDER — VANCOMYCIN HCL 1500 MG/300ML IV SOLN
1500.0000 mg | Freq: Once | INTRAVENOUS | Status: AC
  Administered 2019-03-20: 1500 mg via INTRAVENOUS
  Filled 2019-03-20: qty 300

## 2019-03-20 NOTE — Progress Notes (Signed)
Initial Nutrition Assessment  RD working remotely.   DOCUMENTATION CODES:   Not applicable  INTERVENTION:  - once TF feasible, recommend Vital AF 1.2 @ 25 ml/hr to advance by 10 ml every 12 hours to reach goal rate of 45 ml/hr with 30 ml prostat BID. - at goal rate, this regimen will provide 1496 kcal, 111 grams protein, and 876 ml free water.   Monitor magnesium, potassium, and phosphorus daily for at least 3 days, MD to replete as needed, as pt is at risk for refeeding syndrome given suspected ongoing malnutrition, current hypophosphatemia.   NUTRITION DIAGNOSIS:   Inadequate oral intake related to inability to eat as evidenced by NPO status.  GOAL:   Patient will meet greater than or equal to 90% of their needs  MONITOR:   Vent status, Labs, Weight trends  REASON FOR ASSESSMENT:   Ventilator  ASSESSMENT:   79 year old female with medical history of breast cancer, CHF, A. fib, stage 3 CKD, and malignant pleural effusion. She presented to the ED with hypercarbic respiratory failure and dyspnea. She has bilateral effusions with Pleurx on the left placed by IR on 11/08/2018 with weekly drainage. Per family, she gets about 400 cc of drainage each time with decreasing output over the last few weeks. Last drainage was 1 week ago. Today Pleurx drainage was attempted in the ED with minimal output (10-15cc).  She was intubated in the ED for AMS.  Patient remains intubated with OGT in place. OGT currently to LIS to 0 ml output documented.  Per chart review, patient was last seen be a RD in October at which time she met criteria for moderate/non-severe malnutrition in the context of chronic illness as evidenced by mild muscle depletion and percent weight loss.   Weight today is 167 lb and weight has trended down since October so anticipate that malnutrition is ongoing. Weight on 1/26 was 171 lb which indicates 4 lb weight loss (2.3% body weight) in the past 3 weeks; not significant for  time frame.   Per notes: - AKI on stage 3 CKD - metastatic breast cancer    Patient is currently intubated on ventilator support MV: 6 L/min Temp (24hrs), Avg:99.3 F (37.4 C), Min:97.5 F (36.4 C), Max:100 F (37.8 C) Propofol: 20.6 ml/hr (544 kcal)   Labs reviewed; Cl: 93 mmol/l, BUN: 31 mg/dl, creatinine: 1.17 mg/dl, Ca: 7.6 mg/dl, Phos: 1.1 mg/dl, GFR: 44 ml/min. Medications reviewed; 20 mg IV pepcid BID, 40 mg IV lasix BID. Drips; propofol @ 45 mcg/kg/hr, levo @ 8 mcg/min.     NUTRITION - FOCUSED PHYSICAL EXAM:  unable to complete at this time.   Diet Order:   Diet Order            Diet NPO time specified  Diet effective now              EDUCATION NEEDS:   No education needs have been identified at this time  Skin:  Skin Assessment: Reviewed RN Assessment  Last BM:  PTA/unknown  Height:   Ht Readings from Last 1 Encounters:  03/17/19 '5\' 5"'$  (1.651 m)    Weight:   Wt Readings from Last 1 Encounters:  03/20/19 75.8 kg    Ideal Body Weight:  56.8 kg  BMI:  Body mass index is 27.81 kg/m.  Estimated Nutritional Needs:   Kcal:  1475 kcal  Protein:  91-114 grams  Fluid:  >/= 1.8 L/day     Jarome Matin, MS, RD, LDN, CNSC Inpatient  Clinical Dietitian RD pager # available in [facility]  After hours/weekend pager # available in [facility]

## 2019-03-20 NOTE — Procedures (Signed)
Thoracentesis Procedure Note  Pre-operative Diagnosis: Moderate right pleural effusion   Post-operative Diagnosis: same  Indications: Moderate right pleural effusion   Procedure Details  Consent: Informed consent was obtained. Risks of the procedure were discussed including: infection, bleeding, pain, pneumothorax.  Under sterile conditions the patient was positioned. Betadine solution and sterile drapes were utilized.  1% plain lidocaine was used to anesthetize the appropriate rib space at largest fluid pocket seen by ultrasound. Fluid was obtained without any difficulties and minimal blood loss.  A dressing was applied to the wound and wound care instructions were provided.   Findings 1000 ml of Pink tinged pleural fluid was obtained. A sample was sent to Pathology for cytogenetics, flow, and cell counts, as well as for infection analysis.  Complications:  None; patient tolerated the procedure well.          Condition: stable  Plan A follow up chest x-ray was ordered. Bed Rest for 1 hours. Tylenol 650 mg. for pain.  Johnsie Cancel, NP-C [facility] Contact / Pager information can be found on Amion  03/20/2019, 11:42 AM

## 2019-03-20 NOTE — Progress Notes (Signed)
NAME:  Beverly Tucker, MRN:  [id number], DOB:  12-25-1940, LOS: 1 ADMISSION DATE:  03/24/2019, CONSULTATION DATE:  03/13/2019 REFERRING MD:  EDP, CHIEF COMPLAINT: Acute respiratory  Brief History   78 year old with breast cancer, chronic diastolic heart failure, A. fib, CKD stage III, malignant pleural effusion presenting with hypercarbic respiratory failure, dyspnea.  Has bilateral effusions with Pleurx on the left placed by IR on 11/08/2018 with weekly drainage. Per family she gets about 400 cc of drainage each time with decreasing output over the last few weeks. Last drainage was 1 week ago. Today Pleurx drainage was attempted in the ED with minimal output (10-15cc).  She was intubated in the ED for altered mental status and PCCM called for admission  Past Medical History    has a past medical history of Anemia, Cancer ([place]), Chronic diastolic CHF (congestive heart failure) ([place]), CKD (chronic kidney disease), stage III, First degree AV block, GERD (gastroesophageal reflux disease), Hypertension, Malignant pleural effusion, Metastatic breast cancer ([place]), PAF (paroxysmal atrial fibrillation) ([place]), PSVT (paroxysmal supraventricular tachycardia) ([place]), and Tachy-brady syndrome ([place]).   Breast cancer diagnosed in September 2020.  Currently on letrozole therapy  Significant Hospital Events   2/17- Admit  Consults:  PCCM  Procedures:  ETT 03/23/2019  Significant Diagnostic Tests:   CT chest 03/14/2019-no pulmonary embolism, increase in size of bilateral pleural effusions with right middle lobe airspace density.  I have reviewed the images personally.  Micro Data:  Blood Sputum Urine  Antimicrobials:  Ceftriaxone 2/18 >> Azithromycin 2/18 >>  Interim history/subjective:  Remains on the ventilator, hemodynamically stable  Objective   Blood pressure (!) 116/40, pulse (!) 58, temperature 98.8 F (37.1 C), resp. rate 16, weight 75.8 kg, SpO2 100 %.    Vent Mode: PRVC FiO2 (%):  [40  %-100 %] 40 % Set Rate:  [16 bmp-22 bmp] 16 bmp Vt Set:  [380 mL-450 mL] 380 mL PEEP:  [5 cmH20] 5 cmH20 Plateau Pressure:  [18 cmH20-27 cmH20] 18 cmH20   Intake/Output Summary (Last 24 hours) at 03/20/2019 0959 Last data filed at 03/20/2019 [id number] Gross per 24 hour  Intake 1771.34 ml  Output 1700 ml  Net 71.34 ml   Filed Weights   03/06/2019 1800 03/20/19 0500  Weight: 76 kg 75.8 kg    Examination: Gen:      No acute distress HEENT:  EOMI, sclera anicteric Neck:     No masses; no thyromegaly, ETT Lungs:    Clear to auscultation bilaterally; normal respiratory effort CV:         Regular rate and rhythm; no murmurs Abd:      + bowel sounds; soft, non-tender; no palpable masses, no distension Ext:    No edema; adequate peripheral perfusion Skin:      Warm and dry; no rash Neuro: Sedated nonresponsive  Resolved Hospital Problem list     Assessment & Plan:  Acute hypercarbic respiratory failure  Secondary to bilateral pleural effusions, CHF May have a component of underlying pneumonia  Plan on right thoracentesis.  If malignant then she may need a Pleurx on the right Attempt to drain left Pleurx again.  If not draining then will need eventual removal Ceftriaxone, azithromycin for antibiotic coverage  Paroxysmal atrial fibrillation, acute on chronic diastolic heart failure Lasix for diuresis Continue amiodarone Holding Eliquis as she is in sinus rhythm  AKI on CKD 3 Monitor urine output and creatinine  Metastatic breast cancer Continue letrozole  Best practice:  Diet: Tube feeds Pain/Anxiety/Delirium protocol (  if indicated): Fentanyl, propofol VAP protocol (if indicated): Ordered DVT prophylaxis: Heparin subcu GI prophylaxis: Pepcid Glucose control: Monitor Mobility: Bed Code Status: Full Family Communication: Son updated in detail on 2/18 Disposition: ICU  Critical care time:    The patient is critically ill with multiple organ system failure and requires high  complexity decision making for assessment and support, frequent evaluation and titration of therapies, advanced monitoring, review of radiographic studies and interpretation of complex data.   Critical Care Time devoted to patient care services, exclusive of separately billable procedures, described in this note is 35 minutes.   [name] MD [facility] Please see [email or web address] for pager details.  03/20/2019, 9:59 AM

## 2019-03-20 NOTE — Progress Notes (Signed)
CRITICAL VALUE ALERT  Critical Value:  PH 7.640   Date & Time Notied:  03/20/2019 0630   Provider Notified: [facility]  Orders Received/Actions taken: waiting for new orders

## 2019-03-20 NOTE — Progress Notes (Signed)
PHARMACY NOTE -  ANTIBIOTIC RENAL DOSE ADJUSTMENT & IV to PO   Pepcid 20 mg IV q12 CrCl ~ 40 ml/min  Plan: change to pepcid 20 mg per tube q24 for CrCl < 50   Eudelia Bunch, Pharm.D [phone] 03/20/2019 11:43 AM

## 2019-03-20 NOTE — Progress Notes (Signed)
Pharmacy Antibiotic Note  Beverly Tucker is a 79 y.o. female with hx breast cancer and malignant pleural effusion with Pleurx who presented to [facility] on 03/18/2019 for evaluation of decreased Pleurx drainage. She was intubated in the ED and started on azithromycin and ceftriaxone for PNA. One bottle from one set of bcx from 2/17 had GPC in clusters.  Pharmacy was consulted on 2/18 to add vancomycin to abx regimen for suspected bacteremia.   Today, 03/20/2019: - Tmax 100, WBC 12.4 - scr up 1.17 (crcl~40) -- was 0.81 on 03/03/19   Plan: - vancomycin 1500 mg IV x1, then 750 mg IV q24h for est AUC 409 - monitor renal function closely and adjust dose as needed  _____________________________________  Weight: 167 lb 1.7 oz (75.8 kg)  Temp (24hrs), Avg:99.1 F (37.3 C), Min:97.5 F (36.4 C), Max:100 F (37.8 C)  Recent Labs  Lab 03/21/2019 1257 03/30/2019 2030 03/20/19 0040 03/20/19 0237 03/20/19 0541  WBC 17.0*  --   --   --  12.4*  CREATININE 1.01*  --   --   --  1.17*  LATICACIDVEN  --  2.0* 1.7 1.9 1.7    Estimated Creatinine Clearance: 39.7 mL/min (A) (by C-G formula based on SCr of 1.17 mg/dL (H)).    No Known Allergies   Thank you for allowing pharmacy to be a part of this patient's care.  Lynelle Doctor 03/20/2019 2:48 PM

## 2019-03-20 NOTE — Progress Notes (Signed)
CRITICAL VALUE ALERT  Critical Value:  PH (ABG) 7.67  Date & Time Notied:  03/20/2019  Provider Notified: Lucile Shutters, MD  Orders Received/Actions taken: waiting orders

## 2019-03-20 NOTE — Progress Notes (Signed)
Attempted to drain patient's left pleurx cath per Dr.Mannam's orders. No fluid was removed. Insertion site was cleaned and new dressing placed.

## 2019-03-20 NOTE — Progress Notes (Signed)
THE DOCTOR ORDERED FOR THE ET TUBE TO BE WITHDRAWN  2CM. THE ET TUBE WAS AT 24.5 AND rt WITHDREW TO 22.5.

## 2019-03-20 NOTE — Progress Notes (Signed)
Received call from e-link, left IJ central line needed to be pulled by about 5 cm.  ER doc performed procedure and resutured line.  RN and NP witnessed proper sterile procedure.  RN redressed line.

## 2019-03-20 NOTE — Progress Notes (Signed)
[facility] Progress Note Patient Name: Beverly Tucker DOB: 04-Feb-1940 MRN: [id number]   Date of Service  03/20/2019  HPI/Events of Note  PH 7.64  eICU Interventions  RR reduced to 16, Tidal volume reduced to 7 ml/kg  From 8 ml/kg.        Kerry Kass Beverly Tucker 03/20/2019, 6:45 AM

## 2019-03-20 NOTE — Progress Notes (Signed)
Spoke with patient's son, Idabell Stultz, after Dr. Vaughan Browner had updated him. He has given telephone consent for a right thoracentesis to be done. Verified consent with two RN's. Consent signed and placed on the chart. [name] verbalized understanding and has no further questions at this time.

## 2019-03-20 NOTE — Progress Notes (Signed)
PHARMACY - PHYSICIAN COMMUNICATION CRITICAL VALUE ALERT - BLOOD CULTURE IDENTIFICATION (BCID)  Beverly Tucker is an 79 y.o. female with hx breast cancer and malignant pleural effusion with Pleurx who presented to [facility] on 03/18/2019 for evaluation of decreased Pleurx drainage. She was intubated in the ED and currently on azithromycin and ceftriaxone for PNA.  Name of physician (or Provider) Contacted: Dr. Vaughan Browner  Current antibiotics: azithromycin and ceftriaxone  Changes to prescribed antibiotics recommended:  - add vancomycin to current abx regimen  Results for orders placed or performed during the hospital encounter of 10/14/18  Blood Culture ID Panel (Reflexed) (Collected: 10/14/2018  2:41 PM)  Result Value Ref Range   Enterococcus species NOT DETECTED NOT DETECTED   Listeria monocytogenes NOT DETECTED NOT DETECTED   Staphylococcus species NOT DETECTED NOT DETECTED   Staphylococcus aureus (BCID) NOT DETECTED NOT DETECTED   Streptococcus species NOT DETECTED NOT DETECTED   Streptococcus agalactiae NOT DETECTED NOT DETECTED   Streptococcus pneumoniae NOT DETECTED NOT DETECTED   Streptococcus pyogenes NOT DETECTED NOT DETECTED   Acinetobacter baumannii NOT DETECTED NOT DETECTED   Enterobacteriaceae species NOT DETECTED NOT DETECTED   Enterobacter cloacae complex NOT DETECTED NOT DETECTED   Escherichia coli NOT DETECTED NOT DETECTED   Klebsiella oxytoca NOT DETECTED NOT DETECTED   Klebsiella pneumoniae NOT DETECTED NOT DETECTED   Proteus species NOT DETECTED NOT DETECTED   Serratia marcescens NOT DETECTED NOT DETECTED   Haemophilus influenzae NOT DETECTED NOT DETECTED   Neisseria meningitidis NOT DETECTED NOT DETECTED   Pseudomonas aeruginosa NOT DETECTED NOT DETECTED   Candida albicans NOT DETECTED NOT DETECTED   Candida glabrata NOT DETECTED NOT DETECTED   Candida krusei NOT DETECTED NOT DETECTED   Candida parapsilosis NOT DETECTED NOT DETECTED   Candida tropicalis NOT  DETECTED NOT DETECTED    Devin Going, Amahia Madonia P 03/20/2019  2:42 PM

## 2019-03-20 NOTE — Progress Notes (Signed)
[facility] Progress Note Patient Name: CAMRYN RISSE DOB: Jun 18, 1940 MRN: [id number]   Date of Service  03/20/2019  HPI/Events of Note  Severe alkalosis which appears to be a combination of a respiratory and metabolic alkalosis.  eICU Interventions  Respiratory rate reduced from 22 to 18, Pt given a fluid bolus with 250 ml of 5 % Albumin x 1, ABG recheck at 6 a.m.        Kerry Kass Dale Strausser 03/20/2019, 4:19 AM

## 2019-03-21 ENCOUNTER — Inpatient Hospital Stay (HOSPITAL_COMMUNITY): Payer: Medicare HMO

## 2019-03-21 ENCOUNTER — Other Ambulatory Visit: Payer: Self-pay

## 2019-03-21 HISTORY — PX: IR INSTILL VIA CHEST TUBE AGENT FOR FIBRINOLYSIS INI DAY: IMG5400

## 2019-03-21 LAB — BASIC METABOLIC PANEL
Anion gap: 11 (ref 5–15)
BUN: 26 mg/dL — ABNORMAL HIGH (ref 8–23)
CO2: 33 mmol/L — ABNORMAL HIGH (ref 22–32)
Calcium: 7.5 mg/dL — ABNORMAL LOW (ref 8.9–10.3)
Chloride: 96 mmol/L — ABNORMAL LOW (ref 98–111)
Creatinine, Ser: 1.22 mg/dL — ABNORMAL HIGH (ref 0.44–1.00)
GFR calc Af Amer: 49 mL/min — ABNORMAL LOW (ref >60)
GFR calc non Af Amer: 42 mL/min — ABNORMAL LOW (ref >60)
Glucose, Bld: 99 mg/dL (ref 70–99)
Potassium: 2.8 mmol/L — ABNORMAL LOW (ref 3.5–5.1)
Sodium: 140 mmol/L (ref 135–145)

## 2019-03-21 LAB — BLOOD GAS, ARTERIAL
Acid-Base Excess: 10.4 mmol/L — ABNORMAL HIGH (ref 0.0–2.0)
Bicarbonate: 34.9 mmol/L — ABNORMAL HIGH (ref 20.0–28.0)
FIO2: 30
O2 Saturation: 97.4 %
Patient temperature: 37
pCO2 arterial: 48 mmHg (ref 32.0–48.0)
pH, Arterial: 7.475 — ABNORMAL HIGH (ref 7.350–7.450)
pO2, Arterial: 74.5 mmHg — ABNORMAL LOW (ref 83.0–108.0)

## 2019-03-21 LAB — PROCALCITONIN: Procalcitonin: 0.13 ng/mL

## 2019-03-21 LAB — CBC
HCT: 29.3 % — ABNORMAL LOW (ref 36.0–46.0)
Hemoglobin: 8.8 g/dL — ABNORMAL LOW (ref 12.0–15.0)
MCH: 26.8 pg (ref 26.0–34.0)
MCHC: 30 g/dL (ref 30.0–36.0)
MCV: 89.3 fL (ref 80.0–100.0)
Platelets: 348 10*3/uL (ref 150–400)
RBC: 3.28 MIL/uL — ABNORMAL LOW (ref 3.87–5.11)
RDW: 15.8 % — ABNORMAL HIGH (ref 11.5–15.5)
WBC: 8.2 10*3/uL (ref 4.0–10.5)
nRBC: 0 % (ref 0.0–0.2)

## 2019-03-21 LAB — LEGIONELLA PNEUMOPHILA SEROGP 1 UR AG: L. pneumophila Serogp 1 Ur Ag: NEGATIVE

## 2019-03-21 LAB — CYTOLOGY - NON PAP

## 2019-03-21 LAB — MAGNESIUM: Magnesium: 2.2 mg/dL (ref 1.7–2.4)

## 2019-03-21 LAB — PHOSPHORUS: Phosphorus: 2.3 mg/dL — ABNORMAL LOW (ref 2.5–4.6)

## 2019-03-21 MED ORDER — SODIUM CHLORIDE (PF) 0.9 % IJ SOLN: 6.0000 mg | Freq: Once | INTRAMUSCULAR | Status: DC

## 2019-03-21 MED ORDER — ACETAMINOPHEN 160 MG/5ML PO SOLN
650.0000 mg | ORAL | Status: DC | PRN
  Administered 2019-03-21 – 2019-03-25 (×10): 650 mg
  Filled 2019-03-21 (×10): qty 20.3

## 2019-03-21 MED ORDER — POTASSIUM CHLORIDE 20 MEQ/15ML (10%) PO SOLN
40.0000 meq | ORAL | Status: AC
  Administered 2019-03-21 (×2): 40 meq
  Filled 2019-03-21 (×2): qty 30

## 2019-03-21 MED ORDER — SODIUM CHLORIDE (PF) 0.9 % IJ SOLN
6.0000 mg | Freq: Once | INTRAMUSCULAR | Status: DC
  Filled 2019-03-21: qty 6

## 2019-03-21 MED ORDER — FUROSEMIDE 10 MG/ML IJ SOLN
80.0000 mg | Freq: Four times a day (QID) | INTRAMUSCULAR | Status: DC
  Administered 2019-03-21 – 2019-03-22 (×3): 80 mg via INTRAVENOUS
  Filled 2019-03-21 (×3): qty 8

## 2019-03-21 MED ORDER — SODIUM PHOSPHATES 45 MMOLE/15ML IV SOLN
10.0000 mmol | Freq: Once | INTRAVENOUS | Status: AC
  Administered 2019-03-21: 10 mmol via INTRAVENOUS
  Filled 2019-03-21: qty 3.33

## 2019-03-21 NOTE — Progress Notes (Signed)
Patient ID: Beverly Tucker, female   DOB: 06/30/1940, 79 y.o.   MRN: [id number] 6 mg TPA in 30 cc NS syringe was instilled into pt's left pleurx cath without immediate complications. Will leave in place for 2 hrs and then attempt aspiration afterwards.

## 2019-03-21 NOTE — Progress Notes (Signed)
Patient ID: Beverly Tucker, female   DOB: 1940/02/07, 79 y.o.   MRN: [id number] Following 2 hr dwell of TPA in left pleural space approx 150 cc amber fluid was removed via pleurx catheter. New dressing applied over site. No immediate complications. Further plans as per CCM.

## 2019-03-21 NOTE — Progress Notes (Addendum)
ABG obtained on PRVC 380, RR 16, Peep 5 & 30%  Results for Beverly Tucker, Beverly Tucker (MRN [id number]) as of 03/21/2019 03:42  Ref. Range 03/21/2019 03:34  FIO2 Unknown 30.00  pH, Arterial Latest Ref Range: 7.350 - 7.450  7.475 (H)  pCO2 arterial Latest Ref Range: 32.0 - 48.0 mmHg 48.0  pO2, Arterial Latest Ref Range: 83.0 - 108.0 mmHg 74.5 (L)  Acid-Base Excess Latest Ref Range: 0.0 - 2.0 mmol/L 10.4 (H)  Bicarbonate Latest Ref Range: 20.0 - 28.0 mmol/L 34.9 (H)  O2 Saturation Latest Units: % 97.4  Patient temperature Unknown 37.0  Allens test (pass/fail) Latest Ref Range: PASS  PASS

## 2019-03-21 NOTE — Progress Notes (Signed)
[facility] Progress Note Patient Name: FIORA MAJCHER DOB: 07/23/1940 MRN: [id number]   Date of Service  03/21/2019  HPI/Events of Note  Pt with temp spike  eICU Interventions  PRN Tylenol via the NG tube ordered.        Kerry Kass Sadeel Fiddler 03/21/2019, 1:57 AM

## 2019-03-21 NOTE — Progress Notes (Addendum)
NAME:  Beverly Tucker, MRN:  [id number], DOB:  01/02/1941, LOS: 2 ADMISSION DATE:  03/28/2019, CONSULTATION DATE:  03/03/2019 REFERRING MD:  EDP, CHIEF COMPLAINT: Acute respiratory  Brief History   79 year old with breast cancer, chronic diastolic heart failure, A. fib, CKD stage III, malignant pleural effusion presenting with hypercarbic respiratory failure, dyspnea.  Has bilateral effusions with Pleurx on the left placed by IR on 11/08/2018 with weekly drainage. Per family she gets about 400 cc of drainage each time with decreasing output over the last few weeks. Last drainage was 1 week ago. Today Pleurx drainage was attempted in the ED with minimal output (10-15cc).  She was intubated in the ED for altered mental status and PCCM called for admission  Past Medical History    has a past medical history of Anemia, Cancer ([place]), Chronic diastolic CHF (congestive heart failure) ([place]), CKD (chronic kidney disease), stage III, First degree AV block, GERD (gastroesophageal reflux disease), Hypertension, Malignant pleural effusion, Metastatic breast cancer ([place]), PAF (paroxysmal atrial fibrillation) ([place]), PSVT (paroxysmal supraventricular tachycardia) ([place]), and Tachy-brady syndrome ([place]).   Breast cancer diagnosed in September 2020.  Currently on letrozole therapy  Significant Hospital Events   2/17- Admit   Consults:  PCCM  Procedures:  ETT 03/04/2019  Thoracentesis-  2/18  Significant Diagnostic Tests:  CT chest 03/18/2019-no pulmonary embolism, increase in size of bilateral pleural effusions with right middle lobe airspace density.  I have reviewed the images personally.  Pleural fluid 2/18-LDH 114, total protein 4.  Cytology, micro pending  Micro Data:  Blood 2/17-GPC's Sputum 2/20-no growth Urine 2/17-no growth Pleural culture 2/18-no growth  Antimicrobials:  Ceftriaxone 2/18 >> Azithromycin 2/18 >>  Interim history/subjective:  Remains on the ventilator, hemodynamically  stable  Objective   Blood pressure (!) 160/58, pulse 71, temperature 99 F (37.2 C), temperature source Bladder, resp. rate (!) 27, weight 76.8 kg, SpO2 98 %.    Vent Mode: PRVC FiO2 (%):  [30 %] 30 % Set Rate:  [16 bmp] 16 bmp Vt Set:  [380 mL] 380 mL PEEP:  [5 cmH20] 5 cmH20 Plateau Pressure:  [19 cmH20-22 cmH20] 21 cmH20   Intake/Output Summary (Last 24 hours) at 03/21/2019 0815 Last data filed at 03/21/2019 0630 Gross per 24 hour  Intake 2198.1 ml  Output 1475 ml  Net 723.1 ml   Filed Weights   03/12/2019 1800 03/20/19 0500 03/21/19 0428  Weight: 76 kg 75.8 kg 76.8 kg    Examination: Blood pressure (!) 160/58, pulse 71, temperature 99 F (37.2 C), temperature source Bladder, resp. rate (!) 27, weight 76.8 kg, SpO2 98 %. Gen:      Elderly female in no distress HEENT:  EOMI, sclera anicteric Neck:     No masses; no thyromegaly ET tube Lungs:    Clear to auscultation bilaterally; normal respiratory effort CV:         Regular rate and rhythm; no murmurs Abd:      + bowel sounds; soft, non-tender; no palpable masses, no distension Ext:    No edema; adequate peripheral perfusion Skin:      Warm and dry; no rash Neuro: Sedated  Labs significant for potassium 2.8, BUN/creatinine 26/1.22  Resolved Hospital Problem list     Assessment & Plan:  Acute hypercarbic respiratory failure  Secondary to bilateral pleural effusions, CHF May have a component of underlying pneumonia  Follow pleural fluid studies Discussed with IR about left Pleurx as it is not draining.  Try indwelling TPA to unclog  Ceftriaxone, azithromycin for antibiotic coverage Added Vanco for 1/2 GPC's in blood.  This is likely a contaminant.  Follow final cultures  Paroxysmal atrial fibrillation, acute on chronic diastolic heart failure Lasix for diuresis.  Increased dose today Continue amiodarone Holding Eliquis as she is in sinus rhythm  AKI on CKD 3 Monitor urine output and creatinine  Metastatic  breast cancer Continue letrozole  Best practice:  Diet: Tube feeds Pain/Anxiety/Delirium protocol (if indicated): Fentanyl, propofol VAP protocol (if indicated): Ordered DVT prophylaxis: Heparin subcu GI prophylaxis: Pepcid Glucose control: Monitor Mobility: Bed Code Status: Full Family Communication: Son updated daily. Disposition: ICU  Critical care time:    The patient is critically ill with multiple organ system failure and requires high complexity decision making for assessment and support, frequent evaluation and titration of therapies, advanced monitoring, review of radiographic studies and interpretation of complex data.   Critical Care Time devoted to patient care services, exclusive of separately billable procedures, described in this note is 35 minutes.   [name] MD [facility] Please see [email or web address] for pager details.  03/21/2019, 8:15 AM

## 2019-03-22 ENCOUNTER — Inpatient Hospital Stay (HOSPITAL_COMMUNITY): Payer: Medicare HMO

## 2019-03-22 DIAGNOSIS — I361 Nonrheumatic tricuspid (valve) insufficiency: Secondary | ICD-10-CM

## 2019-03-22 DIAGNOSIS — I340 Nonrheumatic mitral (valve) insufficiency: Secondary | ICD-10-CM

## 2019-03-22 LAB — CBC
HCT: 29.8 % — ABNORMAL LOW (ref 36.0–46.0)
Hemoglobin: 8.9 g/dL — ABNORMAL LOW (ref 12.0–15.0)
MCH: 26.6 pg (ref 26.0–34.0)
MCHC: 29.9 g/dL — ABNORMAL LOW (ref 30.0–36.0)
MCV: 89 fL (ref 80.0–100.0)
Platelets: 368 10*3/uL (ref 150–400)
RBC: 3.35 MIL/uL — ABNORMAL LOW (ref 3.87–5.11)
RDW: 15.7 % — ABNORMAL HIGH (ref 11.5–15.5)
WBC: 11 10*3/uL — ABNORMAL HIGH (ref 4.0–10.5)
nRBC: 0 % (ref 0.0–0.2)

## 2019-03-22 LAB — CULTURE, RESPIRATORY W GRAM STAIN
Culture: NORMAL
Special Requests: NORMAL

## 2019-03-22 LAB — BASIC METABOLIC PANEL
Anion gap: 14 (ref 5–15)
BUN: 22 mg/dL (ref 8–23)
CO2: 31 mmol/L (ref 22–32)
Calcium: 7.1 mg/dL — ABNORMAL LOW (ref 8.9–10.3)
Chloride: 99 mmol/L (ref 98–111)
Creatinine, Ser: 1.17 mg/dL — ABNORMAL HIGH (ref 0.44–1.00)
GFR calc Af Amer: 51 mL/min — ABNORMAL LOW (ref >60)
GFR calc non Af Amer: 44 mL/min — ABNORMAL LOW (ref >60)
Glucose, Bld: 123 mg/dL — ABNORMAL HIGH (ref 70–99)
Potassium: 3 mmol/L — ABNORMAL LOW (ref 3.5–5.1)
Sodium: 144 mmol/L (ref 135–145)

## 2019-03-22 LAB — MAGNESIUM: Magnesium: 1.9 mg/dL (ref 1.7–2.4)

## 2019-03-22 LAB — ECHOCARDIOGRAM COMPLETE
Height: 65 in
Weight: 2659.63 oz

## 2019-03-22 LAB — PHOSPHORUS: Phosphorus: 3.4 mg/dL (ref 2.5–4.6)

## 2019-03-22 LAB — TRIGLYCERIDES: Triglycerides: 176 mg/dL — ABNORMAL HIGH (ref <150)

## 2019-03-22 MED ORDER — ALBUMIN HUMAN 25 % IV SOLN
INTRAVENOUS | Status: AC
  Administered 2019-03-22: 12.5 g
  Filled 2019-03-22: qty 50

## 2019-03-22 MED ORDER — ALBUMIN HUMAN 5 % IV SOLN
25.0000 g | Freq: Once | INTRAVENOUS | Status: AC
  Administered 2019-03-22: 12.5 g via INTRAVENOUS
  Filled 2019-03-22: qty 250

## 2019-03-22 MED ORDER — POTASSIUM CHLORIDE 20 MEQ/15ML (10%) PO SOLN
30.0000 meq | ORAL | Status: AC
  Administered 2019-03-22 (×2): 30 meq
  Filled 2019-03-22 (×2): qty 30

## 2019-03-22 MED ORDER — MIDAZOLAM 50MG/50ML (1MG/ML) PREMIX INFUSION
0.5000 mg/h | INTRAVENOUS | Status: DC
  Administered 2019-03-22: 0.5 mg/h via INTRAVENOUS
  Administered 2019-03-23: 02:00:00 1 mg/h via INTRAVENOUS
  Filled 2019-03-22 (×3): qty 50

## 2019-03-22 MED ORDER — CALCIUM GLUCONATE-NACL 1-0.675 GM/50ML-% IV SOLN
1.0000 g | Freq: Once | INTRAVENOUS | Status: AC
  Administered 2019-03-22: 06:00:00 1000 mg via INTRAVENOUS
  Filled 2019-03-22: qty 50

## 2019-03-22 MED ORDER — MIDAZOLAM HCL 2 MG/2ML IJ SOLN: 1.0000 mg | INTRAMUSCULAR | Status: DC | PRN

## 2019-03-22 NOTE — Procedures (Addendum)
Tunneled pleural catheter placement with imaging guidance (CPT 32550)  Indication: Malignant right effusion  Consent:Obtained from Son  Surgeon: [name] MD, Erskine Emery MD  Anesthesia: Local  Procedure - Timeout performed - Site of effusion marked with [place] - Area cleaned, prepped, and draped - 10cc 1% subcutaneous lidocaine injected to anesthetize area - 1.5 cm incision made overlying fluid and another about 5 cm anterior to this along chest wall - PleurX catheter inserted in usual sterile fashion using modified seldinger technique - Cathter hooked to suction - After fluid aspirated, pleurX capped and sterile dressing applied  Findings - 250 cc's of  serosanguineous fluid obtained - CXR pending   Complications: none immediate  Beverly Thain MD [facility] Please see [email or web address] for pager details.  03/22/2019, 1:29 PM

## 2019-03-22 NOTE — Progress Notes (Signed)
MEDICATION-RELATED CONSULT NOTE   IR Procedure Consult - Anticoagulant/Antiplatelet PTA/Inpatient Med List Review by Pharmacist    Procedure: Tunneled pleural catheter placement    Completed: 03/22/19 @ 13:39  Post-Procedural bleeding risk per IR MD assessment:  LOW  Antithrombotic medications on inpatient or PTA profile prior to procedure:   Heparin 5000 units sq q8h    Recommended restart time per IR Post-Procedure Guidelines:   Day 0  (at least 4 hours or at next standard dose interval)     Plan:      Continue heparin 5000 units sq q8h with next dose due @ 22:00  Beverly Tucker, PharmD

## 2019-03-22 NOTE — Progress Notes (Signed)
[facility] Progress Note Patient Name: Beverly Tucker DOB: [birth year] MRN: [id number]   Date of Service  03/22/2019  HPI/Events of Note  K+ 3.0, Calcium 7.1  eICU Interventions  KCL 30 meq via NG tube Q 4 hours x 2, Calcium gluconate 1 gm iv x  1        Cas Tracz U Litsy Epting 03/22/2019, 4:53 AM

## 2019-03-22 NOTE — Progress Notes (Addendum)
[facility] Progress Note Patient Name: Beverly Tucker DOB: 1940/02/23 MRN: [id number]   Date of Service  03/22/2019  HPI/Events of Note  Hemodynamic instability and bradycardia on Propofol infusion.  eICU Interventions  Propofol discontinued, Versed infusion substituted, Albumin 5 % 500 ml iv x 1 since patient had large volume thoracentesis ( > 1 liter of aspirate) within the past couple of days without Albumin replacement. Lasix discontinued, a.m. Echo to assess LV function and r/o new pulmonary hypertension.        Chasey Dull U Braidan Ricciardi 03/22/2019, 1:15 AM

## 2019-03-22 NOTE — Progress Notes (Signed)
[facility] Progress Note Patient Name: Beverly Tucker DOB: 06/03/1940 MRN: [id number]   Date of Service  03/22/2019  HPI/Events of Note  Sub-optimal sedation on Propofol and Fentanyl, any attempt to increase Propofol infusion rate results in significant bradycardia.  eICU Interventions  Versed 1-2 mg iv Q 2 hours PRN agitation.        Kerry Kass Jomarion Mish 03/22/2019, 12:45 AM

## 2019-03-22 NOTE — Progress Notes (Signed)
RN accessed pleur-x cath on left side 250cc drained of yellow and then blood/clots.

## 2019-03-22 NOTE — Progress Notes (Addendum)
NAME:  Beverly Tucker, MRN:  [id number], DOB:  October 19, 1940, LOS: 3 ADMISSION DATE:  03/04/2019, CONSULTATION DATE:  03/04/2019 REFERRING MD:  EDP, CHIEF COMPLAINT: Acute respiratory  Brief History   79 year old with breast cancer, chronic diastolic heart failure, A. fib, CKD stage III, malignant pleural effusion presenting with hypercarbic respiratory failure, dyspnea.  Has bilateral effusions with Pleurx on the left placed by IR on 11/08/2018 with weekly drainage. Per family she gets about 400 cc of drainage each time with decreasing output over the last few weeks. Last drainage was 1 week ago. Today Pleurx drainage was attempted in the ED with minimal output (10-15cc).  She was intubated in the ED for altered mental status and PCCM called for admission  Past Medical History    has a past medical history of Anemia, Cancer ([place]), Chronic diastolic CHF (congestive heart failure) ([place]), CKD (chronic kidney disease), stage III, First degree AV block, GERD (gastroesophageal reflux disease), Hypertension, Malignant pleural effusion, Metastatic breast cancer ([place]), PAF (paroxysmal atrial fibrillation) ([place]), PSVT (paroxysmal supraventricular tachycardia) ([place]), and Tachy-brady syndrome ([place]).   Breast cancer diagnosed in September 2020.  Currently on letrozole therapy  Significant Hospital Events   2/17- Admit 2/18- Thoracentesis 2/19- TPA in left pleurex with removal of 150cc fluid 2/20- On norepi for hypotension  Consults:  PCCM  Procedures:  ETT 03/08/2019  Thoracentesis-  2/18  Significant Diagnostic Tests:  CT chest 03/27/2019-no pulmonary embolism, increase in size of bilateral pleural effusions with right middle lobe airspace density.  I have reviewed the images personally.  Pleural fluid 2/18-LDH 114, total protein 4.  Cytology, micro pending  Micro Data:  Blood 2/17- 1/2 coag neg staph Sputum 2/20-no growth Urine 2/17-no growth Pleural culture 2/18-no growth  Antimicrobials:   Ceftriaxone 2/18 >> Azithromycin 2/18 >> Vanco 2/19 >>  Interim history/subjective:  Remains on the ventilator.  Started Levophed yesterday for hypotension.  Objective   Blood pressure (!) 112/36, pulse (!) 51, temperature 100.2 F (37.9 C), resp. rate 16, height 5\' 5"  (1.651 m), weight 75.4 kg, SpO2 97 %.    Vent Mode: PRVC FiO2 (%):  [30 %] 30 % Set Rate:  [16 bmp] 16 bmp Vt Set:  [380 mL] 380 mL PEEP:  [5 cmH20] 5 cmH20 Plateau Pressure:  [17 cmH20-22 cmH20] 20 cmH20   Intake/Output Summary (Last 24 hours) at 03/22/2019 0755 Last data filed at 03/22/2019 [id number] Gross per 24 hour  Intake 1560.93 ml  Output 3098 ml  Net -1537.07 ml   Filed Weights   03/21/19 0428 03/21/19 1156 03/22/19 0500  Weight: 76.8 kg 76.8 kg 75.4 kg    Examination: Gen:      No acute distress HEENT:  EOMI, sclera anicteric Neck:     No masses; no thyromegaly, ETT Lungs:    Clear to auscultation bilaterally; normal respiratory effort CV:         Regular rate and rhythm; no murmurs Abd:      + bowel sounds; soft, non-tender; no palpable masses, no distension Ext:    No edema; adequate peripheral perfusion Skin:      Warm and dry; no rash Neuro: alert and oriented x 3 Psych: normal mood and affect  Resolved Hospital Problem list     Assessment & Plan:  Acute hypercarbic respiratory failure  Secondary to bilateral malignant pleural effusions, CHF May have a component of underlying pneumonia.   Right pleural fluid now showing adenocarcinoma.  Will discuss with son about placing a Pleurx on  that side Continue intermittent drainage from left Pleurx.  S/p TPA to unclog on 2/19 Ceftriaxone, azithromycin for antibiotic coverage Repeat blood cultures.  We will stop Vanco if negative.  Staph is likely a contaminant  Paroxysmal atrial fibrillation, acute on chronic diastolic heart failure Stop Lasix as she is hemodynamically unstable Echocardiogram pending Continue amiodarone Holding Eliquis as she  is in sinus rhythm  AKI on CKD 3 Monitor urine output and creatinine  Metastatic breast cancer Continue letrozole  Best practice:  Diet: Tube feeds Pain/Anxiety/Delirium protocol (if indicated): Fentanyl, propofol VAP protocol (if indicated): Ordered DVT prophylaxis: Heparin subcu GI prophylaxis: Pepcid Glucose control: Monitor Mobility: Bed Code Status: Full Family Communication: Son updated daily. Disposition: ICU  Critical care time:    The patient is critically ill with multiple organ system failure and requires high complexity decision making for assessment and support, frequent evaluation and titration of therapies, advanced monitoring, review of radiographic studies and interpretation of complex data.   Critical Care Time devoted to patient care services, exclusive of separately billable procedures, described in this note is 35 minutes.   [name] MD [facility] Please see [email or web address] for pager details.  03/22/2019, 7:59 AM

## 2019-03-23 ENCOUNTER — Inpatient Hospital Stay (HOSPITAL_COMMUNITY): Payer: Medicare HMO

## 2019-03-23 LAB — BODY FLUID CULTURE: Culture: NO GROWTH

## 2019-03-23 LAB — BASIC METABOLIC PANEL
Anion gap: 9 (ref 5–15)
BUN: 25 mg/dL — ABNORMAL HIGH (ref 8–23)
CO2: 30 mmol/L (ref 22–32)
Calcium: 7.2 mg/dL — ABNORMAL LOW (ref 8.9–10.3)
Chloride: 104 mmol/L (ref 98–111)
Creatinine, Ser: 0.96 mg/dL (ref 0.44–1.00)
GFR calc Af Amer: >60 mL/min (ref >60)
GFR calc non Af Amer: 56 mL/min — ABNORMAL LOW (ref >60)
Glucose, Bld: 93 mg/dL (ref 70–99)
Potassium: 3.5 mmol/L (ref 3.5–5.1)
Sodium: 143 mmol/L (ref 135–145)

## 2019-03-23 LAB — CBC
HCT: 27.2 % — ABNORMAL LOW (ref 36.0–46.0)
Hemoglobin: 7.9 g/dL — ABNORMAL LOW (ref 12.0–15.0)
MCH: 26.2 pg (ref 26.0–34.0)
MCHC: 29 g/dL — ABNORMAL LOW (ref 30.0–36.0)
MCV: 90.4 fL (ref 80.0–100.0)
Platelets: 330 10*3/uL (ref 150–400)
RBC: 3.01 MIL/uL — ABNORMAL LOW (ref 3.87–5.11)
RDW: 15.8 % — ABNORMAL HIGH (ref 11.5–15.5)
WBC: 6.8 10*3/uL (ref 4.0–10.5)
nRBC: 0 % (ref 0.0–0.2)

## 2019-03-23 LAB — CULTURE, BLOOD (ROUTINE X 2): Special Requests: ADEQUATE

## 2019-03-23 LAB — MAGNESIUM: Magnesium: 2 mg/dL (ref 1.7–2.4)

## 2019-03-23 LAB — PHOSPHORUS: Phosphorus: 2.9 mg/dL (ref 2.5–4.6)

## 2019-03-23 MED ORDER — ATROPINE SULFATE 1 MG/10ML IJ SOSY
PREFILLED_SYRINGE | INTRAMUSCULAR | Status: AC
  Filled 2019-03-23: qty 10

## 2019-03-23 MED ORDER — MIDAZOLAM HCL 2 MG/2ML IJ SOLN
1.0000 mg | INTRAMUSCULAR | Status: DC | PRN
  Administered 2019-03-23 – 2019-03-26 (×6): 1 mg via INTRAVENOUS
  Filled 2019-03-23 (×7): qty 2

## 2019-03-23 MED ORDER — SODIUM CHLORIDE 0.9 % IV SOLN: INTRAVENOUS | Status: DC | PRN

## 2019-03-23 MED ORDER — DEXMEDETOMIDINE HCL IN NACL 200 MCG/50ML IV SOLN
0.4000 ug/kg/h | INTRAVENOUS | Status: DC
  Administered 2019-03-23 – 2019-03-24 (×2): 0.4 ug/kg/h via INTRAVENOUS
  Administered 2019-03-24 (×2): 0.5 ug/kg/h via INTRAVENOUS
  Administered 2019-03-25: 0.4 ug/kg/h via INTRAVENOUS
  Administered 2019-03-25 (×3): 0.8 ug/kg/h via INTRAVENOUS
  Administered 2019-03-25: 12:00:00 1 ug/kg/h via INTRAVENOUS
  Administered 2019-03-25: 0.7 ug/kg/h via INTRAVENOUS
  Administered 2019-03-26 (×2): 0.8 ug/kg/h via INTRAVENOUS
  Filled 2019-03-23 (×2): qty 50
  Filled 2019-03-23: qty 100
  Filled 2019-03-23: qty 50
  Filled 2019-03-23: qty 100
  Filled 2019-03-23 (×7): qty 50

## 2019-03-23 MED ORDER — MIDAZOLAM HCL 2 MG/2ML IJ SOLN
1.0000 mg | INTRAMUSCULAR | Status: AC | PRN
  Administered 2019-03-23 (×3): 1 mg via INTRAVENOUS
  Filled 2019-03-23 (×3): qty 2

## 2019-03-23 MED ORDER — FENTANYL CITRATE (PF) 100 MCG/2ML IJ SOLN
25.0000 ug | INTRAMUSCULAR | Status: DC | PRN
  Administered 2019-03-24: 50 ug via INTRAVENOUS
  Administered 2019-03-24: 75 ug via INTRAVENOUS
  Administered 2019-03-25: 100 ug via INTRAVENOUS
  Administered 2019-03-26: 25 ug via INTRAVENOUS
  Administered 2019-03-26: 100 ug via INTRAVENOUS
  Filled 2019-03-23 (×6): qty 2

## 2019-03-23 MED ORDER — FENTANYL CITRATE (PF) 100 MCG/2ML IJ SOLN
25.0000 ug | INTRAMUSCULAR | Status: AC | PRN
  Administered 2019-03-24 (×3): 25 ug via INTRAVENOUS
  Filled 2019-03-23 (×3): qty 2

## 2019-03-23 NOTE — Progress Notes (Signed)
RN Helina Hullum wasted 200cc of Fentanyl with RN Eli Phillips.

## 2019-03-23 NOTE — Progress Notes (Signed)
Pt agitated with a RASS score of 2 at shift change. PRN Versed given x2. Effective for only a short time. RN attempted to restarted Precedex infusion, but had to stop this as patient's HR dropped to the low 40s. RN will continue to monitor this patient's HR and sedation needs throughout night.

## 2019-03-23 NOTE — Progress Notes (Addendum)
Pt. HR remains low at 41 despite turning off all sedation. PT. BP was inaccurate throughout the day. RT put in a L arterial line. Pt. Diastolic remains low goal is to keep systolic [id number] or above. Pt. Remained febrile throughout the day at 100 despite tylenol and low room temperature. RN will continue to monitor closely.  Levo off Fentanyl off Precedex off  Pt. Responds to pain.

## 2019-03-23 NOTE — Progress Notes (Addendum)
NAME:  Beverly Tucker, MRN:  [id number], DOB:  Feb 25, 1940, LOS: 4 ADMISSION DATE:  03/23/2019, CONSULTATION DATE:  03/27/2019 REFERRING MD:  EDP, CHIEF COMPLAINT: Acute respiratory  Brief History   79 year old with breast cancer, chronic diastolic heart failure, A. fib, CKD stage III, malignant pleural effusion presenting with hypercarbic respiratory failure, dyspnea.  Has bilateral effusions with Pleurx on the left placed by IR on 11/08/2018 with weekly drainage. Per family she gets about 400 cc of drainage each time with decreasing output over the last few weeks. Last drainage was 1 week ago. Today Pleurx drainage was attempted in the ED with minimal output (10-15cc).  She was intubated in the ED for altered mental status and PCCM called for admission  Past Medical History    has a past medical history of Anemia, Cancer ([place]), Chronic diastolic CHF (congestive heart failure) ([place]), CKD (chronic kidney disease), stage III, First degree AV block, GERD (gastroesophageal reflux disease), Hypertension, Malignant pleural effusion, Metastatic breast cancer ([place]), PAF (paroxysmal atrial fibrillation) ([place]), PSVT (paroxysmal supraventricular tachycardia) ([place]), and Tachy-brady syndrome ([place]).   Breast cancer diagnosed in September 2020.  Currently on letrozole therapy  Significant Hospital Events   2/17- Admit 2/18- Thoracentesis 2/19- TPA in left pleurex with removal of 150cc fluid 2/20- On norepi for hypotension, Right pleurex placed  Consults:  PCCM  Procedures:  ETT 2/17 >> Lt IJ CVL 2/17 >>  Thoracentesis-  2/18 Rt pleureX- 2/20  Significant Diagnostic Tests:  CT chest 03/28/2019-no pulmonary embolism, increase in size of bilateral pleural effusions with right middle lobe airspace density.  I have reviewed the images personally.  Pleural fluid 2/18-LDH 114, total protein 4.  Cytology-adenocarcinoma  Echo 2/20-LVEF [id number], grade 1 diastolic dysfunction, mild RV enlargement  Micro Data:   Blood 2/17- 1/2 coag neg staph Sputum 2/20-no growth Urine 2/17-no growth Pleural culture 2/18-no growth  Urine strep pneumo, Legionella 2/17-negative  Antimicrobials:  Ceftriaxone 2/18 >> Azithromycin 2/18 >> Vanco 2/19 >>  Interim history/subjective:  Remains on the ventilator on minimal settings, continues on low-dose Levophed.  No acute events overnight  Objective   Blood pressure (!) 102/34, pulse (!) 45, temperature 99.9 F (37.7 C), resp. rate 16, height 5\' 5"  (1.651 m), weight 75 kg, SpO2 99 %. CVP:  [12 mmHg] 12 mmHg  Vent Mode: PRVC FiO2 (%):  [30 %] 30 % Set Rate:  [16 bmp] 16 bmp Vt Set:  [380 mL] 380 mL PEEP:  [5 cmH20] 5 cmH20 Plateau Pressure:  [18 cmH20-20 cmH20] 19 cmH20   Intake/Output Summary (Last 24 hours) at 03/23/2019 0759 Last data filed at 03/23/2019 0600 Gross per 24 hour  Intake 949.82 ml  Output 700 ml  Net 249.82 ml   Filed Weights   03/21/19 1156 03/22/19 0500 03/23/19 0456  Weight: 76.8 kg 75.4 kg 75 kg    Examination: Gen:      No acute distress chronically ill appearing HEENT:  EOMI, sclera anicteric Neck:     No masses; no thyromegaly, ETT Lungs:    Clear to auscultation bilaterally; normal respiratory effort CV:         Regular rate and rhythm; no murmurs Abd:      + bowel sounds; soft, non-tender; no palpable masses, no distension Ext:    No edema; adequate peripheral perfusion Skin:      Warm and dry; no rash Neuro: Sedated, unresponsive  Labs reviewed with stable values Chest x-ray today with bilateral chest tubes and ET tube in good  position  Resolved Hospital Problem list     Assessment & Plan:  Acute hypercarbic respiratory failure  Secondary to bilateral malignant pleural effusions, CHF May have a component of underlying pneumonia.  Continue intermittent drainage from left Pleurx.  S/p TPA to unclog on 2/19 Now with right Pleurx.  Continue daily drainage Ceftriaxone, azithromycin for antibiotic coverage Repeat  blood cultures.  We will stop Vanco if negative.  Staph is likely a contaminant  Paroxysmal atrial fibrillation, acute on chronic diastolic heart failure Off Lasix as she is hypotensive on pressors Continue amiodarone Holding Eliquis as she is in sinus rhythm  AKI on CKD 3 Monitor urine output and creatinine  Metastatic breast cancer Continue letrozole  Best practice:  Diet: Tube feeds Pain/Anxiety/Delirium protocol (if indicated): Fentanyl, versed. Add precedex and wean other sedation VAP protocol (if indicated): Ordered DVT prophylaxis: Heparin subcu GI prophylaxis: Pepcid Glucose control: Monitor Mobility: Bed Code Status: Full Family Communication: Son last updated 2/21 Disposition: ICU  Critical care time:    The patient is critically ill with multiple organ system failure and requires high complexity decision making for assessment and support, frequent evaluation and titration of therapies, advanced monitoring, review of radiographic studies and interpretation of complex data.   Critical Care Time devoted to patient care services, exclusive of separately billable procedures, described in this note is 35 minutes.   [name] MD [facility] Please see [email or web address] for pager details.  03/23/2019, 8:07 AM

## 2019-03-23 NOTE — Progress Notes (Signed)
PT. Pleur-x drained L side scant pus drainage (unmeasurable) R side 77ml yellow/pinkish fluid drained. Pt. Tolerated well. RN will continue to monitor closely.

## 2019-03-23 NOTE — Progress Notes (Signed)
Pharmacy Antibiotic Note  Beverly Tucker is a 79 y.o. female with hx breast cancer and malignant pleural effusion with Pleurx who presented to [facility] on 03/03/2019 for evaluation of decreased Pleurx drainage. She was intubated in the ED and started on azithromycin and ceftriaxone for PNA. One bottle from one set of bcx from 2/17 had GPC in clusters.  Pharmacy was consulted on 2/18 to add vancomycin to abx regimen for suspected bacteremia.  Today, 03/23/2019: Tmax 100.6, WBC decr wnl, SCr decr 0.96 Repeat BCx ordered  Plan: D4 full abx, cont febrile, WBC decr CTX 2gm q24 & Azithromycin 500mg  IV q24 for CAP - D4 Vanc 1.5gm x1, then 750mg  q24, AUC 409  -r/o GPC bacteremia, rpt BCx 2/21  Height: 5\' 5"  (165.1 cm) Weight: 165 lb 5.5 oz (75 kg) IBW/kg (Calculated) : 57  Temp (24hrs), Avg:100.1 F (37.8 C), Min:99.7 F (37.6 C), Max:100.6 F (38.1 C)  Recent Labs  Lab 03/25/2019 1257 03/10/2019 2030 03/20/19 0040 03/20/19 0237 03/20/19 0541 03/21/19 0451 03/22/19 0233 03/23/19 0450  WBC 17.0*  --   --   --  12.4* 8.2 11.0* 6.8  CREATININE 1.01*  --   --   --  1.17* 1.22* 1.17* 0.96  LATICACIDVEN  --  2.0* 1.7 1.9 1.7  --   --   --     Estimated Creatinine Clearance: 48.2 mL/min (by C-G formula based on SCr of 0.96 mg/dL).    No Known Allergies   Antimicrobials this admission:  2/17 CTX> 2/17 Azith>> 2/18 vanc>>  Microbiology results:  2/17 BCx2: 1 bottle from 1 set with CoNS (BCID not done)--> Started Vanc per Dr. Vaughan Browner 2/17 Trach asp:  normal flora-final 2/17 UCx: ng-final 2/17 Covid/flu neg 2/17 MRSA PCR: neg 2/17 strep pneumo: neg 2/18 pleural fluid: ng-final 2/21 BCx: sent  Thank you for allowing pharmacy to be a part of this patient's care.  Minda Ditto PharmD 03/23/2019 10:30 AM

## 2019-03-24 DIAGNOSIS — J9602 Acute respiratory failure with hypercapnia: Principal | ICD-10-CM

## 2019-03-24 LAB — CBC
HCT: 29.4 % — ABNORMAL LOW (ref 36.0–46.0)
Hemoglobin: 8.5 g/dL — ABNORMAL LOW (ref 12.0–15.0)
MCH: 26.2 pg (ref 26.0–34.0)
MCHC: 28.9 g/dL — ABNORMAL LOW (ref 30.0–36.0)
MCV: 90.5 fL (ref 80.0–100.0)
Platelets: 372 10*3/uL (ref 150–400)
RBC: 3.25 MIL/uL — ABNORMAL LOW (ref 3.87–5.11)
RDW: 15.4 % (ref 11.5–15.5)
WBC: 8.2 10*3/uL (ref 4.0–10.5)
nRBC: 0 % (ref 0.0–0.2)

## 2019-03-24 LAB — CULTURE, BLOOD (ROUTINE X 2): Culture: NO GROWTH

## 2019-03-24 LAB — PHOSPHORUS: Phosphorus: 2.1 mg/dL — ABNORMAL LOW (ref 2.5–4.6)

## 2019-03-24 LAB — BASIC METABOLIC PANEL
Anion gap: 10 (ref 5–15)
BUN: 24 mg/dL — ABNORMAL HIGH (ref 8–23)
CO2: 28 mmol/L (ref 22–32)
Calcium: 7.6 mg/dL — ABNORMAL LOW (ref 8.9–10.3)
Chloride: 107 mmol/L (ref 98–111)
Creatinine, Ser: 0.89 mg/dL (ref 0.44–1.00)
GFR calc Af Amer: >60 mL/min (ref >60)
GFR calc non Af Amer: >60 mL/min (ref >60)
Glucose, Bld: 101 mg/dL — ABNORMAL HIGH (ref 70–99)
Potassium: 3.4 mmol/L — ABNORMAL LOW (ref 3.5–5.1)
Sodium: 145 mmol/L (ref 135–145)

## 2019-03-24 LAB — MAGNESIUM: Magnesium: 2.3 mg/dL (ref 1.7–2.4)

## 2019-03-24 MED ORDER — VITAL HIGH PROTEIN PO LIQD
1000.0000 mL | ORAL | Status: DC
  Administered 2019-03-24 – 2019-03-25 (×2): 1000 mL

## 2019-03-24 MED ORDER — PRO-STAT SUGAR FREE PO LIQD
30.0000 mL | Freq: Two times a day (BID) | ORAL | Status: DC
  Administered 2019-03-24 – 2019-03-25 (×2): 30 mL
  Filled 2019-03-24 (×2): qty 30

## 2019-03-24 MED ORDER — POTASSIUM CHLORIDE 20 MEQ/15ML (10%) PO SOLN
40.0000 meq | Freq: Once | ORAL | Status: AC
  Administered 2019-03-24: 40 meq
  Filled 2019-03-24: qty 30

## 2019-03-24 NOTE — Progress Notes (Signed)
Pt Pleurex drain drained. R side 155cc pinkish yellow fluid and L side 15 cc of pink tinge fluid. Pt demonstrated no discomfort during process. Will continue to monitor

## 2019-03-24 NOTE — Progress Notes (Signed)
[facility] Progress Note Patient Name: REMMY JENNESS DOB: 1940-04-14 MRN: [id number]   Date of Service  03/24/2019  HPI/Events of Note  Notified of relative bradycardia on Precedex. BP remains adequate.  eICU Interventions   Ok to continue Precedex as long as BP permits but would titrate down as tolerated  Am labs ordered as requested     Intervention Category Intermediate Interventions: Arrhythmia - evaluation and management Minor Interventions: Clinical assessment - ordering diagnostic tests  Judd Lien 03/24/2019, 4:26 AM

## 2019-03-24 NOTE — Progress Notes (Signed)
Pt's HR dropped into high 40s after Precedex gtt restarted. [place] MD advised to continue the drip as long as her BP permitted despite the bradycardia.

## 2019-03-24 NOTE — Progress Notes (Addendum)
NAME:  Beverly Tucker, MRN:  [id number], DOB:  1940/02/06, LOS: 5 ADMISSION DATE:  03/14/2019, CONSULTATION DATE:  03/18/2019 REFERRING MD:  EDP, CHIEF COMPLAINT: Acute respiratory  Brief History   79 year old with breast cancer, chronic diastolic heart failure, A. fib, CKD stage III, malignant pleural effusion presenting with hypercarbic respiratory failure, dyspnea.  Has bilateral effusions with Pleurx on the left placed by IR on 11/08/2018 with weekly drainage. Per family she gets about 400 cc of drainage each time with decreasing output over the last few weeks. Last drainage was 1 week ago. Today Pleurx drainage was attempted in the ED with minimal output (10-15cc).  She was intubated in the ED for altered mental status and PCCM called for admission  Past Medical History    has a past medical history of Anemia, Cancer ([place]), Chronic diastolic CHF (congestive heart failure) ([place]), CKD (chronic kidney disease), stage III, First degree AV block, GERD (gastroesophageal reflux disease), Hypertension, Malignant pleural effusion, Metastatic breast cancer ([place]), PAF (paroxysmal atrial fibrillation) ([place]), PSVT (paroxysmal supraventricular tachycardia) ([place]), and Tachy-brady syndrome ([place]).   Breast cancer diagnosed in September 2020.  Currently on letrozole therapy  Significant Hospital Events   2/17- Admit 2/18- Thoracentesis 2/19- TPA in left pleurex with removal of 150cc fluid 2/20- On norepi for hypotension, Right pleurex placed  Consults:  PCCM  Procedures:  ETT 2/17 >> Lt IJ CVL 2/17 >> Right Side Thoracentesis-  2/18 > 1000 ml of pink tinged fluid obtained  Rt pleureX- 2/20  Significant Diagnostic Tests:  CT chest 03/09/2019-no pulmonary embolism, increase in size of bilateral pleural effusions with right middle lobe airspace density.  I have reviewed the images personally. Pleural fluid 2/18-LDH 114, total protein. Cytology-adenocarcinoma Echo 2/20-LVEF 56-31%, grade 1 diastolic  dysfunction, mild RV enlargement  Micro Data:  Blood 2/17- 1/2 coag neg staph Sputum 2/20-no growth Urine 2/17-no growth Pleural culture 2/18-no growth Urine strep pneumo, Legionella 2/17-negative Blood 2/21 >>>   Antimicrobials:  Ceftriaxone 2/18 >> Azithromycin 2/18 >> Vanco 2/19 >>  Interim history/subjective:  Remains intubated on minimal settings. Attempted SBT this AM however with significant tachypnea   Scheduled Meds: . amiodarone  200 mg Per NG tube Daily  . chlorhexidine gluconate (MEDLINE KIT)  15 mL Mouth Rinse BID  . Chlorhexidine Gluconate Cloth  6 each Topical Daily  . famotidine  20 mg Per Tube Daily  . heparin  5,000 Units Subcutaneous Q8H  . letrozole  2.5 mg Oral Daily  . mouth rinse  15 mL Mouth Rinse 10 times per day  . potassium chloride  40 mEq Per Tube Once  . sodium chloride flush  10-40 mL Intracatheter Q12H   Continuous Infusions: . azithromycin Stopped (03/23/19 1107)  . cefTRIAXone (ROCEPHIN)  IV Stopped (03/23/19 1231)  . dexmedetomidine (PRECEDEX) IV infusion 0.4 mcg/kg/hr (03/24/19 0130)  . norepinephrine (LEVOPHED) Adult infusion Stopped (03/24/19 0430)  . vancomycin Stopped (03/23/19 1545)   PRN Meds:.acetaminophen (TYLENOL) oral liquid 160 mg/5 mL, fentaNYL (SUBLIMAZE) injection, midazolam, sodium chloride flush   Objective   Blood pressure (!) 173/51, pulse (!) 51, temperature (!) 100.4 F (38 C), resp. rate (!) 22, height '5\' 5"'  (1.651 m), weight 75.1 kg, SpO2 96 %.    Vent Mode: CPAP;PSV FiO2 (%):  [30 %] 30 % Set Rate:  [16 bmp] 16 bmp Vt Set:  [380 mL] 380 mL PEEP:  [5 cmH20] 5 cmH20 Pressure Support:  [12 cmH20] 12 cmH20 Plateau Pressure:  [18 cmH20-20 cmH20] 19 cmH20  Intake/Output Summary (Last 24 hours) at 03/24/2019 0825 Last data filed at 03/24/2019 0000 Gross per 24 hour  Intake 678.81 ml  Output 780 ml  Net -101.19 ml   Filed Weights   03/22/19 0500 03/23/19 0456 03/24/19 0500  Weight: 75.4 kg 75 kg 75.1 kg     Examination: Gen:      Elderly female on vent  HEENT: ETT/OG in place  Lungs:    Clear breath sounds, no wheeze/crackles  CV:         Brady, no MRG Abd:      Active bowel sounds, soft, non-distended  Ext:    -edema  Skin:      Warm and dry; noted black lesion with surrounding redness under right breast  Neuro:  Alert, follows commands  Labs and Imaging reviewed.   Resolved Hospital Problem list     Assessment & Plan:   Acute hypercarbic respiratory failure secondary to bilateral malignant pleural effusions, CHF, suspect component of CAP (CT Chest with right middle airspace density)   Plan  -Vent Support >> This AM with significant tachypnea with weaning  -Trend ABG/CXR  -Continue intermittent drainage from left (s/p TPA on 2/19) and right Pleurx  -Ceftriaxone, azithromycin for antibiotic coverage -Follow Culture Data, will D/C Vanco if negative > suspect Staph is contaminant  Paroxysmal atrial fibrillation  Plan  -Cardiac Monitoring  -Has maintained off Eliquis as currently in sinus rhythm -Continue amiodarone    Acute on chronic diastolic heart failure -ECHO on 2/20 with EF 70-75, G1DD Plan  -Continue amiodarone -Holding Eliquis as she is in sinus rhythm  Acute on Chronic Kidney Disease Stage 3 >>> Resolved  Hypokalemia  Plan  -Trend BMP -Replacing K now   Metastatic breast cancer Plan  -Continue letrozole  Best practice:  Diet: Tube feeds Pain/Anxiety/Delirium protocol (if indicated): On Fentanyl/Precedex VAP protocol (if indicated) DVT prophylaxis: Heparin SQ GI prophylaxis: Pepcid Glucose control: Monitor Mobility: Bed Code Status: Full Family Communication: Will update family on plan of care  Disposition: ICU  Critical care time:    The patient is critically ill with multiple organ system failure and requires high complexity decision making for assessment and support, frequent evaluation and titration of therapies, advanced monitoring, review of  radiographic studies and interpretation of complex data.   Critical Care Time devoted to patient care services, exclusive of separately billable procedures, described in this note is 32 minutes.   Hayden Pedro, AGACNP-BC [facility]  Pgr: [phone]  PCCM Pgr: [phone]

## 2019-03-25 ENCOUNTER — Inpatient Hospital Stay (HOSPITAL_COMMUNITY): Payer: Medicare HMO

## 2019-03-25 DIAGNOSIS — C799 Secondary malignant neoplasm of unspecified site: Secondary | ICD-10-CM

## 2019-03-25 DIAGNOSIS — L8990 Pressure ulcer of unspecified site, unspecified stage: Secondary | ICD-10-CM | POA: Insufficient documentation

## 2019-03-25 DIAGNOSIS — Z9911 Dependence on respirator [ventilator] status: Secondary | ICD-10-CM

## 2019-03-25 LAB — CBC
HCT: 28.8 % — ABNORMAL LOW (ref 36.0–46.0)
Hemoglobin: 8.2 g/dL — ABNORMAL LOW (ref 12.0–15.0)
MCH: 25.9 pg — ABNORMAL LOW (ref 26.0–34.0)
MCHC: 28.5 g/dL — ABNORMAL LOW (ref 30.0–36.0)
MCV: 91.1 fL (ref 80.0–100.0)
Platelets: 384 10*3/uL (ref 150–400)
RBC: 3.16 MIL/uL — ABNORMAL LOW (ref 3.87–5.11)
RDW: 15.4 % (ref 11.5–15.5)
WBC: 7.4 10*3/uL (ref 4.0–10.5)
nRBC: 0 % (ref 0.0–0.2)

## 2019-03-25 LAB — BASIC METABOLIC PANEL
Anion gap: 8 (ref 5–15)
BUN: 32 mg/dL — ABNORMAL HIGH (ref 8–23)
CO2: 28 mmol/L (ref 22–32)
Calcium: 8.1 mg/dL — ABNORMAL LOW (ref 8.9–10.3)
Chloride: 109 mmol/L (ref 98–111)
Creatinine, Ser: 0.96 mg/dL (ref 0.44–1.00)
GFR calc Af Amer: >60 mL/min (ref >60)
GFR calc non Af Amer: 56 mL/min — ABNORMAL LOW (ref >60)
Glucose, Bld: 142 mg/dL — ABNORMAL HIGH (ref 70–99)
Potassium: 3.6 mmol/L (ref 3.5–5.1)
Sodium: 145 mmol/L (ref 135–145)

## 2019-03-25 LAB — MAGNESIUM
Magnesium: 2.4 mg/dL (ref 1.7–2.4)
Magnesium: 2.3 mg/dL (ref 1.7–2.4)

## 2019-03-25 LAB — PHOSPHORUS
Phosphorus: 2 mg/dL — ABNORMAL LOW (ref 2.5–4.6)
Phosphorus: 4 mg/dL (ref 2.5–4.6)

## 2019-03-25 MED ORDER — VITAL AF 1.2 CAL PO LIQD
1000.0000 mL | ORAL | Status: DC
  Administered 2019-03-25: 1000 mL

## 2019-03-25 MED ORDER — POTASSIUM PHOSPHATES 15 MMOLE/5ML IV SOLN
30.0000 mmol | Freq: Once | INTRAVENOUS | Status: AC
  Administered 2019-03-25: 30 mmol via INTRAVENOUS
  Filled 2019-03-25: qty 10

## 2019-03-25 NOTE — Progress Notes (Signed)
RN emptied pt. pleurx drains R drain removed 500cc and the L drain removed 10cc if the x-ray looks okay for her left lung. Left pleur-x can most likely be removed tomorrow.

## 2019-03-25 NOTE — Progress Notes (Signed)
Nutrition Follow-up   DOCUMENTATION CODES:   Not applicable  INTERVENTION:  - continue Vital High Protein @ 40 ml/hr with 30 ml prostat BID. - if patient is not able to be extubated by 1600, will adjust TF regimen to meet nutrition needs: Vital AF 1.2 @ 35 ml/hr to advance by 10 ml every 8 hours to reach goal rate of 55 ml/hr. - at goal rate, this regimen will provide 1584 kcal (96% estimated kcal need), 99 grams protein, and 1070 ml free water.  Monitor magnesium, potassium, and phosphorus daily for at least 3 days, MD to replete as needed, as pt is at risk for refeeding syndrome given current hypophosphatemia and no nutrition for >5 days prior to TF start.    NUTRITION DIAGNOSIS:   Inadequate oral intake related to inability to eat as evidenced by NPO status. -ongoing  GOAL:   Patient will meet greater than or equal to 90% of their needs -to be met with TF  MONITOR:   Vent status, TF tolerance, Labs, Weight trends  ASSESSMENT:   79 year old female with medical history of breast cancer, CHF, A. fib, stage 3 CKD, and malignant pleural effusion. She presented to the ED with hypercarbic respiratory failure and dyspnea. She has bilateral effusions with Pleurx on the left placed by IR on 11/08/2018 with weekly drainage. Per family, she gets about 400 cc of drainage each time with decreasing output over the last few weeks. Last drainage was 1 week ago. Today Pleurx drainage was attempted in the ED with minimal output (10-15cc).  She was intubated in the ED for AMS.  Patient remains intubated with OGT in place. TF was started yesterday per protocol: Vital High Protein @ 40 ml/hr with 60 ml prostat BID which provides 1160 kcal, 114 grams protein, and 802 ml free water.   RN reports no issues with TF. RN reports that she had patient Friday-Sunday and that TF was not started until yesterday d/t thought each day of possible extubation. RN reports that today is the most alert and interactive  patient has been and there is a high chance for extubation/extubation attempt later today.   Will continue current TF regimen at this time, but if unable to extubate by this evening, will adjust as outlined above. Weight +1.8 kg/4 lb from admission (2/17). Admission weight (76 kg) used to re-estimate kcal need.   Patient is currently intubated on ventilator support MV: 8.3 L/min Temp (24hrs), Avg:100.5 F (38.1 C), Min:100 F (37.8 C), Max:101.1 F (38.4 C) Propofol: none BP: 122/40 and MAP: 69    Labs reviewed; BUN: 32 mg/dl, Ca: 8.1 mg/dl, Phos: 2 mg/dl, GFR: 56 ml/min. Medications reviewed;  Drip; precedex @ 0.8 mcg/kg/hr.      NUTRITION - FOCUSED PHYSICAL EXAM:  completed; no muscle or fat wasting, mild edema to BLE.   Diet Order:   Diet Order            Diet NPO time specified  Diet effective now              EDUCATION NEEDS:   No education needs have been identified at this time  Skin:  Skin Assessment: Skin Integrity Issues: Skin Integrity Issues:: Stage I Stage I: sacrum (new documentation 2/22)  Last BM:  PTA/unknown  Height:   Ht Readings from Last 1 Encounters:  03/21/19 _0  (1.651 m)    Weight:   Wt Readings from Last 1 Encounters:  03/25/19 77.8 kg    Ideal Body Weight:  56.8 kg  BMI:  Body mass index is 28.54 kg/m.  Estimated Nutritional Needs:   Kcal:  1650 kcal  Protein:  91-114 grams  Fluid:  >/= 1.8 L/day     Jarome Matin, MS, RD, LDN, CNSC Inpatient Clinical Dietitian RD pager # available in [facility]  After hours/weekend pager # available in [facility]

## 2019-03-25 NOTE — Progress Notes (Signed)
NAME:  Beverly Tucker, MRN:  [id number], DOB:  February 04, 1940, LOS: 6 ADMISSION DATE:  03/14/2019, CONSULTATION DATE:  03/08/2019 REFERRING MD:  EDP, CHIEF COMPLAINT: Acute respiratory  Brief History   79 year old with breast cancer, chronic diastolic heart failure, A. fib, CKD stage III, malignant pleural effusion presenting with hypercarbic respiratory failure, dyspnea.  Has bilateral effusions with Pleurx on the left placed by IR on 11/08/2018 with weekly drainage. Per family she gets about 400 cc of drainage each time with decreasing output over the last few weeks. Last drainage was 1 week pta. Pleurx drainage was attempted in the ED 2/17 with minimal output (10-15cc).  She was intubated in the ED for altered mental status and PCCM called for admission  Past Medical History    has a past medical history of Anemia, Cancer ([place]), Chronic diastolic CHF (congestive heart failure) ([place]), CKD (chronic kidney disease), stage III, First degree AV block, GERD (gastroesophageal reflux disease), Hypertension, Malignant pleural effusion, Metastatic breast cancer ([place]), PAF (paroxysmal atrial fibrillation) ([place]), PSVT (paroxysmal supraventricular tachycardia) ([place]), and Tachy-brady syndrome ([place]).   Breast cancer diagnosed in September 2020.  Currently on letrozole therapy  Significant Hospital Events   2/17- Admit 2/18- Thoracentesis 2/19- TPA in left pleurex with removal of 150cc fluid 2/20- On norepi for hypotension, Right pleurex placed  Consults:  PCCM  Procedures:  ETT 2/17 >> Lt IJ CVL 2/17 >> Right Side Thoracentesis-  2/18 > 1000 ml of pink tinged fluid obtained  Rt pleureX- 2/20  Significant Diagnostic Tests:  CT chest 03/26/2019-no pulmonary embolism, increase in size of bilateral pleural effusions with right middle lobe airspace density.  I have reviewed the images personally. Pleural fluid 2/18-LDH 114, total protein. Cytology-adenocarcinoma Echo 2/20-LVEF 38-10%, grade 1 diastolic  dysfunction, mild RV enlargement  Micro Data:  Blood 2/17- 1/2 coag neg staph Sputum 2/20-no growth Urine 2/17-no growth Pleural culture 2/18-no growth Urine strep pneumo, Legionella 2/17-negative Blood 2/21 >>>   Antimicrobials:  Ceftriaxone 2/18 >> Azithromycin 2/18 >> Vanco 2/19 >>  Interim history/subjective:  Remains intubated on minimal settings. Attempted SBT this AM however with significant tachypnea   Scheduled Meds: . amiodarone  200 mg Per NG tube Daily  . chlorhexidine gluconate (MEDLINE KIT)  15 mL Mouth Rinse BID  . Chlorhexidine Gluconate Cloth  6 each Topical Daily  . famotidine  20 mg Per Tube Daily  . feeding supplement (PRO-STAT SUGAR FREE 64)  30 mL Per Tube BID  . feeding supplement (VITAL HIGH PROTEIN)  1,000 mL Per Tube Q24H  . heparin  5,000 Units Subcutaneous Q8H  . letrozole  2.5 mg Oral Daily  . mouth rinse  15 mL Mouth Rinse 10 times per day  . sodium chloride flush  10-40 mL Intracatheter Q12H   Continuous Infusions: . cefTRIAXone (ROCEPHIN)  IV Stopped (03/24/19 1200)  . dexmedetomidine (PRECEDEX) IV infusion 0.8 mcg/kg/hr (03/25/19 0600)  . norepinephrine (LEVOPHED) Adult infusion Stopped (03/24/19 0430)  . potassium PHOSPHATE IVPB (in mmol)    . vancomycin Stopped (03/24/19 1714)   PRN Meds:.acetaminophen (TYLENOL) oral liquid 160 mg/5 mL, fentaNYL (SUBLIMAZE) injection, midazolam, sodium chloride flush   Objective   Blood pressure (!) 120/41, pulse (!) 42, temperature (!) 100.6 F (38.1 C), resp. rate 16, height '5\' 5"'  (1.651 m), weight 77.8 kg, SpO2 100 %.    Vent Mode: PRVC FiO2 (%):  [30 %] 30 % Set Rate:  [16 bmp] 16 bmp Vt Set:  [380 mL] 380 mL PEEP:  [5  [place] Pressure:  [18 VXY80-16 cmH20] 18 cmH20   Intake/Output Summary (Last 24 hours) at 03/25/2019 0844 Last data filed at 03/25/2019 0600 Gross per 24 hour  Intake 526.71 ml  Output 600 ml  Net -73.29 ml   Filed Weights   03/23/19 0456 03/24/19 0500  03/25/19 0500  Weight: 75 kg 75.1 kg 77.8 kg    Examination: Gen:      Elderly female on vent, NAD  HEENT: ETT/OG in place  Lungs:    resps even non labored on vent, diminished bases otherwise clear, bilat pleurx CV:         Sinus Brady, no MRG Abd:      Active bowel sounds, soft, non-distended  Ext:    -edema  Skin:      Warm and dry; noted black lesion with surrounding redness under right breast  Neuro:  Alert, follows commands  Labs and Imaging reviewed.   Resolved Hospital Problem list     Assessment & Plan:   Acute hypercarbic respiratory failure secondary to bilateral malignant pleural effusions, CHF, suspect component of CAP (CT Chest with right middle airspace density)   Plan  -Vent Support  - daily SBT - has failed thus far r/t tachypnea  -Trend ABG/CXR  -Continue intermittent drainage from left (s/p TPA on 2/19) and right Pleurx  -Ceftriaxone, azithromycin for antibiotic coverage -Follow Culture Data, will D/C Vanco if negative > suspect Staph is contaminant  Paroxysmal atrial fibrillation  Plan  -Cardiac Monitoring  -continue off eliquis for now, has maintained NSR -Continue amiodarone    Acute on chronic diastolic heart failure -ECHO on 2/20 with EF 70-75, G1DD Plan  -Continue amiodarone -Holding Eliquis as above  Acute on Chronic Kidney Disease Stage 3 >>> Resolved  Hypokalemia  Plan  -Trend BMP - replete phos   Metastatic breast cancer Plan  -Continue letrozole  Best practice:  Diet: Tube feeds Pain/Anxiety/Delirium protocol (if indicated): On Fentanyl/Precedex VAP protocol (if indicated) DVT prophylaxis: Heparin SQ GI prophylaxis: Pepcid Glucose control: Monitor Mobility: Bed Code Status: Full Family Communication: Will update family on plan of care  Disposition: ICU  Critical care time:    The patient is critically ill with multiple organ system failure and requires high complexity decision making for assessment and support, frequent  evaluation and titration of therapies, advanced monitoring, review of radiographic studies and interpretation of complex data.   Critical Care Time devoted to patient care services, exclusive of separately billable procedures, described in this note is 31 minutes.    Nickolas Madrid, NP Pulmonary/Critical Care Medicine  03/25/2019  8:44 AM

## 2019-03-26 ENCOUNTER — Inpatient Hospital Stay (HOSPITAL_COMMUNITY): Payer: Medicare HMO

## 2019-03-26 DIAGNOSIS — J9601 Acute respiratory failure with hypoxia: Secondary | ICD-10-CM

## 2019-03-26 DIAGNOSIS — I4891 Unspecified atrial fibrillation: Secondary | ICD-10-CM

## 2019-03-26 LAB — CBC
HCT: 29.8 % — ABNORMAL LOW (ref 36.0–46.0)
HCT: 29.8 % — ABNORMAL LOW (ref 36.0–46.0)
Hemoglobin: 8.6 g/dL — ABNORMAL LOW (ref 12.0–15.0)
Hemoglobin: 8.6 g/dL — ABNORMAL LOW (ref 12.0–15.0)
MCH: 26.1 pg (ref 26.0–34.0)
MCH: 26.1 pg (ref 26.0–34.0)
MCHC: 28.9 g/dL — ABNORMAL LOW (ref 30.0–36.0)
MCHC: 28.9 g/dL — ABNORMAL LOW (ref 30.0–36.0)
MCV: 90.3 fL (ref 80.0–100.0)
MCV: 90.6 fL (ref 80.0–100.0)
Platelets: 394 10*3/uL (ref 150–400)
Platelets: 364 10*3/uL (ref 150–400)
RBC: 3.3 MIL/uL — ABNORMAL LOW (ref 3.87–5.11)
RBC: 3.29 MIL/uL — ABNORMAL LOW (ref 3.87–5.11)
RDW: 15.6 % — ABNORMAL HIGH (ref 11.5–15.5)
RDW: 15.4 % (ref 11.5–15.5)
WBC: 8.5 10*3/uL (ref 4.0–10.5)
WBC: 8 10*3/uL (ref 4.0–10.5)
nRBC: 0 % (ref 0.0–0.2)
nRBC: 0 % (ref 0.0–0.2)

## 2019-03-26 LAB — BLOOD GAS, ARTERIAL
Acid-base deficit: 2.4 mmol/L — ABNORMAL HIGH (ref 0.0–2.0)
Acid-base deficit: 0.4 mmol/L (ref 0.0–2.0)
Bicarbonate: 26.3 mmol/L (ref 20.0–28.0)
Bicarbonate: 26.9 mmol/L (ref 20.0–28.0)
FIO2: 50
O2 Saturation: 93.9 %
O2 Saturation: 96.9 %
Patient temperature: 37
Patient temperature: 98.6
pCO2 arterial: 71.3 mmHg (ref 32.0–48.0)
pCO2 arterial: 61.5 mmHg — ABNORMAL HIGH (ref 32.0–48.0)
pH, Arterial: 7.192 — CL (ref 7.350–7.450)
pH, Arterial: 7.263 — ABNORMAL LOW (ref 7.350–7.450)
pO2, Arterial: 88.7 mmHg (ref 83.0–108.0)
pO2, Arterial: 89.8 mmHg (ref 83.0–108.0)

## 2019-03-26 LAB — BASIC METABOLIC PANEL
Anion gap: 7 (ref 5–15)
Anion gap: 14 (ref 5–15)
BUN: 35 mg/dL — ABNORMAL HIGH (ref 8–23)
BUN: 30 mg/dL — ABNORMAL HIGH (ref 8–23)
CO2: 27 mmol/L (ref 22–32)
CO2: 25 mmol/L (ref 22–32)
Calcium: 8.3 mg/dL — ABNORMAL LOW (ref 8.9–10.3)
Calcium: 8.2 mg/dL — ABNORMAL LOW (ref 8.9–10.3)
Chloride: 111 mmol/L (ref 98–111)
Chloride: 108 mmol/L (ref 98–111)
Creatinine, Ser: 0.83 mg/dL (ref 0.44–1.00)
Creatinine, Ser: 1.07 mg/dL — ABNORMAL HIGH (ref 0.44–1.00)
GFR calc Af Amer: >60 mL/min (ref >60)
GFR calc Af Amer: 57 mL/min — ABNORMAL LOW (ref >60)
GFR calc non Af Amer: >60 mL/min (ref >60)
GFR calc non Af Amer: 49 mL/min — ABNORMAL LOW (ref >60)
Glucose, Bld: 158 mg/dL — ABNORMAL HIGH (ref 70–99)
Glucose, Bld: 173 mg/dL — ABNORMAL HIGH (ref 70–99)
Potassium: 3.4 mmol/L — ABNORMAL LOW (ref 3.5–5.1)
Potassium: 3.8 mmol/L (ref 3.5–5.1)
Sodium: 145 mmol/L (ref 135–145)
Sodium: 147 mmol/L — ABNORMAL HIGH (ref 135–145)

## 2019-03-26 LAB — MAGNESIUM: Magnesium: 2.2 mg/dL (ref 1.7–2.4)

## 2019-03-26 LAB — HEPARIN LEVEL (UNFRACTIONATED): Heparin Unfractionated: 0.73 IU/mL — ABNORMAL HIGH (ref 0.30–0.70)

## 2019-03-26 LAB — PHOSPHORUS: Phosphorus: 3.2 mg/dL (ref 2.5–4.6)

## 2019-03-26 MED ORDER — CHLORHEXIDINE GLUCONATE 0.12% ORAL RINSE (MEDLINE KIT): 15.0000 mL | Freq: Two times a day (BID) | OROMUCOSAL | Status: DC

## 2019-03-26 MED ORDER — FENTANYL 2500MCG IN NS 250ML (10MCG/ML) PREMIX INFUSION
0.0000 ug/h | INTRAVENOUS | Status: DC
  Administered 2019-03-26: 25 ug/h via INTRAVENOUS
  Administered 2019-03-27: 225 ug/h via INTRAVENOUS
  Administered 2019-03-27: 250 ug/h via INTRAVENOUS
  Administered 2019-03-27: 125 ug/h via INTRAVENOUS
  Administered 2019-03-28: 275 ug/h via INTRAVENOUS
  Administered 2019-03-28 – 2019-03-29 (×3): 300 ug/h via INTRAVENOUS
  Filled 2019-03-26 (×7): qty 250

## 2019-03-26 MED ORDER — SODIUM CHLORIDE 0.9 % IV SOLN: INTRAVENOUS | Status: DC

## 2019-03-26 MED ORDER — POTASSIUM PHOSPHATES 15 MMOLE/5ML IV SOLN
21.0000 mmol | Freq: Once | INTRAVENOUS | Status: AC
  Administered 2019-03-26: 21 mmol via INTRAVENOUS
  Filled 2019-03-26: qty 7

## 2019-03-26 MED ORDER — DEXMEDETOMIDINE HCL IN NACL 200 MCG/50ML IV SOLN
0.0000 ug/kg/h | INTRAVENOUS | Status: DC
  Administered 2019-03-26: 21:00:00 0.6 ug/kg/h via INTRAVENOUS
  Administered 2019-03-26: 0.7 ug/kg/h via INTRAVENOUS
  Administered 2019-03-26: 21:00:00 0.8 ug/kg/h via INTRAVENOUS
  Administered 2019-03-26: 0.4 ug/kg/h via INTRAVENOUS
  Filled 2019-03-26: qty 50

## 2019-03-26 MED ORDER — METOPROLOL TARTRATE 5 MG/5ML IV SOLN
2.5000 mg | Freq: Once | INTRAVENOUS | Status: AC
  Administered 2019-03-26: 18:00:00 2.5 mg via INTRAVENOUS

## 2019-03-26 MED ORDER — ETOMIDATE 2 MG/ML IV SOLN
20.0000 mg | Freq: Once | INTRAVENOUS | Status: AC
  Administered 2019-03-26: 20 mg via INTRAVENOUS

## 2019-03-26 MED ORDER — LORAZEPAM 2 MG/ML IJ SOLN
INTRAMUSCULAR | Status: AC
  Administered 2019-03-26: 1 mg via INTRAVENOUS
  Filled 2019-03-26: qty 1

## 2019-03-26 MED ORDER — FENTANYL CITRATE (PF) 100 MCG/2ML IJ SOLN
25.0000 ug | INTRAMUSCULAR | Status: DC | PRN
  Administered 2019-03-26 (×2): 25 ug via INTRAVENOUS

## 2019-03-26 MED ORDER — NOREPINEPHRINE 4 MG/250ML-% IV SOLN
INTRAVENOUS | Status: AC
  Administered 2019-03-26: 2 ug/min via INTRAVENOUS
  Filled 2019-03-26: qty 250

## 2019-03-26 MED ORDER — MIDAZOLAM HCL 2 MG/2ML IJ SOLN
2.0000 mg | Freq: Once | INTRAMUSCULAR | Status: AC
  Administered 2019-03-26: 19:00:00 2 mg via INTRAVENOUS

## 2019-03-26 MED ORDER — MIDAZOLAM HCL 2 MG/2ML IJ SOLN
1.0000 mg | INTRAMUSCULAR | Status: AC | PRN
  Administered 2019-03-26 (×3): 1 mg via INTRAVENOUS
  Filled 2019-03-26 (×3): qty 2

## 2019-03-26 MED ORDER — K PHOS MONO-SOD PHOS DI & MONO 155-852-130 MG PO TABS
250.0000 mg | ORAL_TABLET | Freq: Once | ORAL | Status: DC
  Filled 2019-03-26: qty 1

## 2019-03-26 MED ORDER — FENTANYL CITRATE (PF) 100 MCG/2ML IJ SOLN: 25.0000 ug | INTRAMUSCULAR | Status: DC | PRN

## 2019-03-26 MED ORDER — FUROSEMIDE 10 MG/ML IJ SOLN: 40.0000 mg | Freq: Once | INTRAMUSCULAR | Status: AC

## 2019-03-26 MED ORDER — ROCURONIUM BROMIDE 50 MG/5ML IV SOLN
50.0000 mg | Freq: Once | INTRAVENOUS | Status: AC
  Administered 2019-03-26: 80 mg via INTRAVENOUS
  Filled 2019-03-26: qty 5

## 2019-03-26 MED ORDER — MIDAZOLAM HCL 2 MG/2ML IJ SOLN
INTRAMUSCULAR | Status: AC
  Filled 2019-03-26: qty 2

## 2019-03-26 MED ORDER — LORAZEPAM 2 MG/ML IJ SOLN: 1.0000 mg | Freq: Once | INTRAMUSCULAR | Status: AC

## 2019-03-26 MED ORDER — FENTANYL CITRATE (PF) 100 MCG/2ML IJ SOLN
INTRAMUSCULAR | Status: AC
  Filled 2019-03-26: qty 2

## 2019-03-26 MED ORDER — FENTANYL CITRATE (PF) 100 MCG/2ML IJ SOLN
100.0000 ug | Freq: Once | INTRAMUSCULAR | Status: AC
  Administered 2019-03-26: 50 ug via INTRAVENOUS

## 2019-03-26 MED ORDER — HEPARIN (PORCINE) 25000 UT/250ML-% IV SOLN
900.0000 [IU]/h | INTRAVENOUS | Status: DC
  Administered 2019-03-26: 900 [IU]/h via INTRAVENOUS

## 2019-03-26 MED ORDER — NOREPINEPHRINE 4 MG/250ML-% IV SOLN
0.0000 ug/min | INTRAVENOUS | Status: DC
  Administered 2019-03-26: 10 ug/min via INTRAVENOUS
  Administered 2019-03-26: 8 ug/min via INTRAVENOUS
  Administered 2019-03-27: 4 ug/min via INTRAVENOUS
  Filled 2019-03-26: qty 250

## 2019-03-26 MED ORDER — MIDAZOLAM HCL 2 MG/2ML IJ SOLN
1.0000 mg | INTRAMUSCULAR | Status: DC | PRN
  Administered 2019-03-27 – 2019-03-28 (×9): 1 mg via INTRAVENOUS
  Filled 2019-03-26 (×9): qty 2

## 2019-03-26 MED ORDER — HEPARIN (PORCINE) 25000 UT/250ML-% IV SOLN
1000.0000 [IU]/h | INTRAVENOUS | Status: DC
  Administered 2019-03-26: 1000 [IU]/h via INTRAVENOUS
  Filled 2019-03-26: qty 250

## 2019-03-26 MED ORDER — MIDAZOLAM HCL 2 MG/2ML IJ SOLN
0.5000 mg | Freq: Four times a day (QID) | INTRAMUSCULAR | Status: DC | PRN
  Administered 2019-03-26: 0.5 mg via INTRAVENOUS
  Filled 2019-03-26: qty 2

## 2019-03-26 MED ORDER — SODIUM CHLORIDE 0.9 % IV BOLUS
1000.0000 mL | Freq: Once | INTRAVENOUS | Status: AC
  Administered 2019-03-26: 1000 mL via INTRAVENOUS

## 2019-03-26 MED ORDER — ORAL CARE MOUTH RINSE
15.0000 mL | OROMUCOSAL | Status: DC
  Administered 2019-03-26 – 2019-03-27 (×2): 15 mL via OROMUCOSAL

## 2019-03-26 MED ORDER — METOPROLOL TARTRATE 5 MG/5ML IV SOLN
INTRAVENOUS | Status: AC
  Filled 2019-03-26: qty 5

## 2019-03-26 MED ORDER — LIP MEDEX EX OINT
TOPICAL_OINTMENT | CUTANEOUS | Status: DC | PRN
  Filled 2019-03-26: qty 7

## 2019-03-26 MED ORDER — FUROSEMIDE 10 MG/ML IJ SOLN
INTRAMUSCULAR | Status: AC
  Administered 2019-03-26: 40 mg via INTRAVENOUS
  Filled 2019-03-26: qty 4

## 2019-03-26 NOTE — Progress Notes (Signed)
[place] for heparin Indication: hx atrial fibrillation (home Eliquis on hold)  No Known Allergies  Patient Measurements: Height: 5\' 5"  (165.1 cm) Weight: 171 lb 8.3 oz (77.8 kg) IBW/kg (Calculated) : 57 Heparin Dosing Weight: 73 kg  Vital Signs: Temp: 99.5 F (37.5 C) (02/24 0700) BP: 106/44 (02/24 0700) Pulse Rate: 43 (02/24 0700)  Labs: Recent Labs    03/24/19 0500 03/24/19 0500 03/25/19 0500 03/26/19 0440  HGB 8.5*   < > 8.2* 8.6*  HCT 29.4*  --  28.8* 29.8*  PLT 372  --  384 394  CREATININE 0.89  --  0.96 0.83   < > = values in this interval not displayed.    Estimated Creatinine Clearance: 56.7 mL/min (by C-G formula based on SCr of 0.83 mg/dL).   Medications:  - on Eliquis 5 mg bid PTA (last dose taken on 03/05/2019)  Assessment: Patient's a 79 y.o F with hx breast cancer, malignant recurrent pleural effusions (left pleurx placed by IR on 11/08/2018), PSVT and PAF (CHADSVASC 4) on Eliquis PTA, presented to the ED on 2/17 with SOB.  She was intubated on admission, s/p right thoracentesis on 2/18 and tPA dwell in pleurx cath on 2/19.  Pt's home eliquis was held on admission with heparin SQ started for VTE prophylaxis. Per CCM's request, start heparin on 2/24.  Today, 03/26/2019: - hgb low but stable, plts wnl - pleurex drain with slight pink tinged fluid - last heparin SQ dose given at 0523 on 2/24  Goal of Therapy:  Heparin level 0.3-0.7 units/ml Monitor platelets by anticoagulation protocol: Yes   Plan:  - d/c heparin SQ - Will be conservative with heparin dose d/t blood noted in pleurex drain. Start heparin drip at 1000 units/hr  (no bolus) - check 8 hr heparin level - monitor for severity of bleeding with pleurx drain  Lemond Griffee P 03/26/2019,8:49 AM

## 2019-03-26 NOTE — Progress Notes (Addendum)
NAME:  Beverly Tucker, MRN:  [id number], DOB:  07/06/1940, LOS: 7 ADMISSION DATE:  03/10/2019, CONSULTATION DATE:  03/30/2019 REFERRING MD:  EDP, CHIEF COMPLAINT: Acute respiratory  Brief History   79 year old with breast cancer, chronic diastolic heart failure, A. fib, CKD stage III, malignant pleural effusion presenting with hypercarbic respiratory failure, dyspnea.  Has bilateral effusions with Pleurx on the left placed by IR on 11/08/2018 with weekly drainage. Per family she gets about 400 cc of drainage each time with decreasing output over the last few weeks. Last drainage was 1 week pta. Pleurx drainage was attempted in the ED 2/17 with minimal output (10-15cc).  She was intubated in the ED for altered mental status and PCCM called for admission  Past Medical History    has a past medical history of Anemia, Cancer ([place]), Chronic diastolic CHF (congestive heart failure) ([place]), CKD (chronic kidney disease), stage III, First degree AV block, GERD (gastroesophageal reflux disease), Hypertension, Malignant pleural effusion, Metastatic breast cancer ([place]), PAF (paroxysmal atrial fibrillation) ([place]), PSVT (paroxysmal supraventricular tachycardia) ([place]), and Tachy-brady syndrome ([place]).   Breast cancer diagnosed in September 2020.  Currently on letrozole therapy  Significant Hospital Events   2/17- Admit 2/18- Thoracentesis 2/19- TPA in left pleurex with removal of 150cc fluid 2/20- On norepi for hypotension, Right pleurex placed  Consults:  PCCM  Procedures:  ETT 2/17 >> Lt IJ CVL 2/17 >> Right Side Thoracentesis-  2/18 > 1000 ml of pink tinged fluid obtained  Rt pleureX- 2/20  Significant Diagnostic Tests:  CT chest 03/15/2019-no pulmonary embolism, increase in size of bilateral pleural effusions with right middle lobe airspace density.  I have reviewed the images personally. Pleural fluid 2/18-LDH 114, total protein. Cytology-adenocarcinoma Echo 2/20-LVEF 01-74%, grade 1 diastolic  dysfunction, mild RV enlargement  Micro Data:  Blood 2/17- 1/2 coag neg staph Sputum 2/20-no growth Urine 2/17-no growth Pleural culture 2/18-no growth Urine strep pneumo, Legionella 2/17-negative Blood 2/21 > no growth  Antimicrobials:  Ceftriaxone 2/18 >> Azithromycin 2/18 >> Vanco 2/19 >>  Interim history/subjective:  Remains tachypnic during SBT. Minimal settings, I wonder if there is an anxiety component contributing to tachypnea   Scheduled Meds: . amiodarone  200 mg Per NG tube Daily  . chlorhexidine gluconate (MEDLINE KIT)  15 mL Mouth Rinse BID  . Chlorhexidine Gluconate Cloth  6 each Topical Daily  . famotidine  20 mg Per Tube Daily  . heparin  5,000 Units Subcutaneous Q8H  . letrozole  2.5 mg Oral Daily  . mouth rinse  15 mL Mouth Rinse 10 times per day  . sodium chloride flush  10-40 mL Intracatheter Q12H   Continuous Infusions: . cefTRIAXone (ROCEPHIN)  IV Stopped (03/25/19 1031)  . dexmedetomidine (PRECEDEX) IV infusion 0.8 mcg/kg/hr (03/26/19 0651)  . feeding supplement (VITAL AF 1.2 CAL) 55 mL/hr at 03/26/19 0400  . norepinephrine (LEVOPHED) Adult infusion Stopped (03/24/19 0430)   PRN Meds:.acetaminophen (TYLENOL) oral liquid 160 mg/5 mL, fentaNYL (SUBLIMAZE) injection, midazolam, sodium chloride flush   Objective   Blood pressure (!) 113/46, pulse (!) 47, temperature 99.7 F (37.6 C), resp. rate 19, height '5\' 5"'  (1.651 m), weight 77.8 kg, SpO2 100 %.    Vent Mode: PRVC FiO2 (%):  [30 %] 30 % Set Rate:  [16 bmp] 16 bmp Vt Set:  [380 mL] 380 mL PEEP:  [5 cmH20] 5 cmH20 Pressure Support:  [12 cmH20] 12 cmH20 Plateau Pressure:  [16 cmH20-22 cmH20] 20 cmH20   Intake/Output Summary (Last 24 hours)  at 03/26/2019 3335 Last data filed at 03/26/2019 0600 Gross per 24 hour  Intake 1442.63 ml  Output 601 ml  Net 841.63 ml   Filed Weights   03/24/19 0500 03/25/19 0500 03/26/19 0404  Weight: 75.1 kg 77.8 kg 77.8 kg    Examination: Gen: Chronically ill,  well nourished appearing older adult F, intubated and lightly sedated.  HEENT: NCAT pink mmm clear thick secretions. ETT OGT secure  Lungs: Even respirations, Diminished bibasilar sounds bilaterally. Bilateral pleurx.  CV:  Bradycardic rate, regular rhythm. S1s2. Cap refill < 3 seconds BUE BLE  Abd: Soft round ndnt, normoactive bowel sounds  Ext: Symmetrical bulk and tone, no obvious joint deformity. No cyanosis or clubbing  Skin: Clean, dry warm. Scattered ecchymosis.  Neuro:  Awake, alert, following commands. Psych: Anxious psychomotor activity.    Resolved Hospital Problem list     Assessment & Plan:   Acute hypercarbic respiratory failure secondary to bilateral malignant pleural effusions, CHF, suspect component of CAP (CT Chest with right middle airspace density)   2/24- vent weaning with tachypnea and Vt 224m  Plan  - WUA/SBT-- has failed previously due to tachypnea. Continue efforts at weaning to extubate -If does not tolerate weaning/not able to extubate, full vent support- VAP  - AM CXR  -Rocephin for CAP (for 7day course)  -Continue intermittent drainage from left (s/p TPA on 2/19) and right Pleurx, scheduled to drain 2/24  -Will add cpt, unclear if patient will tolerate this due to anxiety  -If does not tolerate extubation, may need trach as bridge, but hopefully extubation in well tolerated  Hx Paroxysmal atrial fibrillation -has been sinus/sinus brady on precedex  Plan  -Continue ICU monitoring -Holding  -Continue amiodarone    Acute on chronic diastolic heart failure -ECHO on 2/20 with EF 70-75, G1DD Plan  -Continue amiodarone -Holding Eliquis as above  Acute on Chronic Kidney Disease Stage 3, improved  Hypokalemia, mild  Plan  - Trend renal indices, UOP - replace K  Metastatic breast cancer Plan  -Continue letrozole  Best practice:  Diet: Tube feeds Pain/Anxiety/Delirium protocol (if indicated): Precedex, PRN fentanyl  VAP protocol (if indicated)  yes DVT prophylaxis: Heparin SQ GI prophylaxis: Pepcid Glucose control: Monitor Mobility: Bed Code Status: Full Family Communication: Discussed with patient, son declined speaking with PCCM NP 2/24 after long conversation with RN  Disposition: ICU  Critical care time:    CRITICAL CARE Performed by: GCristal Generous  Total critical care time: 40 minutes  Critical care time was exclusive of separately billable procedures and treating other patients. Critical care was necessary to treat or prevent imminent or life-threatening deterioration.  Critical care was time spent personally by me on the following activities: development of treatment plan with patient and/or surrogate as well as nursing, discussions with consultants, evaluation of patient's response to treatment, examination of patient, obtaining history from patient or surrogate, ordering and performing treatments and interventions, ordering and review of laboratory studies, ordering and review of radiographic studies, pulse oximetry and re-evaluation of patient's condition.  GEliseo GumMSN, AGACNP-BC [place]inson [phone] no answer, [phone]/24/2021, 8:03 AM

## 2019-03-26 NOTE — Progress Notes (Signed)
[facility] Progress Note Patient Name: Beverly Tucker DOB: 1940-07-29 MRN: [id number]   Date of Service  03/26/2019  HPI/Events of Note  Hypotension - BP = 100/54  eICU Interventions  Will order: 1. Monitor CVP now and Q 4 hours. 2. Norepinephrine IV infusion. Titrate to MAP >= 65.      Intervention Category Major Interventions: Hypotension - evaluation and management  Hillel Card Eugene 03/26/2019, 7:30 PM

## 2019-03-26 NOTE — Evaluation (Signed)
SLP Cancellation Note  Patient Details Name: BREEONNA LARMON MRN: [id number] DOB: 09/05/1940   Cancelled treatment:       Reason Eval/Treat Not Completed: Other (comment)(received order for swallow evaluation, thank you.  Pt intubated x7 days per chart, will follow up next date early am for evaluation.  Thanks.)   Macario Golds 03/26/2019, 11:05 AM   Kathleen Lime, MS [place] Office [phone]

## 2019-03-26 NOTE — Procedures (Signed)
Intubation Procedure Note Beverly Tucker [id number] [birth year]  Procedure: Intubation Indications: Respiratory insufficiency  Procedure Details Consent: Risks of procedure as well as the alternatives and risks of each were explained to the (patient/caregiver).  Consent for procedure obtained. Time Out: Verified patient identification, verified procedure, site/side was marked, verified correct patient position, special equipment/implants available, medications/allergies/relevent history reviewed, required imaging and test results available.  Performed  Drugs Versed 2mg , Fentanyl 31mcg IV, Etomidate 20mg , Rocuronium 100mg  IV DL x 1 with GS4 blade Grade 1 view 7.5 ET tube passed through cords under direct visualization Placement confirmed with bilateral breath sounds, positive EtCO2 change and smoke in tube   Evaluation Hemodynamic Status: BP stable throughout; O2 sats: stable throughout Patient's Current Condition: stable Complications: No apparent complications Patient did tolerate procedure well. Chest X-ray ordered to verify placement.  CXR: pending.   Beverly Tucker 03/26/2019

## 2019-03-26 NOTE — Procedures (Signed)
Extubation Procedure Note  Patient Details:   Name: Beverly Tucker DOB: 10/14/1940 MRN: [id number]   Airway Documentation:    Vent end date: 03/26/19 Vent end time: 1029   Evaluation  O2 sats: stable throughout Complications: No apparent complications Patient did tolerate procedure well. Bilateral Breath Sounds: Clear, Diminished   Yes  Johnette Abraham 03/26/2019, 10:30 AM

## 2019-03-26 NOTE — Progress Notes (Signed)
[facility] PCCM  Called emergently to bedside to intubate Ms. Calendine due to worsening dyspnea, hypoxemia, tachycardia this evening despite BIPAP, ativan administration.  Attempted to contact the patient's son, he did not answer.  Plan intubation, sedation.  Need to continue to address goals of care.  [name], MD [place] PCCM Pager: [phone] Cell: [phone] If no response, call [phone]

## 2019-03-26 NOTE — Progress Notes (Signed)
[facility] Progress Note Patient Name: MALENIE MCKEAN DOB: January 25, 1941 MRN: [id number]   Date of Service  03/26/2019  HPI/Events of Note  Multiple issues: 1. Episode of bradycardia into the 50's and SBP dropped into the 40's. Norepinephrine IV infusion started by Nursing. CVP = 5 and 2. ABG on 30%/PRVC 16/TV 380/P 5 = 7.26/61.5/89.8.  eICU Interventions  Will order: 1. Bolus with 0.9 NaCl 1 Liter IV over 1 hour now.  2. D/C Precedex IV infusion. 3. Fentanyl IV infusion. Titrate to MAP >= 65.  4. Increase PRVC rate to 20.  5. Repeat ABG at 1 AM.      Intervention Category Major Interventions: Hypotension - evaluation and management;Arrhythmia - evaluation and management  Sommer,Steven Eugene 03/26/2019, 10:52 PM

## 2019-03-26 NOTE — Progress Notes (Signed)
R plurex drained 150 pink-yellow tinge fluid and L plurex drained 10 cc of yellow tinge

## 2019-03-26 NOTE — Progress Notes (Signed)
Approximatley 1730 RN entered room to find patient in distress stating she is anxious. Heart rate was elevated from previous recordings earlier in day. HR sustaining 100 and above. RN gave 0.5 mg Versed with no relief. HR continued to increase to 130s with increased PVCs,  BP also elevated above 190. RN paged oncall CCM provider with no response, then paged [facility] APP, orders given for 2.5 metoprolol and BIPAP. McQuaid MD paged to come see pt at bedside. Pt placed on bipap and metoprolol given with heart rate dropping to 90s and BP dropping to 140s. Pt then intubated after McQuaid assessment. given  50 mcg fentanyl, 2 mg versed, 20 Etomidate and 80 of Roc.

## 2019-03-26 NOTE — Progress Notes (Signed)
ANTICOAGULATION CONSULT NOTE   Pharmacy Consult for heparin Indication: hx atrial fibrillation (home Eliquis on hold)  No Known Allergies  Patient Measurements: Height: 5\' 5"  (165.1 cm) Weight: 171 lb 8.3 oz (77.8 kg) IBW/kg (Calculated) : 57 Heparin Dosing Weight: 73 kg  Vital Signs: Temp: 99.2 F (37.3 C) (02/24 1949) Temp Source: Oral (02/24 1949) BP: 98/52 (02/24 1910) Pulse Rate: 93 (02/24 1910)  Labs: Recent Labs    03/25/19 0500 03/25/19 0500 03/26/19 0440 03/26/19 0908 03/26/19 1922  HGB 8.2*   < > 8.6* 8.6*  --   HCT 28.8*  --  29.8* 29.8*  --   PLT 384  --  394 364  --   HEPARINUNFRC  --   --   --   --  0.73*  CREATININE 0.96  --  0.83  --  1.07*   < > = values in this interval not displayed.    Estimated Creatinine Clearance: 43.9 mL/min (A) (by C-G formula based on SCr of 1.07 mg/dL (H)).   Medications:  - on Eliquis 5 mg bid PTA (last dose taken on 03/07/2019)  Assessment: Patient's a 79 y.o F with hx breast cancer, malignant recurrent pleural effusions (left pleurx placed by IR on 11/08/2018), PSVT and PAF (CHADSVASC 4) on Eliquis PTA, presented to the ED on 2/17 with SOB.  She was intubated on admission, s/p right thoracentesis on 2/18 and tPA dwell in pleurx cath on 2/19.  Pt's home eliquis was held on admission with heparin SQ started for VTE prophylaxis. Per CCM's request, start IV heparin on 2/24.  Today, 03/26/2019:  - Initial heparin level slightly elevated to 0.73 units/mL - hgb low but stable, plts wnl - pleurex draining pink-yellow tinged fluid - no infusion issues noted per nursing   Goal of Therapy:  Heparin level 0.3-0.7 units/ml Monitor platelets by anticoagulation protocol: Yes   Plan:  - decrease heparin infusion to 900 units/hour   - check 8 hr heparin level - daily CBC and heparin level  - monitor for severity of bleeding with pleurx drain  Darnelle Bos 03/26/2019,9:09 PM

## 2019-03-27 ENCOUNTER — Inpatient Hospital Stay (HOSPITAL_COMMUNITY): Payer: Medicare HMO

## 2019-03-27 ENCOUNTER — Other Ambulatory Visit: Payer: Self-pay

## 2019-03-27 DIAGNOSIS — Z7189 Other specified counseling: Secondary | ICD-10-CM

## 2019-03-27 DIAGNOSIS — I959 Hypotension, unspecified: Secondary | ICD-10-CM

## 2019-03-27 LAB — BASIC METABOLIC PANEL
Anion gap: 8 (ref 5–15)
BUN: 25 mg/dL — ABNORMAL HIGH (ref 8–23)
CO2: 26 mmol/L (ref 22–32)
Calcium: 7.3 mg/dL — ABNORMAL LOW (ref 8.9–10.3)
Chloride: 113 mmol/L — ABNORMAL HIGH (ref 98–111)
Creatinine, Ser: 0.71 mg/dL (ref 0.44–1.00)
GFR calc Af Amer: >60 mL/min (ref >60)
GFR calc non Af Amer: >60 mL/min (ref >60)
Glucose, Bld: 117 mg/dL — ABNORMAL HIGH (ref 70–99)
Potassium: 3.1 mmol/L — ABNORMAL LOW (ref 3.5–5.1)
Sodium: 147 mmol/L — ABNORMAL HIGH (ref 135–145)

## 2019-03-27 LAB — GLUCOSE, CAPILLARY
Glucose-Capillary: 103 mg/dL — ABNORMAL HIGH (ref 70–99)
Glucose-Capillary: 111 mg/dL — ABNORMAL HIGH (ref 70–99)

## 2019-03-27 LAB — PHOSPHORUS
Phosphorus: 3.2 mg/dL (ref 2.5–4.6)
Phosphorus: 2.2 mg/dL — ABNORMAL LOW (ref 2.5–4.6)

## 2019-03-27 LAB — CBC
HCT: 30.7 % — ABNORMAL LOW (ref 36.0–46.0)
Hemoglobin: 8.7 g/dL — ABNORMAL LOW (ref 12.0–15.0)
MCH: 25.5 pg — ABNORMAL LOW (ref 26.0–34.0)
MCHC: 28.3 g/dL — ABNORMAL LOW (ref 30.0–36.0)
MCV: 90 fL (ref 80.0–100.0)
Platelets: 535 10*3/uL — ABNORMAL HIGH (ref 150–400)
RBC: 3.41 MIL/uL — ABNORMAL LOW (ref 3.87–5.11)
RDW: 15.6 % — ABNORMAL HIGH (ref 11.5–15.5)
WBC: 13.3 10*3/uL — ABNORMAL HIGH (ref 4.0–10.5)
nRBC: 0 % (ref 0.0–0.2)

## 2019-03-27 LAB — HEPARIN LEVEL (UNFRACTIONATED)
Heparin Unfractionated: 0.37 IU/mL (ref 0.30–0.70)
Heparin Unfractionated: 0.15 IU/mL — ABNORMAL LOW (ref 0.30–0.70)
Heparin Unfractionated: 0.34 IU/mL (ref 0.30–0.70)

## 2019-03-27 LAB — MAGNESIUM
Magnesium: 2 mg/dL (ref 1.7–2.4)
Magnesium: 1.9 mg/dL (ref 1.7–2.4)

## 2019-03-27 MED ORDER — ORAL CARE MOUTH RINSE
15.0000 mL | OROMUCOSAL | Status: DC
  Administered 2019-03-27 – 2019-03-29 (×24): 15 mL via OROMUCOSAL

## 2019-03-27 MED ORDER — POTASSIUM CHLORIDE 10 MEQ/50ML IV SOLN
10.0000 meq | INTRAVENOUS | Status: AC
  Administered 2019-03-27 (×4): 10 meq via INTRAVENOUS
  Filled 2019-03-27 (×3): qty 50

## 2019-03-27 MED ORDER — VITAL HIGH PROTEIN PO LIQD
1000.0000 mL | ORAL | Status: DC
  Administered 2019-03-27 – 2019-03-28 (×2): 1000 mL

## 2019-03-27 MED ORDER — METOPROLOL TARTRATE 5 MG/5ML IV SOLN
2.5000 mg | INTRAVENOUS | Status: DC | PRN
  Administered 2019-03-27: 2.5 mg via INTRAVENOUS
  Filled 2019-03-27: qty 5

## 2019-03-27 MED ORDER — HEPARIN (PORCINE) 25000 UT/250ML-% IV SOLN
1050.0000 [IU]/h | INTRAVENOUS | Status: DC
  Administered 2019-03-27: 950 [IU]/h via INTRAVENOUS
  Filled 2019-03-27 (×2): qty 250

## 2019-03-27 MED ORDER — POTASSIUM PHOSPHATES 15 MMOLE/5ML IV SOLN
10.0000 mmol | Freq: Once | INTRAVENOUS | Status: AC
  Administered 2019-03-27: 10 mmol via INTRAVENOUS
  Filled 2019-03-27: qty 3.33

## 2019-03-27 MED ORDER — CHLORHEXIDINE GLUCONATE 0.12% ORAL RINSE (MEDLINE KIT)
15.0000 mL | Freq: Two times a day (BID) | OROMUCOSAL | Status: DC
  Administered 2019-03-27 – 2019-03-29 (×5): 15 mL via OROMUCOSAL

## 2019-03-27 MED ORDER — SODIUM PHOSPHATES 45 MMOLE/15ML IV SOLN: 10.0000 mmol | Freq: Once | INTRAVENOUS | Status: DC

## 2019-03-27 MED ORDER — FENTANYL BOLUS VIA INFUSION
25.0000 ug | INTRAVENOUS | Status: DC | PRN
  Administered 2019-03-27 – 2019-03-28 (×9): 50 ug via INTRAVENOUS
  Filled 2019-03-27: qty 50

## 2019-03-27 MED ORDER — PHENYLEPHRINE HCL-NACL 10-0.9 MG/250ML-% IV SOLN: 0.0000 ug/min | INTRAVENOUS | Status: DC

## 2019-03-27 NOTE — Progress Notes (Signed)
ANTICOAGULATION CONSULT NOTE - Follow Up Consult  Pharmacy Consult for Heparin Indication: hx atrial fibrillation (home Eliquis on hold)  No Known Allergies  Patient Measurements: Height: 5\' 5"  (165.1 cm) Weight: 166 lb 10.7 oz (75.6 kg) IBW/kg (Calculated) : 57 Heparin Dosing Weight:   Vital Signs: Temp: 98.9 F (37.2 C) (02/25 0342) Temp Source: Oral (02/25 0342) BP: 119/68 (02/25 0645) Pulse Rate: 62 (02/25 0645)  Labs: Recent Labs    03/26/19 0440 03/26/19 0440 03/26/19 0908 03/26/19 1922 03/27/19 0426 03/27/19 0606  HGB 8.6*   < > 8.6*  --  8.7*  --   HCT 29.8*  --  29.8*  --  30.7*  --   PLT 394  --  364  --  535*  --   HEPARINUNFRC  --   --   --  0.73*  --  0.37  CREATININE 0.83  --   --  1.07* 0.71  --    < > = values in this interval not displayed.    Estimated Creatinine Clearance: 58 mL/min (by C-G formula based on SCr of 0.71 mg/dL).   Medications:  Infusions:  . sodium chloride Stopped (03/27/19 [id number])  . fentaNYL infusion INTRAVENOUS 200 mcg/hr (03/27/19 0650)  . heparin 900 Units/hr (03/27/19 0650)  . norepinephrine (LEVOPHED) Adult infusion 4 mcg/min (03/27/19 0650)  . potassium chloride 50 mL/hr at 03/27/19 [id number]    Assessment: Patient with heparin level now at goal. No heparin issues noted.  Goal of Therapy:  Heparin level 0.3-0.7 units/ml Monitor platelets by anticoagulation protocol: Yes   Plan:  Continue heparin drip at current rate Recheck level at [place], [place] 03/27/2019,6:53 AM

## 2019-03-27 NOTE — Progress Notes (Signed)
Pt continues to be very agitated.  Precedex started and titrated.

## 2019-03-27 NOTE — Progress Notes (Signed)
Unable to do cpt at this time pt very anxious

## 2019-03-27 NOTE — Progress Notes (Signed)
Pt continues to be very agitated and restless.  Precedex at .8 mcq.  1 mg Versed given.

## 2019-03-27 NOTE — Plan of Care (Signed)
  Problem: Coping: Goal: Level of anxiety will decrease Outcome: Not Progressing Patient has needed increasing continuous dosage of Fentanyl and IV push Versed to maintain RASS near goal.   Problem: Education: Goal: Knowledge of General Education information will improve Description: Including pain rating scale, medication(s)/side effects and non-pharmacologic comfort measures Outcome: Progressing   Problem: Health Behavior/Discharge Planning: Goal: Ability to manage health-related needs will improve Outcome: Progressing   Problem: Clinical Measurements: Goal: Ability to maintain clinical measurements within normal limits will improve Outcome: Progressing Goal: Will remain free from infection Outcome: Progressing Goal: Diagnostic test results will improve Outcome: Progressing Goal: Respiratory complications will improve Outcome: Progressing Goal: Cardiovascular complication will be avoided Outcome: Progressing   Problem: Activity: Goal: Risk for activity intolerance will decrease Outcome: Progressing   Problem: Nutrition: Goal: Adequate nutrition will be maintained Outcome: Progressing   Problem: Elimination: Goal: Will not experience complications related to bowel motility Outcome: Progressing Goal: Will not experience complications related to urinary retention Outcome: Progressing   Problem: Pain Managment: Goal: General experience of comfort will improve Outcome: Progressing   Problem: Safety: Goal: Ability to remain free from injury will improve Outcome: Progressing   Problem: Skin Integrity: Goal: Risk for impaired skin integrity will decrease Outcome: Progressing   Problem: Activity: Goal: Ability to tolerate increased activity will improve Outcome: Progressing   Problem: Respiratory: Goal: Ability to maintain a clear airway and adequate ventilation will improve Outcome: Progressing   Problem: Role Relationship: Goal: Method of communication will  improve Outcome: Progressing

## 2019-03-27 NOTE — Progress Notes (Signed)
PT Cancellation Note  Patient Details Name: Beverly Tucker MRN: [id number] DOB: July 20, 1940   Cancelled Treatment:     PT order received but eval deferred - pt placed on vent overnight.  Will follow.   Oveda Dadamo 03/27/2019, 8:50 AM

## 2019-03-27 NOTE — Progress Notes (Signed)
Pt heart rate suddenly dropped to 50's, BP decreased to SBP 60.  Precedex immediately stopped.  Norepinephrine started.   Charge Nurse to bedside.  NS 200cc given per Rapid response protocol.  [place] notified.  HR increased, BP increased with fluids/ Norephrine.

## 2019-03-27 NOTE — Progress Notes (Signed)
Pt BP dropped to mid 80's/40's.  Dr. Oletta Darter notified.  Orders written.  Will monitor closely

## 2019-03-27 NOTE — Progress Notes (Signed)
OT Cancellation Note/Evaluation  Patient Details Name: Beverly Tucker MRN: [id number] DOB: Jul 21, 1940   Cancelled Treatment:    Reason Eval/Treat Not Completed: Medical issues which prohibited therapy. Patient required re-intubation on vent and is not appropriate for skilled OT evaluation at this time.  Beverly Tucker OTR/L   Beverly Tucker 03/27/2019, 9:39 AM

## 2019-03-27 NOTE — Progress Notes (Signed)
Palliative Medicine Team consult was received.   Attempted to reach son.  Left voicemail.  We will schedule a meeting with family at the earliest possible time we have a provider available and family can meet.   If there are urgent needs or questions please call [phone]. Thank you for consulting out team to assist with this patients care.  Micheline Rough, MD [facility] Palliative Medicine Team [phone]  NO CHARGE NOTE

## 2019-03-27 NOTE — Progress Notes (Signed)
RN drained 350 cc of yellow tinged fluid from Right plurex catheter. Pt tolerated well. Will continue to monitor.

## 2019-03-27 NOTE — Progress Notes (Signed)
Pt BP increased w/o intervention to 120/60s.  Will continue to monitor.  Norepinephrine at IV pump and will start if needed.

## 2019-03-27 NOTE — Progress Notes (Signed)
Pt heart rate sustaining 120-130 bpm. EKG completed and placed in chart. [facility] APP notified and order given for 2.5-5 mg of metoprolol- 2.5 mg given to pt  Heart rate decreased to 80 bpm. Will continue to monitor

## 2019-03-27 NOTE — Progress Notes (Signed)
RT unable to obtain ABG. Pt very agitated even after Fentanyl bolus and increase in drip.   Dr. Oletta Darter Aware per RT.  Will Attempt in am.

## 2019-03-27 NOTE — Progress Notes (Addendum)
PCCM Family Communication  I spoke with patient's son, [name] regarding patient updates. We additionally discussed goals of care-- I shared the conversation I had with the patient (see 2/25 prog note) and discussed various care trajectories moving forward. I recommended consideration of a 1-way extubation after medical optimization. [name] will discuss with his family-- he reveals he feels overwhelmed at navigating goals of care and code status conversations. I advised [name] of the function of the Palliative Care Medicine team and shared that I have consulted for their assistance in navigating these challenging decisions. He is appreciative for their upcoming engagement. All questions answered.  Eliseo Gum MSN, AGACNP-BC [place] [phone] If no answer, [phone] 03/27/2019, 11:33 AM

## 2019-03-27 NOTE — Progress Notes (Signed)
CPT on hold due to pt weaning on vent. Pt has high anxiety. Pt doing well  At this time.

## 2019-03-27 NOTE — Progress Notes (Signed)
Nutrition Follow-up  RD working remotely.   DOCUMENTATION CODES:   Not applicable  INTERVENTION:  - will order TF: Vital High Protein @ 30 ml/hr to advance by 10 ml every 8 hours to reach goal rate of 60 ml/hr. - at goal rate, this regimen will provides 1440 kcal (102.6% estimated kcal need), 126 grams protein, and 1204 ml free water.  - free water flush, if desired, to be per MD/NP given ongoing bilateral pleural effusions.  Monitor magnesium, potassium, and phosphorus daily for at least 3 days, MD to replete as needed, as pt is at risk for refeeding syndrome given inadequate nutrition for >1 week and current hypokalemia.    NUTRITION DIAGNOSIS:   Inadequate oral intake related to inability to eat as evidenced by NPO status. -ongoing  GOAL:   Patient will meet greater than or equal to 90% of their needs -to be met with TF regimen   MONITOR:   Vent status, TF tolerance, Labs, Weight trends  REASON FOR ASSESSMENT:   Consult Enteral/tube feeding initiation and management  ASSESSMENT:   79 year old female with medical history of breast cancer, CHF, A. fib, stage 3 CKD, and malignant pleural effusion. She presented to the ED with hypercarbic respiratory failure and dyspnea. She has bilateral effusions with Pleurx on the left placed by IR on 11/08/2018 with weekly drainage. Per family, she gets about 400 cc of drainage each time with decreasing output over the last few weeks. Last drainage was 1 week ago. Today Pleurx drainage was attempted in the ED with minimal output (10-15cc).  She was intubated in the ED for AMS.  Significant Events: 2/17- intubation in the ED; admission 2/18- initial RD assessment 2/22- TF initiation  2/24- extubation at 1030; re-intubation at 1905 2/25- TF initiation   Patient was extubated and subsequently re-intubated yesterday and OGT replaced. She remains on the vent at this time. Will order TF as outlined above. Weight has been fairly stable since  admission.  Per notes: - acute hypercarbic and hypoxic respiratory failure 2/2 bilateral malignant pleural effusions and CHF with suspected CAP - acute on stage 3 CKD--improving - metastatic breast cancer - Palliative Care has been consulted to aid in [place]   Patient is currently intubated on ventilator support MV: 5.3 L/min Temp (24hrs), Avg:98.9 F (37.2 C), Min:98.1 F (36.7 C), Max:99.5 F (37.5 C) Propofol: none     Labs reviewed; Na: 147 mmol/l, K: 3.1 mmol/l, Cl: 113 mmol/l, BUN: 25 mg/dl, Ca: 7.3 mg/dl. Medications reviewed; 20 mg pepcid per tube/day, 40 mg IV lasix x1 dose 2/24, 21 mmol IV KPhos x1 run 2/24. Drips; fentanyl @ 200 mcg/hr, heparin @ 900 units/hr.   Diet Order:   Diet Order            Diet NPO time specified  Diet effective now              EDUCATION NEEDS:   No education needs have been identified at this time  Skin:  Skin Assessment: Skin Integrity Issues: Skin Integrity Issues:: Stage I Stage I: sacrum (new documentation 2/22)  Last BM:  2/24  Height:   Ht Readings from Last 1 Encounters:  03/21/19 '5\' 5"'  (1.651 m)    Weight:   Wt Readings from Last 1 Encounters:  03/27/19 75.6 kg    Ideal Body Weight:  56.8 kg  BMI:  Body mass index is 27.73 kg/m.  Estimated Nutritional Needs:   Kcal:  1403 kcal  Protein:  113-128 grams (1.5-1.7  grams/kg)  Fluid:  >/= 1.8 L/day     Jarome Matin, MS, RD, LDN, CNSC Inpatient Clinical Dietitian RD pager # available in [facility]  After hours/weekend pager # available in [facility]

## 2019-03-27 NOTE — Progress Notes (Signed)
[place] for heparin Indication: hx atrial fibrillation (home Eliquis on hold)  No Known Allergies  Patient Measurements: Height: 5\' 5"  (165.1 cm) Weight: 166 lb 10.7 oz (75.6 kg) IBW/kg (Calculated) : 57 Heparin Dosing Weight: 73 kg  Vital Signs: Temp: 99 F (37.2 C) (02/25 1234) Temp Source: Oral (02/25 0342) BP: 108/78 (02/25 1230) Pulse Rate: 129 (02/25 1230)  Labs: Recent Labs    03/26/19 0440 03/26/19 0440 03/26/19 0908 03/26/19 1922 03/27/19 0426 03/27/19 0606  HGB 8.6*   < > 8.6*  --  8.7*  --   HCT 29.8*  --  29.8*  --  30.7*  --   PLT 394  --  364  --  535*  --   HEPARINUNFRC  --   --   --  0.73*  --  0.37  CREATININE 0.83  --   --  1.07* 0.71  --    < > = values in this interval not displayed.    Estimated Creatinine Clearance: 58 mL/min (by C-G formula based on SCr of 0.71 mg/dL).   Medications:  - on Eliquis 5 mg bid PTA (last dose taken on 03/12/2019)  Assessment: Patient's a 79 y.o F with hx breast cancer, malignant recurrent pleural effusions (left pleurx placed by IR on 11/08/2018), PSVT and PAF (CHADSVASC 4) on Eliquis PTA, presented to the ED on 2/17 with SOB.  She was intubated on admission, s/p right thoracentesis on 2/18, tPA dwell in pleurx cath on 2/19, and right pleurx placed on 2/20.  Pt's home eliquis was held on admission with heparin SQ started for VTE prophylaxis. Per CCM's request, start heparin on 2/24.  Today, 03/27/2019: - heparin level is sub-therapeutic at 0.15. Per pt's RN, level drawn at appropriate site. IV line running ok. No bleeding noted. No blood noted in pleurx drain - hgb low but stable, plts high  Goal of Therapy:  Heparin level 0.3-0.7 units/ml Monitor platelets by anticoagulation protocol: Yes   Plan:  - increase heparin drip to 950 units/hr  - check 8 hr heparin level - monitor for severity of bleeding with pleurx drain  Beverly Tucker P 03/27/2019,2:29 PM

## 2019-03-27 NOTE — Progress Notes (Signed)
Pt continues to be very alert.  Fentanyl at 100 mcqs, PRN sedation already given.  Pt writing on paper that she wants to drink, eat.  RN explained to patient that she cannot eat/drink on the ventilator because of aspiration.   Dr. Oletta Darter notified of continued agitation.  Orders to continue to titrate Fentanyl.

## 2019-03-27 NOTE — Progress Notes (Signed)
NAME:  Beverly Tucker, MRN:  [id number], DOB:  01/24/1941, LOS: 8 ADMISSION DATE:  03/16/2019, CONSULTATION DATE:  03/04/2019 REFERRING MD:  EDP, CHIEF COMPLAINT: Acute respiratory  Brief History   79 year old with breast cancer, chronic diastolic heart failure, A. fib, CKD stage III, malignant pleural effusion presenting with hypercarbic respiratory failure, dyspnea.  Has bilateral effusions with Pleurx on the left placed by IR on 11/08/2018 with weekly drainage. Per family she gets about 400 cc of drainage each time with decreasing output over the last few weeks. Last drainage was 1 week pta. Pleurx drainage was attempted in the ED 2/17 with minimal output (10-15cc).  She was intubated in the ED for altered mental status and PCCM called for admission  Past Medical History    has a past medical history of Anemia, Cancer ([place]), Chronic diastolic CHF (congestive heart failure) ([place]), CKD (chronic kidney disease), stage III, First degree AV block, GERD (gastroesophageal reflux disease), Hypertension, Malignant pleural effusion, Metastatic breast cancer ([place]), PAF (paroxysmal atrial fibrillation) ([place]), PSVT (paroxysmal supraventricular tachycardia) ([place]), and Tachy-brady syndrome ([place]).   Breast cancer diagnosed in September 2020.  Currently on letrozole therapy  Significant Hospital Events   2/17- Admit 2/18- Thoracentesis 2/19- TPA in left pleurex with removal of 150cc fluid 2/20- On norepi for hypotension, Right pleurex placed  Consults:  PCCM  Procedures:  ETT 2/17 >> Lt IJ CVL 2/17 >> Right Side Thoracentesis-  2/18 > 1000 ml of pink tinged fluid obtained  Rt pleureX- 2/20  Significant Diagnostic Tests:  CT chest 03/20/2019-no pulmonary embolism, increase in size of bilateral pleural effusions with right middle lobe airspace density.  I have reviewed the images personally. Pleural fluid 2/18-LDH 114, total protein. Cytology-adenocarcinoma Echo 2/20-LVEF 33-43%, grade 1 diastolic  dysfunction, mild RV enlargement  Micro Data:  Blood 2/17- 1/2 coag neg staph Sputum 2/20-no growth Urine 2/17-no growth Pleural culture 2/18-no growth Urine strep pneumo, Legionella 2/17-negative Blood 2/21 > no growth  Antimicrobials:  Ceftriaxone 2/18 >2/24 Azithromycin 2/18 >2/23 Vanco 2/19 >2/23  Interim history/subjective:  Re-intubated late in shift 2/24.  Overnight hypotensive, started on NE. This is weaning this morning.  Weaning 2/25 with similar Vt as yesterday, variable between 250 and 380m     Scheduled Meds: . amiodarone  200 mg Per NG tube Daily  . chlorhexidine gluconate (MEDLINE KIT)  15 mL Mouth Rinse BID  . Chlorhexidine Gluconate Cloth  6 each Topical Daily  . famotidine  20 mg Per Tube Daily  . letrozole  2.5 mg Oral Daily  . mouth rinse  15 mL Mouth Rinse 10 times per day  . sodium chloride flush  10-40 mL Intracatheter Q12H   Continuous Infusions: . sodium chloride Stopped (03/27/19 0614)  . fentaNYL infusion INTRAVENOUS 200 mcg/hr (03/27/19 0650)  . heparin 900 Units/hr (03/27/19 0650)  . norepinephrine (LEVOPHED) Adult infusion 4 mcg/min (03/27/19 0650)  . potassium chloride 50 mL/hr at 03/27/19 0650   PRN Meds:.acetaminophen (TYLENOL) oral liquid 160 mg/5 mL, fentaNYL, lip balm, midazolam, sodium chloride flush   Objective   Blood pressure 119/68, pulse 62, temperature 98.9 F (37.2 C), temperature source Oral, resp. rate 18, height '5\' 5"'  (1.651 m), weight 75.6 kg, SpO2 100 %. CVP:  [5 mmHg-11 mmHg] 7 mmHg  Vent Mode: PRVC FiO2 (%):  [30 %] 30 % Set Rate:  [16 bmp-20 bmp] 20 bmp Vt Set:  [380 mL] 380 mL PEEP:  [5 cmH20] 5 cmH20 Plateau Pressure:  [18 cmH20-20 cmH20] 18 cmH20  Intake/Output Summary (Last 24 hours) at 03/27/2019 0738 Last data filed at 03/27/2019 0650 Gross per 24 hour  Intake 2374.26 ml  Output 1050 ml  Net 1324.26 ml   Filed Weights   03/25/19 0500 03/26/19 0404 03/27/19 0447  Weight: 77.8 kg 77.8 kg 75.6 kg     Examination: Gen: Chronically and critically ill appearing older adult F, intubated, sedated NAD  HEENT: NCAT anicteric sclera, tacky pink mm. ETT OGT secure.  Lungs: Symmetrical chest expansion. Shallow, unlabored respirations on PSV/CPAP. CTA bilaterally   CV: Sinus tachycardia. s1s2 no rgm. 1+ radial pulses  Abd: Soft round ndnt, normoactive  Ext: Symmetrical bulk and tone. No obvious joint deformity. No cyanosis or clubbing  Skin: c/d/w, scattered ecchymosis  Neuro:  Lightly sedated, awake, following commands. Communicating via pen/paper. PERRLA Psych: Tearful, anxious appearing.    Resolved Hospital Problem list     Assessment & Plan:   Acute hypercarbic and hypoxic respiratory failure requiring re-intubation  - secondary to bilateral malignant pleural effusions, CHF, suspected CAP CAP , s/p abx course  -Extubated 2/24 AM. Reintubated 2/24 PM.  Plan  - Continue current vent support - AM CXR  -Continue intermittent drainage from left (s/p TPA on 2/19) and right Pleurx -Will add cpt, unclear if patient will tolerate this due to anxiety  -Patient failed extubation 2/24. Realistic pathways include continuing optimizing efforts over the next few days with 1-way extubation vs tracheostomy. I recommend 1-way extubation given severity of medical comorbidities.  Hypotension requiring pressors  -suspect related to medications (started after precedex up titrated overnight) and is improving with fentanyl gtt -other considerations would be hypovolemic shock in setting of diuresis 2/24, septic shock (interval increase in WBC although this is nearly WNL), cardiogenic shock (hx diastolic HF).  Plan -continue weaning NE for goal MAP > 65 -trend WBC, CBC  -Can cease CVP monitoring   Hx Paroxysmal atrial fibrillation -has been sinus/sinus brady on precedex  Plan  -Continue ICU monitoring -Continue amiodarone   -on heparin gtt  Acute on chronic diastolic heart failure -ECHO on 2/20  with EF 70-75, G1DD Plan  -supportive care  Acute on Chronic Kidney Disease Stage 3, improved  Plan  -Trend renal indices, UOP  Hypokalemia, mild  Plan -replacing K 2/25  Metastatic breast cancer Plan  -Holding Letrozole as this medication cannot be crushed/given per tube.   Goals of Care: -discussed w pt 2/24 after regaining voice following extubation. Articulated desire to remain full code, with reintubation for respiratory failure -Re-intubated 2/24 for hypercarbic/hypoxic respiratory failure refractory to BiPAP   -Transparent conversation between myself, Dr. [place] Bells and the patient's son after reintubation that maintaining life support measures is not a sustainable solution given the patient's underlying conditions. Son remains hopeful and feels that overall she is improving.  -I had long conversation with pt 2/25 (communicates via pen/paper while on vent) about her critical/chronic illness course. I discussed that unfortunately while a vent may help small problems like hypoxia and hypercarbia, it does not fix the underlying causes of respiratory failure (dibility, diaphragmatic weakness, deconditioning, persistent effusions due to metastatic disease) and in fact will likely perpetuate these issues. We discussed various options: 1) liberation from ventilator for intentional comfort care, 2) attempted optimization with 1-way extubation, understanding that if extubation is not tolerated we would transition to comfort care 3) consideration of measures such as tracheostomy and likely ongoing ventilator support.  -I recommended medical optimization for short period of time with 1-way extubation. I feel this is  more favorable than pursuing tracheostomy due to the patient's metastatic disease and degree of debility. I certainly do not think that the patient should be re-intubated if she is again extubated, and do not feel that a prolonged period of reintubation is medically not appropriate as it  will not likely yield improvements in diaphragmatic debility.  -Patient is considering these options. I will engage Palliative care for further assistance; patient and son have both articulated feelings that God will provide medical resolution and I worry that both parties are not fully understanding the limitations of earthly medical intervention despite PCCM efforts to explain.   Best practice:  Diet: EN per RDN Pain/Anxiety/Delirium protocol (if indicated): Precedex, PRN fentanyl  VAP protocol (if indicated) yes DVT prophylaxis: Heparin gtt GI prophylaxis: Pepcid Glucose control: Monitor Mobility: Bed Code Status: Full Family Communication: Pending 2/25 Disposition: ICU  Critical care time:    CRITICAL CARE Performed by: Cristal Generous   Total critical care time: 68 minutes  Critical care time was exclusive of separately billable procedures and treating other patients. Critical care was necessary to treat or prevent imminent or life-threatening deterioration.  Critical care was time spent personally by me on the following activities: development of treatment plan with patient and/or surrogate as well as nursing, discussions with consultants, evaluation of patient's response to treatment, examination of patient, obtaining history from patient or surrogate, ordering and performing treatments and interventions, ordering and review of laboratory studies, ordering and review of radiographic studies, pulse oximetry and re-evaluation of patient's condition.  Eliseo Gum MSN, AGACNP-BC [place] [phone] If no answer, [phone] 03/27/2019, 7:38 AM

## 2019-03-27 NOTE — Progress Notes (Signed)
Pt very agitated.  PRN Versed given with short relief.

## 2019-03-27 NOTE — Progress Notes (Signed)
eLink Physician-Brief Progress Note Patient Name: Beverly Tucker DOB: 1940/10/04 MRN: [id number]   Date of Service  03/27/2019  HPI/Events of Note  Agitation - Request to change Fentanyl from IVP to bolus from infusion.   eICU Interventions  Will change Fentanyl PRN to bolus from infusion.      Intervention Category Major Interventions: Other:  Beverly Tucker Cornelia Copa 03/27/2019, 12:11 AM

## 2019-03-27 NOTE — Progress Notes (Signed)
[facility] ADULT ICU REPLACEMENT PROTOCOL FOR AM LAB REPLACEMENT ONLY  The patient does apply for the [facility] Adult ICU Electrolyte Replacment Protocol based on the criteria listed below:   1. Is GFR >/= 40 ml/min? Yes.    Patient's GFR today is >60 2. Is urine output >/= 0.5 ml/kg/hr for the last 6 hours? Yes.   Patient's UOP is 1 ml/kg/hr 3. Is BUN < 60 mg/dL? Yes.    Patient's BUN today is 25 4. Abnormal electrolyte(s): K-3.1 5. Ordered repletion with: per protocol 6. If a panic level lab has been reported, has the CCM MD in charge been notified? Yes.  .   Physician:  Dr. Terrill Mohr, Philis Nettle 03/27/2019 5:59 AM

## 2019-03-27 NOTE — Evaluation (Signed)
OT Cancellation Note  Patient Details Name: Beverly Tucker MRN: [id number] DOB: 12/08/1940   Cancelled Treatment:    Reason Eval/Treat Not Completed: Medical issues which prohibited therapy  Macario Golds 03/27/2019, 10:32 AM Kathleen Lime, MS [place] Office [phone]

## 2019-03-27 NOTE — Progress Notes (Signed)
[facility] Progress Note Patient Name: Beverly Tucker DOB: 1940-11-10 MRN: [id number]   Date of Service  03/27/2019  HPI/Events of Note  PO4  2.2  eICU Interventions  ELINK electrolyte replacement protocol for phosphorus ordered.        Frederik Pear 03/27/2019, 8:28 PM

## 2019-03-28 ENCOUNTER — Inpatient Hospital Stay (HOSPITAL_COMMUNITY): Payer: Medicare HMO

## 2019-03-28 DIAGNOSIS — J9602 Acute respiratory failure with hypercapnia: Secondary | ICD-10-CM

## 2019-03-28 LAB — BASIC METABOLIC PANEL
Anion gap: 7 (ref 5–15)
BUN: 24 mg/dL — ABNORMAL HIGH (ref 8–23)
CO2: 28 mmol/L (ref 22–32)
Calcium: 7.8 mg/dL — ABNORMAL LOW (ref 8.9–10.3)
Chloride: 114 mmol/L — ABNORMAL HIGH (ref 98–111)
Creatinine, Ser: 0.77 mg/dL (ref 0.44–1.00)
GFR calc Af Amer: >60 mL/min (ref >60)
GFR calc non Af Amer: >60 mL/min (ref >60)
Glucose, Bld: 130 mg/dL — ABNORMAL HIGH (ref 70–99)
Potassium: 4 mmol/L (ref 3.5–5.1)
Sodium: 149 mmol/L — ABNORMAL HIGH (ref 135–145)

## 2019-03-28 LAB — GLUCOSE, CAPILLARY
Glucose-Capillary: 102 mg/dL — ABNORMAL HIGH (ref 70–99)
Glucose-Capillary: 83 mg/dL (ref 70–99)
Glucose-Capillary: 122 mg/dL — ABNORMAL HIGH (ref 70–99)
Glucose-Capillary: 120 mg/dL — ABNORMAL HIGH (ref 70–99)
Glucose-Capillary: 107 mg/dL — ABNORMAL HIGH (ref 70–99)
Glucose-Capillary: 113 mg/dL — ABNORMAL HIGH (ref 70–99)

## 2019-03-28 LAB — CBC
HCT: 28.9 % — ABNORMAL LOW (ref 36.0–46.0)
Hemoglobin: 8.4 g/dL — ABNORMAL LOW (ref 12.0–15.0)
MCH: 26.3 pg (ref 26.0–34.0)
MCHC: 29.1 g/dL — ABNORMAL LOW (ref 30.0–36.0)
MCV: 90.3 fL (ref 80.0–100.0)
Platelets: 449 10*3/uL — ABNORMAL HIGH (ref 150–400)
RBC: 3.2 MIL/uL — ABNORMAL LOW (ref 3.87–5.11)
RDW: 15.6 % — ABNORMAL HIGH (ref 11.5–15.5)
WBC: 8.9 10*3/uL (ref 4.0–10.5)
nRBC: 0.2 % (ref 0.0–0.2)

## 2019-03-28 LAB — MAGNESIUM
Magnesium: 2 mg/dL (ref 1.7–2.4)
Magnesium: 2 mg/dL (ref 1.7–2.4)

## 2019-03-28 LAB — HEPARIN LEVEL (UNFRACTIONATED)
Heparin Unfractionated: 0.27 IU/mL — ABNORMAL LOW (ref 0.30–0.70)
Heparin Unfractionated: 0.27 IU/mL — ABNORMAL LOW (ref 0.30–0.70)

## 2019-03-28 LAB — PHOSPHORUS
Phosphorus: 2.4 mg/dL — ABNORMAL LOW (ref 2.5–4.6)
Phosphorus: 2.2 mg/dL — ABNORMAL LOW (ref 2.5–4.6)

## 2019-03-28 MED ORDER — FREE WATER
100.0000 mL | Status: DC
  Administered 2019-03-28 – 2019-03-29 (×9): 100 mL

## 2019-03-28 MED ORDER — FUROSEMIDE 10 MG/ML IJ SOLN
40.0000 mg | Freq: Once | INTRAMUSCULAR | Status: AC
  Administered 2019-03-28: 10:00:00 40 mg via INTRAVENOUS
  Filled 2019-03-28: qty 4

## 2019-03-28 MED ORDER — MIDAZOLAM HCL 2 MG/2ML IJ SOLN
2.0000 mg | INTRAMUSCULAR | Status: DC | PRN
  Administered 2019-03-28 – 2019-03-29 (×8): 2 mg via INTRAVENOUS
  Filled 2019-03-28 (×8): qty 2

## 2019-03-28 MED ORDER — HEPARIN (PORCINE) 25000 UT/250ML-% IV SOLN
1200.0000 [IU]/h | INTRAVENOUS | Status: DC
  Administered 2019-03-28: 21:00:00 1150 [IU]/h via INTRAVENOUS

## 2019-03-28 MED ORDER — ALPRAZOLAM 0.5 MG PO TABS
0.5000 mg | ORAL_TABLET | Freq: Two times a day (BID) | ORAL | Status: DC
  Administered 2019-03-28 – 2019-03-29 (×3): 0.5 mg via ORAL
  Filled 2019-03-28 (×3): qty 1

## 2019-03-28 NOTE — Progress Notes (Signed)
Pt has only voided 172ml, NP made aware. Free water flushes added every 4 hours. Bladder scan shows 340mls. Pt nods head yes when asked if she needs to void. Pt has external catheter and told to void if she feels she can. Will continue to monitor.

## 2019-03-28 NOTE — Progress Notes (Signed)
NAME:  Beverly Tucker, MRN:  [id number], DOB:  1940-02-11, LOS: 9 ADMISSION DATE:  03/15/2019, CONSULTATION DATE:  03/24/2019 REFERRING MD:  EDP, CHIEF COMPLAINT: Acute respiratory  Brief History   79 year old with breast cancer, chronic diastolic heart failure, A. fib, CKD stage III, malignant pleural effusion presenting with hypercarbic respiratory failure, dyspnea.  Has bilateral effusions with Pleurx on the left placed by IR on 11/08/2018 with weekly drainage. Per family she gets about 400 cc of drainage each time with decreasing output over the last few weeks. Last drainage was 1 week pta. Pleurx drainage was attempted in the ED 2/17 with minimal output (10-15cc).  She was intubated in the ED for altered mental status and PCCM called for admission  Past Medical History    has a past medical history of Anemia, Cancer ([place]), Chronic diastolic CHF (congestive heart failure) ([place]), CKD (chronic kidney disease), stage III, First degree AV block, GERD (gastroesophageal reflux disease), Hypertension, Malignant pleural effusion, Metastatic breast cancer ([place]), PAF (paroxysmal atrial fibrillation) ([place]), PSVT (paroxysmal supraventricular tachycardia) ([place]), and Tachy-brady syndrome ([place]).   Breast cancer diagnosed in September 2020.  Currently on letrozole therapy  Significant Hospital Events   2/17- Admit 2/18- Thoracentesis 2/19- TPA in left pleurex with removal of 150cc fluid 2/20- On norepi for hypotension, Right pleurex placed  Consults:  PCCM  Procedures:  ETT 2/17 >> Lt IJ CVL 2/17 >> Right Side Thoracentesis-  2/18 > 1000 ml of pink tinged fluid obtained  Rt pleureX- 2/20  Significant Diagnostic Tests:  CT chest 03/13/2019-no pulmonary embolism, increase in size of bilateral pleural effusions with right middle lobe airspace density.  I have reviewed the images personally. Pleural fluid 2/18-LDH 114, total protein. Cytology-adenocarcinoma Echo 2/20-LVEF 45-03%, grade 1 diastolic  dysfunction, mild RV enlargement  Micro Data:  Blood 2/17 > 1/2 coag neg staph Sputum 2/20 > negative  Urine 2/17 > negative Pleural culture 2/18 > negative Urine strep pneumo, Legionella 2/17 > negative Blood 2/21 >   Antimicrobials:  Ceftriaxone 2/18 >2/24 Azithromycin 2/18 >2/23 Vanco 2/19 >2/23  Interim history/subjective:  Sitting up in bed mildly anxious, able to utilize written communication.   Scheduled Meds: . amiodarone  200 mg Per NG tube Daily  . chlorhexidine gluconate (MEDLINE KIT)  15 mL Mouth Rinse BID  . Chlorhexidine Gluconate Cloth  6 each Topical Daily  . famotidine  20 mg Per Tube Daily  . letrozole  2.5 mg Oral Daily  . mouth rinse  15 mL Mouth Rinse 10 times per day  . sodium chloride flush  10-40 mL Intracatheter Q12H   Continuous Infusions: . sodium chloride Stopped (03/27/19 1135)  . feeding supplement (VITAL HIGH PROTEIN) Stopped (03/28/19 0800)  . fentaNYL infusion INTRAVENOUS 250 mcg/hr (03/28/19 0800)  . heparin 950 Units/hr (03/28/19 0800)  . phenylephrine (NEO-SYNEPHRINE) Adult infusion Stopped (03/27/19 2222)   PRN Meds:.acetaminophen (TYLENOL) oral liquid 160 mg/5 mL, fentaNYL, lip balm, metoprolol tartrate, midazolam, sodium chloride flush   Objective   Blood pressure (!) 120/53, pulse 98, temperature (!) 100.6 F (38.1 C), temperature source Oral, resp. rate 20, height '5\' 5"'  (1.651 m), weight 75.4 kg, SpO2 97 %. CVP:  [4 mmHg-14 mmHg] 4 mmHg  Vent Mode: PRVC FiO2 (%):  [30 %] 30 % Set Rate:  [20 bmp] 20 bmp Vt Set:  [38 mL-380 mL] 38 mL PEEP:  [5 cmH20] 5 cmH20 Plateau Pressure:  [18 cmH20-24 cmH20] 24 cmH20   Intake/Output Summary (Last 24 hours) at 03/28/2019 [phone]  Last data filed at 03/28/2019 0800 Gross per 24 hour  Intake 4528.92 ml  Output 1000 ml  Net 3528.92 ml   Filed Weights   03/26/19 0404 03/27/19 0447 03/28/19 0613  Weight: 77.8 kg 75.6 kg 75.4 kg    Examination: General: Chronically ill appearing elderly  deconditioned female on mechanical ventilation, in NAD HEENT: ETT, MM pink/moist, PERRL,  Neuro: Alert and interactive, comminuted with written communication, appears oriented   CV: s1s2 regular rate and rhythm, no murmur, rubs, or gallops,  PULM:  Clear to ascultation bilateral, no increased work of breathing, oxygen saturations  98-100 on 30 FiO2  GI: soft, bowel sounds active in all 4 quadrants, non-tender, non-distended, tolerating TF Extremities: warm/dry, no edema  Skin: no rashes or lesions  Resolved Hospital Problem list   Hypotension requiring pressors  -Pressor support stopped 2/25  Assessment & Plan:   Acute hypercarbic and hypoxic respiratory failure requiring re-intubation  - secondary to bilateral malignant pleural effusions, CHF, suspected CAP -Extubated 2/24 AM. Reintubated 2/24 PM.  CAP , s/p abx course  -Patient failed extubation 2/24. Realistic pathways include continuing optimizing efforts over the next few days with 1-way extubation vs tracheostomy. I recommend 1-way extubation given severity of medical comorbidities. Plan  Continue ventilator support with lung protective strategies  SBT trial today, possible extubation if tolerates SBT Manage anxiety  Wean PEEP and FiO2 for sats greater than 90%. Head of bed elevated 30 degrees. Follow intermittent chest x-ray and ABG.   Ensure adequate pulmonary hygiene  Follow cultures  VAP bundle in place  PAD protocol CPT as tolerated  Continue intermittent drainage of left PleurX cath   Hx Paroxysmal atrial fibrillation -has been sinus/sinus brady on precedex  Plan  Continue Amiodarone and Heparin drip  Rate controlled   Acute on chronic diastolic heart failure -ECHO on 2/20 with EF 70-75, G1DD Plan  Lasix dose x1 today, revaluate diuretics needs daily  Daily weight Strict intake ans output  Supportive care   Chronic Kidney Disease Stage 3 -Baseline creatinine 0.8-0.9, creatinine on admission 1.07 -Back to  baseline 2/26 Plan: Follow renal function / urine output Trend Bmet Avoid nephrotoxins, ensure adequate renal perfusion   Hypokalemia, mild  Plan Trend Bmet Replace as needed   Metastatic breast cancer -Holding Letrozole as this medication cannot be crushed/given per tube.  Plan  Supportive care  Goals of Care: Several lengthy discussion held with both patietn and son regarding GOC to regarding  critical/chronic illness course. Discussed that unfortunately while a vent may help small problems like hypoxia and hypercarbia, it does not fix the underlying causes of respiratory failure (dibility, diaphragmatic weakness, deconditioning, persistent effusions due to metastatic disease) and in fact will likely perpetuate these issues. We discussed various options: 1) liberation from ventilator for intentional comfort care, 2) attempted optimization with 1-way extubation, understanding that if extubation is not tolerated we would transition to comfort care 3) consideration of measures such as tracheostomy and likely ongoing ventilator support. Our team has reccommended  medical optimization for short period of time with 1-way extubation. PMT engaged 2/25. Will continue to discuss [place] decisions with all parties involved   Best practice:  Diet: EN per RDN Pain/Anxiety/Delirium protocol (if indicated): Precedex, PRN fentanyl  VAP protocol (if indicated) yes DVT prophylaxis: Heparin gtt GI prophylaxis: Pepcid Glucose control: Monitor Mobility: Bed Code Status: Full Family Communication: Attempted call 2/26 with no answer will attempt again later  Disposition: ICU  Critical care time:    CRITICAL  CARE Performed by: Johnsie Cancel   Total critical care time: 48 minutes  Critical care time was exclusive of separately billable procedures and treating other patients. Critical care was necessary to treat or prevent imminent or life-threatening deterioration.  Critical care was time spent  personally by me on the following activities: development of treatment plan with patient and/or surrogate as well as nursing, discussions with consultants, evaluation of patient's response to treatment, examination of patient, obtaining history from patient or surrogate, ordering and performing treatments and interventions, ordering and review of laboratory studies, ordering and review of radiographic studies, pulse oximetry and re-evaluation of patient's condition.  Johnsie Cancel, NP-C [facility] Contact / Pager information can be found on Amion  03/28/2019, 9:02 AM

## 2019-03-28 NOTE — Progress Notes (Signed)
PT Cancellation Note  Patient Details Name: Beverly Tucker MRN: [id number] DOB: [birth year]   Cancelled Treatment:    Reason Eval/Treat Not Completed: Medical issues which prohibited therapy, too restless per RN after attempted weaning. Will check back on 3/`1/21   Claretha Cooper 03/28/2019, 9:40 AM  [place] Pager [phone] Office [phone]

## 2019-03-28 NOTE — Progress Notes (Signed)
OT Cancellation Note  Patient Details Name: Beverly Tucker MRN: [id number] DOB: 1940/07/03   Cancelled Treatment:    Reason Eval/Treat Not Completed: Patient not medically ready(too restless per RN after attempted weaning.) Will follow up when appropriate.   Ramond Dial, OT/L   Acute OT Clinical Specialist Acute Rehabilitation Services Pager [phone] Office [phone]  03/28/2019, 10:32 AM

## 2019-03-28 NOTE — Progress Notes (Signed)
PCCM progress note  Patient son Beverly Tucker updated at bedside this evening regarding patient's prognosis and goals of care.  Patient was alert and able to communicate with written communication during discussion.  Patient's son and myself communicated to patient poor prognosis given extensive comorbidities including metastatic lung cancer and likely inability to liberate from ventilator.  Both patient and son voiced understanding and agreement of plan for one-way extubation with likely transition to comfort measures.  Both son and patient verbalized that patient would not want prolonged life support including tracheostomy.  Patient son requested his brother be present at bedside prior to extubation. Tentative plan for family meeting tomorrow afternoon at which time one-way extubation will be performed. Patient son will contact and communicate with other family members to determine time. Palliative medicine team has been updated regarding plan and will be in contact and available for transition of care tomorrow.  Johnsie Cancel, NP-C [facility] Contact / Pager information can be found on Amion  03/28/2019, 6:52 PM

## 2019-03-28 NOTE — Progress Notes (Signed)
ANTICOAGULATION CONSULT NOTE - Follow Up Consult  Pharmacy Consult for Heparin Indication: hx atrial fibrillation (home Eliquis on hold)  No Known Allergies  Patient Measurements: Height: 5\' 5"  (165.1 cm) Weight: 166 lb 10.7 oz (75.6 kg) IBW/kg (Calculated) : 57 Heparin Dosing Weight:   Vital Signs: Temp: 99.2 F (37.3 C) (02/25 2300) Temp Source: Axillary (02/25 2300) BP: 122/58 (02/26 0000) Pulse Rate: 67 (02/26 0000)  Labs: Recent Labs    03/26/19 0440 03/26/19 0440 03/26/19 0908 03/26/19 1922 03/26/19 1922 03/27/19 0426 03/27/19 0606 03/27/19 1400 03/27/19 2224  HGB 8.6*   < > 8.6*  --   --  8.7*  --   --   --   HCT 29.8*  --  29.8*  --   --  30.7*  --   --   --   PLT 394  --  364  --   --  535*  --   --   --   HEPARINUNFRC  --   --   --  0.73*   < >  --  0.37 0.15* 0.34  CREATININE 0.83  --   --  1.07*  --  0.71  --   --   --    < > = values in this interval not displayed.    Estimated Creatinine Clearance: 58 mL/min (by C-G formula based on SCr of 0.71 mg/dL).   Medications:  Infusions:  . sodium chloride Stopped (03/27/19 1135)  . feeding supplement (VITAL HIGH PROTEIN) 40 mL/hr at 03/27/19 2221  . fentaNYL infusion INTRAVENOUS 225 mcg/hr (03/28/19 0000)  . heparin 950 Units/hr (03/28/19 0000)  . phenylephrine (NEO-SYNEPHRINE) Adult infusion Stopped (03/27/19 2222)  . potassium PHOSPHATE IVPB (in mmol) 42 mL/hr at 03/28/19 0000    Assessment: Patient with heparin level at goal.  No heparin issues noted.  Goal of Therapy:  Heparin level 0.3-0.7 units/ml Monitor platelets by anticoagulation protocol: Yes   Plan:  Continue heparin drip at current rate Recheck level at 0800  Tyler Deis, [place] 03/28/2019,3:02 AM

## 2019-03-28 NOTE — Progress Notes (Addendum)
Beverly Tucker for heparin Indication: hx atrial fibrillation (home Eliquis on hold)  No Known Allergies  Patient Measurements: Height: 5\' 5"  (165.1 cm) Weight: 166 lb 3.6 oz (75.4 kg) IBW/kg (Calculated) : 57 Heparin Dosing Weight: 73 kg  Vital Signs: Temp: 98.8 F (37.1 C) (02/26 1600) Temp Source: Oral (02/26 1600) BP: 113/42 (02/26 1800) Pulse Rate: 60 (02/26 1800)  Labs: Recent Labs    03/26/19 0440 03/26/19 0908 03/26/19 0908 03/26/19 1922 03/27/19 0426 03/27/19 0606 03/27/19 2224 03/28/19 0355 03/28/19 0821 03/28/19 1754  HGB  --  8.6*   < >  --  8.7*  --   --  8.4*  --   --   HCT  --  29.8*  --   --  30.7*  --   --  28.9*  --   --   PLT  --  364  --   --  535*  --   --  449*  --   --   HEPARINUNFRC  --   --   --  0.73*  --    < > 0.34  --  0.27* 0.27*  CREATININE   < >  --   --  1.07* 0.71  --   --  0.77  --   --    < > = values in this interval not displayed.    Estimated Creatinine Clearance: 58 mL/min (by C-G formula based on SCr of 0.77 mg/dL).   Medications:  - on Eliquis 5 mg BID PTA (last dose taken on 03/05/2019)  Assessment: Patient's a 79 y.o F with hx breast cancer, malignant recurrent pleural effusions (left pleurx placed by IR on 11/08/2018), PSVT and PAF (CHADSVASC 4) on Eliquis PTA, presented to the ED on 2/17 with SOB.  She was intubated on admission, s/p right thoracentesis on 2/18, tPA dwell in pleurx cath on 2/19, and right pleurx placed on 2/20.  Pt's home eliquis was held on admission with heparin SQ started for VTE prophylaxis. Per CCM's request, start IV heparin infusion on 2/24.  Today, 03/28/2019: - PM heparin level remains slightly sub-therapeutic at 0.27 units/mL. - No infusion or bleeding issues noted per nursing. No blood noted in pleurx drain. - Hgb low but stable. Pltc elevated.   Goal of Therapy:  Heparin level 0.3-0.7 units/ml Monitor platelets by anticoagulation protocol: Yes   Plan:  -  increase heparin infusion to 1150 units/hr  - daily CBC, heparin level  - monitor for s/sx of bleeding   Lindell Spar, PharmD, BCPS Clinical Pharmacist  03/28/2019,7:24 PM

## 2019-03-28 NOTE — Progress Notes (Signed)
[place] for heparin Indication: hx atrial fibrillation (home Eliquis on hold)  No Known Allergies  Patient Measurements: Height: 5\' 5"  (165.1 cm) Weight: 166 lb 3.6 oz (75.4 kg) IBW/kg (Calculated) : 57 Heparin Dosing Weight: 73 kg  Vital Signs: Temp: 100.6 F (38.1 C) (02/26 0800) Temp Source: Oral (02/26 0800) BP: 120/53 (02/26 0835) Pulse Rate: 97 (02/26 0835)  Labs: Recent Labs    03/26/19 0440 03/26/19 0908 03/26/19 0908 03/26/19 1922 03/27/19 0426 03/27/19 0606 03/27/19 1400 03/27/19 2224 03/28/19 0355 03/28/19 0821  HGB  --  8.6*   < >  --  8.7*  --   --   --  8.4*  --   HCT  --  29.8*  --   --  30.7*  --   --   --  28.9*  --   PLT  --  364  --   --  535*  --   --   --  449*  --   HEPARINUNFRC  --   --   --  0.73*  --    < > 0.15* 0.34  --  0.27*  CREATININE   < >  --   --  1.07* 0.71  --   --   --  0.77  --    < > = values in this interval not displayed.    Estimated Creatinine Clearance: 58 mL/min (by C-G formula based on SCr of 0.77 mg/dL).   Medications:  - on Eliquis 5 mg bid PTA (last dose taken on 03/28/2019)  Assessment: Patient's a 79 y.o F with hx breast cancer, malignant recurrent pleural effusions (left pleurx placed by IR on 11/08/2018), PSVT and PAF (CHADSVASC 4) on Eliquis PTA, presented to the ED on 2/17 with SOB.  She was intubated on admission, s/p right thoracentesis on 2/18, tPA dwell in pleurx cath on 2/19, and right pleurx placed on 2/20.  Pt's home eliquis was held on admission with heparin SQ started for VTE prophylaxis. Per CCM's request, start heparin on 2/24.  Today, 03/28/2019: - heparin level is sub-therapeutic at 0.27. Per pt's RN, level drawn at appropriate site. IV line running ok. No bleeding noted. No blood noted in pleurx drain - hgb low but stable, plts high  Goal of Therapy:  Heparin level 0.3-0.7 units/ml Monitor platelets by anticoagulation protocol: Yes   Plan:  - increase  heparin drip to 1050 units/hr  - check 8 hr heparin level - monitor for severity of bleeding with pleurx drain  Netta Cedars PharmD, BCPS 03/28/2019,9:14 AM

## 2019-03-28 NOTE — Progress Notes (Signed)
In and out cath done, 658ml urine drained. NP Rosana Hoes aware

## 2019-03-29 DIAGNOSIS — Z515 Encounter for palliative care: Secondary | ICD-10-CM

## 2019-03-29 LAB — GLUCOSE, CAPILLARY
Glucose-Capillary: 113 mg/dL — ABNORMAL HIGH (ref 70–99)
Glucose-Capillary: 106 mg/dL — ABNORMAL HIGH (ref 70–99)
Glucose-Capillary: 101 mg/dL — ABNORMAL HIGH (ref 70–99)
Glucose-Capillary: 110 mg/dL — ABNORMAL HIGH (ref 70–99)

## 2019-03-29 LAB — CBC
HCT: 25.4 % — ABNORMAL LOW (ref 36.0–46.0)
Hemoglobin: 7.2 g/dL — ABNORMAL LOW (ref 12.0–15.0)
MCH: 25.5 pg — ABNORMAL LOW (ref 26.0–34.0)
MCHC: 28.3 g/dL — ABNORMAL LOW (ref 30.0–36.0)
MCV: 90.1 fL (ref 80.0–100.0)
Platelets: 386 10*3/uL (ref 150–400)
RBC: 2.82 MIL/uL — ABNORMAL LOW (ref 3.87–5.11)
RDW: 15.9 % — ABNORMAL HIGH (ref 11.5–15.5)
WBC: 5.8 10*3/uL (ref 4.0–10.5)
nRBC: 0.5 % — ABNORMAL HIGH (ref 0.0–0.2)

## 2019-03-29 LAB — HEPARIN LEVEL (UNFRACTIONATED)
Heparin Unfractionated: 0.34 IU/mL (ref 0.30–0.70)
Heparin Unfractionated: 0.28 IU/mL — ABNORMAL LOW (ref 0.30–0.70)

## 2019-03-29 MED ORDER — MIDAZOLAM HCL 2 MG/2ML IJ SOLN
1.0000 mg | INTRAMUSCULAR | Status: DC | PRN
  Administered 2019-03-29: 18:00:00 2 mg via INTRAVENOUS
  Filled 2019-03-29 (×2): qty 2

## 2019-03-29 MED ORDER — MORPHINE SULFATE (PF) 2 MG/ML IV SOLN
2.0000 mg | INTRAVENOUS | Status: DC | PRN
  Administered 2019-03-29: 4 mg via INTRAVENOUS
  Administered 2019-03-29: 2 mg via INTRAVENOUS
  Filled 2019-03-29: qty 2
  Filled 2019-03-29: qty 1

## 2019-03-29 MED ORDER — MIDAZOLAM HCL 2 MG/2ML IJ SOLN
1.0000 mg | INTRAMUSCULAR | Status: DC | PRN
  Administered 2019-03-29: 2 mg via INTRAVENOUS
  Filled 2019-03-29: qty 2

## 2019-03-29 MED ORDER — MORPHINE SULFATE (PF) 4 MG/ML IV SOLN
4.0000 mg | INTRAVENOUS | Status: DC | PRN
  Administered 2019-03-29: 8 mg via INTRAVENOUS
  Administered 2019-03-29: 4 mg via INTRAVENOUS
  Filled 2019-03-29: qty 1
  Filled 2019-03-29: qty 2

## 2019-03-29 MED ORDER — ALPRAZOLAM 1 MG PO TABS
1.0000 mg | ORAL_TABLET | Freq: Four times a day (QID) | ORAL | Status: DC
  Administered 2019-03-29: 14:00:00 1 mg via ORAL
  Filled 2019-03-29: qty 1

## 2019-03-30 LAB — GLUCOSE, CAPILLARY: Glucose-Capillary: 109 mg/dL — ABNORMAL HIGH (ref 70–99)

## 2019-03-31 NOTE — Consult Note (Signed)
Palliative care consult note  Reason for consult: Goals of care in light of respiratory failure with metastatic breast cancer  Palliative care consult received.  Chart reviewed including personal review of pertinent labs and imaging.  Briefly, [name] is a 79 year old female with past medical history of breast cancer, chronic diastolic heart failure, A. fib, CKD stage III, malignant pleural effusion who is admitted with hypercarbic respiratory failure.  She has bilateral pleural effusions and had Pleurx placed as an outpatient.  On admission, she was intubated in the ED and subsequently intubated on 2/24.  She needs to be reintubated short time later.  Family met with Merlene Laughter from [facility] and plan was made for medical maximization one-way extubation today without plan for reintubation if she decompensates.  Palliative consulted for goals of care and family support.  I met today with [name].  She is awake and alert on the vent.  I also met with her family outside the room in conjunction with [place], Agricultural consultant.  Meeting included her 2 sons, granddaughter, and daughter-in-law.  We discussed her clinical course and plan moving forward for one-way extubation.  Family understands that this may be a terminal event if she decompensates, however, she has been clear in desire not to pursue interventions such as tracheostomy and therefore we will plan for medical maximization followed by one-way extubation.  It is very clear in discussion with family that her comfort is top priority moving forward and if she decompensates (which we discussed is very likely in the next few hours) focus of her care will shift to full comfort.  She has tolerance and for benzodiazepines and has been receiving them regularly this admission.  She appeared anxious during evaluation for extubation and requested she get a dose of Versed.  She seems sleepy after this and therefore decision was made to allow this to wear off some  prior to extubating her as she is not full comfort at this time and family wants to see how she does allowing extubation.  -One-way extubation later today. -While she is not currently full comfort, plan will be to transition to comfort if she decompensates following extubation.  There is no plan for reintubation. -We will continue to follow.  Time: 1220-1340 Total time: 80 minutes  Greater than 50%  of this time was spent counseling and coordinating care related to the above assessment and plan.  Micheline Rough, MD [place] Team [phone]

## 2019-03-31 NOTE — Progress Notes (Signed)
ANTICOAGULATION CONSULT NOTE   Pharmacy Consult for heparin Indication: hx atrial fibrillation (home Eliquis on hold)  No Known Allergies  Patient Measurements: Height: 5\' 5"  (165.1 cm) Weight: 169 lb 15.6 oz (77.1 kg) IBW/kg (Calculated) : 57 Heparin Dosing Weight: 73 kg  Vital Signs: Temp: 99.4 F (37.4 C) (02/27 1200) Temp Source: Oral (02/27 1200) BP: 105/38 (02/27 1200) Pulse Rate: 59 (02/27 1200)  Labs: Recent Labs    03/26/19 1922 03/27/19 0426 03/27/19 0606 03/28/19 0355 03/28/19 0821 03/28/19 1754 April 03, 2019 0526 April 03, 2019 1400  HGB  --  8.7*   < > 8.4*  --   --  7.2*  --   HCT  --  30.7*  --  28.9*  --   --  25.4*  --   PLT  --  535*  --  449*  --   --  386  --   HEPARINUNFRC 0.73*  --    < >  --    < > 0.27* 0.34 0.28*  CREATININE 1.07* 0.71  --  0.77  --   --   --   --    < > = values in this interval not displayed.    Estimated Creatinine Clearance: 58.5 mL/min (by C-G formula based on SCr of 0.77 mg/dL).   Medications:  - on Eliquis 5 mg BID PTA (last dose taken on 03/25/2019)  Assessment: Patient's a 79 y.o F with hx breast cancer, malignant recurrent pleural effusions (left pleurx placed by IR on 11/08/2018), PSVT and PAF (CHADSVASC 4) on Eliquis PTA, presented to the ED on 2/17 with SOB.  She was intubated on admission, s/p right thoracentesis on 2/18, tPA dwell in pleurx cath on 2/19, and right pleurx placed on 2/20.  Pt's home eliquis was held on admission with heparin SQ started for VTE prophylaxis. Per CCM's request, start IV heparin infusion on 2/24.  Today, 2019-04-03: - PM heparin level remains slightly sub-therapeutic at 0.28 units/mL. - Hgb this am was low at 7.2, Plts WNL - per d/w RN no bleeding or other issues noted   Goal of Therapy:  Heparin level 0.3-0.7 units/ml Monitor platelets by anticoagulation protocol: Yes   Plan:  - increase heparin infusion to 1200 units/hr  - Heparin level in 8 hours - daily CBC, heparin level  - monitor  for s/sx of bleeding   Dolly Rias RPh 04-03-2019, 3:43 PM

## 2019-03-31 NOTE — Procedures (Signed)
Extubation Procedure Note  Patient Details:   Name: Beverly Tucker DOB: 1940-12-16 MRN: [id number]   Airway Documentation:    Vent end date: 03/26/19 Vent end time: 1029   Evaluation  O2 sats: stable throughout Complications: No apparent complications Patient did tolerate procedure well. Bilateral Breath Sounds: Diminished   Yes    Lamonte Sakai 2019/04/12, 5:35 PM

## 2019-03-31 NOTE — Progress Notes (Signed)
Family Update:  The family has been in the hospital to visit the patient as she has been explained to the family to have a poor prognosis. Palliative care is on board, but the patient is not a full comfort measure at this time, and the POC remains for the patient to be a DNR and DNI. We were due to extubate at around 1400, but the patient was not able to support her own respirations when changing the vent settings so the goal is to get her to wake up more before we take her off. The family has been told this and is understanding of the patient's current condition and plan of care.

## 2019-03-31 NOTE — Progress Notes (Addendum)
RN at bedside, asystole on monitor, no heart sounds asuculated, spontaneous breaths not present, or no pulses palpable. Confirmed with Orlin Hilding, RN. E-Link notified. Family notified.

## 2019-03-31 NOTE — Progress Notes (Signed)
OT Cancellation Note/Discharge  Patient Details Name: Beverly Tucker MRN: [id number] DOB: 1940/05/26   Cancelled Treatment:    Reason Eval/Treat Not Completed: Patient not medically ready. Patient discharged from OT services secondary to medical decline - to be extubated/comfort care.   Rage Beever OTR/L   Marrion Finan 04-28-19, 1:00 PM

## 2019-03-31 NOTE — Progress Notes (Signed)
Physical Therapy Discharge Patient Details Name: GILIANA NORBERG MRN: [id number] DOB: [birth year] Today's Date: 09-Apr-2019 Time:  -     Patient discharged from PT services secondary to medical decline - to be extubated/comfort care.  GP     Claretha Cooper 09-Apr-2019, 9:39 AM [place] Pager [phone] Office [phone]

## 2019-03-31 NOTE — Progress Notes (Signed)
Pt is asleep, resting comfortably, cpt not done at this time.

## 2019-03-31 NOTE — Progress Notes (Signed)
I reevaluated [name] in conjunction with bedside RN and respiratory therapy.  She appears to be awake again following some sleepiness after she was given Versed in preparation for extubation.  I called and let her family know that she is at a point where I believe she is medically maximized for one-way extubation.  They are going to travel back to the hospital as I also let them know that at this point she does not appear to be breathing much above the vent and she could decompensate quickly once life support is removed.  -Plan for one-way extubation.  No plan for reintubation and will transition to full comfort if she decompensates.  Family understands this is likely to happen over the next few hours.  Micheline Rough, MD [facility] Team [phone]  NO CHARGE NOTE

## 2019-03-31 NOTE — Progress Notes (Signed)
I checked in on Beverly Tucker immediately following extubation.    She was in significant distress with increased work of breathing, clutching bedrail, and face turning reddish/purple.   Stayed at bedside while RN team administered additional morphine and versed.  I asked to restart fentanyl infusion as well.  She appeared much more comfortable following interventions.  Appreciate RN/RT team greatly.  Goal moving forward is comfort.  Continue to be aggressive with comfort interventions.  Micheline Rough, MD [place] Team [phone]

## 2019-03-31 NOTE — Progress Notes (Signed)
Patient remains too sleepy to extubate at this time. Patient is NOT full comfort at this time. Will extubate patient once she is more awake.

## 2019-03-31 NOTE — Progress Notes (Signed)
NAME:  Beverly Tucker, MRN:  [id number], DOB:  08-02-1940, LOS: 69 ADMISSION DATE:  03/23/2019, CONSULTATION DATE:  03/11/2019 REFERRING MD:  EDP, CHIEF COMPLAINT: Acute respiratory  Brief History   79 year old with breast cancer, chronic diastolic heart failure, A. fib, CKD stage III, malignant pleural effusion presenting with hypercarbic respiratory failure, dyspnea.  Has bilateral effusions with Pleurx on the left placed by IR on 11/08/2018 with weekly drainage. Per family she gets about 400 cc of drainage each time with decreasing output over the last few weeks. Last drainage was 1 week pta. Pleurx drainage was attempted in the ED 2/17 with minimal output (10-15cc).  She was intubated in the ED for altered mental status and PCCM called for admission  Past Medical History    has a past medical history of Anemia, Cancer ([place]), Chronic diastolic CHF (congestive heart failure) ([place]), CKD (chronic kidney disease), stage III, First degree AV block, GERD (gastroesophageal reflux disease), Hypertension, Malignant pleural effusion, Metastatic breast cancer ([place]), PAF (paroxysmal atrial fibrillation) ([place]), PSVT (paroxysmal supraventricular tachycardia) ([place]), and Tachy-brady syndrome ([place]).   Breast cancer diagnosed in September 2020.  Currently on letrozole therapy  Significant Hospital Events   2/17- Admit 2/18- Thoracentesis 2/19- TPA in left pleurex with removal of 150cc fluid 2/20- On norepi for hypotension, Right pleurex placed 2/26  D/c amio due to bradycardia and uncertain pulmonary effects  Consults:  PCCM  Procedures:  ETT 2/17 >> Lt IJ CVL 2/17 >> Right Side Thoracentesis-  2/18 > 1000 ml of pink tinged fluid obtained  Rt pleureX- 2/20  Significant Diagnostic Tests:  CT chest 03/21/2019-no pulmonary embolism, increase in size of bilateral pleural effusions with right middle lobe airspace density.  I have reviewed the images personally. Pleural fluid 2/18-LDH 114, total protein.  Cytology-adenocarcinoma Echo 2/20-LVEF 12-24%, grade 1 diastolic dysfunction, mild RV enlargement  Micro Data:  Blood 2/17 > 1/2 coag neg staph Sputum 2/20 > negative  Urine 2/17 > negative Pleural culture 2/18 > negative Urine strep pneumo, Legionella 2/17 > negative Blood 2/21 >   Antimicrobials:  Ceftriaxone 2/18 >2/24 Azithromycin 2/18 >2/23 Vanco 2/19 >2/23  Interim history/subjective:  Sedated at present on fentanyl drip, prn versed IV and xanax per tube  Scheduled Meds: . ALPRAZolam  0.5 mg Oral BID  . chlorhexidine gluconate (MEDLINE KIT)  15 mL Mouth Rinse BID  . Chlorhexidine Gluconate Cloth  6 each Topical Daily  . famotidine  20 mg Per Tube Daily  . free water  100 mL Per Tube Q4H  . letrozole  2.5 mg Oral Daily  . mouth rinse  15 mL Mouth Rinse 10 times per day  . sodium chloride flush  10-40 mL Intracatheter Q12H   Continuous Infusions: . sodium chloride Stopped (03/27/19 1135)  . feeding supplement (VITAL HIGH PROTEIN) 1,000 mL (03/28/19 1345)  . fentaNYL infusion INTRAVENOUS 300 mcg/hr (2019-04-21 0658)  . heparin 1,150 Units/hr (04/21/2019 0658)  . phenylephrine (NEO-SYNEPHRINE) Adult infusion Stopped (03/27/19 2222)   PRN Meds:.acetaminophen (TYLENOL) oral liquid 160 mg/5 mL, fentaNYL, lip balm, metoprolol tartrate, midazolam, sodium chloride flush   Objective   Blood pressure (!) 93/33, pulse 74, temperature (!) 97.2 F (36.2 C), temperature source Oral, resp. rate (!) 27, height '5\' 5"'  (1.651 m), weight 77.1 kg, SpO2 99 %. CVP:  [5 mmHg-22 mmHg] 10 mmHg  Vent Mode: PRVC FiO2 (%):  [30 %] 30 % Set Rate:  [20 bmp] 20 bmp Vt Set:  [380 mL] 380 mL PEEP:  [5  [place] Pressure:  [18 cmH20-22 cmH20] 22 cmH20   Intake/Output Summary (Last 24 hours) at 04/22/2019 0837 Last data filed at 2019-04-22 8938 Gross per 24 hour  Intake 1390.76 ml  Output 800 ml  Net 590.76 ml   Filed Weights   03/27/19 0447 03/28/19 0613 2019-04-22 0500  Weight: 75.6  kg 75.4 kg 77.1 kg    Examination: Pt sedated on vent with fent drip and prn versed  No jvd Oropharynx ET  Neck supple Lungs with a few scattered exp > insp rhonchi bilaterally slt irreg  no s3 or or sign murmur Abd soft/ nl excursion  Extr warm with trace edema      Resolved Hospital Problem list   Hypotension requiring pressors  -Pressor support stopped 2/25  Assessment & Plan:   Acute hypercarbic and hypoxic respiratory failure requiring re-intubation  - secondary to bilateral malignant pleural effusions, CHF, suspected CAP -Extubated 2/24 AM. Reintubated 2/24 PM.  CAP , s/p abx course  -Patient failed extubation 2/24.   - one way extubation planned for 2/27 need to make sure comfort goals met in meantime   Hx Paroxysmal atrial fibrillation -has been sinus/sinus brady on precedex  - off amio 2/26  - Rate controlled as 2/27 Plan  Heparin drip    Acute on chronic diastolic heart failure -ECHO on 2/20 with EF 70-75, G1DD Plan  Daily weight Strict intake ans output  Supportive care   Chronic Kidney Disease Stage 3 -Baseline creatinine 0.8-0.9, creatinine on admission 1.07 -Back to baseline 2/26 Plan: Follow renal function / urine output Trend Bmet Avoid nephrotoxins, ensure adequate renal perfusion   Hypokalemia, mild  Plan Trend Bmet Replace as needed   Metastatic breast cancer -Holding Letrozole as this medication cannot be crushed/given per tube.  Plan  Supportive care  Anemia ? of acute illness / no evidence active bleeding though on hep Lab Results  Component Value Date   HGB 7.2 (L) 04-22-2019   HGB 8.4 (L) 03/28/2019   HGB 8.7 (L) 03/27/2019   HGB 10.6 (L) 01/02/2019   HGB 10.1 (L) 11/13/2018   tx for hgb < 7   Goals of Care: One way wean planned for pm 2/27  In meantime increase xanax to 1 mg q 6 h as at baseline very anxious and using xanax as outpt  Best practice:  Diet: EN per RDN Pain/Anxiety/Delirium protocol (if indicated):   Fentanyl drip / prn IV versed xanax per tube VAP protocol (if indicated) yes DVT prophylaxis: Heparin gtt GI prophylaxis: Pepcid Glucose control: Monitor Mobility: Bed Code Status: Full Family Communication: see extensive phone contact 2/26  And plan to see when arrive for one way ext this pm  Disposition: ICU    The patient is critically ill with multiple organ systems failure and requires high complexity decision making for assessment and support, frequent evaluation and titration of therapies, application of advanced monitoring technologies and extensive interpretation of multiple databases. Critical Care Time devoted to patient care services described in this note is 40 minutes.    Christinia Gully, MD Pulmonary and [place] [phone] After 5:30 PM or weekends, use [facility] [phone]

## 2019-03-31 NOTE — Progress Notes (Signed)
Patient has been extubated as of 1720. The patient was able to maintain hemodynamic stability for about 10-15 minutes before she started becoming alarmingly anxious, with an increase work of breathing. The patient was given medication to help alleviate her symptoms and the Palliative care M.D. had came by to assess the situation and had placed the pt on comfort care measures with making appropriate adjustments to their medications to provide for maximum comfort. The family is understanding of the plan of care at this time and is at bedside.

## 2019-03-31 NOTE — Progress Notes (Signed)
ANTICOAGULATION CONSULT NOTE - Follow Up Consult  Pharmacy Consult for heparin  Indication: hx atrial fibrillation (home Eliquis on hold)  No Known Allergies  Patient Measurements: Height: 5\' 5"  (165.1 cm) Weight: 169 lb 15.6 oz (77.1 kg) IBW/kg (Calculated) : 57 Heparin Dosing Weight:   Vital Signs: Temp: 97.2 F (36.2 C) (02/27 0422) Temp Source: Oral (02/27 0422) BP: 101/44 (02/26 2345) Pulse Rate: 51 (02/26 2345)  Labs: Recent Labs    03/26/19 0908 03/26/19 1922 03/27/19 0426 03/27/19 0606 03/27/19 2224 03/28/19 0355 03/28/19 0821 03/28/19 1754 21-Apr-2019 0526  HGB   < >  --  8.7*   < >  --  8.4*  --   --  7.2*  HCT   < >  --  30.7*  --   --  28.9*  --   --  25.4*  PLT   < >  --  535*  --   --  449*  --   --  386  HEPARINUNFRC  --  0.73*  --    < >   < >  --  0.27* 0.27* 0.34  CREATININE  --  1.07* 0.71  --   --  0.77  --   --   --    < > = values in this interval not displayed.    Estimated Creatinine Clearance: 58.5 mL/min (by C-G formula based on SCr of 0.77 mg/dL).   Medications:  Infusions:  . sodium chloride Stopped (03/27/19 1135)  . feeding supplement (VITAL HIGH PROTEIN) 1,000 mL (03/28/19 1345)  . fentaNYL infusion INTRAVENOUS 300 mcg/hr (April 21, 2019 0344)  . heparin 1,150 Units/hr (03/28/19 2054)  . phenylephrine (NEO-SYNEPHRINE) Adult infusion Stopped (03/27/19 2222)    Assessment: Patient with heparin level at goal.  No heparin issues noted.  Goal of Therapy:  Heparin level 0.3-0.7 units/ml Monitor platelets by anticoagulation protocol: Yes   Plan:  Continue heparin drip at current rate Recheck level at [address] [place] 2019-04-21,6:42 AM

## 2019-03-31 DEATH — deceased

## 2019-04-01 LAB — CULTURE, BLOOD (ROUTINE X 2)
Culture: NO GROWTH
Culture: NO GROWTH
Special Requests: ADEQUATE
Special Requests: ADEQUATE

## 2019-04-05 NOTE — Discharge Summary (Signed)
Physician Discharge Summary         Patient ID: Beverly Tucker MRN: [id number] DOB/AGE: August 21, 1940 79 y.o.  Admit date: 03/28/2019 Discharge date: 04-26-2019  Discharge Diagnoses:   Metastatic breast cancern with bilateral malignant effusions Acute respiratory failure/ vent dep secondary to effusions and suspected CAP  Grade 1 diastolic dysfunction complicated by PAF Anemia of chronic illness      Discharge summary   79 year old with breast cancer, chronic diastolic heart failure, A. fib, CKD stage III, malignant pleural effusion presenting with hypercarbic respiratory failure, dyspnea.  Has bilateral effusions with Pleurx on the left placed by IR on 11/08/2018 with weekly drainage. Per family she gets about 400 cc of drainage each time with decreasing output over the last few weeks. Last drainage was 1 week pta. Pleurx drainage was attempted in the ED 2/17 with minimal output (10-15cc).  She was intubated in the ED for altered mental status and PCCM called for admission. Despite aggressive management of bilateral malignant  pleural effusions including Pleurex placement on the R this admit and TPA on the Left she remained vent dependent and failed on extubation attempt within 12 hours all the while insisting that she be taken off the vent.   She was seen by Palliative care (Dr Domingo Cocking) who helped with pt/ family communication in this difficult setting (covid restrictions) and worked out a terminal wean/ one way extubation  plan as full DNR and she expired with comfort measures in place.     Signed: Christinia Gully 04/05/2019, 11:54 AM

## 2019-04-08 ENCOUNTER — Telehealth: Payer: Medicare HMO | Admitting: Physician Assistant

## 2019-04-28 ENCOUNTER — Other Ambulatory Visit: Payer: Medicare HMO

## 2019-04-28 ENCOUNTER — Ambulatory Visit: Payer: Medicare HMO | Admitting: Hematology

## 2019-12-17 IMAGING — DX DG CHEST 1V PORT
1 series · 1 of 1 positions shown · non-contrast
Comparison: 11/17/2018.

CLINICAL DATA: Shortness of breath.  Weakness.  Pleural effusion.

EXAM:
PORTABLE CHEST 1 VIEW

[chest ap]
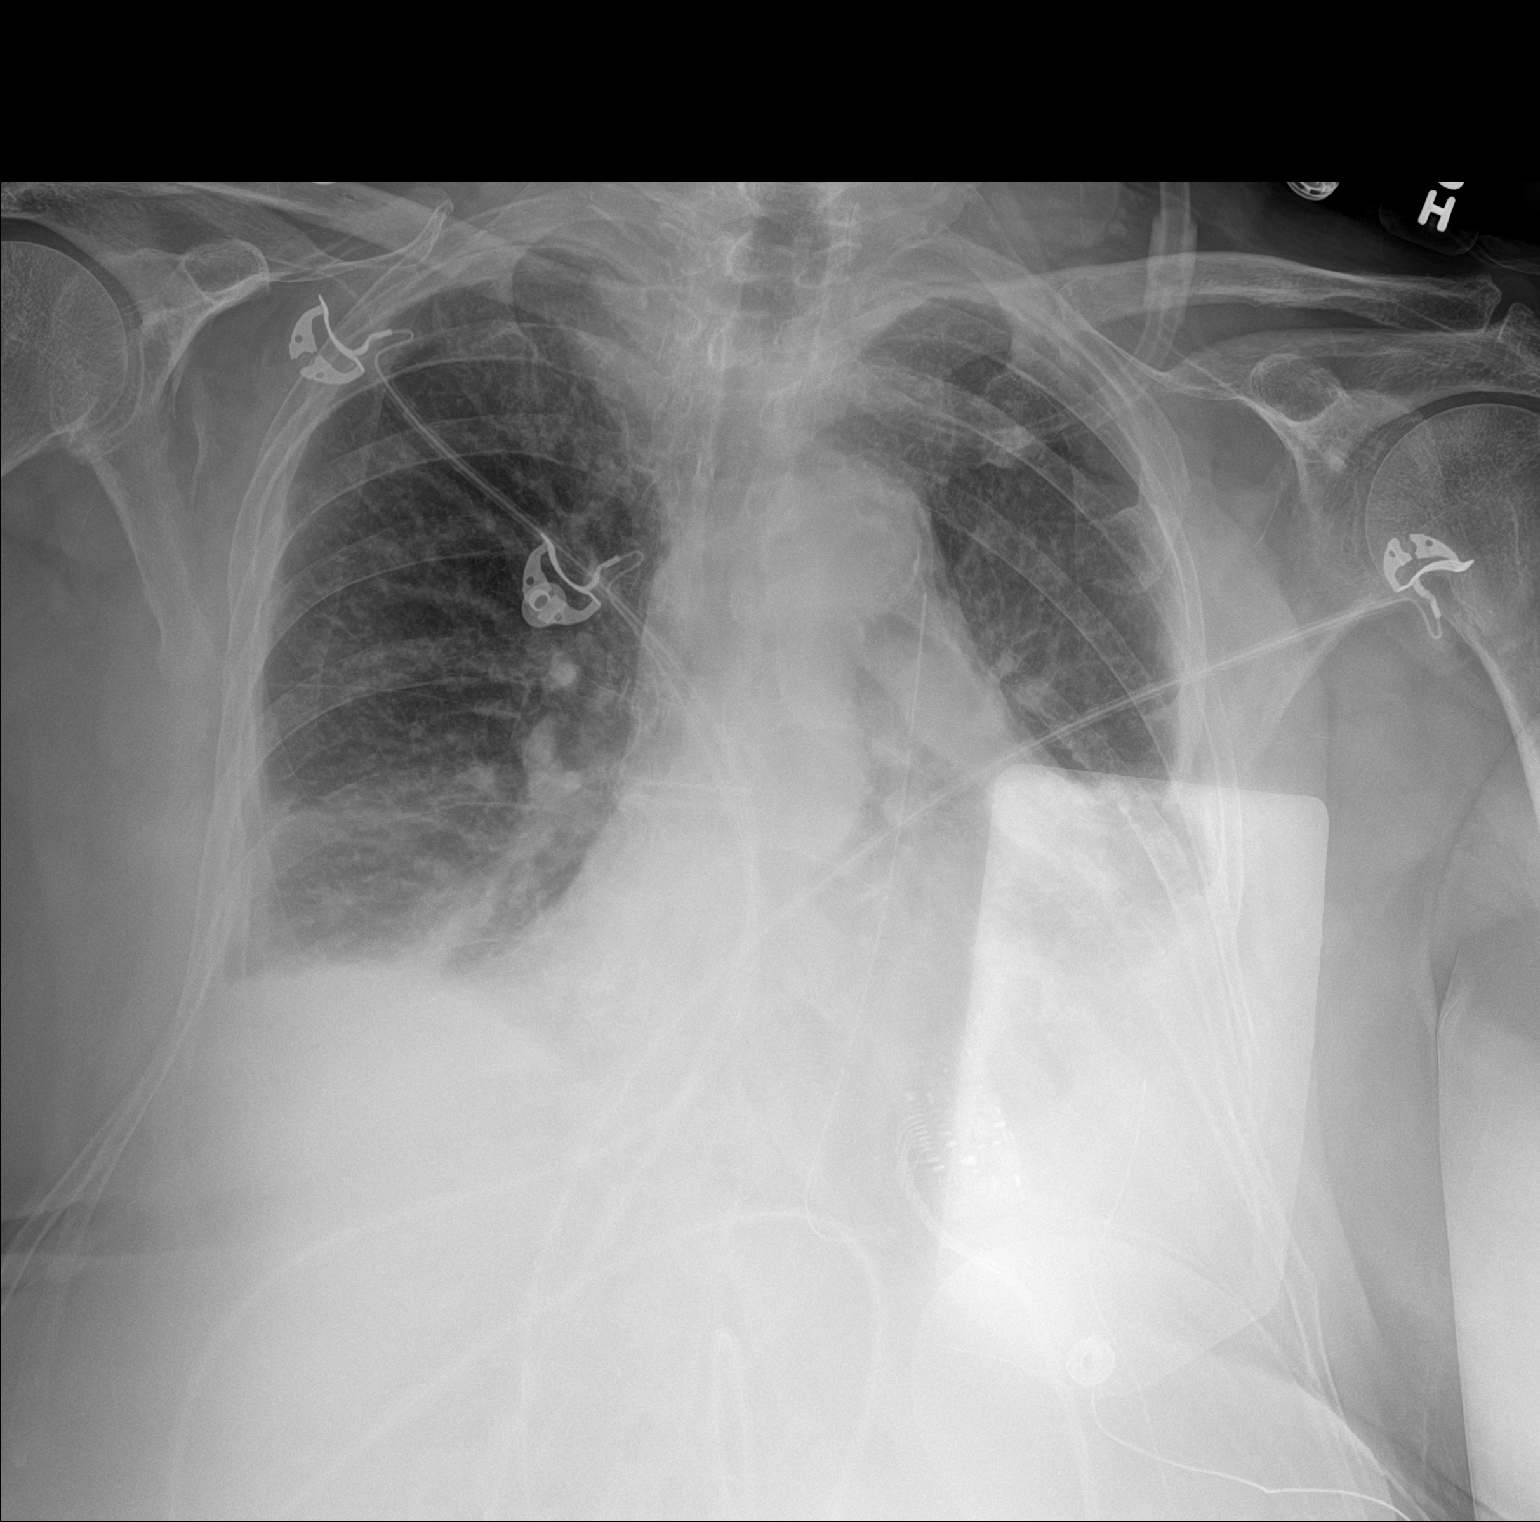

[1 of 1 positions shown; findings below may reference images not displayed]

FINDINGS: Left PleurX catheter in stable position. Cardiomegaly with pulmonary
venous congestion again noted without interim change. Low lung
volumes. Bibasilar infiltrates/edema again noted. Small unchanged
bilateral pleural effusions again noted. No pneumothorax.
IMPRESSION: 1.  Left PleurX catheter in stable position.  No pneumothorax.

2. Cardiomegaly with pulmonary venous congestion again noted without
interim change.

3. Low lung volumes. Bibasilar and infiltrates/edema again noted.
Small unchanged bilateral pleural effusions again noted.

## 2020-01-12 IMAGING — CR DG CHEST 2V
2 series · 2 of 2 positions shown · non-contrast
Comparison: 11/19/2018

CLINICAL DATA: Shortness of breath

EXAM:
CHEST - 2 VIEW

[chest lat]
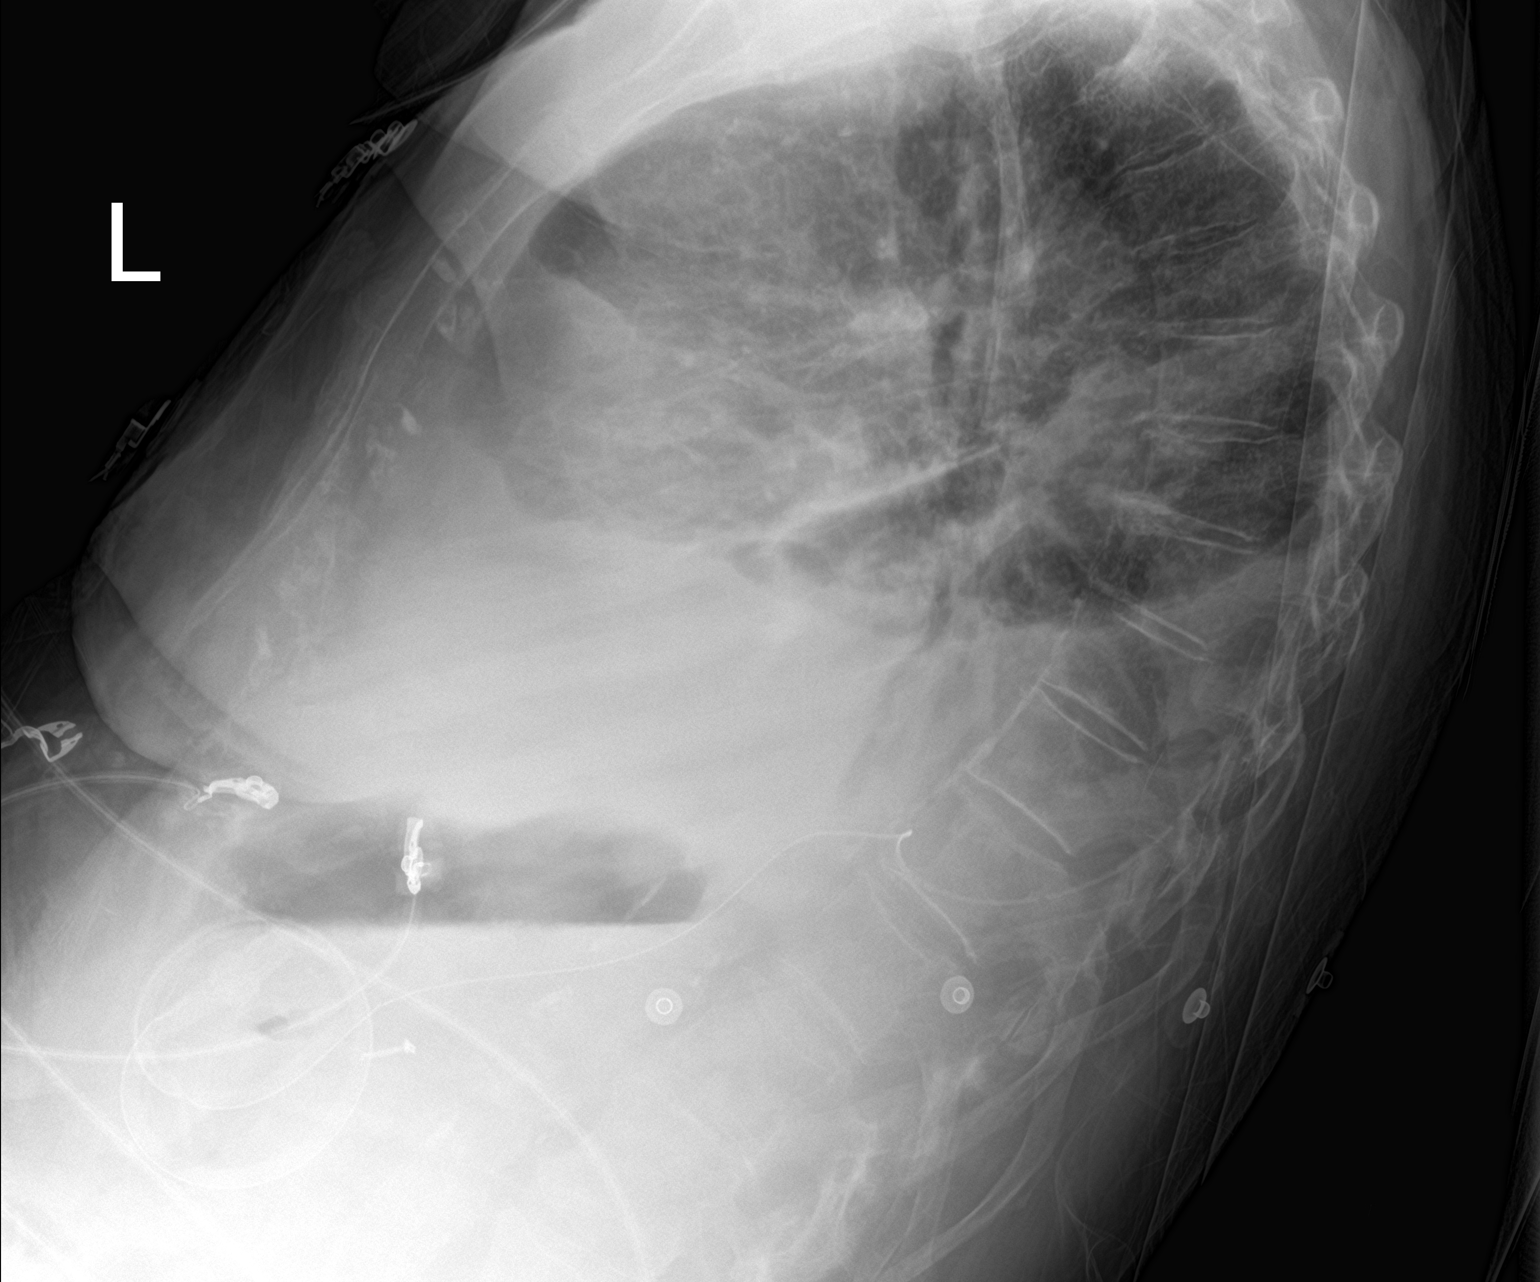

[chest ap]
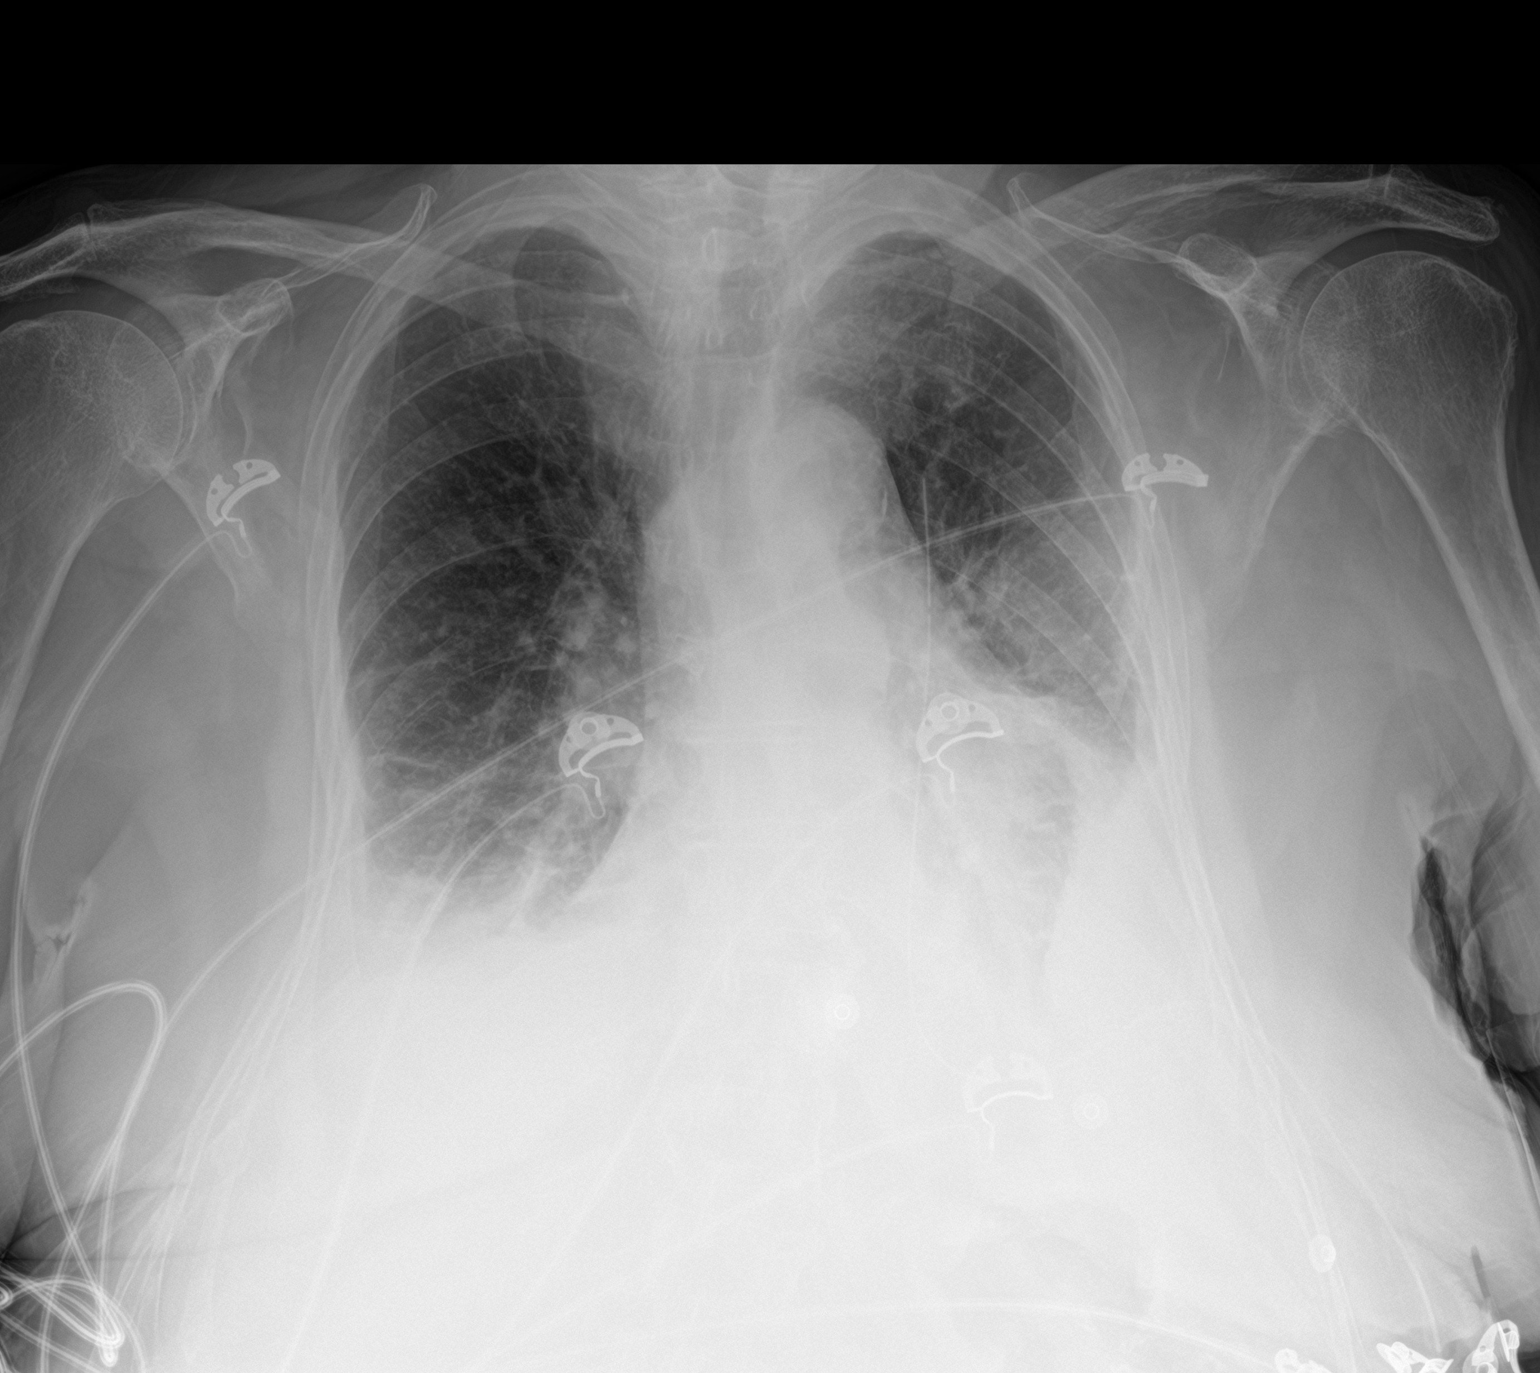

[2 of 2 positions shown; findings below may reference images not displayed]

FINDINGS: Cardiomegaly. Unchanged small bilateral pleural effusions and
associated atelectasis or consolidation with a left-sided tunneled
pleural drainage catheter. No new airspace opacity. The visualized
skeletal structures are unremarkable.
IMPRESSION: 1. Unchanged small bilateral pleural effusions and associated
atelectasis or consolidation with a left-sided tunneled pleural
drainage catheter. No new airspace opacity.

2.  Cardiomegaly.

## 2020-01-23 IMAGING — DX DG CHEST 2V
2 series · 2 of 2 positions shown · non-contrast
Comparison: 12/14/2018

CLINICAL DATA: History of community-acquired pneumonia.

EXAM:
CHEST - 2 VIEW

[chest lat]
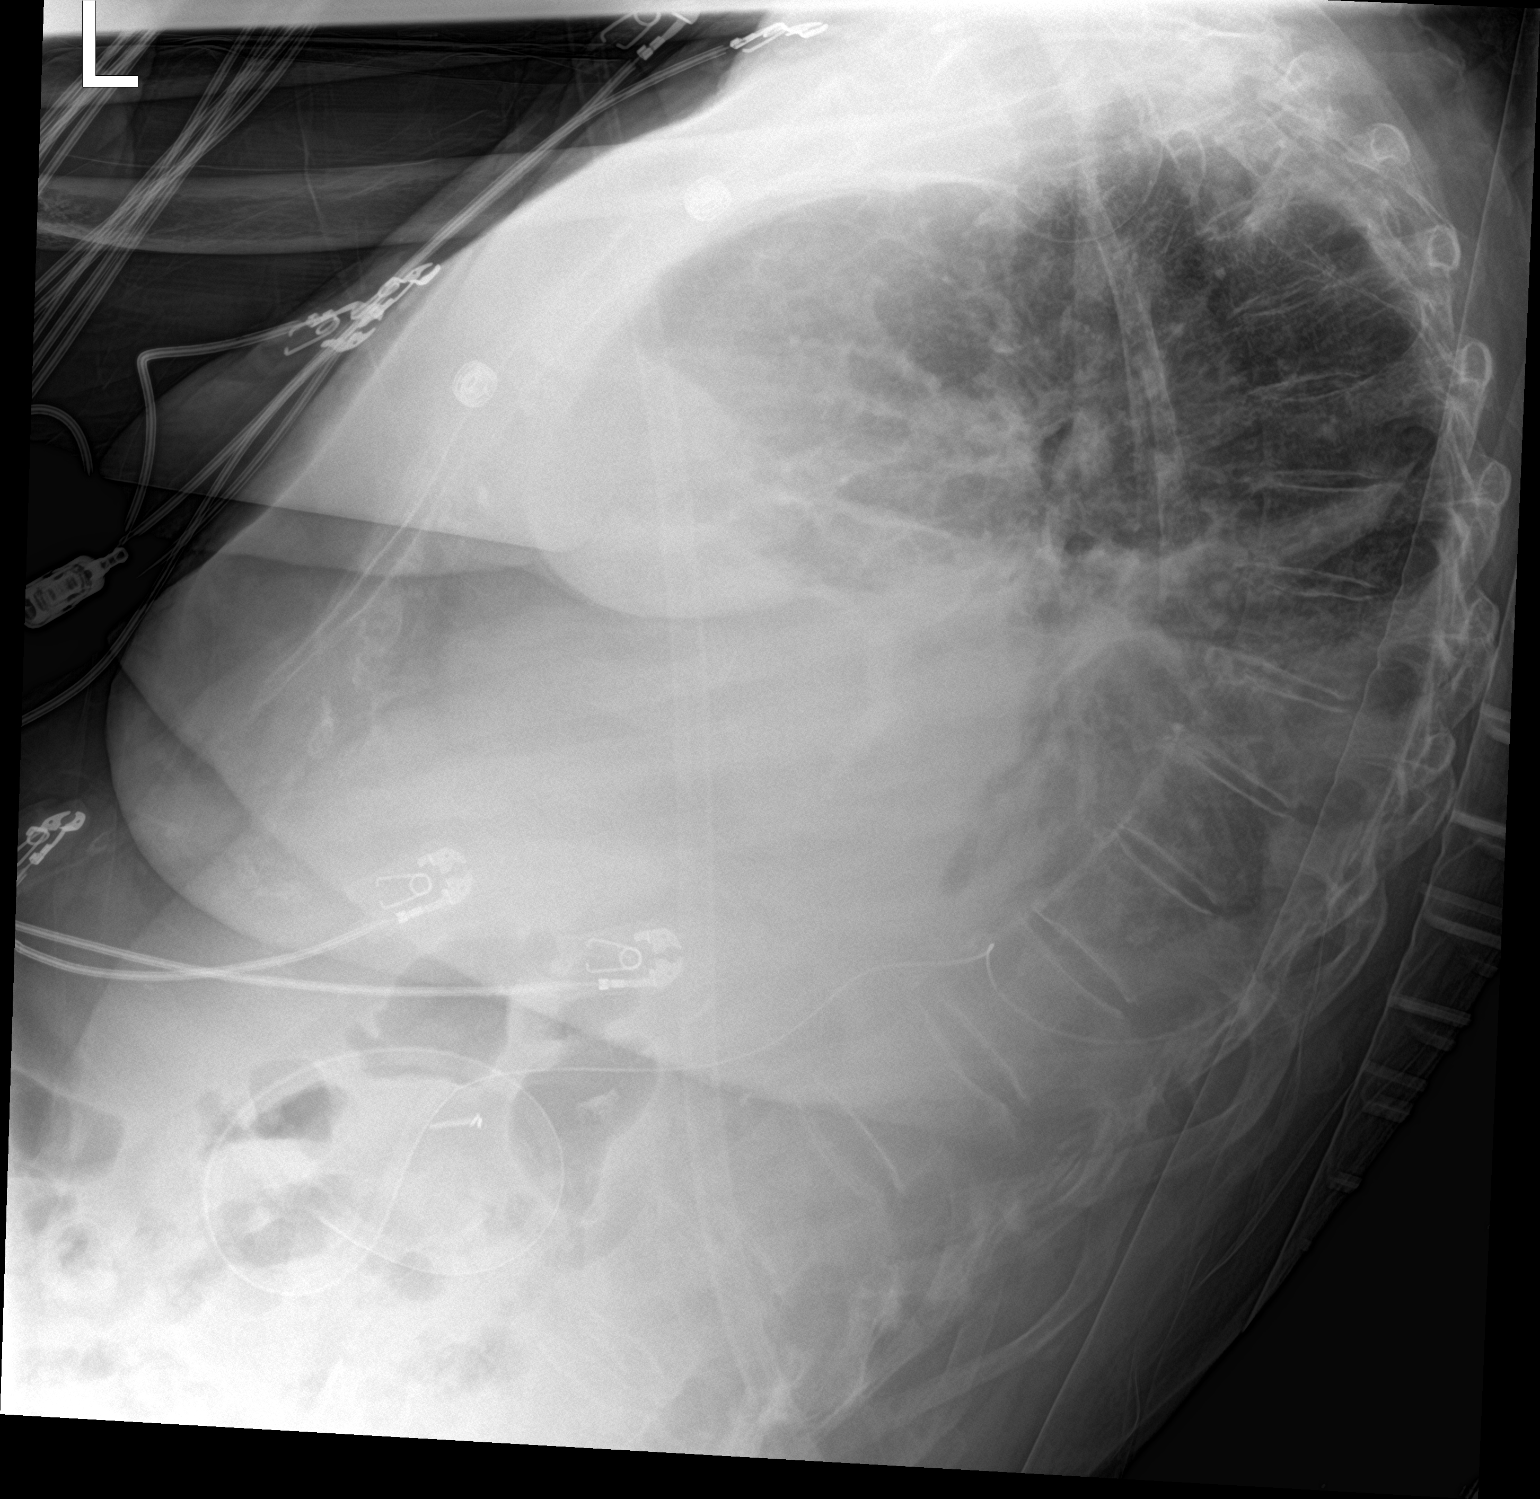

[chest ap]
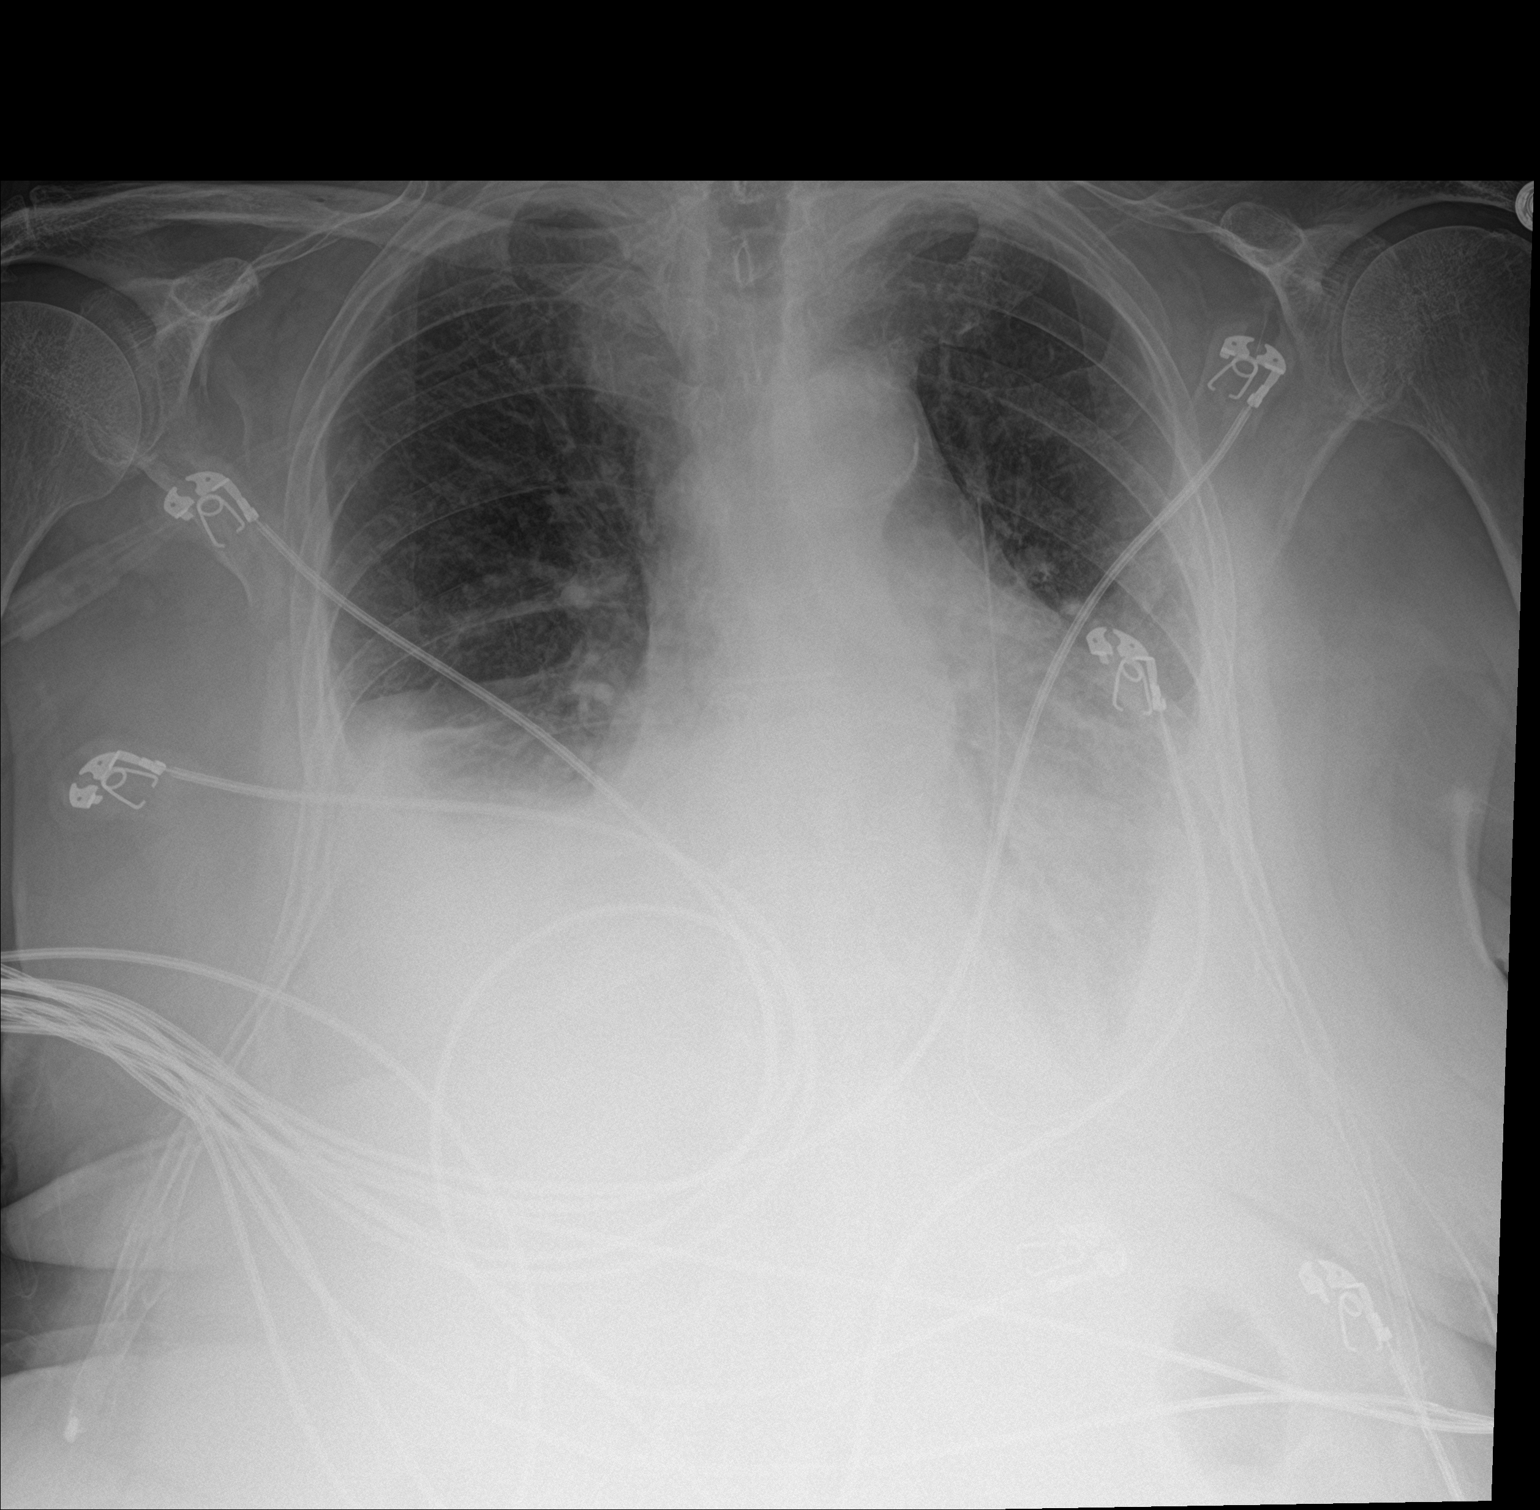

[2 of 2 positions shown; findings below may reference images not displayed]

FINDINGS: Cardiac shadow is enlarged but stable. Aortic calcifications are
seen. Left-sided PleurX catheter is again noted. Stable left pleural
effusion is seen. Slight increase in right-sided effusion when
compare with the prior study is noted. There is likely underlying
atelectasis/infiltrate present. No bony abnormality is seen.
IMPRESSION: Increasing right-sided pleural effusion when compare with the prior
study.
